# Patient Record
Sex: Male | Born: 1955 | Race: Black or African American | Hispanic: No | Marital: Married | Smoking: Never smoker
Health system: Southern US, Community
[De-identification: ages and names within clinical notes are randomized; demographics above are authoritative.]

## PROBLEM LIST (undated history)

## (undated) DIAGNOSIS — M1990 Unspecified osteoarthritis, unspecified site: Secondary | ICD-10-CM

## (undated) DIAGNOSIS — Z973 Presence of spectacles and contact lenses: Secondary | ICD-10-CM

## (undated) DIAGNOSIS — M4802 Spinal stenosis, cervical region: Secondary | ICD-10-CM

## (undated) DIAGNOSIS — C801 Malignant (primary) neoplasm, unspecified: Secondary | ICD-10-CM

## (undated) HISTORY — PX: BACK SURGERY: SHX140

## (undated) HISTORY — PX: HERNIA REPAIR: SHX51

## (undated) HISTORY — PX: ROTATOR CUFF REPAIR: SHX139

## (undated) HISTORY — PX: EYE SURGERY: SHX253

## (undated) HISTORY — PX: APPENDECTOMY: SHX54

---

## 2006-02-10 DIAGNOSIS — N529 Male erectile dysfunction, unspecified: Secondary | ICD-10-CM | POA: Insufficient documentation

## 2008-06-10 DIAGNOSIS — Z136 Encounter for screening for cardiovascular disorders: Secondary | ICD-10-CM | POA: Insufficient documentation

## 2009-03-28 DIAGNOSIS — N138 Other obstructive and reflux uropathy: Secondary | ICD-10-CM | POA: Insufficient documentation

## 2012-02-16 DIAGNOSIS — Z8042 Family history of malignant neoplasm of prostate: Secondary | ICD-10-CM | POA: Insufficient documentation

## 2012-02-16 DIAGNOSIS — E7800 Pure hypercholesterolemia, unspecified: Secondary | ICD-10-CM | POA: Insufficient documentation

## 2015-05-14 DIAGNOSIS — H40039 Anatomical narrow angle, unspecified eye: Secondary | ICD-10-CM | POA: Insufficient documentation

## 2016-08-17 DIAGNOSIS — C9000 Multiple myeloma not having achieved remission: Secondary | ICD-10-CM | POA: Insufficient documentation

## 2016-08-17 DIAGNOSIS — C9001 Multiple myeloma in remission: Secondary | ICD-10-CM | POA: Insufficient documentation

## 2016-08-24 DIAGNOSIS — G9520 Unspecified cord compression: Secondary | ICD-10-CM | POA: Insufficient documentation

## 2016-08-26 DIAGNOSIS — Z981 Arthrodesis status: Secondary | ICD-10-CM | POA: Insufficient documentation

## 2016-09-01 DIAGNOSIS — M4714 Other spondylosis with myelopathy, thoracic region: Secondary | ICD-10-CM | POA: Insufficient documentation

## 2017-02-05 DIAGNOSIS — Z9484 Stem cells transplant status: Secondary | ICD-10-CM | POA: Insufficient documentation

## 2017-05-08 ENCOUNTER — Other Ambulatory Visit: Payer: Self-pay

## 2017-05-08 ENCOUNTER — Encounter (HOSPITAL_COMMUNITY): Payer: Self-pay

## 2017-05-08 ENCOUNTER — Emergency Department (HOSPITAL_COMMUNITY)
Admission: EM | Admit: 2017-05-08 | Discharge: 2017-05-08 | Disposition: A | Discharge status: Home / Self Care | Payer: 59 | Attending: Emergency Medicine | Admitting: Emergency Medicine

## 2017-05-08 DIAGNOSIS — Z8579 Personal history of other malignant neoplasms of lymphoid, hematopoietic and related tissues: Secondary | ICD-10-CM | POA: Diagnosis not present

## 2017-05-08 DIAGNOSIS — R1084 Generalized abdominal pain: Secondary | ICD-10-CM

## 2017-05-08 DIAGNOSIS — R112 Nausea with vomiting, unspecified: Secondary | ICD-10-CM | POA: Insufficient documentation

## 2017-05-08 DIAGNOSIS — Z9484 Stem cells transplant status: Secondary | ICD-10-CM | POA: Insufficient documentation

## 2017-05-08 HISTORY — DX: Malignant (primary) neoplasm, unspecified: C80.1

## 2017-05-08 LAB — COMPREHENSIVE METABOLIC PANEL
ALBUMIN: 4 g/dL (ref 3.5–5.0)
ALK PHOS: 46 U/L (ref 38–126)
ALT: 13 U/L — AB (ref 17–63)
AST: 18 U/L (ref 15–41)
Anion gap: 6 (ref 5–15)
BILIRUBIN TOTAL: 0.6 mg/dL (ref 0.3–1.2)
BUN: 8 mg/dL (ref 6–20)
CALCIUM: 9.3 mg/dL (ref 8.9–10.3)
CO2: 27 mmol/L (ref 22–32)
CREATININE: 0.57 mg/dL — AB (ref 0.61–1.24)
Chloride: 105 mmol/L (ref 101–111)
GFR calc non Af Amer: >60 mL/min (ref >60)
GLUCOSE: 111 mg/dL — AB (ref 65–99)
Potassium: 4.4 mmol/L (ref 3.5–5.1)
SODIUM: 138 mmol/L (ref 135–145)
TOTAL PROTEIN: 6.4 g/dL — AB (ref 6.5–8.1)

## 2017-05-08 LAB — CBC
HCT: 34.5 % — ABNORMAL LOW (ref 39.0–52.0)
Hemoglobin: 11.3 g/dL — ABNORMAL LOW (ref 13.0–17.0)
MCH: 27.7 pg (ref 26.0–34.0)
MCHC: 32.8 g/dL (ref 30.0–36.0)
MCV: 84.6 fL (ref 78.0–100.0)
PLATELETS: 204 10*3/uL (ref 150–400)
RBC: 4.08 MIL/uL — ABNORMAL LOW (ref 4.22–5.81)
RDW: 16.2 % — ABNORMAL HIGH (ref 11.5–15.5)
WBC: 4.2 10*3/uL (ref 4.0–10.5)

## 2017-05-08 LAB — URINALYSIS, ROUTINE W REFLEX MICROSCOPIC
Bacteria, UA: NONE SEEN
Bilirubin Urine: NEGATIVE
GLUCOSE, UA: NEGATIVE mg/dL
Hgb urine dipstick: NEGATIVE
Ketones, ur: NEGATIVE mg/dL
Leukocytes, UA: NEGATIVE
Nitrite: NEGATIVE
PH: 7 (ref 5.0–8.0)
PROTEIN: NEGATIVE mg/dL
SPECIFIC GRAVITY, URINE: 1.01 (ref 1.005–1.030)
Squamous Epithelial / LPF: NONE SEEN

## 2017-05-08 LAB — LIPASE, BLOOD: Lipase: 23 U/L (ref 11–51)

## 2017-05-08 MED ORDER — ONDANSETRON HCL 4 MG/2ML IJ SOLN
4.0000 mg | Freq: Once | INTRAMUSCULAR | Status: AC
  Administered 2017-05-08: 4 mg via INTRAVENOUS
  Filled 2017-05-08: qty 2

## 2017-05-08 MED ORDER — ONDANSETRON HCL 4 MG PO TABS: 4.0000 mg | ORAL_TABLET | Freq: Three times a day (TID) | ORAL | 0 refills | Status: DC | PRN

## 2017-05-08 MED ORDER — SODIUM CHLORIDE 0.9 % IV SOLN
INTRAVENOUS | Status: DC
  Administered 2017-05-08: 21:00:00 via INTRAVENOUS

## 2017-05-08 NOTE — ED Notes (Addendum)
Pt was given ice water for fluid challenge. Tolerated well. Denies N/V. Provider notified.

## 2017-05-08 NOTE — ED Triage Notes (Signed)
Patient c/o mid abdominal pain and vomiting since 1300 today. Patient has a history of multiple myeloma. Patient denies any diarrhea.

## 2017-05-08 NOTE — ED Provider Notes (Signed)
[facility] DEPT Provider Note   CSN: [id number] Arrival date & time: 05/08/17  6803     History   Chief Complaint Chief Complaint  Patient presents with  . Abdominal Pain  . Emesis    HPI   Blood pressure 126/81, pulse 65, temperature 98.6 F (37 C), temperature source Oral, resp. rate 18, height 5' 7.5" (1.715 m), weight 53.5 kg (118 lb), SpO2 100 %.  Jesus [name] is a 62 y.o. 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  No pain medications taken prior to arrival.  Past Medical History:  Diagnosis Date  . Cancer ([place])     There are no active problems to display for this patient.   Past Surgical History:  Procedure Laterality Date  . BACK SURGERY          Home Medications    Prior to Admission medications   Medication Sig Start Date End Date Taking? Authorizing Provider  ondansetron (ZOFRAN) 4 MG tablet Take 1 tablet (4 mg total) by mouth every 8 (eight) hours as needed for nausea or vomiting. 05/08/17   Jazon Jipson, Charna Elizabeth    Family History No family history on file.  Social History Social History   Tobacco Use  . Smoking status: Never Smoker  . Smokeless tobacco: Never Used  Substance Use Topics  . Alcohol use: Never    Frequency: Never  . Drug use: Never     Allergies   Penicillins   Review of Systems Review of Systems  A complete review of systems was obtained and all systems are negative except as noted in the HPI and PMH.   Physical Exam Updated Vital Signs BP 136/84 (BP Location: Left Arm)   Pulse 82   Temp 97.9 F (36.6 C) (Oral)   Resp 18   Ht 5' 7.5" (1.715 m)   Wt 53.5 kg (118 lb)   SpO2  100%   BMI 18.21 kg/m   Physical Exam  Constitutional: He is oriented to person, place, and time. He appears well-developed and well-nourished. No distress.  HENT:  Head: Normocephalic and atraumatic.  Mouth/Throat: Oropharynx is clear and moist.  Eyes: Pupils are equal, round, and reactive to light. Conjunctivae and EOM are normal.  Neck: Normal range of motion.  Cardiovascular: Normal rate, regular rhythm and intact distal pulses.  Pulmonary/Chest: Effort normal and breath sounds normal.  Abdominal: Soft. He exhibits no distension and no mass. There is no tenderness. There is no rebound and no guarding. No hernia.  No tenderness to deep palpation of any quadrant, no guarding or rebound.  Musculoskeletal: Normal range of motion.  Neurological: He is alert and oriented to person, place, and time.  Skin: He is not diaphoretic.  Psychiatric: He has a normal mood and affect.  Nursing note and vitals reviewed.    ED Treatments / Results  Labs (all labs ordered are listed, but only abnormal results are displayed) Labs Reviewed  COMPREHENSIVE METABOLIC PANEL - Abnormal; Notable for the following components:      Result Value   Glucose, Bld 111 (*)    Creatinine, Ser 0.57 (*)    Total Protein 6.4 (*)    ALT 13 (*)    All other components within normal limits  CBC - Abnormal; Notable for the following components:   RBC 4.08 (*)    Hemoglobin 11.3 (*)    HCT 34.5 (*)    RDW 16.2 (*)    All other components within normal limits  LIPASE, BLOOD  URINALYSIS, ROUTINE W REFLEX MICROSCOPIC    EKG None  Radiology No results found.  Procedures Procedures (including critical care time)  Medications Ordered in ED Medications  0.9 %  sodium chloride infusion ( Intravenous New Bag/Given 05/08/17 2051)  ondansetron (ZOFRAN) injection 4 mg (4 mg Intravenous Given 05/08/17 2051)     Initial Impression / Assessment and Plan / ED Course  I have reviewed the triage vital signs and the  nursing notes.  Pertinent labs & imaging results that were available during my care of the patient were reviewed by me and considered in my medical decision making (see chart for details).     Vitals:   05/08/17 1857 05/08/17 1901 05/08/17 2005 05/08/17 2128  BP: 126/81  133/82 136/84  Pulse: 65  83 82  Resp: _0 Temp: 98.6 F (37 C)   97.9 F (36.6 C)  TempSrc: Oral   Oral  SpO2: 100%  100% 100%  Weight:  53.5 kg (118 lb)    Height:  5' 7.5" (1.715 m)      Medications  0.9 %  sodium chloride infusion ( Intravenous New Bag/Given 05/08/17 2051)  ondansetron (ZOFRAN) injection 4 mg (4 mg Intravenous Given 05/08/17 2051)    Jesus [name] is 62 y.o. male presenting with acute onset of diffuse abdominal pain with single episode of emesis.  Patient is very worried because he underwent bone marrow transplant last year.  Abdominal exam is benign, patient afebrile, nontoxic-appearing, blood work reassuring, he is tolerating p.o.'s.  Repeat abdominal exam remains benign.  Evaluation does not show pathology that would require ongoing emergent intervention or inpatient treatment. Pt is hemodynamically stable and mentating appropriately. Discussed findings and plan with patient/guardian, who agrees with care plan. All questions answered. Return precautions discussed and outpatient follow up given.    Final Clinical Impressions(s) / ED Diagnoses   Final diagnoses:  Non-intractable vomiting with nausea, unspecified vomiting type  Generalized abdominal pain    ED Discharge Orders        Ordered    ondansetron (ZOFRAN) 4 MG tablet  Every 8 hours PRN     05/08/17 2129       Mackinsey Pelland, Charna Elizabeth 05/08/17 2132    Lajean Saver, MD 05/08/17 2356

## 2017-05-08 NOTE — Discharge Instructions (Addendum)

## 2018-04-19 ENCOUNTER — Telehealth: Payer: Self-pay | Admitting: Hematology

## 2018-04-19 NOTE — Progress Notes (Signed)
HEMATOLOGY/ONCOLOGY CONSULTATION NOTE  Date of Service: 04/20/2018  Patient Care Team: Patient, No Pcp Per as PCP - General (General Practice)  CHIEF COMPLAINTS/PURPOSE OF CONSULTATION:  Multiple Myeloma  Oncologic History:   Jesus [name] was diagnosed with multiple myeloma in July 2018 in [place]. He initially developed mid back pain in May 2018 and was seen to have a T10 vertebral body mass with additional lytic lesion at T9. His initial M spike in July 2018 was 1.5g with IgG Lambda specificity and IgG elevated at '2220mg'$ . His July 2018 BM Bx revealed 20% monoclonal plasma cells and FISH revealed a 17p deletion. He received 4 fractions of palliative RT, however his back pain worsened and developed bilateral lower extremity numbness and tingling, and was then seen to have a T10 pathologic fracture with cord compression s/p T10 corpectomy. The pt began VRd on 09/24/16, completed 7 cycles, then began autologous transplant at the [facility] with Day 0 on 02/23/17, post transplant complicated by neutropenic colitis and deconditioning. Repeat BM Bx on 04/21/17 revealed residual plasma cell with less than 5% lambda light chain restricted plasma cells, and a post-transplant M spike of 0.4g. The pt then began maintenance '56mg'$ /m2 Carfilzomib every 2 weeks on 06/16/17.  HISTORY OF PRESENTING ILLNESS:   Jesus [name] is a wonderful 63 y.o. male who has been referred to [place] by Dr. Phil Dopp for evaluation and management of Multiple Myeloma. The pt reports that he is doing well overall.  The pt has been receiving all of his care and treatment thus far in [place]. He works for [employer] and has been based out of [place], however due to the novel coronavirus, the pt has moved back to his home which is here locally, and is transferring his care here now as well. The pt notes that he is anticipating C11D1 maintenance Carfilzomib today. The pt notes that his most recent M spike was 0.2g. His  last BM biopsy was March 2019, as noted above. He denies any infection issues in the last year. The pt notes that he has received all of is post-transplant vaccinations. He last saw his transplant center, the [facility], in December 2019.  The pt reports that he continues to have back pain. He has been receiving Zometa every 4 weeks, but was recently transitioned to every 3 months, he denies dental concerns at this time. He is taking Calcium and Vitamin D replacement.   The pt has been taking Marinol for appetite stimulation.   The pt notes that he has an enlarged prostate, which was seen on imaging. He notes that he was expecting to receive a PSA test soon.  The pt notes that prior to his Multiple Myeloma diagnosis in July 2018, he had a "clean bill of health." He denies DM, HTN, lung problems, heart problems, thyroid problems or other medical concerns.  Most recent lab results (03/23/18) of CBC is as follows: all values are WNL except for RBC at 4.03, HGB at 11.5, HCT at 35.6%, MPV at 8.9, Glucose at 135, Total Protein at 6.2.  On review of systems, pt reports chronic back pain, stable energy levels, and denies concerns for infections, new pain along the spine, abdominal pains, leg swelling, and any other symptoms.   On PMHx the pt reports Multiple Myeloma, denies HTN, HLD, lung problems, strokes, seizures, heart problems and thyroid problems. On Social Hx the pt reports working for the [employer].  MEDICAL HISTORY:  Past Medical History:  Diagnosis Date  .  Cancer [facility])     SURGICAL HISTORY: Past Surgical History:  Procedure Laterality Date  . BACK SURGERY      SOCIAL HISTORY: Social History   Socioeconomic History  . Marital status: Single    Spouse name: Not on file  . Number of children: Not on file  . Years of education: Not on file  . Highest education level: Not on file  Occupational History  . Not on file  Social Needs  . Financial resource strain: Not on file   . Food insecurity:    Worry: Not on file    Inability: Not on file  . Transportation needs:    Medical: Not on file    Non-medical: Not on file  Tobacco Use  . Smoking status: Never Smoker  . Smokeless tobacco: Never Used  Substance and Sexual Activity  . Alcohol use: Never    Frequency: Never  . Drug use: Never  . Sexual activity: Not on file  Lifestyle  . Physical activity:    Days per week: Not on file    Minutes per session: Not on file  . Stress: Not on file  Relationships  . Social connections:    Talks on phone: Not on file    Gets together: Not on file    Attends religious service: Not on file    Active member of club or organization: Not on file    Attends meetings of clubs or organizations: Not on file    Relationship status: Not on file  . Intimate partner violence:    Fear of current or ex partner: Not on file    Emotionally abused: Not on file    Physically abused: Not on file    Forced sexual activity: Not on file  Other Topics Concern  . Not on file  Social History Narrative  . Not on file    FAMILY HISTORY: No family history on file.  ALLERGIES:  is allergic to penicillins.  MEDICATIONS:  Current Outpatient Medications  Medication Sig Dispense Refill  . ondansetron (ZOFRAN) 4 MG tablet Take 1 tablet (4 mg total) by mouth every 8 (eight) hours as needed for nausea or vomiting. 10 tablet 0   No current facility-administered medications for this visit.     REVIEW OF SYSTEMS:    10 Point review of Systems was done is negative except as noted above.  PHYSICAL EXAMINATION: ECOG PERFORMANCE STATUS: 1 - Symptomatic but completely ambulatory  . Vitals:   04/20/18 1018  BP: 138/85  Pulse: 75  Resp: 17  Temp: 98.6 F (37 C)  SpO2: 100%   Filed Weights   04/20/18 1018  Weight: 127 lb 11.2 oz (57.9 kg)   .Body mass index is 19.71 kg/m.  GENERAL:alert, in no acute distress and comfortable SKIN: no acute rashes, no significant lesions  EYES: conjunctiva are pink and non-injected, sclera anicteric OROPHARYNX: MMM, no exudates, no oropharyngeal erythema or ulceration NECK: supple, no JVD LYMPH:  no palpable lymphadenopathy in the cervical, axillary or inguinal regions LUNGS: clear to auscultation b/l with normal respiratory effort HEART: regular rate & rhythm ABDOMEN:  normoactive bowel sounds , non tender, not distended. Extremity: no pedal edema PSYCH: alert & oriented x 3 with fluent speech NEURO: no focal motor/sensory deficits  LABORATORY DATA:  I have reviewed the data as listed  . CBC Latest Ref Rng & Units 04/25/2018 04/20/2018 05/08/2017  WBC 4.0 - 10.5 K/uL 4.1 4.6 4.2  Hemoglobin 13.0 - 17.0 g/dL 11.6(L) 12.4(L)  11.3(L)  Hematocrit 39.0 - 52.0 % 36.9(L) 38.1(L) 34.5(L)  Platelets 150 - 400 K/uL 193 216 204    . CMP Latest Ref Rng & Units 04/25/2018 04/20/2018 05/08/2017  Glucose 70 - 99 mg/dL 105(H) 95 111(H)  BUN 8 - 23 mg/dL '14 12 8  '$ Creatinine 0.61 - 1.24 mg/dL 0.70 0.72 0.57(L)  Sodium 135 - 145 mmol/L 143 141 138  Potassium 3.5 - 5.1 mmol/L 3.9 4.1 4.4  Chloride 98 - 111 mmol/L 107 105 105  CO2 22 - 32 mmol/L '26 26 27  '$ Calcium 8.9 - 10.3 mg/dL 9.4 9.4 9.3  Total Protein 6.5 - 8.1 g/dL 6.7 7.3 6.4(L)  Total Bilirubin 0.3 - 1.2 mg/dL 0.5 0.9 0.6  Alkaline Phos 38 - 126 U/L 62 78 46  AST 15 - 41 U/L 14(L) 15 18  ALT 0 - 44 U/L 19 22 13(L)     RADIOGRAPHIC STUDIES: I have personally reviewed the radiological images as listed and agreed with the findings in the report. No results found.  ASSESSMENT & PLAN:  63 y.o. male with  1. 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 IgG at 2220. 08/11/16 PET/CT revealed Hypermetabolic large soft tissue mass in the lower thoracic paraspinal region associated with lytic  destruction of T10 vertebral body, concerning for multiple myeloma. Additional smaller lytic lesions involving the skeleton. S/p RT x 4 fractions, discontinued due to T10 pathologic fracture with severe cord compression S/p 08/26/16 T10 corpectomy and T8-L1 posterior spinal fusion   Began 7 cycles of VRd on 09/24/16 S/p autologous stem cell transplant, Day 0 on 02/23/17 04/21/17 BM Bx with residual less than 5% lambda light chain restricted plasma cells in the bone marrow. M Spike at 0.4g Began maintenance '56mg'$ /m2 Carfilzomib every 2 weeks on 515/19  PLAN: -Discussed patient's most recent labs from 03/23/18, some mild anemia with HGB at 11.5 -Will plan to begin C11 maintenance Carfilzomib treatment as soon as this is approved, tentatively within one week -Discussed that I recommend the patient establishing care with a transplant center locally, in case he does not return to [place] soon, and would recommend Fed[place] continue Zometa every 3 months, beginning in late April, when the pt's next dose is due -Advised that pt establish care with a PCP locally -Will order labs today -Will see the pt back in 4-5 weeks   Labs today Carfilzomib q2weeks to start ASAP - plz schedule 4 doses Zometa q14month to start in 4 weeks RTC with Dr KIrene Limbowith 2nd dose of Carfilzomib Labs with each dose of Carfilzomib   All of the patients questions were answered with apparent satisfaction. The patient knows to call the clinic with any problems, questions or concerns.  The total time spent in the appt was 60 minutes and more than 50% was on counseling and direct patient cares.    GSullivan LoneMD MS AAHIVMS SEndoscopy Center Of Washington Dc LPCTri City Surgery Center LLCHematology/Oncology Physician CEncompass Health Rehabilitation [facility] Chattano[facility] (Office):       [phone](Work cell):  [phone](Fax):           [phone] 04/20/2018 11:34 AM  I, SBaldwin Jamaica am acting as a scribe for Dr. GSullivan Lone   .I have reviewed the above  documentation for accuracy and completeness, and I agree with the above. .[name]  Brunetta Genera MD

## 2018-04-19 NOTE — Telephone Encounter (Signed)
A new patient appt has been schedulef for the pt to see Dr. Irene Limbo tomorrow, 3/18 at 1020am. Jesus [name] agreed to the appt date and time.

## 2018-04-20 ENCOUNTER — Other Ambulatory Visit: Payer: Self-pay | Admitting: *Deleted

## 2018-04-20 ENCOUNTER — Inpatient Hospital Stay: Payer: 59 | Attending: Hematology | Admitting: Hematology

## 2018-04-20 ENCOUNTER — Telehealth: Payer: Self-pay | Admitting: Hematology

## 2018-04-20 ENCOUNTER — Inpatient Hospital Stay: Payer: 59

## 2018-04-20 ENCOUNTER — Other Ambulatory Visit: Payer: Self-pay

## 2018-04-20 VITALS — BP 138/85 | HR 75 | Temp 98.6°F | Resp 17 | Ht 67.5 in | Wt 127.7 lb

## 2018-04-20 DIAGNOSIS — Z5112 Encounter for antineoplastic immunotherapy: Secondary | ICD-10-CM | POA: Diagnosis present

## 2018-04-20 DIAGNOSIS — D649 Anemia, unspecified: Secondary | ICD-10-CM | POA: Diagnosis not present

## 2018-04-20 DIAGNOSIS — C9000 Multiple myeloma not having achieved remission: Secondary | ICD-10-CM | POA: Insufficient documentation

## 2018-04-20 DIAGNOSIS — G8929 Other chronic pain: Secondary | ICD-10-CM | POA: Diagnosis not present

## 2018-04-20 DIAGNOSIS — Z79899 Other long term (current) drug therapy: Secondary | ICD-10-CM | POA: Insufficient documentation

## 2018-04-20 DIAGNOSIS — N400 Enlarged prostate without lower urinary tract symptoms: Secondary | ICD-10-CM

## 2018-04-20 DIAGNOSIS — Z7189 Other specified counseling: Secondary | ICD-10-CM | POA: Insufficient documentation

## 2018-04-20 LAB — CBC WITH DIFFERENTIAL/PLATELET
ABS IMMATURE GRANULOCYTES: 0.02 10*3/uL (ref 0.00–0.07)
Basophils Absolute: 0 10*3/uL (ref 0.0–0.1)
Basophils Relative: 0 %
EOS PCT: 1 %
Eosinophils Absolute: 0.1 10*3/uL (ref 0.0–0.5)
HCT: 38.1 % — ABNORMAL LOW (ref 39.0–52.0)
HEMOGLOBIN: 12.4 g/dL — AB (ref 13.0–17.0)
Immature Granulocytes: 0 %
Lymphocytes Relative: 34 %
Lymphs Abs: 1.6 10*3/uL (ref 0.7–4.0)
MCH: 29.5 pg (ref 26.0–34.0)
MCHC: 32.5 g/dL (ref 30.0–36.0)
MCV: 90.7 fL (ref 80.0–100.0)
MONO ABS: 0.5 10*3/uL (ref 0.1–1.0)
MONOS PCT: 11 %
NEUTROS ABS: 2.5 10*3/uL (ref 1.7–7.7)
Neutrophils Relative %: 54 %
Platelets: 216 10*3/uL (ref 150–400)
RBC: 4.2 MIL/uL — ABNORMAL LOW (ref 4.22–5.81)
RDW: 15.7 % — ABNORMAL HIGH (ref 11.5–15.5)
WBC: 4.6 10*3/uL (ref 4.0–10.5)
nRBC: 0 % (ref 0.0–0.2)

## 2018-04-20 LAB — CMP (CANCER CENTER ONLY)
ALBUMIN: 4.5 g/dL (ref 3.5–5.0)
ALK PHOS: 78 U/L (ref 38–126)
ALT: 22 U/L (ref 0–44)
AST: 15 U/L (ref 15–41)
Anion gap: 10 (ref 5–15)
BILIRUBIN TOTAL: 0.9 mg/dL (ref 0.3–1.2)
BUN: 12 mg/dL (ref 8–23)
CALCIUM: 9.4 mg/dL (ref 8.9–10.3)
CO2: 26 mmol/L (ref 22–32)
CREATININE: 0.72 mg/dL (ref 0.61–1.24)
Chloride: 105 mmol/L (ref 98–111)
GFR, Est AFR Am: >60 mL/min (ref >60)
GFR, Estimated: >60 mL/min (ref >60)
GLUCOSE: 95 mg/dL (ref 70–99)
Potassium: 4.1 mmol/L (ref 3.5–5.1)
Sodium: 141 mmol/L (ref 135–145)
TOTAL PROTEIN: 7.3 g/dL (ref 6.5–8.1)

## 2018-04-20 LAB — SAMPLE TO BLOOD BANK

## 2018-04-20 LAB — SEDIMENTATION RATE: Sed Rate: 8 mm/hr (ref 0–16)

## 2018-04-20 MED ORDER — ONDANSETRON HCL 4 MG PO TABS: 4.0000 mg | ORAL_TABLET | Freq: Three times a day (TID) | ORAL | 0 refills | Status: DC | PRN

## 2018-04-20 NOTE — Progress Notes (Signed)
START OFF PATHWAY REGIMEN - Multiple Myeloma and Other Plasma Cell Dyscrasias   [id number]:Carfilzomib 20/56 mg/m2 Monotherapy q28 Days:   A cycle is every 28 days:     Carfilzomib      Carfilzomib      Carfilzomib      Carfilzomib   **Always confirm dose/schedule in your pharmacy ordering system**  Patient Characteristics: Maintenance Therapy R-ISS Staging: III Disease Classification: Maintenance Therapy Intent of Therapy: Non-Curative / Palliative Intent, Discussed with Patient

## 2018-04-20 NOTE — Telephone Encounter (Signed)
Scheduled appt per 3/18 los. ° °Printed calendar and avs. °

## 2018-04-21 LAB — MULTIPLE MYELOMA PANEL, SERUM
ALBUMIN/GLOB SERPL: 1.7 (ref 0.7–1.7)
Albumin SerPl Elph-Mcnc: 4.2 g/dL (ref 2.9–4.4)
Alpha 1: 0.3 g/dL (ref 0.0–0.4)
Alpha2 Glob SerPl Elph-Mcnc: 0.6 g/dL (ref 0.4–1.0)
B-Globulin SerPl Elph-Mcnc: 1.1 g/dL (ref 0.7–1.3)
GAMMA GLOB SERPL ELPH-MCNC: 0.7 g/dL (ref 0.4–1.8)
GLOBULIN, TOTAL: 2.6 g/dL (ref 2.2–3.9)
IGA: 60 mg/dL — AB (ref 61–437)
IgG (Immunoglobin G), Serum: 712 mg/dL (ref 700–1600)
IgM (Immunoglobulin M), Srm: 83 mg/dL (ref 20–172)
M Protein SerPl Elph-Mcnc: 0.2 g/dL — ABNORMAL HIGH
Total Protein ELP: 6.8 g/dL (ref 6.0–8.5)

## 2018-04-21 LAB — KAPPA/LAMBDA LIGHT CHAINS
KAPPA FREE LGHT CHN: 10.2 mg/L (ref 3.3–19.4)
KAPPA, LAMDA LIGHT CHAIN RATIO: 1.1 (ref 0.26–1.65)
LAMDA FREE LIGHT CHAINS: 9.3 mg/L (ref 5.7–26.3)

## 2018-04-21 LAB — VITAMIN D 25 HYDROXY (VIT D DEFICIENCY, FRACTURES): VIT D 25 HYDROXY: 22.1 ng/mL — AB (ref 30.0–100.0)

## 2018-04-22 ENCOUNTER — Other Ambulatory Visit: Payer: Self-pay

## 2018-04-22 DIAGNOSIS — C9000 Multiple myeloma not having achieved remission: Secondary | ICD-10-CM

## 2018-04-25 ENCOUNTER — Inpatient Hospital Stay: Payer: 59

## 2018-04-25 ENCOUNTER — Other Ambulatory Visit: Payer: Self-pay

## 2018-04-25 VITALS — BP 136/76 | HR 92 | Temp 98.7°F | Resp 17

## 2018-04-25 DIAGNOSIS — Z7189 Other specified counseling: Secondary | ICD-10-CM

## 2018-04-25 DIAGNOSIS — C9000 Multiple myeloma not having achieved remission: Secondary | ICD-10-CM

## 2018-04-25 DIAGNOSIS — Z5112 Encounter for antineoplastic immunotherapy: Secondary | ICD-10-CM | POA: Diagnosis not present

## 2018-04-25 LAB — CMP (CANCER CENTER ONLY)
ALK PHOS: 62 U/L (ref 38–126)
ALT: 19 U/L (ref 0–44)
AST: 14 U/L — ABNORMAL LOW (ref 15–41)
Albumin: 4.1 g/dL (ref 3.5–5.0)
Anion gap: 10 (ref 5–15)
BUN: 14 mg/dL (ref 8–23)
CO2: 26 mmol/L (ref 22–32)
Calcium: 9.4 mg/dL (ref 8.9–10.3)
Chloride: 107 mmol/L (ref 98–111)
Creatinine: 0.7 mg/dL (ref 0.61–1.24)
GFR, Est AFR Am: >60 mL/min (ref >60)
GFR, Estimated: >60 mL/min (ref >60)
Glucose, Bld: 105 mg/dL — ABNORMAL HIGH (ref 70–99)
Potassium: 3.9 mmol/L (ref 3.5–5.1)
Sodium: 143 mmol/L (ref 135–145)
Total Bilirubin: 0.5 mg/dL (ref 0.3–1.2)
Total Protein: 6.7 g/dL (ref 6.5–8.1)

## 2018-04-25 LAB — CBC WITH DIFFERENTIAL (CANCER CENTER ONLY)
Abs Immature Granulocytes: 0.01 10*3/uL (ref 0.00–0.07)
Basophils Absolute: 0 10*3/uL (ref 0.0–0.1)
Basophils Relative: 1 %
Eosinophils Absolute: 0.1 10*3/uL (ref 0.0–0.5)
Eosinophils Relative: 1 %
HCT: 36.9 % — ABNORMAL LOW (ref 39.0–52.0)
Hemoglobin: 11.6 g/dL — ABNORMAL LOW (ref 13.0–17.0)
Immature Granulocytes: 0 %
LYMPHS PCT: 36 %
Lymphs Abs: 1.5 10*3/uL (ref 0.7–4.0)
MCH: 29.2 pg (ref 26.0–34.0)
MCHC: 31.4 g/dL (ref 30.0–36.0)
MCV: 92.9 fL (ref 80.0–100.0)
Monocytes Absolute: 0.5 10*3/uL (ref 0.1–1.0)
Monocytes Relative: 13 %
Neutro Abs: 2 10*3/uL (ref 1.7–7.7)
Neutrophils Relative %: 49 %
Platelet Count: 193 10*3/uL (ref 150–400)
RBC: 3.97 MIL/uL — ABNORMAL LOW (ref 4.22–5.81)
RDW: 15.6 % — ABNORMAL HIGH (ref 11.5–15.5)
WBC Count: 4.1 10*3/uL (ref 4.0–10.5)
nRBC: 0 % (ref 0.0–0.2)

## 2018-04-25 MED ORDER — SODIUM CHLORIDE 0.9% FLUSH
10.0000 mL | INTRAVENOUS | Status: DC | PRN
  Filled 2018-04-25: qty 10

## 2018-04-25 MED ORDER — DEXTROSE 5 % IV SOLN
90.0000 mg | Freq: Once | INTRAVENOUS | Status: AC
  Administered 2018-04-25: 90 mg via INTRAVENOUS
  Filled 2018-04-25: qty 15

## 2018-04-25 MED ORDER — ZOLEDRONIC ACID 4 MG/100ML IV SOLN: 4.0000 mg | Freq: Once | INTRAVENOUS | Status: DC

## 2018-04-25 MED ORDER — HEPARIN SOD (PORK) LOCK FLUSH 100 UNIT/ML IV SOLN
500.0000 [IU] | Freq: Once | INTRAVENOUS | Status: DC | PRN
  Filled 2018-04-25: qty 5

## 2018-04-25 MED ORDER — PROCHLORPERAZINE MALEATE 10 MG PO TABS
10.0000 mg | ORAL_TABLET | Freq: Once | ORAL | Status: AC
  Administered 2018-04-25: 10 mg via ORAL

## 2018-04-25 MED ORDER — SODIUM CHLORIDE 0.9 % IV SOLN
Freq: Once | INTRAVENOUS | Status: AC
  Administered 2018-04-25: 09:00:00 via INTRAVENOUS
  Filled 2018-04-25: qty 250

## 2018-04-25 MED ORDER — SODIUM CHLORIDE 0.9 % IV SOLN
Freq: Once | INTRAVENOUS | Status: DC
  Filled 2018-04-25: qty 250

## 2018-04-25 MED ORDER — PROCHLORPERAZINE MALEATE 10 MG PO TABS
ORAL_TABLET | ORAL | Status: AC
  Filled 2018-04-25: qty 1

## 2018-04-25 NOTE — Patient Instructions (Addendum)
Coronavirus (COVID-19) Are you at risk?  Are you at risk for the Coronavirus (COVID-19)?  To be considered HIGH RISK for Coronavirus (COVID-19), you have to meet the following criteria:  . Traveled to [place], [place], [place], [place] or [place]; or in the [place] to [place], [place], [place], or [place]; and have fever, cough, and shortness of breath within the last 2 weeks of travel OR . Been in close contact with a person diagnosed with COVID-19 within the last 2 weeks and have fever, cough, and shortness of breath . IF YOU DO NOT MEET THESE CRITERIA, YOU ARE CONSIDERED LOW RISK FOR COVID-19.  What to do if you are HIGH RISK for COVID-19?  . If you are having a medical emergency, call 911. . Seek medical care right away. Before you go to a doctor's office, urgent care or emergency department, call ahead and tell them about your recent travel, contact with someone diagnosed with COVID-19, and your symptoms. You should receive instructions from your physician's office regarding next steps of care.  . When you arrive at healthcare provider, tell the healthcare staff immediately you have returned from visiting [place], [place], [place], [place] or [place]; or traveled in the [place] to [place], [place], [place], or [place]; in the last two weeks or you have been in close contact with a person diagnosed with COVID-19 in the last 2 weeks.   . Tell the health care staff about your symptoms: fever, cough and shortness of breath. . After you have been seen by a medical provider, you will be either: o Tested for (COVID-19) and discharged home on quarantine except to seek medical care if symptoms worsen, and asked to  - Stay home and avoid contact with others until you get your results (4-5 days)  - Avoid travel on public transportation if possible (such as bus, train, or airplane) or o Sent to the Emergency Department by EMS for evaluation, COVID-19 testing, and possible  admission depending on your condition and test results.  What to do if you are LOW RISK for COVID-19?  Reduce your risk of any infection by using the same precautions used for avoiding the common cold or flu:  . Wash your hands often with soap and warm water for at least 20 seconds.  If soap and water are not readily available, use an alcohol-based hand sanitizer with at least 60% alcohol.  . If coughing or sneezing, cover your mouth and nose by coughing or sneezing into the elbow areas of your shirt or coat, into a tissue or into your sleeve (not your hands). . Avoid shaking hands with others and consider head nods or verbal greetings only. . Avoid touching your eyes, nose, or mouth with unwashed hands.  . Avoid close contact with people who are sick. . Avoid places or events with large numbers of people in one location, like concerts or sporting events. . Carefully consider travel plans you have or are making. . If you are planning any travel outside or inside the [place], visit the CDC's Travelers' Health webpage for the latest health notices. . If you have some symptoms but not all symptoms, continue to monitor at home and seek medical attention if your symptoms worsen. . If you are having a medical emergency, call 911.   ADDITIONAL HEALTHCARE OPTIONS FOR PATIENTS  [facility] / e-Visit: [email or web address]         [facility]: [phone]  [place]   Urgent Care: [facility]: [place] Discharge Instructions for Patients Receiving Chemotherapy  Today you received the following chemotherapy agents:  carfilzomib (Kyprolis).  To help prevent nausea and vomiting after your treatment, we encourage you to take your nausea medication as prescribed by your physician.    If you develop nausea and vomiting that is not controlled by your nausea  medication, call the clinic.   BELOW ARE SYMPTOMS THAT SHOULD BE REPORTED IMMEDIATELY:  *FEVER GREATER THAN 100.5 F  *CHILLS WITH OR WITHOUT FEVER  NAUSEA AND VOMITING THAT IS NOT CONTROLLED WITH YOUR NAUSEA MEDICATION  *UNUSUAL SHORTNESS OF BREATH  *UNUSUAL BRUISING OR BLEEDING  TENDERNESS IN MOUTH AND THROAT WITH OR WITHOUT PRESENCE OF ULCERS  *URINARY PROBLEMS  *BOWEL PROBLEMS  UNUSUAL RASH Items with * indicate a potential emergency and should be followed up as soon as possible.  Feel free to call the clinic should you have any questions or concerns. The clinic phone number is [phone].  Please show the [place] at check-in to the Emergency Department and triage nurse.  Carfilzomib injection What is this medicine? CARFILZOMIB (kar FILZ oh mib) targets a specific protein within cancer cells and stops the cancer cells from growing. It is used to treat multiple myeloma. This medicine may be used for other purposes; ask your health care provider or pharmacist if you have questions. COMMON BRAND NAME(S): KYPROLIS What should I tell my health care provider before I take this medicine? They need to know if you have any of these conditions: -heart disease -history of blood clots -irregular heartbeat -kidney disease -liver disease -lung or breathing disease -an unusual or allergic reaction to carfilzomib, or other medicines, foods, dyes, or preservatives -pregnant or trying to get pregnant -breast-feeding How should I use this medicine? This medicine is for injection or infusion into a vein. It is given by a health care professional in a hospital or clinic setting. Talk to your pediatrician regarding the use of this medicine in children. Special care may be needed. Overdosage: If you think you have taken too much of this medicine contact a poison control center or emergency room at once. NOTE: This medicine is only for you. Do not share this medicine with  others. What if I miss a dose? It is important not to miss your dose. Call your doctor or health care professional if you are unable to keep an appointment. What may interact with this medicine? Interactions are not expected. Give your health care provider a list of all the medicines, herbs, non-prescription drugs, or dietary supplements you use. Also tell them if you smoke, drink alcohol, or use illegal drugs. Some items may interact with your medicine. This list may not describe all possible interactions. Give your health care provider a list of all the medicines, herbs, non-prescription drugs, or dietary supplements you use. Also tell them if you smoke, drink alcohol, or use illegal drugs. Some items may interact with your medicine. What should I watch for while using this medicine? Your condition will be monitored carefully while you are receiving this medicine. Report any side effects. Continue your course of treatment even though you feel ill unless your doctor tells you to stop. You may need blood work done while you are taking this medicine. Do not become pregnant while taking this medicine or for at  least 6 months after stopping it. Women should inform their doctor if they wish to become pregnant or think they might be pregnant. There is a potential for serious side effects to an unborn child. Men should not father a child while taking this medicine and for at least 3 months after stopping it. Talk to your health care professional or pharmacist for more information. Do not breast-feed an infant while taking this medicine or for 2 weeks after the last dose. Check with your doctor or health care professional if you get an attack of severe diarrhea, nausea and vomiting, or if you sweat a lot. The loss of too much body fluid can make it dangerous for you to take this medicine. You may get dizzy. Do not drive, use machinery, or do anything that needs mental alertness until you know how this medicine  affects you. Do not stand or sit up quickly, especially if you are an older patient. This reduces the risk of dizzy or fainting spells. What side effects may I notice from receiving this medicine? Side effects that you should report to your doctor or health care professional as soon as possible: -allergic reactions like skin rash, itching or hives, swelling of the face, lips, or tongue -confusion -dizziness -feeling faint or lightheaded -fever or chills -palpitations -seizures -signs and symptoms of bleeding such as bloody or black, tarry stools; red or dark-brown urine; spitting up blood or brown material that looks like coffee grounds; red spots on the skin; unusual bruising or bleeding including from the eye, gums, or nose -signs and symptoms of a blood clot such as breathing problems; changes in vision; chest pain; severe, sudden headache; pain, swelling, warmth in the leg; trouble speaking; sudden numbness or weakness of the face, arm or leg -signs and symptoms of kidney injury like trouble passing urine or change in the amount of urine -signs and symptoms of liver injury like dark yellow or brown urine; general ill feeling or flu-like symptoms; light-colored stools; loss of appetite; nausea; right upper belly pain; unusually weak or tired; yellowing of the eyes or skin Side effects that usually do not require medical attention (report to your doctor or health care professional if they continue or are bothersome): -back pain -cough -diarrhea -headache -muscle cramps -vomiting This list may not describe all possible side effects. Call your doctor for medical advice about side effects. You may report side effects to FDA at 1-800-FDA-1088. Where should I keep my medicine? This drug is given in a hospital or clinic and will not be stored at home. NOTE: This sheet is a summary. It may not cover all possible information. If you have questions about this medicine, talk to your doctor,  pharmacist, or health care provider.  2019 Elsevier/Gold Standard (2016-11-04 14:07:13)

## 2018-04-28 ENCOUNTER — Ambulatory Visit: Payer: 59 | Admitting: Hematology

## 2018-05-06 NOTE — Progress Notes (Signed)
HEMATOLOGY/ONCOLOGY CONSULTATION NOTE  Date of Service: 05/09/2018  Patient Care Team: Patient, No Pcp Per as PCP - General (General Practice)  CHIEF COMPLAINTS/PURPOSE OF CONSULTATION:  Multiple Myeloma  Oncologic History:   Jesus [name] was diagnosed with multiple myeloma in July 2018 in [place]. He initially developed mid back pain in May 2018 and was seen to have a T10 vertebral body mass with additional lytic lesion at T9. His initial M spike in July 2018 was 1.5g with IgG Lambda specificity and IgG elevated at 221m. His July 2018 BM Bx revealed 20% monoclonal plasma cells and FISH revealed a 17p deletion. He received 4 fractions of palliative RT, however his back pain worsened and developed bilateral lower extremity numbness and tingling, and was then seen to have a T10 pathologic fracture with cord compression s/p T10 corpectomy. The pt began VRd on 09/24/16, completed 7 cycles, [facility], post transplant complicated by neutropenic colitis and deconditioning. Repeat BM Bx on 04/21/17 revealed residual plasma cell with less than 5% lambda light chain restricted plasma cells, and a post-transplant M spike of 0.4g. The pt then began maintenance 519mm2 Carfilzomib every 2 weeks on 06/16/17.  HISTORY OF PRESENTING ILLNESS:   Jesus [name] a wonderful [age].o. male who has been referred to usK[place]ay Dr. BePhil Doppor evaluation and management of Multiple Myeloma. The pt reports that he is doing well overall.  The [place], the pt has moved back to his home which is here locally, and is transferring his care here now as well. The pt notes that he is anticipating C11D1 maintenance Carfilzomib today. The pt notes that his most recent M spike was 0.2g. His  last BM biopsy was March 2019, as noted above. He denies any infection issues in the last year. The pt notes that he has received all of is post-transplant vaccinations. [name]  The pt reports that he continues to have back pain. He has been receiving Zometa every 4 weeks, but was recently transitioned to every 3 months, he denies dental concerns at this time. He is taking Calcium and Vitamin D replacement.   The pt has been taking Marinol for appetite stimulation.   The pt notes that he has an enlarged prostate, which was seen on imaging. He notes that he was expecting to receive a PSA test soon.  The pt notes that prior to his Multiple Myeloma diagnosis in July 2018, he had a "clean bill of health." He denies DM, HTN, lung problems, heart problems, thyroid problems or other medical concerns.  Most recent lab results (03/23/18) of CBC is as follows: all values are WNL except for RBC at 4.03, HGB at 11.5, HCT at 35.6%, MPV at 8.9, Glucose at 135, Total Protein at 6.2.  On review of systems, pt reports chronic back pain, stable energy levels, and denies concerns for infections, new pain along the spine, abdominal pains, leg swelling, and any other symptoms.   On PMHx the pt reports Multiple Myeloma, denies HTN, HLD, lung problems, strokes, seizures, heart problems and thyroid problems. On Social Hx the pt reports working for the [employer] Interval History:   Jesus Hearneeturns today for  management and evaluation of his multiple myeloma not in remission, and his maintenance Carfilzomib treatment. The patient's last visit with [place] was on 04/20/18. The pt reports that he is doing well overall.  The pt reports that he had his annual check up at [name] in December, at which time his left hip hurt and he "could barely pick his foot up." He notes that his left leg is painful to lift, with pain in the front part of his  hip. He denies having weakness as such. He notes that this has been explained to him as being due to arthritis. The pt denies any tingling or numbness in his hands or feet. The pt has been using Tramadol for his left hip pain. He notes that he has also had a small lump in his left groin which has been there for 10-15 years, denies any associated pain. He notes that this was described to him previously as being fatty tissue. He notes that this was evaluated with an MRI in the last 1-2 years.  The pt denies any problems tolerating Carfilzomib, however he notes that he had chills about 4 hours after his most recent infusion. He measured his temperature and notes that he did not have a fever. The pt denies night sweats. He denies any mouth sores. The pt notes that he is eating well for the most part.  Lab results today (05/09/18) of CBC w/diff is as follows: all values are WNL except for RBC at 3.87, HGB at 11.5, HCT at 35.6, RDW at 15.6. 05/09/18 CMP is stable  On review of systems, pt reports stable energy levels, left hip pain, eating well, one episode of chills, and denies night sweats, mouth sores, tingling or numbness in hands or feet, and any other symptoms.    MEDICAL HISTORY:  Past Medical History:  Diagnosis Date  . Cancer [name])     SURGICAL HISTORY: Past Surgical History:  Procedure Laterality Date  . BACK SURGERY      SOCIAL HISTORY: Social History   Socioeconomic History  . Marital status: Single    Spouse name: Not on file  . Number of children: Not on file  . Years of education: Not on file  . Highest education level: Not on file  Occupational History  . Not on file  Social Needs  . Financial resource strain: Not on file  . Food insecurity:    Worry: Not on file    Inability: Not on file  . Transportation needs:    Medical: Not on file    Non-medical: Not on file  Tobacco Use  . Smoking status: Never Smoker  . Smokeless tobacco: Never Used  Substance and Sexual  Activity  . Alcohol use: Never    Frequency: Never  . Drug use: Never  . Sexual activity: Not on file  Lifestyle  . Physical activity:    Days per week: Not on file    Minutes per session: Not on file  . Stress: Not on file  Relationships  . Social connections:    Talks on phone: Not on file    Gets together: Not on file    Attends religious service: Not on file    Active member of club or organization: Not on file    Attends meetings of clubs or organizations: Not on file    Relationship status: Not on file  . Intimate partner violence:    Fear of current or ex partner: Not on file  Emotionally abused: Not on file    Physically abused: Not on file    Forced sexual activity: Not on file  Other Topics Concern  . Not on file  Social History Narrative  . Not on file    FAMILY HISTORY: No family history on file.  ALLERGIES:  is allergic to penicillins.  MEDICATIONS:  Current Outpatient Medications  Medication Sig Dispense Refill  . acyclovir (ZOVIRAX) 400 MG tablet Take 400 mg by mouth 2 (two) times daily.    . Calcium Carb-Cholecalciferol (CALCIUM 1000 + D) 1000-800 MG-UNIT TABS Take 1,000 mg by mouth once.    . ondansetron (ZOFRAN) 4 MG tablet Take 1 tablet (4 mg total) by mouth every 8 (eight) hours as needed for nausea or vomiting. 30 tablet 0   No current facility-administered medications for this visit.     REVIEW OF SYSTEMS:    A 10+ POINT REVIEW OF SYSTEMS WAS OBTAINED including neurology, dermatology, psychiatry, cardiac, respiratory, lymph, extremities, GI, GU, Musculoskeletal, constitutional, breasts, reproductive, HEENT.  All pertinent positives are noted in the HPI.  All others are negative.   PHYSICAL EXAMINATION: ECOG PERFORMANCE STATUS: 1 - Symptomatic but completely ambulatory  Vitals:   05/09/18 1336  BP: 123/72  Pulse: 85  Resp: 19  Temp: 99.1 F (37.3 C)  SpO2: 100%   Filed Weights   05/09/18 1336  Weight: 128 lb 1.6 oz (58.1 kg)    .Body mass index is 19.77 kg/m.  GENERAL:alert, in no acute distress and comfortable SKIN: no acute rashes, no significant lesions EYES: conjunctiva are pink and non-injected, sclera anicteric OROPHARYNX: MMM, no exudates, no oropharyngeal erythema or ulceration NECK: supple, no JVD LYMPH:  no palpable lymphadenopathy in the cervical, axillary or inguinal regions LUNGS: clear to auscultation b/l with normal respiratory effort HEART: regular rate & rhythm ABDOMEN:  normoactive bowel sounds , non tender, not distended. No palpable hepatosplenomegaly.  Extremity: no pedal edema PSYCH: alert & oriented x 3 with fluent speech NEURO: no focal motor/sensory deficits   LABORATORY DATA:  I have reviewed the data as listed  . CBC Latest Ref Rng & Units 05/09/2018 04/25/2018 04/20/2018  WBC 4.0 - 10.5 K/uL 4.5 4.1 4.6  Hemoglobin 13.0 - 17.0 g/dL 11.5(L) 11.6(L) 12.4(L)  Hematocrit 39.0 - 52.0 % 35.6(L) 36.9(L) 38.1(L)  Platelets 150 - 400 K/uL 223 193 216    . CMP Latest Ref Rng & Units 04/25/2018 04/20/2018 05/08/2017  Glucose 70 - 99 mg/dL 105(H) 95 111(H)  BUN 8 - 23 mg/dL '14 12 8  ' Creatinine 0.61 - 1.24 mg/dL 0.70 0.72 0.57(L)  Sodium 135 - 145 mmol/L 143 141 138  Potassium 3.5 - 5.1 mmol/L 3.9 4.1 4.4  Chloride 98 - 111 mmol/L 107 105 105  CO2 22 - 32 mmol/L '26 26 27  ' Calcium 8.9 - 10.3 mg/dL 9.4 9.4 9.3  Total Protein 6.5 - 8.1 g/dL 6.7 7.3 6.4(L)  Total Bilirubin 0.3 - 1.2 mg/dL 0.5 0.9 0.6  Alkaline Phos 38 - 126 U/L 62 78 46  AST 15 - 41 U/L 14(L) 15 18  ALT 0 - 44 U/L 19 22 13(L)     RADIOGRAPHIC STUDIES: I have personally reviewed the radiological images as listed and agreed with the findings in the report. No results found.  ASSESSMENT & PLAN:  63 y.o. male with  1. Multiple Myeloma - high risk with 17p deletion July 2018 BM Bx revealed 20% monoclonal plasma cells July 2018 Cytogenetics revealed a 17p deletion July 2018  Initial M spike at 1.5g with IgG Lambda  specificity, K:L ratio of 0.27. IgG at 2220. 08/11/16 PET/CT revealed Hypermetabolic large soft tissue mass in the lower thoracic paraspinal region associated with lytic destruction of T10 vertebral body, concerning for multiple myeloma. Additional smaller lytic lesions involving the skeleton. S/p RT x 4 fractions, discontinued due to T10 pathologic fracture with severe cord compression S/p 08/26/16 T10 corpectomy and T8-L1 posterior spinal fusion   Began 7 cycles of VRd on 09/24/16 S/p autologous stem cell transplant, Day 0 on 02/23/17 04/21/17 BM Bx with residual less than 5% lambda light chain restricted plasma cells in the bone marrow. M Spike at 0.4g Began maintenance 87m/m2 Carfilzomib every 2 weeks on 515/19  PLAN: -Discussed pt labwork today, 05/09/18; blood counts are stable -Discussed the 04/20/18 MMP which revealed M Protein at 0.2g -The pt has no prohibitive toxicities from continuing maintenance Carfilzomib at this time. -Will add pre-treatment steroids and tylenol for better treatment tolerance and minimize drug related chills that patient reported. -Discussed that I recommend the patient establishing care with a transplant [name] locally, in [place] and would [place] every 3 months, beginning in late April, when the pt's next dose is due -Advised that pt establish care with a PCP locally -Will see the pt back in 2 weeks   -cancel appointments scheduled on 4/13 -RTC as per scheduled appointments on 05/23/2018   All of the patients questions were answered with apparent satisfaction. The patient knows to call the clinic with any problems, questions or concerns.  The total time spent in the appt was 25 minutes and more than 50% was on counseling and direct patient cares.    GSullivan LoneMD MS AAHIVMS SAlaska Va Healthcare SystemCAcadia MontanaHematology/Oncology Physician [facility]ay [facility] (Office):        [phone](Work cell):  [phone](Fax):           [phone] 05/09/2018 2:03 PM  I, SBaldwin [place] am acting as a scribe for Dr. GSullivan Lone   .I have reviewed the above documentation for accuracy and completeness, and I agree with the above. .Brunetta GeneraMD

## 2018-05-09 ENCOUNTER — Other Ambulatory Visit: Payer: Self-pay | Admitting: Hematology

## 2018-05-09 ENCOUNTER — Telehealth: Payer: Self-pay | Admitting: Hematology

## 2018-05-09 ENCOUNTER — Inpatient Hospital Stay: Payer: 59

## 2018-05-09 ENCOUNTER — Inpatient Hospital Stay (HOSPITAL_BASED_OUTPATIENT_CLINIC_OR_DEPARTMENT_OTHER): Payer: 59 | Admitting: Hematology

## 2018-05-09 ENCOUNTER — Inpatient Hospital Stay: Payer: 59 | Attending: Hematology

## 2018-05-09 ENCOUNTER — Other Ambulatory Visit: Payer: Self-pay

## 2018-05-09 VITALS — BP 123/72 | HR 85 | Temp 99.1°F | Resp 19 | Ht 67.5 in | Wt 128.1 lb

## 2018-05-09 DIAGNOSIS — C9000 Multiple myeloma not having achieved remission: Secondary | ICD-10-CM | POA: Diagnosis present

## 2018-05-09 DIAGNOSIS — Z79899 Other long term (current) drug therapy: Secondary | ICD-10-CM | POA: Insufficient documentation

## 2018-05-09 DIAGNOSIS — Z9484 Stem cells transplant status: Secondary | ICD-10-CM | POA: Diagnosis not present

## 2018-05-09 DIAGNOSIS — Z5111 Encounter for antineoplastic chemotherapy: Secondary | ICD-10-CM | POA: Insufficient documentation

## 2018-05-09 DIAGNOSIS — Z7189 Other specified counseling: Secondary | ICD-10-CM

## 2018-05-09 LAB — CMP (CANCER CENTER ONLY)
ALT: 16 U/L (ref 0–44)
AST: 12 U/L — ABNORMAL LOW (ref 15–41)
Albumin: 4 g/dL (ref 3.5–5.0)
Alkaline Phosphatase: 57 U/L (ref 38–126)
Anion gap: 8 (ref 5–15)
BUN: 12 mg/dL (ref 8–23)
CO2: 27 mmol/L (ref 22–32)
Calcium: 9.4 mg/dL (ref 8.9–10.3)
Chloride: 106 mmol/L (ref 98–111)
Creatinine: 0.71 mg/dL (ref 0.61–1.24)
GFR, Est AFR Am: >60 mL/min (ref >60)
GFR, Estimated: >60 mL/min (ref >60)
Glucose, Bld: 96 mg/dL (ref 70–99)
Potassium: 3.9 mmol/L (ref 3.5–5.1)
Sodium: 141 mmol/L (ref 135–145)
Total Bilirubin: 0.7 mg/dL (ref 0.3–1.2)
Total Protein: 6.6 g/dL (ref 6.5–8.1)

## 2018-05-09 LAB — CBC WITH DIFFERENTIAL/PLATELET
Abs Immature Granulocytes: 0.02 10*3/uL (ref 0.00–0.07)
Basophils Absolute: 0 10*3/uL (ref 0.0–0.1)
Basophils Relative: 0 %
Eosinophils Absolute: 0 10*3/uL (ref 0.0–0.5)
Eosinophils Relative: 1 %
HCT: 35.6 % — ABNORMAL LOW (ref 39.0–52.0)
Hemoglobin: 11.5 g/dL — ABNORMAL LOW (ref 13.0–17.0)
Immature Granulocytes: 0 %
Lymphocytes Relative: 36 %
Lymphs Abs: 1.6 10*3/uL (ref 0.7–4.0)
MCH: 29.7 pg (ref 26.0–34.0)
MCHC: 32.3 g/dL (ref 30.0–36.0)
MCV: 92 fL (ref 80.0–100.0)
Monocytes Absolute: 0.5 10*3/uL (ref 0.1–1.0)
Monocytes Relative: 12 %
Neutro Abs: 2.3 10*3/uL (ref 1.7–7.7)
Neutrophils Relative %: 51 %
Platelets: 223 10*3/uL (ref 150–400)
RBC: 3.87 MIL/uL — ABNORMAL LOW (ref 4.22–5.81)
RDW: 15.6 % — ABNORMAL HIGH (ref 11.5–15.5)
WBC: 4.5 10*3/uL (ref 4.0–10.5)
nRBC: 0 % (ref 0.0–0.2)

## 2018-05-09 MED ORDER — SODIUM CHLORIDE 0.9 % IV SOLN
Freq: Once | INTRAVENOUS | Status: AC
  Administered 2018-05-09: 14:00:00 via INTRAVENOUS
  Filled 2018-05-09: qty 250

## 2018-05-09 MED ORDER — PROCHLORPERAZINE MALEATE 10 MG PO TABS
10.0000 mg | ORAL_TABLET | Freq: Once | ORAL | Status: AC
  Administered 2018-05-09: 14:00:00 10 mg via ORAL

## 2018-05-09 MED ORDER — PROCHLORPERAZINE MALEATE 10 MG PO TABS
ORAL_TABLET | ORAL | Status: AC
  Filled 2018-05-09: qty 1

## 2018-05-09 MED ORDER — DEXTROSE 5 % IV SOLN
54.0000 mg/m2 | Freq: Once | INTRAVENOUS | Status: AC
  Administered 2018-05-09: 90 mg via INTRAVENOUS
  Filled 2018-05-09: qty 30

## 2018-05-09 NOTE — Patient Instructions (Signed)
[place] [facility] Discharge Instructions for Patients Receiving Chemotherapy  Today you received the following chemotherapy agents Carfilzomib (KYPROLIS).  To help prevent nausea and vomiting after your treatment, we encourage you to take your nausea medication as prescribed.  If you develop nausea and vomiting that is not controlled by your nausea medication, call the clinic.   BELOW ARE SYMPTOMS THAT SHOULD BE REPORTED IMMEDIATELY:  *FEVER GREATER THAN 100.5 F  *CHILLS WITH OR WITHOUT FEVER  NAUSEA AND VOMITING THAT IS NOT CONTROLLED WITH YOUR NAUSEA MEDICATION  *UNUSUAL SHORTNESS OF BREATH  *UNUSUAL BRUISING OR BLEEDING  TENDERNESS IN MOUTH AND THROAT WITH OR WITHOUT PRESENCE OF ULCERS  *URINARY PROBLEMS  *BOWEL PROBLEMS  UNUSUAL RASH Items with * indicate a potential emergency and should be followed up as soon as possible.  Feel free to call the clinic should you have any questions or concerns. The clinic phone number is [phone].  Please show the [place] at check-in to the Emergency Department and triage nurse.  Coronavirus (COVID-19) Are you at risk?  Are you at risk for the Coronavirus (COVID-19)?  To be considered HIGH RISK for Coronavirus (COVID-19), you have to meet the following criteria:  . Traveled to [place], [place], [place], [place] or [place]; or in the [place] to [place], [place], [place], or [place]; and have fever, cough, and shortness of breath within the last 2 weeks of travel OR . Been in close contact with a person diagnosed with COVID-19 within the last 2 weeks and have fever, cough, and shortness of breath . IF YOU DO NOT MEET THESE CRITERIA, YOU ARE CONSIDERED LOW RISK FOR COVID-19.  What to do if you are HIGH RISK for COVID-19?  [name] If you are having a medical emergency, call 911. . Seek medical care right away. Before you go to a doctor's office, urgent care or emergency department, call ahead and tell them  about your recent travel, contact with someone diagnosed with COVID-19, and your symptoms. You should receive instructions from your physician's office regarding next steps of care.  . When you arrive at healthcare provider, tell the healthcare staff immediately you have returned from visiting [place], [place], [place], [place] or [place]; or traveled in the [place] to [place], [place], [place], or [place]; in the last two weeks or you have been in close contact with a person diagnosed with COVID-19 in the last 2 weeks.   . Tell the health care staff about your symptoms: fever, cough and shortness of breath. . After you have been seen by a medical provider, you will be either: o Tested for (COVID-19) and discharged home on quarantine except to seek medical care if symptoms worsen, and asked to  - Stay home and avoid contact with others until you get your results (4-5 days)  - Avoid travel on public transportation if possible (such as bus, train, or airplane) or o Sent to the Emergency Department by EMS for evaluation, COVID-19 testing, and possible admission depending on your condition and test results.  What to do if you are LOW RISK for COVID-19?  Reduce your risk of any infection by using the same precautions used for avoiding the common cold or flu:  [name] Wash your hands often with soap and warm water for at least 20 seconds.  If soap and water are not readily available, use an alcohol-based hand sanitizer with at least 60% alcohol.  . If coughing or sneezing,  cover your mouth and nose by coughing or sneezing into the elbow areas of your shirt or coat, into a tissue or into your sleeve (not your hands). . Avoid shaking hands with others and consider head nods or verbal greetings only. . Avoid touching your eyes, nose, or mouth with unwashed hands.  . Avoid close contact with people who are sick. . Avoid places or events with large numbers of people in one location, like concerts or  sporting events. . Carefully consider travel plans you have or are making. . If you are planning any travel outside or inside the [place], visit the CDC's Travelers' Health webpage for the latest health notices. . If you have some symptoms but not all symptoms, continue to monitor at home and seek medical attention if your symptoms worsen. . If you are having a medical emergency, call 911.   [place] / e-Visit: [email or web address]         [facility]: [facility]: [phone]                   [facility]: [phone]

## 2018-05-09 NOTE — Telephone Encounter (Signed)
Scheduled appt per 4/6 los 

## 2018-05-10 MED ORDER — ACYCLOVIR 400 MG PO TABS: 400.0000 mg | ORAL_TABLET | Freq: Two times a day (BID) | ORAL | 2 refills | Status: DC

## 2018-05-16 ENCOUNTER — Other Ambulatory Visit: Payer: 59

## 2018-05-16 ENCOUNTER — Ambulatory Visit: Payer: 59

## 2018-05-20 NOTE — Progress Notes (Signed)
HEMATOLOGY/ONCOLOGY CLINIC NOTE  Date of Service: 05/23/2018  Patient Care Team: Patient, No Pcp Per as PCP - General (General Practice)  CHIEF COMPLAINTS/PURPOSE OF CONSULTATION:  Continued management of Multiple Myeloma  Oncologic History:   Jesus [name] was diagnosed with multiple myeloma in July 2018 in [place]. He initially developed mid back pain in May 2018 and was seen to have a T10 vertebral body mass with additional lytic lesion at T9. His initial M spike in July 2018 was 1.5g with IgG Lambda specificity and IgG elevated at 2227m. His July 2018 BM Bx revealed 20% monoclonal plasma cells and FISH revealed a 17p deletion. He received 4 fractions of palliative RT, however his back pain worsened and developed bilateral lower extremity numbness and tingling, and was then seen to have a T10 pathologic fracture with cord compression s/p T10 corpectomy. The pt began VRd on 09/24/16, completed 7 cycles, then [facility], post transplant complicated by neutropenic colitis and deconditioning. Repeat BM Bx on 04/21/17 revealed residual plasma cell with less than 5% lambda light chain restricted plasma cells, and a post-transplant M spike of 0.4g. The pt then began maintenance 569mm2 Carfilzomib every 2 weeks on 06/16/17.  HISTORY OF PRESENTING ILLNESS:   Jesus STOGDILLs a wonderful 63yo. male who has been referred to usK[place] Dr. BePhil Doppor evaluation and management of Multiple Myeloma. The pt reports that he is doing well overall.  The [place], the pt has moved back to his home which is here locally, and is transferring his care here now as well. The pt notes that he is anticipating C11D1 maintenance Carfilzomib today. The pt notes that his most recent M  spike was 0.2g. His last BM biopsy was March 2019, as noted above. He denies any infection issues in the last year. The pt notes that he has received all of is post-transplant vaccinations. [facility]  The pt reports that he continues to have back pain. He has been receiving Zometa every 4 weeks, but was recently transitioned to every 3 months, he denies dental concerns at this time. He is taking Calcium and Vitamin D replacement.   The pt has been taking Marinol for appetite stimulation.   The pt notes that he has an enlarged prostate, which was seen on imaging. He notes that he was expecting to receive a PSA test soon.  The pt notes that prior to his Multiple Myeloma diagnosis in July 2018, he had a "clean bill of health." He denies DM, HTN, lung problems, heart problems, thyroid problems or other medical concerns.  Most recent lab results (03/23/18) of CBC is as follows: all values are WNL except for RBC at 4.03, HGB at 11.5, HCT at 35.6%, MPV at 8.9, Glucose at 135, Total Protein at 6.2.  On review of systems, pt reports chronic back pain, stable energy levels, and denies concerns for infections, new pain along the spine, abdominal pains, leg swelling, and any other symptoms.   On PMHx the pt reports Multiple Myeloma, denies HTN, HLD, lung problems, strokes, seizures, heart problems and thyroid problems. On Social Hx the pt reports working for the [employer] Interval History:  Jesus [name] returns today for management and evaluation of his multiple myeloma not in remission, and his maintenance Carfilzomib treatment. The patient's last visit with [place] was on 05/09/18. The pt reports that he is doing well overall.   The pt reports that he has continued to have hip pain. He has been working with his insurance company to establish care with a PCP locally. Other than his hip pain, he denies developing any new concerns. He notes  that after the premedications were optimized he did not have repeated chills after his last Carfilzomib infusion.   The pt notes that his right sided hip pain is completely new as of a month ago. He has had left sided hip pain in the past which was worked up with orthopedics. He notes that his pain is not present when he pushes on the spot. He denies radiating pain.  Lab results today (05/23/18) of CBC w/diff and CMP is as follows: all values are WNL except for RBC at 4.12, HGB at 12.4, HCT at 37.4.  On review of systems, pt reports right hip pain, stable energy levels, and denies leg swelling, abdominal pains, new bone pains, chills, and any other symptoms.   MEDICAL HISTORY:  Past Medical History:  Diagnosis Date  . Cancer [facility])     SURGICAL HISTORY: Past Surgical History:  Procedure Laterality Date  . BACK SURGERY      SOCIAL HISTORY: Social History   Socioeconomic History  . Marital status: Single    Spouse name: Not on file  . Number of children: Not on file  . Years of education: Not on file  . Highest education level: Not on file  Occupational History  . Not on file  Social Needs  . Financial resource strain: Not on file  . Food insecurity:    Worry: Not on file    Inability: Not on file  . Transportation needs:    Medical: Not on file    Non-medical: Not on file  Tobacco Use  . Smoking status: Never Smoker  . Smokeless tobacco: Never Used  Substance and Sexual Activity  . Alcohol use: Never    Frequency: Never  . Drug use: Never  . Sexual activity: Not on file  Lifestyle  . Physical activity:    Days per week: Not on file    Minutes per session: Not on file  . Stress: Not on file  Relationships  . Social connections:    Talks on phone: Not on file    Gets together: Not on file    Attends religious service: Not on file    Active member of club or organization: Not on file    Attends meetings of clubs or organizations: Not on file    Relationship  status: Not on file  . Intimate partner violence:    Fear of current or ex partner: Not on file    Emotionally abused: Not on file    Physically abused: Not on file    Forced sexual activity: Not on file  Other Topics Concern  . Not on file  Social History Narrative  . Not on file    FAMILY HISTORY: No family history on file.  ALLERGIES:  is allergic to penicillins.  MEDICATIONS:  Current Outpatient Medications  Medication Sig Dispense Refill  . acyclovir (ZOVIRAX) 400 MG tablet Take 1 tablet (400 mg total) by mouth 2 (two) times daily. 60 tablet 2  . Calcium Carb-Cholecalciferol (CALCIUM 1000 + D) 1000-800 MG-UNIT  TABS Take 1,000 mg by mouth once.    . ondansetron (ZOFRAN) 4 MG tablet Take 1 tablet (4 mg total) by mouth every 8 (eight) hours as needed for nausea or vomiting. 30 tablet 0  . traMADol (ULTRAM) 50 MG tablet Take 1 tablet (50 mg total) by mouth every 6 (six) hours as needed for moderate pain or severe pain. 60 tablet 0   No current facility-administered medications for this visit.     REVIEW OF SYSTEMS:    A 10+ POINT REVIEW OF SYSTEMS WAS OBTAINED including neurology, dermatology, psychiatry, cardiac, respiratory, lymph, extremities, GI, GU, Musculoskeletal, constitutional, breasts, reproductive, HEENT.  All pertinent positives are noted in the HPI.  All others are negative.   PHYSICAL EXAMINATION: ECOG PERFORMANCE STATUS: 1 - Symptomatic but completely ambulatory  Vitals:   05/23/18 0957  BP: 125/85  Pulse: 100  Resp: 18  Temp: 98.2 F (36.8 C)  SpO2: 100%   Filed Weights   05/23/18 0957  Weight: 125 lb 11.2 oz (57 kg)   .Body mass index is 19.4 kg/m.  GENERAL:alert, in no acute distress and comfortable SKIN: no acute rashes, no significant lesions EYES: conjunctiva are pink and non-injected, sclera anicteric OROPHARYNX: MMM, no exudates, no oropharyngeal erythema or ulceration NECK: supple, no JVD LYMPH:  no palpable lymphadenopathy in the  cervical, axillary or inguinal regions LUNGS: clear to auscultation b/l with normal respiratory effort HEART: regular rate & rhythm ABDOMEN:  normoactive bowel sounds , non tender, not distended. No palpable hepatosplenomegaly.  Extremity: no pedal edema PSYCH: alert & oriented x 3 with fluent speech NEURO: no focal motor/sensory deficits   LABORATORY DATA:  I have reviewed the data as listed  . CBC Latest Ref Rng & Units 05/23/2018 05/09/2018 04/25/2018  WBC 4.0 - 10.5 K/uL 4.3 4.5 4.1  Hemoglobin 13.0 - 17.0 g/dL 12.4(L) 11.5(L) 11.6(L)  Hematocrit 39.0 - 52.0 % 37.4(L) 35.6(L) 36.9(L)  Platelets 150 - 400 K/uL 224 223 193    . CMP Latest Ref Rng & Units 05/23/2018 05/09/2018 04/25/2018  Glucose 70 - 99 mg/dL 84 96 105(H)  BUN 8 - 23 mg/dL '9 12 14  ' Creatinine 0.61 - 1.24 mg/dL 0.77 0.71 0.70  Sodium 135 - 145 mmol/L 138 141 143  Potassium 3.5 - 5.1 mmol/L 4.1 3.9 3.9  Chloride 98 - 111 mmol/L 102 106 107  CO2 22 - 32 mmol/L '26 27 26  ' Calcium 8.9 - 10.3 mg/dL 9.9 9.4 9.4  Total Protein 6.5 - 8.1 g/dL 7.2 6.6 6.7  Total Bilirubin 0.3 - 1.2 mg/dL 0.7 0.7 0.5  Alkaline Phos 38 - 126 U/L 70 57 62  AST 15 - 41 U/L 15 12(L) 14(L)  ALT 0 - 44 U/L '17 16 19     ' RADIOGRAPHIC STUDIES: I have personally reviewed the radiological images as listed and agreed with the findings in the report. No results found.  ASSESSMENT & PLAN:  63 y.o. male with  1. 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 IgG at 2220. 08/11/16 PET/CT revealed Hypermetabolic large soft tissue mass in the lower thoracic paraspinal region associated with lytic destruction of T10 vertebral body, concerning for multiple myeloma. Additional smaller lytic lesions involving the skeleton. S/p RT x 4 fractions, discontinued due to T10 pathologic  fracture with severe cord compression S/p  08/26/16 T10 corpectomy and T8-L1 posterior spinal fusion   Began 7 cycles of VRd on 09/24/16 S/p autologous stem cell transplant, Day 0 on 02/23/17 04/21/17 BM Bx with residual less than 5% lambda light chain restricted plasma cells in the bone marrow. M Spike at 0.4g Began maintenance 84m/m2 Carfilzomib every 2 weeks on 06/16/17  PLAN: -Discussed pt labwork today, 05/23/18; HGB improved to 12.4, blood chemistries normal -The pt has no prohibitive toxicities from continuing maintenance Carfilzomib at this time. -Will order PET/CT for interval re-evaluation and focal pain symptomatology, and considerations of local RT -Will refill Tramadol for right iliac crest pain -Did add pre-treatment steroids and tylenol for better treatment tolerance and minimize drug related chills that patient reported. Pt notes these adjustments were effective. -Discussed that I recommend the patient establishing care with a transplant center locally, in [place] and would recommend [place] -Advised that pt establish care with a PCP locally -Will see the pt back in 4 weeks   -Please schedule to start Zometa every 12 weeks (starting in 2 weeks) - plz schedule 4 doses -plz schedule next 4 doses of Carfilzomib maintenance q2weeks with labs -PET/CT in 2 weeks -RTC with Dr KIrene Limboin 4 weeks   All of the patients questions were answered with apparent satisfaction. The patient knows to call the clinic with any problems, questions or concerns.  The total time spent in the appt was 25 minutes and more than 50% was on counseling and direct patient cares.    GSullivan LoneMD MS AAHIVMS SSouthwest Eye Surgery CenterCRex Surgery Center Of Wake[facility] LLCHematology/Oncology Physician CLew[facility] (Office):       [phone](Work cell):  [phone](Fax):           [phone] 05/23/2018 10:51 AM  I, SBaldwin Jamaica am acting as a scribe  for Dr. GSullivan Lone   .I have reviewed the above documentation for accuracy and completeness, and I agree with the above. .Brunetta GeneraMD

## 2018-05-23 ENCOUNTER — Inpatient Hospital Stay (HOSPITAL_BASED_OUTPATIENT_CLINIC_OR_DEPARTMENT_OTHER): Payer: 59 | Admitting: Hematology

## 2018-05-23 ENCOUNTER — Inpatient Hospital Stay: Payer: 59

## 2018-05-23 ENCOUNTER — Other Ambulatory Visit: Payer: Self-pay

## 2018-05-23 VITALS — BP 125/85 | HR 100 | Temp 98.2°F | Resp 18 | Ht 67.5 in | Wt 125.7 lb

## 2018-05-23 DIAGNOSIS — C9000 Multiple myeloma not having achieved remission: Secondary | ICD-10-CM

## 2018-05-23 DIAGNOSIS — Z9484 Stem cells transplant status: Secondary | ICD-10-CM | POA: Diagnosis not present

## 2018-05-23 DIAGNOSIS — Z79899 Other long term (current) drug therapy: Secondary | ICD-10-CM | POA: Diagnosis not present

## 2018-05-23 DIAGNOSIS — Z7189 Other specified counseling: Secondary | ICD-10-CM

## 2018-05-23 LAB — CBC WITH DIFFERENTIAL/PLATELET
Abs Immature Granulocytes: 0.01 10*3/uL (ref 0.00–0.07)
Basophils Absolute: 0 10*3/uL (ref 0.0–0.1)
Basophils Relative: 1 %
Eosinophils Absolute: 0 10*3/uL (ref 0.0–0.5)
Eosinophils Relative: 1 %
HCT: 37.4 % — ABNORMAL LOW (ref 39.0–52.0)
Hemoglobin: 12.4 g/dL — ABNORMAL LOW (ref 13.0–17.0)
Immature Granulocytes: 0 %
Lymphocytes Relative: 29 %
Lymphs Abs: 1.2 10*3/uL (ref 0.7–4.0)
MCH: 30.1 pg (ref 26.0–34.0)
MCHC: 33.2 g/dL (ref 30.0–36.0)
MCV: 90.8 fL (ref 80.0–100.0)
Monocytes Absolute: 0.5 10*3/uL (ref 0.1–1.0)
Monocytes Relative: 11 %
Neutro Abs: 2.5 10*3/uL (ref 1.7–7.7)
Neutrophils Relative %: 58 %
Platelets: 224 10*3/uL (ref 150–400)
RBC: 4.12 MIL/uL — ABNORMAL LOW (ref 4.22–5.81)
RDW: 14.8 % (ref 11.5–15.5)
WBC: 4.3 10*3/uL (ref 4.0–10.5)
nRBC: 0 % (ref 0.0–0.2)

## 2018-05-23 LAB — CMP (CANCER CENTER ONLY)
ALT: 17 U/L (ref 0–44)
AST: 15 U/L (ref 15–41)
Albumin: 4.2 g/dL (ref 3.5–5.0)
Alkaline Phosphatase: 70 U/L (ref 38–126)
Anion gap: 10 (ref 5–15)
BUN: 9 mg/dL (ref 8–23)
CO2: 26 mmol/L (ref 22–32)
Calcium: 9.9 mg/dL (ref 8.9–10.3)
Chloride: 102 mmol/L (ref 98–111)
Creatinine: 0.77 mg/dL (ref 0.61–1.24)
GFR, Est AFR Am: >60 mL/min (ref >60)
GFR, Estimated: >60 mL/min (ref >60)
Glucose, Bld: 84 mg/dL (ref 70–99)
Potassium: 4.1 mmol/L (ref 3.5–5.1)
Sodium: 138 mmol/L (ref 135–145)
Total Bilirubin: 0.7 mg/dL (ref 0.3–1.2)
Total Protein: 7.2 g/dL (ref 6.5–8.1)

## 2018-05-23 MED ORDER — SODIUM CHLORIDE 0.9 % IV SOLN
Freq: Once | INTRAVENOUS | Status: AC
  Administered 2018-05-23: 11:00:00 via INTRAVENOUS
  Filled 2018-05-23: qty 250

## 2018-05-23 MED ORDER — ZOLEDRONIC ACID 4 MG/100ML IV SOLN
4.0000 mg | Freq: Once | INTRAVENOUS | Status: AC
  Administered 2018-05-23: 4 mg via INTRAVENOUS
  Filled 2018-05-23: qty 100

## 2018-05-23 MED ORDER — DEXTROSE 5 % IV SOLN
54.0000 mg/m2 | Freq: Once | INTRAVENOUS | Status: AC
  Administered 2018-05-23: 90 mg via INTRAVENOUS
  Filled 2018-05-23: qty 30

## 2018-05-23 MED ORDER — DEXAMETHASONE SODIUM PHOSPHATE 10 MG/ML IJ SOLN
INTRAMUSCULAR | Status: AC
  Filled 2018-05-23: qty 1

## 2018-05-23 MED ORDER — DEXAMETHASONE SODIUM PHOSPHATE 10 MG/ML IJ SOLN
10.0000 mg | Freq: Once | INTRAMUSCULAR | Status: AC
  Administered 2018-05-23: 10 mg via INTRAVENOUS

## 2018-05-23 MED ORDER — ACETAMINOPHEN 325 MG PO TABS
ORAL_TABLET | ORAL | Status: AC
  Filled 2018-05-23: qty 2

## 2018-05-23 MED ORDER — PROCHLORPERAZINE MALEATE 10 MG PO TABS
10.0000 mg | ORAL_TABLET | Freq: Once | ORAL | Status: AC
  Administered 2018-05-23: 10 mg via ORAL

## 2018-05-23 MED ORDER — ACETAMINOPHEN 325 MG PO TABS
650.0000 mg | ORAL_TABLET | Freq: Once | ORAL | Status: AC
  Administered 2018-05-23: 11:00:00 650 mg via ORAL

## 2018-05-23 MED ORDER — PROCHLORPERAZINE MALEATE 10 MG PO TABS
ORAL_TABLET | ORAL | Status: AC
  Filled 2018-05-23: qty 1

## 2018-05-23 MED ORDER — TRAMADOL HCL 50 MG PO TABS: 50.0000 mg | ORAL_TABLET | Freq: Four times a day (QID) | ORAL | 0 refills | Status: DC | PRN

## 2018-05-23 NOTE — Patient Instructions (Signed)
[facility] Discharge Instructions for Patients Receiving Chemotherapy  Today you received the following chemotherapy agents Carfilzomib (KYPROLIS).  To help prevent nausea and vomiting after your treatment, we encourage you to take your nausea medication as prescribed.   If you develop nausea and vomiting that is not controlled by your nausea medication, call the clinic.   BELOW ARE SYMPTOMS THAT SHOULD BE REPORTED IMMEDIATELY:  *FEVER GREATER THAN 100.5 F  *CHILLS WITH OR WITHOUT FEVER  NAUSEA AND VOMITING THAT IS NOT CONTROLLED WITH YOUR NAUSEA MEDICATION  *UNUSUAL SHORTNESS OF BREATH  *UNUSUAL BRUISING OR BLEEDING  TENDERNESS IN MOUTH AND THROAT WITH OR WITHOUT PRESENCE OF ULCERS  *URINARY PROBLEMS  *BOWEL PROBLEMS  UNUSUAL RASH Items with * indicate a potential emergency and should be followed up as soon as possible.  Feel free to call the clinic should you have any questions or concerns. The clinic phone number is [phone].  Please show the [facility] at check-in to the Emergency Department and triage nurse.  Zoledronic Acid injection (Hypercalcemia, Oncology) What is this medicine? ZOLEDRONIC ACID (ZOE le dron ik AS id) lowers the amount of calcium loss from bone. It is used to treat too much calcium in your blood from cancer. It is also used to prevent complications of cancer that has spread to the bone. This medicine may be used for other purposes; ask your health care provider or pharmacist if you have questions. COMMON BRAND NAME(S): Zometa What should I tell my health care provider before I take this medicine? They need to know if you have any of these conditions: -aspirin-sensitive asthma -cancer, especially if you are receiving medicines used to treat cancer -dental disease or wear dentures -infection -kidney disease -receiving corticosteroids like dexamethasone or prednisone -an unusual or allergic reaction to zoledronic acid,  other medicines, foods, dyes, or preservatives -pregnant or trying to get pregnant -breast-feeding How should I use this medicine? This medicine is for infusion into a vein. It is given by a health care professional in a hospital or clinic setting. Talk to your pediatrician regarding the use of this medicine in children. Special care may be needed. Overdosage: If you think you have taken too much of this medicine contact a poison control center or emergency room at once. NOTE: This medicine is only for you. Do not share this medicine with others. What if I miss a dose? It is important not to miss your dose. Call your doctor or health care professional if you are unable to keep an appointment. What may interact with this medicine? -certain antibiotics given by injection -NSAIDs, medicines for pain and inflammation, like ibuprofen or naproxen -some diuretics like bumetanide, furosemide -teriparatide -thalidomide This list may not describe all possible interactions. Give your health care provider a list of all the medicines, herbs, non-prescription drugs, or dietary supplements you use. Also tell them if you smoke, drink alcohol, or use illegal drugs. Some items may interact with your medicine. What should I watch for while using this medicine? Visit your doctor or health care professional for regular checkups. It may be some time before you see the benefit from this medicine. Do not stop taking your medicine unless your doctor tells you to. Your doctor may order blood tests or other tests to see how you are doing. Women should inform their doctor if they wish to become pregnant or think they might be pregnant. There is a potential for serious side effects to an unborn child. Talk  to your health care professional or pharmacist for more information. You should make sure that you get enough calcium and vitamin D while you are taking this medicine. Discuss the foods you eat and the vitamins you take  with your health care professional. Some people who take this medicine have severe bone, joint, and/or muscle pain. This medicine may also increase your risk for jaw problems or a broken thigh bone. Tell your doctor right away if you have severe pain in your jaw, bones, joints, or muscles. Tell your doctor if you have any pain that does not go away or that gets worse. Tell your dentist and dental surgeon that you are taking this medicine. You should not have major dental surgery while on this medicine. See your dentist to have a dental exam and fix any dental problems before starting this medicine. Take good care of your teeth while on this medicine. Make sure you see your dentist for regular follow-up appointments. What side effects may I notice from receiving this medicine? Side effects that you should report to your doctor or health care professional as soon as possible: -allergic reactions like skin rash, itching or hives, swelling of the face, lips, or tongue -anxiety, confusion, or depression -breathing problems -changes in vision -eye pain -feeling faint or lightheaded, falls -jaw pain, especially after dental work -mouth sores -muscle cramps, stiffness, or weakness -redness, blistering, peeling or loosening of the skin, including inside the mouth -trouble passing urine or change in the amount of urine Side effects that usually do not require medical attention (report to your doctor or health care professional if they continue or are bothersome): -bone, joint, or muscle pain -constipation -diarrhea -fever -hair loss -irritation at site where injected -loss of appetite -nausea, vomiting -stomach upset -trouble sleeping -trouble swallowing -weak or tired This list may not describe all possible side effects. Call your doctor for medical advice about side effects. You may report side effects to FDA at 1-800-FDA-1088. Where should I keep my medicine? This drug is given in a hospital  or clinic and will not be stored at home. NOTE: This sheet is a summary. It may not cover all possible information. If you have questions about this medicine, talk to your doctor, pharmacist, or health care provider.  2019 Elsevier/Gold Standard (2013-06-17 14:19:39)   Coronavirus (COVID-19) Are you at risk?  Are you at risk for the Coronavirus (COVID-19)?  To be considered HIGH RISK for Coronavirus (COVID-19), you have to meet the following criteria:  . Traveled to [place], [place], [place], [place] or [place]; or in the [place] to [place], [place], [place], or [place]; and have fever, cough, and shortness of breath within the last 2 weeks of travel OR . Been in close contact with a person diagnosed with COVID-19 within the last 2 weeks and have fever, cough, and shortness of breath . IF YOU DO NOT MEET THESE CRITERIA, YOU ARE CONSIDERED LOW RISK FOR COVID-19.  What to do if you are HIGH RISK for COVID-19?  [name] If you are having a medical emergency, call 911. . Seek medical care right away. Before you go to a doctor's office, urgent care or emergency department, call ahead and tell them about your recent travel, contact with someone diagnosed with COVID-19, and your symptoms. You should receive instructions from your physician's office regarding next steps of care.  . When you arrive at healthcare provider, tell the healthcare staff immediately you have returned from visiting [place], [place],  [place], [place] or [place]; or traveled in the [place] to [place], [place], [place], or [place]; in the last two weeks or you have been in close contact with a person diagnosed with COVID-19 in the last 2 weeks.   . Tell the health care staff about your symptoms: fever, cough and shortness of breath. . After you have been seen by a medical provider, you will be either: o Tested for (COVID-19) and discharged home on quarantine except to seek medical care if symptoms worsen, and  asked to  - Stay home and avoid contact with others until you get your results (4-5 days)  - Avoid travel on public transportation if possible (such as bus, train, or airplane) or o Sent to the Emergency Department by EMS for evaluation, COVID-19 testing, and possible admission depending on your condition and test results.  What to do if you are LOW RISK for COVID-19?  Reduce your risk of any infection by using the same precautions used for avoiding the common cold or flu:  [name] Wash your hands often with soap and warm water for at least 20 seconds.  If soap and water are not readily available, use an alcohol-based hand sanitizer with at least 60% alcohol.  . If coughing or sneezing, cover your mouth and nose by coughing or sneezing into the elbow areas of your shirt or coat, into a tissue or into your sleeve (not your hands). . Avoid shaking hands with others and consider head nods or verbal greetings only. . Avoid touching your eyes, nose, or mouth with unwashed hands.  . Avoid close contact with people who are sick. . Avoid places or events with large numbers of people in one location, like concerts or sporting events. . Carefully consider travel plans you have or are making. . If you are planning any travel outside or inside the [place], visit the CDC's Travelers' Health webpage for the latest health notices. . If you have some symptoms but not all symptoms, continue to monitor at home and seek medical attention if your symptoms worsen. . If you are having a medical emergency, call 911.   [place] / e-Visit: [email or web address]         [facility]: [facility]: [phone]                   [facility]: [phone]

## 2018-05-24 ENCOUNTER — Telehealth: Payer: Self-pay | Admitting: Hematology

## 2018-05-24 NOTE — Telephone Encounter (Signed)
Scheduled appt per 4/20 los. °

## 2018-06-03 ENCOUNTER — Telehealth: Payer: Self-pay | Admitting: *Deleted

## 2018-06-03 ENCOUNTER — Other Ambulatory Visit: Payer: Self-pay | Admitting: Hematology

## 2018-06-03 MED ORDER — OXYCODONE HCL 5 MG PO TABS: 5.0000 mg | ORAL_TABLET | ORAL | 0 refills | Status: DC | PRN

## 2018-06-03 NOTE — Telephone Encounter (Signed)
Contacted patient to advise that Dr. Irene Limbo sent prescription to his pharmacy for pain medication. Patient verbalized understanding.

## 2018-06-03 NOTE — Telephone Encounter (Signed)
Reports excruciating right hip pain 6/10. States Tramadol helps some, lasts 2 hours at most. Requesting stronger pain med at least until the PET on 5/7. Advised him that Dr. Irene Limbo will be informed of information and request.

## 2018-06-06 ENCOUNTER — Other Ambulatory Visit: Payer: Self-pay | Admitting: Medical

## 2018-06-06 ENCOUNTER — Inpatient Hospital Stay: Payer: 59 | Attending: Hematology

## 2018-06-06 ENCOUNTER — Other Ambulatory Visit: Payer: Self-pay

## 2018-06-06 ENCOUNTER — Inpatient Hospital Stay (HOSPITAL_BASED_OUTPATIENT_CLINIC_OR_DEPARTMENT_OTHER): Payer: 59 | Admitting: Medical

## 2018-06-06 ENCOUNTER — Other Ambulatory Visit: Payer: 59

## 2018-06-06 ENCOUNTER — Ambulatory Visit: Payer: 59 | Admitting: Hematology

## 2018-06-06 ENCOUNTER — Inpatient Hospital Stay: Payer: 59

## 2018-06-06 VITALS — BP 153/97 | HR 72 | Temp 97.9°F | Resp 17

## 2018-06-06 DIAGNOSIS — T80818A Extravasation of other vesicant agent, initial encounter: Secondary | ICD-10-CM

## 2018-06-06 DIAGNOSIS — C9000 Multiple myeloma not having achieved remission: Secondary | ICD-10-CM

## 2018-06-06 DIAGNOSIS — Z79899 Other long term (current) drug therapy: Secondary | ICD-10-CM | POA: Insufficient documentation

## 2018-06-06 DIAGNOSIS — M25551 Pain in right hip: Secondary | ICD-10-CM | POA: Diagnosis not present

## 2018-06-06 DIAGNOSIS — Z5111 Encounter for antineoplastic chemotherapy: Secondary | ICD-10-CM | POA: Diagnosis not present

## 2018-06-06 DIAGNOSIS — Z7189 Other specified counseling: Secondary | ICD-10-CM

## 2018-06-06 LAB — CBC WITH DIFFERENTIAL/PLATELET
Abs Immature Granulocytes: 0.01 10*3/uL (ref 0.00–0.07)
Basophils Absolute: 0 10*3/uL (ref 0.0–0.1)
Basophils Relative: 0 %
Eosinophils Absolute: 0 10*3/uL (ref 0.0–0.5)
Eosinophils Relative: 1 %
HCT: 36.9 % — ABNORMAL LOW (ref 39.0–52.0)
Hemoglobin: 12.3 g/dL — ABNORMAL LOW (ref 13.0–17.0)
Immature Granulocytes: 0 %
Lymphocytes Relative: 30 %
Lymphs Abs: 1.4 10*3/uL (ref 0.7–4.0)
MCH: 29.7 pg (ref 26.0–34.0)
MCHC: 33.3 g/dL (ref 30.0–36.0)
MCV: 89.1 fL (ref 80.0–100.0)
Monocytes Absolute: 0.5 10*3/uL (ref 0.1–1.0)
Monocytes Relative: 10 %
Neutro Abs: 2.7 10*3/uL (ref 1.7–7.7)
Neutrophils Relative %: 59 %
Platelets: 204 10*3/uL (ref 150–400)
RBC: 4.14 MIL/uL — ABNORMAL LOW (ref 4.22–5.81)
RDW: 14.3 % (ref 11.5–15.5)
WBC: 4.7 10*3/uL (ref 4.0–10.5)
nRBC: 0 % (ref 0.0–0.2)

## 2018-06-06 LAB — CMP (CANCER CENTER ONLY)
ALT: 19 U/L (ref 0–44)
AST: 15 U/L (ref 15–41)
Albumin: 4.2 g/dL (ref 3.5–5.0)
Alkaline Phosphatase: 68 U/L (ref 38–126)
Anion gap: 10 (ref 5–15)
BUN: 8 mg/dL (ref 8–23)
CO2: 22 mmol/L (ref 22–32)
Calcium: 9.3 mg/dL (ref 8.9–10.3)
Chloride: 106 mmol/L (ref 98–111)
Creatinine: 0.76 mg/dL (ref 0.61–1.24)
GFR, Est AFR Am: >60 mL/min (ref >60)
GFR, Estimated: >60 mL/min (ref >60)
Glucose, Bld: 112 mg/dL — ABNORMAL HIGH (ref 70–99)
Potassium: 3.5 mmol/L (ref 3.5–5.1)
Sodium: 138 mmol/L (ref 135–145)
Total Bilirubin: 0.5 mg/dL (ref 0.3–1.2)
Total Protein: 7.1 g/dL (ref 6.5–8.1)

## 2018-06-06 MED ORDER — PROCHLORPERAZINE EDISYLATE 10 MG/2ML IJ SOLN
INTRAMUSCULAR | Status: AC
  Filled 2018-06-06: qty 2

## 2018-06-06 MED ORDER — SODIUM CHLORIDE 0.9 % IV SOLN
Freq: Once | INTRAVENOUS | Status: AC
  Administered 2018-06-06: 10:00:00 via INTRAVENOUS
  Filled 2018-06-06: qty 250

## 2018-06-06 MED ORDER — HYDROMORPHONE HCL 1 MG/ML IJ SOLN
0.5000 mg | Freq: Once | INTRAMUSCULAR | Status: AC
  Administered 2018-06-06: 0.5 mg via INTRAVENOUS
  Filled 2018-06-06: qty 0.5

## 2018-06-06 MED ORDER — HYDROMORPHONE HCL 1 MG/ML IJ SOLN
INTRAMUSCULAR | Status: AC
  Filled 2018-06-06: qty 1

## 2018-06-06 MED ORDER — SODIUM CHLORIDE 0.9 % IV SOLN
Freq: Once | INTRAVENOUS | Status: AC
  Administered 2018-06-06: 11:00:00 via INTRAVENOUS
  Filled 2018-06-06: qty 250

## 2018-06-06 MED ORDER — DEXAMETHASONE SODIUM PHOSPHATE 10 MG/ML IJ SOLN
INTRAMUSCULAR | Status: AC
  Filled 2018-06-06: qty 1

## 2018-06-06 MED ORDER — GABAPENTIN 300 MG PO CAPS: 300.0000 mg | ORAL_CAPSULE | Freq: Three times a day (TID) | ORAL | 2 refills | Status: DC

## 2018-06-06 MED ORDER — ACETAMINOPHEN 325 MG PO TABS
650.0000 mg | ORAL_TABLET | Freq: Once | ORAL | Status: AC
  Administered 2018-06-06: 650 mg via ORAL

## 2018-06-06 MED ORDER — DEXTROSE 5 % IV SOLN
54.0000 mg/m2 | Freq: Once | INTRAVENOUS | Status: AC
  Administered 2018-06-06: 90 mg via INTRAVENOUS
  Filled 2018-06-06: qty 30

## 2018-06-06 MED ORDER — PROCHLORPERAZINE MALEATE 10 MG PO TABS
ORAL_TABLET | ORAL | Status: AC
  Filled 2018-06-06: qty 1

## 2018-06-06 MED ORDER — PROCHLORPERAZINE MALEATE 10 MG PO TABS
10.0000 mg | ORAL_TABLET | Freq: Once | ORAL | Status: AC
  Administered 2018-06-06: 10 mg via ORAL

## 2018-06-06 MED ORDER — DEXAMETHASONE SODIUM PHOSPHATE 10 MG/ML IJ SOLN
10.0000 mg | Freq: Once | INTRAMUSCULAR | Status: AC
  Administered 2018-06-06: 10 mg via INTRAVENOUS

## 2018-06-06 MED ORDER — ACETAMINOPHEN 325 MG PO TABS
ORAL_TABLET | ORAL | Status: AC
  Filled 2018-06-06: qty 2

## 2018-06-06 MED ORDER — PROCHLORPERAZINE EDISYLATE 10 MG/2ML IJ SOLN
10.0000 mg | Freq: Once | INTRAMUSCULAR | Status: AC
  Administered 2018-06-06: 10 mg via INTRAVENOUS
  Filled 2018-06-06: qty 2

## 2018-06-06 NOTE — Progress Notes (Signed)
Pt vomited PO Compazine dose.  RN requested IV.  Dose entered. Kennith Center, Pharm.D., CPP 06/06/2018@10 :06 AM

## 2018-06-06 NOTE — Progress Notes (Signed)
   DATE:  06/06/2018      IV EXTRAVASATION (neutral)  MD:  Dr. Sullivan Lone  AGENT RECEIVED AT TIME OF EXTRAVASATION:   Carfilzomib  IV SITE LOCATION: Right dorsal forearm  INTERVENTION:  1) IV stopped  2)  +/- 1 ml liquid/blood aspirated from IV site.  3) Patient Teaching Instruction Sheet reviewed with Jesus [name].  A) Apply cold to area for 15 to 20 minutes 4 times daily for the next 48 hours B) Jesus [name] was instructed to call [phone] or return if he has questions or notes acute changes to the area.  Review of Systems  Constitutional: Negative for chills, diaphoresis and fever.  HENT: Negative for trouble swallowing and voice change.   Respiratory: Negative for cough, chest tightness, shortness of breath and wheezing.   Cardiovascular: Negative for chest pain and palpitations.  Gastrointestinal: Negative for abdominal pain, constipation, diarrhea, nausea and vomiting.  Musculoskeletal: Positive for arthralgias. Negative for back pain and myalgias.  Skin:       Swelling of the right medial elbow proximal to an IV insertion site.  Neurological: Negative for dizziness, light-headedness and headaches.    Physical Exam Constitutional:      General: He is not in acute distress.    Appearance: Normal appearance. He is not ill-appearing.  Cardiovascular:     Rate and Rhythm: Normal rate and regular rhythm.  Pulmonary:     Effort: Pulmonary effort is normal.     Breath sounds: Normal breath sounds.  Skin:    General: Skin is warm and dry.     Comments: There is an area of induration over the right medial elbow proximal and medial to an IV insertion site  Neurological:     Mental Status: He is alert.      Additionally the patient had reported that he was having ongoing right hip pain which has continued despite transitioning from tramadol to oxycodone.  His oxycodone was prescribed as 1 tablet every 4 hours as needed for pain.  He has increase this to 2  tablets every 4 hours.  Additionally he is taking Tylenol.  He is scheduled for a PET/CT scan on 06/09/2018.  He describes his pain as burning.  He was given 2 mg of Dilaudid IV today and had a prescription for Neurontin 300 mg p.o. 3 times daily sent to his pharmacy.  He opted not to wait for the IV team to, and place another IV and did not receive the remainder of his infusion today.  Jesus [name], MHS, PA-C

## 2018-06-06 NOTE — Progress Notes (Signed)
[facility] RN paged for IV start. On way to cancer center received 2nd page that pt was refusing IV stick.

## 2018-06-06 NOTE — Patient Instructions (Signed)
[name] Discharge Instructions for Patients Receiving Chemotherapy  Today you received the following chemotherapy agents Kyprolis. ( Half of the Kyprolis was given)  IV Infiltration IV infiltration is when fluid from your IV leaks under your skin. This can happen if the IV needle comes out of the vein or if the IV pokes through the vein to the other side. This may cause: Pain at the IV site. Swollen, tight, or stiff skin at the IV site. White, red, or cool-feeling skin at the IV site. A damp or wet bandage (dressing), if you have one. An IV that works more slowly than normal. Burning or loss of skin at the IV site. This may happen in very bad cases. Follow these instructions at home: Take over-the-counter and prescription medicines only as told by your doctor. If directed, put ice on the swollen area: Put ice in a plastic bag. Place a towel between your skin and the bag. Leave the ice on for 20 minutes, 2-3 times a day. Raise (elevate) the swollen area above the level of your heart while you are sitting or lying down. Do not wear tight-fitting clothing, watches, or jewelry near the swollen area. Contact a doctor if: You have a fever. Your skin at the IV site becomes swollen, red, or painful. Your skin feels numb. Your skin feels tight or cool to the touch. You have blisters around the IV site. Your skin changes color or is bruised. You cannot feel your pulse where the IV was placed. Get help right away if: Your skin around the IV site turns dark and peels. You have red streaks on your skin coming from the IV site. You have pus coming from the IV site. This information is not intended to replace advice given to you by your health care provider. Make sure you discuss any questions you have with your health care provider. Document Released: 10/29/2007 Document Revised: 02/14/2015 Document Reviewed: 09/26/2014 Elsevier Interactive Patient Education  2019 [employer].    To help prevent nausea and vomiting after your treatment, we encourage you to take your nausea medication as directed.   If you develop nausea and vomiting that is not controlled by your nausea medication, call the clinic.   BELOW ARE SYMPTOMS THAT SHOULD BE REPORTED IMMEDIATELY:  *FEVER GREATER THAN 100.5 F  *CHILLS WITH OR WITHOUT FEVER  NAUSEA AND VOMITING THAT IS NOT CONTROLLED WITH YOUR NAUSEA MEDICATION  *UNUSUAL SHORTNESS OF BREATH  *UNUSUAL BRUISING OR BLEEDING  TENDERNESS IN MOUTH AND THROAT WITH OR WITHOUT PRESENCE OF ULCERS  *URINARY PROBLEMS  *BOWEL PROBLEMS  UNUSUAL RASH Items with * indicate a potential emergency and should be followed up as soon as possible.  Feel free to call the clinic you have any questions or concerns. The clinic phone number is [phone].  Please show the [facility] at check-in to the Emergency Department and triage nurse.

## 2018-06-06 NOTE — Progress Notes (Signed)
IV infiltrated half way through Kyrpolis infusion. Patient reported discomfort with swelling at site on R forearm and palpated in the R [facility] region as well. Lucianne Lei PA at bedside to assess. Cold pack applied to site per VAn PA.    Patient refused to wait for IV team after infiltration and being stuck by Kathrynn Humble RN twice. He stated he didn't want the rest of his treatment and was going home. MD Irene Limbo made aware of patient's wishes and patient was instructed on ice protocol and to call with any future concerns regarding this. He agreed.

## 2018-06-07 ENCOUNTER — Telehealth: Payer: Self-pay | Admitting: Medical

## 2018-06-07 LAB — MULTIPLE MYELOMA PANEL, SERUM
Albumin SerPl Elph-Mcnc: 4 g/dL (ref 2.9–4.4)
Albumin/Glob SerPl: 1.6 (ref 0.7–1.7)
Alpha 1: 0.2 g/dL (ref 0.0–0.4)
Alpha2 Glob SerPl Elph-Mcnc: 0.7 g/dL (ref 0.4–1.0)
B-Globulin SerPl Elph-Mcnc: 1 g/dL (ref 0.7–1.3)
Gamma Glob SerPl Elph-Mcnc: 0.7 g/dL (ref 0.4–1.8)
Globulin, Total: 2.6 g/dL (ref 2.2–3.9)
IgA: 45 mg/dL — ABNORMAL LOW (ref 61–437)
IgG (Immunoglobin G), Serum: 805 mg/dL (ref 603–1613)
IgM (Immunoglobulin M), Srm: 59 mg/dL (ref 20–172)
M Protein SerPl Elph-Mcnc: 0.4 g/dL — ABNORMAL HIGH
Total Protein ELP: 6.6 g/dL (ref 6.0–8.5)

## 2018-06-07 NOTE — Telephone Encounter (Signed)
No los per 5/4,

## 2018-06-09 ENCOUNTER — Other Ambulatory Visit: Payer: Self-pay

## 2018-06-09 ENCOUNTER — Ambulatory Visit (HOSPITAL_COMMUNITY)
Admission: RE | Admit: 2018-06-09 | Discharge: 2018-06-09 | Disposition: A | Discharge status: Home / Self Care | Payer: 59 | Source: Ambulatory Visit | Attending: Hematology | Admitting: Hematology

## 2018-06-09 DIAGNOSIS — C9000 Multiple myeloma not having achieved remission: Secondary | ICD-10-CM | POA: Insufficient documentation

## 2018-06-09 LAB — GLUCOSE, CAPILLARY: Glucose-Capillary: 99 mg/dL (ref 70–99)

## 2018-06-09 MED ORDER — FLUDEOXYGLUCOSE F - 18 (FDG) INJECTION
6.0700 | Freq: Once | INTRAVENOUS | Status: AC | PRN
  Administered 2018-06-09: 6.07 via INTRAVENOUS

## 2018-06-10 ENCOUNTER — Other Ambulatory Visit: Payer: Self-pay

## 2018-06-10 ENCOUNTER — Inpatient Hospital Stay (HOSPITAL_BASED_OUTPATIENT_CLINIC_OR_DEPARTMENT_OTHER): Payer: 59 | Admitting: Hematology

## 2018-06-10 ENCOUNTER — Telehealth: Payer: Self-pay | Admitting: *Deleted

## 2018-06-10 ENCOUNTER — Ambulatory Visit (HOSPITAL_COMMUNITY)
Admission: RE | Admit: 2018-06-10 | Discharge: 2018-06-10 | Disposition: A | Discharge status: Home / Self Care | Payer: 59 | Source: Ambulatory Visit | Attending: Hematology | Admitting: Hematology

## 2018-06-10 ENCOUNTER — Other Ambulatory Visit: Payer: Self-pay | Admitting: Hematology

## 2018-06-10 VITALS — BP 152/92 | HR 84 | Temp 98.5°F | Resp 18 | Ht 67.5 in | Wt 127.4 lb

## 2018-06-10 DIAGNOSIS — Z79899 Other long term (current) drug therapy: Secondary | ICD-10-CM | POA: Diagnosis not present

## 2018-06-10 DIAGNOSIS — C9000 Multiple myeloma not having achieved remission: Secondary | ICD-10-CM

## 2018-06-10 DIAGNOSIS — M79631 Pain in right forearm: Secondary | ICD-10-CM | POA: Insufficient documentation

## 2018-06-10 DIAGNOSIS — Z7189 Other specified counseling: Secondary | ICD-10-CM

## 2018-06-10 DIAGNOSIS — M7989 Other specified soft tissue disorders: Secondary | ICD-10-CM

## 2018-06-10 DIAGNOSIS — G893 Neoplasm related pain (acute) (chronic): Secondary | ICD-10-CM | POA: Diagnosis not present

## 2018-06-10 DIAGNOSIS — T80818S Extravasation of other vesicant agent, sequela: Secondary | ICD-10-CM

## 2018-06-10 MED ORDER — OXYCODONE HCL 5 MG PO TABS: 5.0000 mg | ORAL_TABLET | ORAL | 0 refills | Status: DC | PRN

## 2018-06-10 MED ORDER — OXYCODONE HCL ER 10 MG PO T12A: 10.0000 mg | EXTENDED_RELEASE_TABLET | Freq: Two times a day (BID) | ORAL | 0 refills | Status: DC

## 2018-06-10 MED ORDER — DOXYCYCLINE HYCLATE 100 MG PO TABS: 100.0000 mg | ORAL_TABLET | Freq: Two times a day (BID) | ORAL | 0 refills | Status: AC

## 2018-06-10 NOTE — Telephone Encounter (Signed)
Called - states he needs refill of pain medication before 1pm today when [place] he uses closes. Also states IV leaked through vein under skin during treatment this past Monday and that his right hand is still swollen. Has been using ice and elevation as directed on Monday, but still painful. Informed him Dr. Irene Limbo will be given information. Per Dr. Irene Limbo, schedule patient to be seen today. Contacted patient who is in agreement and can be here at 12n today. Schedule msg sent.

## 2018-06-10 NOTE — Progress Notes (Signed)
Upper extremity venous has been completed.   Preliminary results in CV Proc.   Abram Sander 06/10/2018 2:22 PM

## 2018-06-10 NOTE — Progress Notes (Signed)
HEMATOLOGY/ONCOLOGY CLINIC NOTE  Date of Service: 06/10/2018  Patient Care Team: Patient, No Pcp Per as PCP - General (General Practice)  CHIEF COMPLAINTS/PURPOSE OF CONSULTATION:  Continued management of Multiple Myeloma  Oncologic History:   Jesus [name] was diagnosed with multiple myeloma in July 2018 in [place]. He initially developed mid back pain in May 2018 and was seen to have a T10 vertebral body mass with additional lytic lesion at T9. His initial M spike in July 2018 was 1.5g with IgG Lambda specificity and IgG elevated at 2213m. His July 2018 BM Bx revealed 20% monoclonal plasma cells and FISH revealed a 17p deletion. He received 4 fractions of palliative RT, however his back pain worsened and developed bilateral lower extremity numbness and tingling, and was then seen to have a T10 pathologic fracture with cord compression s/p T10 corpectomy. The pt began VRd on 09/24/16, completed 7 cycles, then [facility], post transplant complicated by neutropenic colitis and deconditioning. Repeat BM Bx on 04/21/17 revealed residual plasma cell with less than 5% lambda light chain restricted plasma cells, and a post-transplant M spike of 0.4g. The pt then began maintenance 565mm2 Carfilzomib every 2 weeks on 06/16/17.  HISTORY OF PRESENTING ILLNESS:   Jesus ENDERSONs a wonderful 63.0. male who has been referred to [place] Dr. BePhil Doppor evaluation and management of Multiple Myeloma. The pt reports that he is doing well overall.  The [place], the pt has moved back to his home which is here locally, and is transferring his care here now as well. The pt notes that he is anticipating C11D1 maintenance Carfilzomib today. The pt notes that his most recent M  spike was 0.2g. His last BM biopsy was March 2019, as noted above. He denies any infection issues in the last year. The pt notes that he has received all of is post-transplant vaccinations. [facility]  The pt reports that he continues to have back pain. He has been receiving Zometa every 4 weeks, but was recently transitioned to every 3 months, he denies dental concerns at this time. He is taking Calcium and Vitamin D replacement.   The pt has been taking Marinol for appetite stimulation.   The pt notes that he has an enlarged prostate, which was seen on imaging. He notes that he was expecting to receive a PSA test soon.  The pt notes that prior to his Multiple Myeloma diagnosis in July 2018, he had a "clean bill of health." He denies DM, HTN, lung problems, heart problems, thyroid problems or other medical concerns.  Most recent lab results (03/23/18) of CBC is as follows: all values are WNL except for RBC at 4.03, HGB at 11.5, HCT at 35.6%, MPV at 8.9, Glucose at 135, Total Protein at 6.2.  On review of systems, pt reports chronic back pain, stable energy levels, and denies concerns for infections, new pain along the spine, abdominal pains, leg swelling, and any other symptoms.   On PMHx the pt reports Multiple Myeloma, denies HTN, HLD, lung problems, strokes, seizures, heart problems and thyroid problems. On Social Hx the pt reports working for the [employer] development [employer] Interval History:  Jesus [name] returns today for management and evaluation of his multiple myeloma not in remission, and his maintenance Carfilzomib treatment. The patient's last visit with [place] was on 05/23/18. The pt reports that he is doing well overall. The pt is accompanied today by his wife via [employer].  The pt reports that he has been taking two 50m Oxycodone every 4 hours with fair control. He endorses pain on his back right hip and in his central back which  was from his initial surgery. He also endorses left groin pain which began "a few months ago."  The pt notes that he has been keeping his right arm elevated and iced but endorses soreness and swelling in his forearm, which is also warm to the touch.  The pt notes that he has not had a PET/CT scan since his autologous transplant. He notes that his only PET/CT scan was upon initial diagnosis in July 2018. [place] [facility].  Of note since the patient's last visit, pt has had a PET/CT completed on 06/09/18 with results revealing "Multifocal hypermetabolic lytic lesions in the skeleton as noted above, compatible with active myeloma. In the spine, the most notable active lesion of concern is at the T12 level with there is a large right eccentric vertebral body lesion with demineralization of the cortex and likely some paravertebral extension of tumor. This could cause loosening of the right pedicle screw. Intraspinal extension of tumor is difficult to exclude. MRI might be considered although the posterolateral rod and pedicle screws may cause artifact. 2. There is likewise cortical breakthrough associated with the lytic lesion along the left upper acetabulum. 3. A lesion in the left intertrochanteric area of the femur is large enough to potentially cause biomechanical weakening which increases risk of fracture. 4. Other skeletal sites of active involvement are detailed above in the skeleton section. 5. Several lucent lesions are observed including the calvarium, T1 vertebral body, and left L4 pedicle which are not hypermetabolic and may represent effectively previously treated lesions. 6. 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 There is also a lipoma along the right brachialis muscle. 7. Nonspecific subcutaneous edema especially in the right forearm but also extending up into the right upper arm.  Cause is uncertain. There is some low-grade nonfocal metabolic activity along this area. Strictly speaking, cellulitis is not excluded, correlate with clinical history and visual inspection. 8. Other imaging findings of potential clinical significance: Aortic Atherosclerosis. Mild cardiomegaly. Cholelithiasis. Prostatomegaly."  Lab results (06/06/18) of CBC w/diff and CMP is as follows: all values are WNL except for RBC at 4.14, HGB at 12.3, HCT at 36.9, Glucose at 112. 06/06/18 MMP revealed all values WNL except for IgA at 45, M Protein at 0.4g  On review of systems, pt reports left groin pain, right posterior hip pain, right forearm soreness and swelling, and denies right shoulder pain, abdominal pains, leg swelling, and any other symptoms.  MEDICAL HISTORY:  Past Medical History:  Diagnosis Date   Cancer ([facility]     SURGICAL HISTORY: Past Surgical History:  Procedure Laterality Date   BACK SURGERY      SOCIAL HISTORY: Social History   Socioeconomic History   Marital status: Single    Spouse name: Not on file   Number of children: Not on file   Years of education: Not on file   Highest education level: Not on  file  Occupational History   Not on file  Social Needs   Financial resource strain: Not on file   Food insecurity:    Worry: Not on file    Inability: Not on file   Transportation needs:    Medical: Not on file    Non-medical: Not on file  Tobacco Use   Smoking status: Never Smoker   Smokeless tobacco: Never Used  Substance and Sexual Activity   Alcohol use: Never    Frequency: Never   Drug use: Never   Sexual activity: Not on file  Lifestyle   Physical activity:    Days per week: Not on file    Minutes per session: Not on file   Stress: Not on file  Relationships   Social connections:    Talks on phone: Not on file    Gets together: Not on file    Attends religious service: Not on file    Active member of club or organization: Not on file     Attends meetings of clubs or organizations: Not on file    Relationship status: Not on file   Intimate partner violence:    Fear of current or ex partner: Not on file    Emotionally abused: Not on file    Physically abused: Not on file    Forced sexual activity: Not on file  Other Topics Concern   Not on file  Social History Narrative   Not on file    FAMILY HISTORY: No family history on file.  ALLERGIES:  is allergic to penicillins.  MEDICATIONS:  Current Outpatient Medications  Medication Sig Dispense Refill   acyclovir (ZOVIRAX) 400 MG tablet Take 1 tablet (400 mg total) by mouth 2 (two) times daily. 60 tablet 2   Calcium Carb-Cholecalciferol (CALCIUM 1000 + D) 1000-800 MG-UNIT TABS Take 1,000 mg by mouth once.     gabapentin (NEURONTIN) 300 MG capsule Take 1 capsule (300 mg total) by mouth 3 (three) times daily. 90 capsule 2   ondansetron (ZOFRAN) 4 MG tablet Take 1 tablet (4 mg total) by mouth every 8 (eight) hours as needed for nausea or vomiting. 30 tablet 0   oxyCODONE (OXY IR/ROXICODONE) 5 MG immediate release tablet Take 1 tablet (5 mg total) by mouth every 4 (four) hours as needed for severe pain. 90 tablet 0   No current facility-administered medications for this visit.     REVIEW OF SYSTEMS:    A 10+ POINT REVIEW OF SYSTEMS WAS OBTAINED including neurology, dermatology, psychiatry, cardiac, respiratory, lymph, extremities, GI, GU, Musculoskeletal, constitutional, breasts, reproductive, HEENT.  All pertinent positives are noted in the HPI.  All others are negative.   PHYSICAL EXAMINATION: ECOG PERFORMANCE STATUS: 1 - Symptomatic but completely ambulatory  Vitals:   06/10/18 1241  BP: (!) 152/92  Pulse: 84  Resp: 18  Temp: 98.5 F (36.9 C)  SpO2: 100%   Filed Weights   06/10/18 1241  Weight: 127 lb 6.4 oz (57.8 kg)   .Body mass index is 19.66 kg/m.  GENERAL:alert, in no acute distress and comfortable SKIN: no acute rashes, no significant  lesions EYES: conjunctiva are pink and non-injected, sclera anicteric OROPHARYNX: MMM, no exudates, no oropharyngeal erythema or ulceration NECK: supple, no JVD LYMPH:  no palpable lymphadenopathy in the cervical, axillary or inguinal regions LUNGS: clear to auscultation b/l with normal respiratory effort HEART: regular rate & rhythm ABDOMEN:  normoactive bowel sounds , non tender, not distended. No palpable hepatosplenomegaly.  Extremity: no pedal  edema PSYCH: alert & oriented x 3 with fluent speech NEURO: no focal motor/sensory deficits   LABORATORY DATA:  I have reviewed the data as listed  . CBC Latest Ref Rng & Units 06/06/2018 05/23/2018 05/09/2018  WBC 4.0 - 10.5 K/uL 4.7 4.3 4.5  Hemoglobin 13.0 - 17.0 g/dL 12.3(L) 12.4(L) 11.5(L)  Hematocrit 39.0 - 52.0 % 36.9(L) 37.4(L) 35.6(L)  Platelets 150 - 400 K/uL 204 224 223    . CMP Latest Ref Rng & Units 06/06/2018 05/23/2018 05/09/2018  Glucose 70 - 99 mg/dL 112(H) 84 96  BUN 8 - 23 mg/dL '8 9 12  ' Creatinine 0.61 - 1.24 mg/dL 0.76 0.77 0.71  Sodium 135 - 145 mmol/L 138 138 141  Potassium 3.5 - 5.1 mmol/L 3.5 4.1 3.9  Chloride 98 - 111 mmol/L 106 102 106  CO2 22 - 32 mmol/L '22 26 27  ' Calcium 8.9 - 10.3 mg/dL 9.3 9.9 9.4  Total Protein 6.5 - 8.1 g/dL 7.1 7.2 6.6  Total Bilirubin 0.3 - 1.2 mg/dL 0.5 0.7 0.7  Alkaline Phos 38 - 126 U/L 68 70 57  AST 15 - 41 U/L 15 15 12(L)  ALT 0 - 44 U/L '19 17 16     ' RADIOGRAPHIC STUDIES: I have personally reviewed the radiological images as listed and agreed with the findings in the report. Nm Pet Image Restage (ps) Whole Body  Result Date: 06/09/2018 CLINICAL DATA:  Subsequent treatment strategy for multiple myeloma. Back pain, prior T10 corpectomy. Prior care in [place]. EXAM: NUCLEAR MEDICINE PET WHOLE BODY TECHNIQUE: 6.1 mCi F-18 FDG was injected intravenously. Full-ring PET imaging was performed from the skull base to thigh after the radiotracer. CT data was obtained and used for attenuation  correction and anatomic localization. Fasting blood glucose: 99 mg/dl COMPARISON:  None. FINDINGS: Mediastinal blood pool activity: SUV max 2.3 HEAD/NECK: No significant abnormal hypermetabolic activity in this region. Incidental CT findings: none CHEST: No significant abnormal hypermetabolic activity in this region. Incidental CT findings: Mild atherosclerotic calcification of the aortic arch. Mild cardiomegaly. Minimal biapical pleuroparenchymal scarring. Fatty lesion along the right posterior pleural margin, likely representing a small hernia of adipose tissue, less likely a pleural lipoma. ABDOMEN/PELVIS: No significant abnormal hypermetabolic activity in this region. Incidental CT findings: 4 mm gallstone subtle dependently in the gallbladder. Prostatomegaly. SKELETON: Primarily lytic hypermetabolic lesions are present scattered in the skeleton. Visualize lesions include the right C3 vertebral body; the right proximal humeral head and shaft; the right posterior glenoid; the left T2 posterior elements; the left medial clavicle; the right fifth rib laterally; the T12 vertebral body; the right iliac crest; the left upper acetabulum; the left femur intertrochanteric region; and the right femur intertrochanteric region. Index activity in the left upper acetabulum with maximum SUV 11.4 and mild lateral cortical breakthrough. Index activity in the T12 vertebral body with maximum SUV 9.9 and lucency surrounding the right pedicle screw, and right lateral cortical destruction of the vertebral body potentially with some paravertebral extension of tumor. Index activity in the left medial clavicle with maximum SUV 11.6. The lytic lesion in the left intertrochanteric portion of the femur has maximum SUV of 9.0 and is significantly larger than the contralateral hip lesion. Incidental CT findings: There are some small speckled hypodensities in the calvarium but without accentuated metabolic activity. Other lucency such a  lucency in the right side of the T1 vertebral body on image 69/4 likewise do not demonstrate hypermetabolic activity and may represent previously effectively treated lesions. The corpectomy site  is noted some lucency along the right side of the corpectomy site which is not hypermetabolic. A lucent lesion in the left L4 pedicle is likewise not hypermetabolic. EXTREMITIES: Situated between the iloipsoas and the rectus femoris muscle proximally along the anterior margin of the left hip, a 6.8 by 9.0 by 13.6 cm fatty mass is present without accentuated metabolic activity, compatible with lipoma. Likewise there is a lipoma along the margin of the right brachialis muscle just proximal to the elbow for example on image 128/4. There is diffuse subcutaneous edema in the right upper arm and right forearm, especially in the forearm, with low-grade associated nonfocal metabolic activity, significance uncertain. Incidental CT findings: none IMPRESSION: 1. Multifocal hypermetabolic lytic lesions in the skeleton as noted above, compatible with active myeloma. In the spine, the most notable active lesion of concern is at the T12 level with there is a large right eccentric vertebral body lesion with demineralization of the cortex and likely some paravertebral extension of tumor. This could cause loosening of the right pedicle screw. Intraspinal extension of tumor is difficult to exclude. MRI might be considered although the posterolateral rod and pedicle screws may cause artifact. 2. There is likewise cortical breakthrough associated with the lytic lesion along the left upper acetabulum. 3. A lesion in the left intertrochanteric area of the femur is large enough to potentially cause biomechanical weakening which increases risk of fracture. 4. Other skeletal sites of active involvement are detailed above in the skeleton section. 5. Several lucent lesions are observed including the calvarium, T1 vertebral body, and left L4 pedicle  which are not hypermetabolic and may represent effectively previously treated lesions. 6. 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 There is also a lipoma along the right brachialis muscle. 7. Nonspecific subcutaneous edema especially in the right forearm but also extending up into the right upper arm. Cause is uncertain. There is some low-grade nonfocal metabolic activity along this area. Strictly speaking, cellulitis is not excluded, correlate with clinical history and visual inspection. 8. Other imaging findings of potential clinical significance: Aortic Atherosclerosis (ICD10-I70.0). Mild cardiomegaly. Cholelithiasis. Prostatomegaly. Electronically Signed   By: Van Clines M.D.   On: 06/09/2018 15:14    ASSESSMENT & PLAN:  63 y.o. male with  1. 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 IgG at 2220. 08/11/16 PET/CT revealed Hypermetabolic large soft tissue mass in the lower thoracic paraspinal region associated with lytic destruction of T10 vertebral body, concerning for multiple myeloma. Additional smaller lytic lesions involving the skeleton. S/p RT x 4 fractions, discontinued due to T10 pathologic fracture with severe cord compression S/p 08/26/16 T10 corpectomy and T8-L1 posterior spinal fusion   Began 7 cycles of VRd on 09/24/16 S/p autologous stem cell transplant, Day 0 on 02/23/17 04/21/17 BM Bx with residual less than 5% lambda light chain restricted plasma cells in the bone marrow. M Spike at 0.4g Began maintenance 101m/m2 Carfilzomib every 2 weeks on 06/16/17  PLAN: -Discussed pt labwork from 06/06/18; blood counts and chemistries are stable. M Protein increased from 0.2g one month ago to 0.4g. -Discussed the 06/09/18 PET/CT which  revealed "Multifocal hypermetabolic lytic lesions in the skeleton as noted above, compatible with active myeloma. In the spine, the most notable active lesion  of concern is at the T12 level with there is a large right eccentric vertebral body lesion with demineralization of the cortex and likely some paravertebral extension of tumor. This could cause loosening of the right pedicle screw. Intraspinal extension of tumor is difficult to exclude. MRI might be considered although the posterolateral rod and pedicle screws may cause artifact. 2. There is likewise cortical breakthrough associated with the lytic lesion along the left upper acetabulum. 3. A lesion in the left intertrochanteric area of the femur is large enough to potentially cause biomechanical weakening which increases risk of fracture. 4. Other skeletal sites of active involvement are detailed above in the skeleton section. 5. Several lucent lesions are observed including the calvarium, T1 vertebral body, and left L4 pedicle which are not hypermetabolic and may represent effectively previously treated lesions. 6. 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 There is also a lipoma along the right brachialis muscle. 7. Nonspecific subcutaneous edema especially in the right forearm but also extending up into the right upper arm. Cause is uncertain. There is some low-grade nonfocal metabolic activity along this area. Strictly speaking, cellulitis is not excluded, correlate with clinical history and visual inspection. 8. Other imaging findings of potential clinical significance: Aortic Atherosclerosis. Mild cardiomegaly. Cholelithiasis. Prostatomegaly." -Discussed that the PET/CT reveals disease that is disproportionate to the patient's blood tests which have most recently revealed an M spike of 0.4g on 06/06/18, and he initially had an M spike of 1.5g on initial diagnosis in July 2018, with 20%  bone marrow involvement. -Discussed that in light of these findings, it is indicated that the pt has been progressing through maintenance Carfilzomib, and necessitates transitioning to new treatment which the pt agrees with. -Will order BM Bx to get new baseline and complete characterization -Will switch pt to Pomalidomide, Daratumumab and Dexamethasone -Will refer the pt to IR for port placement -Discussed that I am also recommending a referral for a second opinion at [place] to on-board transplant team in his care -Will refer the pt to [place] for RT to spine tumor and other involved sites -Will need oncologic orthopedic service given spine involvement and possible prophylactic hip pin, and will refer pt to [facility] Dr Janice Coffin. -Stressed that pt is to avoid bending, pushing, pulling, lifting, and climbing stairs as much as possible to avoid potential fractures  -Will begin Oxycontin for smoother pain control, and pt can continue 47m Oxycodone for break through pain -Will order [place] left upper extremity for suspected thrombophlebitis -Antibiotic course as well - doxycycline -Zometa every 12 weeks -Advised that pt establish care with a PCP locally -Will see the pt back in 2 weeks   [place] extremity today Referral to radiation oncology urgently for symptomatic lesions from myeloma [facility]- early next week by IR Port a cath placement by IR Chemo-counseling for Daratumumab/Pomalidomide Schedule to start Daratumumab/Pomalidomide in 7 days with labs RTC with Dr KIrene Limbo2nd dose of Daratumumab for toxicity check   All of the patients questions were answered with apparent satisfaction. The patient knows to call the clinic with any problems, questions or concerns.  The total time spent in the appt was 45 minutes and more than 50% was on counseling and direct patient cares.    GSullivan LoneMD MIlaAAHIVMS SNorth Spring [facility] CGypsy Lane Endoscopy Suites IncHematology/Oncology Physician CPalo Cedro (Office):  [phone] (Work cell):  [phone] (Fax):           [phone]  06/10/2018 1:38 PM  I, Baldwin Jamaica, am acting as a scribe for Dr. Sullivan Lone.   .I have reviewed the above documentation for accuracy and completeness, and I agree with the above. Brunetta Genera MD

## 2018-06-13 ENCOUNTER — Telehealth: Payer: Self-pay | Admitting: *Deleted

## 2018-06-13 ENCOUNTER — Telehealth: Payer: Self-pay | Admitting: Hematology

## 2018-06-13 NOTE — Telephone Encounter (Signed)
Scheduled appt per 5/8 los.  No referrals were entered in as of 5/11. No treatment plan is active as of 5/11  Spoke with patient and patient is aware of his new appt date and time.

## 2018-06-13 NOTE — Telephone Encounter (Signed)
Referral/MR faxed to [facility]; RI [phone]

## 2018-06-13 NOTE — Telephone Encounter (Signed)
Contacted office of Dr. Gara Kroner [facility] (Ortho) at [place] at Dr. Grier Mitts request to initiate Referral for patient. [phone]. Spoke with Leonides Sake. Was asked to fax the following to [phone]: Demographic information, recent PET/imaging results (and have patient bring disc to appt), recent lab/pathology results, mark it to attn "Referral".  Information faxed to [facility] at number above. Fax confirmation received.  Dr. Melvyn Neth office will contact patient to make appt.   Radiology at [facility] contacted - will make disc of recent PET for patient and push results through electronically so that they can be viewed by [facility]. Will contact patient to pick up disc from [facility] once notified that it's ready.  Contacted  Dr.Johnson's office [phone] at [facility] in [place], (patient's previous MD) to request copies of any imaging scans completed while in [place]. Spoke with June in Medical Records. They will send results from CT 06/18/17 and PET/CT 08/11/16 on disc to Dr. Grier Mitts office. Gave all office contact info, inc address/phone/fax.

## 2018-06-13 NOTE — Telephone Encounter (Signed)
Per [facility] unable to transfer PET 5/7 to CD due to equipment being down. PET images uploaded to system so that MD at [facility] can see the images.

## 2018-06-14 ENCOUNTER — Encounter (HOSPITAL_COMMUNITY): Payer: 59

## 2018-06-15 ENCOUNTER — Telehealth: Payer: Self-pay

## 2018-06-15 ENCOUNTER — Telehealth: Payer: Self-pay | Admitting: Pharmacist

## 2018-06-15 MED ORDER — ONDANSETRON HCL 8 MG PO TABS: 8.0000 mg | ORAL_TABLET | Freq: Two times a day (BID) | ORAL | 1 refills | Status: DC | PRN

## 2018-06-15 MED ORDER — POMALIDOMIDE 2 MG PO CAPS: 2.0000 mg | ORAL_CAPSULE | Freq: Every day | ORAL | 2 refills | Status: DC

## 2018-06-15 MED ORDER — PROCHLORPERAZINE MALEATE 10 MG PO TABS: 10.0000 mg | ORAL_TABLET | Freq: Four times a day (QID) | ORAL | 1 refills | Status: DC | PRN

## 2018-06-15 MED ORDER — LIDOCAINE-PRILOCAINE 2.5-2.5 % EX CREA: TOPICAL_CREAM | CUTANEOUS | 3 refills | Status: DC

## 2018-06-15 MED ORDER — ACYCLOVIR 400 MG PO TABS: 400.0000 mg | ORAL_TABLET | Freq: Two times a day (BID) | ORAL | 11 refills | Status: DC

## 2018-06-15 MED ORDER — DEXAMETHASONE 4 MG PO TABS: ORAL_TABLET | ORAL | 1 refills | Status: DC

## 2018-06-15 NOTE — Progress Notes (Signed)
DISCONTINUE OFF PATHWAY REGIMEN - Multiple Myeloma and Other Plasma Cell Dyscrasias   [id number]:Carfilzomib 20/56 mg/m2 Monotherapy q28 Days:   A cycle is every 28 days:     Carfilzomib      Carfilzomib      Carfilzomib      Carfilzomib   **Always confirm dose/schedule in your pharmacy ordering system**  REASON: Disease Progression PRIOR TREATMENT: Off Pathway: Carfilzomib 20/56 mg/m2 Monotherapy q28 Days TREATMENT RESPONSE: Stable Disease (SD)  START ON PATHWAY REGIMEN - Multiple Myeloma and Other Plasma Cell Dyscrasias     Cycles 1 and 2: A cycle is every 28 days:     Pomalidomide      Dexamethasone      Daratumumab    Cycles 3 through 6: A cycle is every 28 days:     Pomalidomide      Dexamethasone      Dexamethasone      Daratumumab    Cycles 7 and beyond: A cycle is every 28 days:     Pomalidomide      Dexamethasone      Dexamethasone      Daratumumab   **Always confirm dose/schedule in your pharmacy ordering system**  Patient Characteristics: Relapsed / Refractory, Second through Fourth Lines of Therapy R-ISS Staging: III Disease Classification: Relapsed Line of Therapy: Third Line Intent of Therapy: Non-Curative / Palliative Intent, Discussed with Patient 

## 2018-06-15 NOTE — Telephone Encounter (Signed)
Oral Oncology Patient Advocate Encounter  Received notification from [employer] that prior authorization for Pomalyst is required.  PA submitted on CoverMyMeds Key [id number] Status is pending  Oral Oncology Clinic will continue to follow.  [place] [facility] [place] Phone [phone] Fax [phone] 06/15/2018    10:11 AM

## 2018-06-15 NOTE — Telephone Encounter (Signed)
Oral Oncology Patient Advocate Encounter  Prior Authorization for Pomlayst has been approved.    PA# [id number] Effective dates: 06/15/18 through 06/15/19  Oral Oncology Clinic will continue to follow.   Pomlayst must be filled at [facility] per insurance requirement.  [facility] [name] Phone [phone] Fax [phone] 06/15/2018    10:53 AM

## 2018-06-15 NOTE — Progress Notes (Signed)
OFF PATHWAY REGIMEN - Multiple Myeloma and Other Plasma Cell Dyscrasias  No Change  Continue With Treatment as Ordered.   [id number]:Carfilzomib 20/56 mg/m2 Monotherapy q28 Days:   A cycle is every 28 days:     Carfilzomib      Carfilzomib      Carfilzomib      Carfilzomib   **Always confirm dose/schedule in your pharmacy ordering system**  Patient Characteristics: Maintenance Therapy R-ISS Staging: III Disease Classification: Maintenance Therapy Intent of Therapy: Non-Curative / Palliative Intent, Discussed with Patient

## 2018-06-15 NOTE — Telephone Encounter (Signed)
Oral Oncology Pharmacist Encounter  Received new prescription for Pomalyst (pomalidomide) for the treatment of multiple myeloma in relapse, with 17p deletion, in conjunction with Darzalex (daratumumab) and dexamethasone, planned duration until disease progression or unacceptable toxicity.  Original diagnosis in July 2018, patient has been under the care on an oncologist in [place], and is now transferring care locally. 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  Pomalyst is planned to be administered at 73m by mouth once daily for 21 days on, 7 days off, repeated every 28 days.  Labs from 06/06/2018 assessed, OK for treatment initiation.  Current medication list in Epic reviewed, no significant DDIs with Pomalyst identified.  Noted dexamethasone is planned to be administered IV with each dose of Darzalex as well as at 29mby mouth the day following Darzalex administration. [facility] Noted acyclovir 40037mID prescription sent to same pharmacy.  No agent for thromboprophylaxis noted on patient's medication list. I will discuss with patient addition of aspirin 34m14m mouth once daily during initial counseling session.  Prescription for Pomalyst will be e-scribed to appropriate specialty pharmacy for dispensing once insurance authorization is approved. Pomalyst [facility] not [facility] is a limited distribution medication.  No [employer] authorization number noted on referral prescription. I have sent a message to  collaborative practice RN to obtain this so that Pomalyst prescription can be processed at a dispensing pharmacy.  Oral Oncology Clinic will continue to follow for insurance authorization, copayment issues, initial counseling and start date.  JessJohny DrillingarmD, BCPS, BCOP  06/15/2018 8:13 AM Oral Oncology Clinic [phone]

## 2018-06-16 ENCOUNTER — Encounter: Payer: Self-pay | Admitting: Pharmacist

## 2018-06-16 ENCOUNTER — Other Ambulatory Visit: Payer: Self-pay | Admitting: Hematology

## 2018-06-16 ENCOUNTER — Inpatient Hospital Stay: Payer: 59

## 2018-06-16 ENCOUNTER — Telehealth: Payer: Self-pay | Admitting: Hematology

## 2018-06-16 DIAGNOSIS — Z7189 Other specified counseling: Secondary | ICD-10-CM

## 2018-06-16 DIAGNOSIS — C9000 Multiple myeloma not having achieved remission: Secondary | ICD-10-CM

## 2018-06-16 MED ORDER — POMALIDOMIDE 2 MG PO CAPS: 2.0000 mg | ORAL_CAPSULE | Freq: Every day | ORAL | 2 refills | Status: DC

## 2018-06-16 NOTE — Telephone Encounter (Signed)
Added lab for 5/15 prior to tx per 5/14 schedule message. Confirmed with patient.

## 2018-06-17 ENCOUNTER — Inpatient Hospital Stay: Payer: 59

## 2018-06-17 ENCOUNTER — Other Ambulatory Visit: Payer: Self-pay

## 2018-06-17 ENCOUNTER — Telehealth: Payer: Self-pay | Admitting: *Deleted

## 2018-06-17 ENCOUNTER — Other Ambulatory Visit: Payer: Self-pay | Admitting: Hematology

## 2018-06-17 VITALS — BP 137/84 | HR 111 | Temp 99.0°F | Resp 18

## 2018-06-17 DIAGNOSIS — C9000 Multiple myeloma not having achieved remission: Secondary | ICD-10-CM

## 2018-06-17 DIAGNOSIS — Z7189 Other specified counseling: Secondary | ICD-10-CM

## 2018-06-17 LAB — CBC WITH DIFFERENTIAL (CANCER CENTER ONLY)
Abs Immature Granulocytes: 0.03 10*3/uL (ref 0.00–0.07)
Basophils Absolute: 0 10*3/uL (ref 0.0–0.1)
Basophils Relative: 0 %
Eosinophils Absolute: 0.1 10*3/uL (ref 0.0–0.5)
Eosinophils Relative: 1 %
HCT: 39.4 % (ref 39.0–52.0)
Hemoglobin: 12.7 g/dL — ABNORMAL LOW (ref 13.0–17.0)
Immature Granulocytes: 1 %
Lymphocytes Relative: 27 %
Lymphs Abs: 1.4 10*3/uL (ref 0.7–4.0)
MCH: 29.2 pg (ref 26.0–34.0)
MCHC: 32.2 g/dL (ref 30.0–36.0)
MCV: 90.6 fL (ref 80.0–100.0)
Monocytes Absolute: 0.6 10*3/uL (ref 0.1–1.0)
Monocytes Relative: 12 %
Neutro Abs: 3 10*3/uL (ref 1.7–7.7)
Neutrophils Relative %: 59 %
Platelet Count: 243 10*3/uL (ref 150–400)
RBC: 4.35 MIL/uL (ref 4.22–5.81)
RDW: 14.1 % (ref 11.5–15.5)
WBC Count: 5.1 10*3/uL (ref 4.0–10.5)
nRBC: 0 % (ref 0.0–0.2)

## 2018-06-17 LAB — ABO/RH: ABO/RH(D): A POS

## 2018-06-17 LAB — CMP (CANCER CENTER ONLY)
ALT: 58 U/L — ABNORMAL HIGH (ref 0–44)
AST: 39 U/L (ref 15–41)
Albumin: 4.2 g/dL (ref 3.5–5.0)
Alkaline Phosphatase: 108 U/L (ref 38–126)
Anion gap: 10 (ref 5–15)
BUN: 9 mg/dL (ref 8–23)
CO2: 27 mmol/L (ref 22–32)
Calcium: 10.1 mg/dL (ref 8.9–10.3)
Chloride: 101 mmol/L (ref 98–111)
Creatinine: 0.8 mg/dL (ref 0.61–1.24)
GFR, Est AFR Am: >60 mL/min (ref >60)
GFR, Estimated: >60 mL/min (ref >60)
Glucose, Bld: 104 mg/dL — ABNORMAL HIGH (ref 70–99)
Potassium: 4.2 mmol/L (ref 3.5–5.1)
Sodium: 138 mmol/L (ref 135–145)
Total Bilirubin: 0.4 mg/dL (ref 0.3–1.2)
Total Protein: 7.6 g/dL (ref 6.5–8.1)

## 2018-06-17 LAB — TYPE AND SCREEN
ABO/RH(D): A POS
Antibody Screen: NEGATIVE

## 2018-06-17 LAB — PRETREATMENT RBC PHENOTYPE

## 2018-06-17 MED ORDER — SODIUM CHLORIDE 0.9 % IV SOLN
20.0000 mg | Freq: Once | INTRAVENOUS | Status: AC
  Administered 2018-06-17: 10:00:00 20 mg via INTRAVENOUS
  Filled 2018-06-17: qty 20

## 2018-06-17 MED ORDER — FAMOTIDINE IN NACL 20-0.9 MG/50ML-% IV SOLN
INTRAVENOUS | Status: AC
  Filled 2018-06-17: qty 50

## 2018-06-17 MED ORDER — MONTELUKAST SODIUM 10 MG PO TABS
ORAL_TABLET | ORAL | Status: AC
  Filled 2018-06-17: qty 1

## 2018-06-17 MED ORDER — MORPHINE SULFATE ER 15 MG PO TBCR: 15.0000 mg | EXTENDED_RELEASE_TABLET | Freq: Two times a day (BID) | ORAL | 0 refills | Status: DC

## 2018-06-17 MED ORDER — ACETAMINOPHEN 325 MG PO TABS
ORAL_TABLET | ORAL | Status: AC
  Filled 2018-06-17: qty 2

## 2018-06-17 MED ORDER — OXYCODONE HCL 5 MG PO TABS: 5.0000 mg | ORAL_TABLET | ORAL | 0 refills | Status: DC | PRN

## 2018-06-17 MED ORDER — MONTELUKAST SODIUM 10 MG PO TABS
10.0000 mg | ORAL_TABLET | Freq: Once | ORAL | Status: AC
  Administered 2018-06-17: 09:00:00 10 mg via ORAL

## 2018-06-17 MED ORDER — SODIUM CHLORIDE 0.9 % IV SOLN
15.4000 mg/kg | Freq: Once | INTRAVENOUS | Status: AC
  Administered 2018-06-17: 11:00:00 900 mg via INTRAVENOUS
  Filled 2018-06-17: qty 40

## 2018-06-17 MED ORDER — SODIUM CHLORIDE 0.9 % IV SOLN
Freq: Once | INTRAVENOUS | Status: AC
  Administered 2018-06-17: 09:00:00 via INTRAVENOUS
  Filled 2018-06-17: qty 250

## 2018-06-17 MED ORDER — FAMOTIDINE IN NACL 20-0.9 MG/50ML-% IV SOLN
20.0000 mg | Freq: Once | INTRAVENOUS | Status: AC
  Administered 2018-06-17: 09:00:00 20 mg via INTRAVENOUS

## 2018-06-17 MED ORDER — DIPHENHYDRAMINE HCL 25 MG PO CAPS
50.0000 mg | ORAL_CAPSULE | Freq: Once | ORAL | Status: AC
  Administered 2018-06-17: 09:00:00 50 mg via ORAL

## 2018-06-17 MED ORDER — ACETAMINOPHEN 325 MG PO TABS
650.0000 mg | ORAL_TABLET | Freq: Once | ORAL | Status: AC
  Administered 2018-06-17: 09:00:00 650 mg via ORAL

## 2018-06-17 MED ORDER — DIPHENHYDRAMINE HCL 25 MG PO CAPS
ORAL_CAPSULE | ORAL | Status: AC
  Filled 2018-06-17: qty 2

## 2018-06-17 NOTE — Progress Notes (Signed)
Stopped by infusion room today & gave pt ed packet & asked if he had any questions.  He reports that he does not.

## 2018-06-17 NOTE — Telephone Encounter (Signed)
Patient/MD completed/signed Patient/Physician Agreement Form for [employer]-Pomalyst REMs. Patient received copy of signed agreement. Form faxed to [employer] at [phone]. Fax confirmation received. [employer] auth# [id number] assigned after completion of survey

## 2018-06-17 NOTE — Patient Instructions (Signed)
[facility] Discharge Instructions for Patients Receiving Chemotherapy  Today you received the following chemotherapy agents Darzalex  To help prevent nausea and vomiting after your treatment, we encourage you to take your nausea medication as prescribed    If you develop nausea and vomiting that is not controlled by your nausea medication, call the clinic.   BELOW ARE SYMPTOMS THAT SHOULD BE REPORTED IMMEDIATELY:  *FEVER GREATER THAN 100.5 F  *CHILLS WITH OR WITHOUT FEVER  NAUSEA AND VOMITING THAT IS NOT CONTROLLED WITH YOUR NAUSEA MEDICATION  *UNUSUAL SHORTNESS OF BREATH  *UNUSUAL BRUISING OR BLEEDING  TENDERNESS IN MOUTH AND THROAT WITH OR WITHOUT PRESENCE OF ULCERS  *URINARY PROBLEMS  *BOWEL PROBLEMS  UNUSUAL RASH Items with * indicate a potential emergency and should be followed up as soon as possible.  Feel free to call the clinic should you have any questions or concerns. The clinic phone number is [phone].  Please show the CHEMO ALERT CARD at check-in to the Emergency Department and triage nurse.  Daratumumab injection What is this medicine? DARATUMUMAB (dar a toom ue mab) is a monoclonal antibody. It is used to treat multiple myeloma. This medicine may be used for other purposes; ask your health care provider or pharmacist if you have questions. COMMON BRAND NAME(S): DARZALEX What should I tell my health care provider before I take this medicine? They need to know if you have any of these conditions: -infection (especially a virus infection such as chickenpox, herpes, or hepatitis B virus) -lung or breathing disease -an unusual or allergic reaction to daratumumab, other medicines, foods, dyes, or preservatives -pregnant or trying to get pregnant -breast-feeding How should I use this medicine? This medicine is for infusion into a vein. It is given by a health care professional in a hospital or clinic setting. Talk to your pediatrician  regarding the use of this medicine in children. Special care may be needed. Overdosage: If you think you have taken too much of this medicine contact a poison control center or emergency room at once. NOTE: This medicine is only for you. Do not share this medicine with others. What if I miss a dose? Keep appointments for follow-up doses as directed. It is important not to miss your dose. Call your doctor or health care professional if you are unable to keep an appointment. What may interact with this medicine? Interactions have not been studied. Give your health care provider a list of all the medicines, herbs, non-prescription drugs, or dietary supplements you use. Also tell them if you smoke, drink alcohol, or use illegal drugs. Some items may interact with your medicine. This list may not describe all possible interactions. Give your health care provider a list of all the medicines, herbs, non-prescription drugs, or dietary supplements you use. Also tell them if you smoke, drink alcohol, or use illegal drugs. Some items may interact with your medicine. What should I watch for while using this medicine? This drug may make you feel generally unwell. Report any side effects. Continue your course of treatment even though you feel ill unless your doctor tells you to stop. This medicine can cause serious allergic reactions. To reduce your risk you may need to take medicine before treatment with this medicine. Take your medicine as directed. This medicine can affect the results of blood tests to match your blood type. These changes can last for up to 6 months after the final dose. Your healthcare provider will do blood tests to match your   blood type before you start treatment. Tell all of your healthcare providers that you are being treated with this medicine before receiving a blood transfusion. This medicine can affect the results of some tests used to determine treatment response; extra tests may be  needed to evaluate response. Do not become pregnant while taking this medicine or for 3 months after stopping it. Women should inform their doctor if they wish to become pregnant or think they might be pregnant. There is a potential for serious side effects to an unborn child. Talk to your health care professional or pharmacist for more information. What side effects may I notice from receiving this medicine? Side effects that you should report to your doctor or health care professional as soon as possible: -allergic reactions like skin rash, itching or hives, swelling of the face, lips, or tongue -breathing problems -chills -cough -dizziness -feeling faint or lightheaded -headache -low blood counts - this medicine may decrease the number of white blood cells, red blood cells and platelets. You may be at increased risk for infections and bleeding. -nausea, vomiting -shortness of breath -signs of decreased platelets or bleeding - bruising, pinpoint red spots on the skin, black, tarry stools, blood in the urine -signs of decreased red blood cells - unusually weak or tired, feeling faint or lightheaded, falls -signs of infection - fever or chills, cough, sore throat, pain or difficulty passing urine -signs and symptoms of liver injury like dark yellow or brown urine; general ill feeling or flu-like symptoms; light-colored stools; loss of appetite; right upper belly pain; unusually weak or tired; yellowing of the eyes or skin Side effects that usually do not require medical attention (report to your doctor or health care professional if they continue or are bothersome): -back pain -constipation -loss of appetite -diarrhea -joint pain -muscle cramps -pain, tingling, numbness in the hands or feet -swelling of the ankles, feet, hands -tiredness -trouble sleeping This list may not describe all possible side effects. Call your doctor for medical advice about side effects. You may report side  effects to FDA at 1-800-FDA-1088. Where should I keep my medicine? Keep out of the reach of children. This drug is given in a hospital or clinic and will not be stored at home. NOTE: This sheet is a summary. It may not cover all possible information. If you have questions about this medicine, talk to your doctor, pharmacist, or health care provider.  2019 Elsevier/Gold Standard (2017-08-20 15:52:44)

## 2018-06-20 ENCOUNTER — Other Ambulatory Visit: Payer: Self-pay | Admitting: Radiology

## 2018-06-20 ENCOUNTER — Ambulatory Visit: Payer: 59 | Admitting: Hematology

## 2018-06-20 ENCOUNTER — Other Ambulatory Visit: Payer: 59

## 2018-06-20 ENCOUNTER — Ambulatory Visit: Payer: 59

## 2018-06-20 ENCOUNTER — Telehealth: Payer: Self-pay | Admitting: *Deleted

## 2018-06-20 ENCOUNTER — Telehealth: Payer: Self-pay | Admitting: Pharmacist

## 2018-06-20 DIAGNOSIS — M899 Disorder of bone, unspecified: Secondary | ICD-10-CM | POA: Insufficient documentation

## 2018-06-20 DIAGNOSIS — Z86010 Personal history of colonic polyps: Secondary | ICD-10-CM | POA: Insufficient documentation

## 2018-06-20 DIAGNOSIS — IMO0002 Reserved for concepts with insufficient information to code with codable children: Secondary | ICD-10-CM | POA: Insufficient documentation

## 2018-06-20 DIAGNOSIS — C9000 Multiple myeloma not having achieved remission: Secondary | ICD-10-CM

## 2018-06-20 DIAGNOSIS — Z7189 Other specified counseling: Secondary | ICD-10-CM

## 2018-06-20 LAB — MULTIPLE MYELOMA PANEL, SERUM
Albumin SerPl Elph-Mcnc: 4.3 g/dL (ref 2.9–4.4)
Albumin/Glob SerPl: 1.5 (ref 0.7–1.7)
Alpha 1: 0.4 g/dL (ref 0.0–0.4)
Alpha2 Glob SerPl Elph-Mcnc: 0.8 g/dL (ref 0.4–1.0)
B-Globulin SerPl Elph-Mcnc: 1 g/dL (ref 0.7–1.3)
Gamma Glob SerPl Elph-Mcnc: 0.8 g/dL (ref 0.4–1.8)
Globulin, Total: 2.9 g/dL (ref 2.2–3.9)
IgA: 47 mg/dL — ABNORMAL LOW (ref 61–437)
IgG (Immunoglobin G), Serum: 942 mg/dL (ref 603–1613)
IgM (Immunoglobulin M), Srm: 53 mg/dL (ref 20–172)
M Protein SerPl Elph-Mcnc: 0.5 g/dL — ABNORMAL HIGH
Total Protein ELP: 7.2 g/dL (ref 6.0–8.5)

## 2018-06-20 MED ORDER — POMALIDOMIDE 2 MG PO CAPS: 2.0000 mg | ORAL_CAPSULE | Freq: Every day | ORAL | 0 refills | Status: DC

## 2018-06-20 NOTE — Telephone Encounter (Signed)
Oral Oncology Pharmacist Encounter  Received [employer] authorization number for Pomalyst prescription.  Pomalyst prescription to take 2mg  once daily for 21 days on, 7 days orr, quantity #21, refills=0, has been e-scribed to [facility] specialty pharmacy in [place], [place].  Supporting information including insurance information, demographic information, medication list, copayment grant information, and insurance authorization has been faxed to dispensing pharmacy.   I will reach out to the patient to discuss initial counseling and site of dispensing once I confirm pharmacy is able to process patient's claim.  Johny Drilling, PharmD, BCPS, BCOP  06/20/2018 12:05 PM Oral Oncology Clinic [phone]

## 2018-06-20 NOTE — Telephone Encounter (Signed)
Received call from Amy @ [facility] stating that pt's prescription for MS Contin 15 mg needs prior authorization before it can be filled. Please contact [phone] for PA.  Pt is requesting to be called when authorization is completed. Thank you.

## 2018-06-21 ENCOUNTER — Encounter (HOSPITAL_COMMUNITY): Payer: Self-pay | Admitting: Interventional Radiology

## 2018-06-21 ENCOUNTER — Ambulatory Visit (HOSPITAL_COMMUNITY)
Admission: RE | Admit: 2018-06-21 | Discharge: 2018-06-21 | Disposition: A | Discharge status: Home / Self Care | Payer: 59 | Source: Ambulatory Visit | Attending: Hematology | Admitting: Hematology

## 2018-06-21 ENCOUNTER — Other Ambulatory Visit: Payer: Self-pay

## 2018-06-21 DIAGNOSIS — Z79899 Other long term (current) drug therapy: Secondary | ICD-10-CM | POA: Diagnosis not present

## 2018-06-21 DIAGNOSIS — C9000 Multiple myeloma not having achieved remission: Secondary | ICD-10-CM

## 2018-06-21 DIAGNOSIS — C9002 Multiple myeloma in relapse: Secondary | ICD-10-CM | POA: Diagnosis present

## 2018-06-21 HISTORY — PX: IR IMAGING GUIDED PORT INSERTION: IMG5740

## 2018-06-21 LAB — PROTIME-INR
INR: 1 (ref 0.8–1.2)
Prothrombin Time: 12.7 seconds (ref 11.4–15.2)

## 2018-06-21 MED ORDER — MIDAZOLAM HCL 2 MG/2ML IJ SOLN
INTRAMUSCULAR | Status: AC | PRN
  Administered 2018-06-21 (×4): 1 mg via INTRAVENOUS

## 2018-06-21 MED ORDER — MIDAZOLAM HCL 2 MG/2ML IJ SOLN
INTRAMUSCULAR | Status: AC
  Filled 2018-06-21: qty 4

## 2018-06-21 MED ORDER — FENTANYL CITRATE (PF) 100 MCG/2ML IJ SOLN
INTRAMUSCULAR | Status: AC | PRN
  Administered 2018-06-21 (×2): 50 ug via INTRAVENOUS

## 2018-06-21 MED ORDER — FENTANYL CITRATE (PF) 100 MCG/2ML IJ SOLN
INTRAMUSCULAR | Status: AC
  Filled 2018-06-21: qty 2

## 2018-06-21 MED ORDER — VANCOMYCIN HCL IN DEXTROSE 1-5 GM/200ML-% IV SOLN
1000.0000 mg | INTRAVENOUS | Status: AC
  Administered 2018-06-21: 10:00:00 1000 mg via INTRAVENOUS

## 2018-06-21 MED ORDER — SODIUM CHLORIDE 0.9 % IV SOLN
INTRAVENOUS | Status: DC
  Administered 2018-06-21: 09:00:00 via INTRAVENOUS

## 2018-06-21 MED ORDER — VANCOMYCIN HCL IN DEXTROSE 1-5 GM/200ML-% IV SOLN
INTRAVENOUS | Status: AC
  Administered 2018-06-21: 10:00:00 1000 mg via INTRAVENOUS
  Filled 2018-06-21: qty 200

## 2018-06-21 MED ORDER — LIDOCAINE-EPINEPHRINE 1 %-1:100000 IJ SOLN
INTRAMUSCULAR | Status: AC
  Filled 2018-06-21: qty 1

## 2018-06-21 MED ORDER — HEPARIN SOD (PORK) LOCK FLUSH 100 UNIT/ML IV SOLN
INTRAVENOUS | Status: AC
  Filled 2018-06-21: qty 5

## 2018-06-21 NOTE — Discharge Instructions (Signed)
Moderate Conscious Sedation, Adult, Care After  These instructions provide you with information about caring for yourself after your procedure. Your health care provider may also give you more specific instructions. Your treatment has been planned according to current medical practices, but problems sometimes occur. Call your health care provider if you have any problems or questions after your procedure.  What can I expect after the procedure?  After your procedure, it is common:  · To feel sleepy for several hours.  · To feel clumsy and have poor balance for several hours.  · To have poor judgment for several hours.  · To vomit if you eat too soon.  Follow these instructions at home:  For at least 24 hours after the procedure:    · Do not:  ? Participate in activities where you could fall or become injured.  ? Drive.  ? Use heavy machinery.  ? Drink alcohol.  ? Take sleeping pills or medicines that cause drowsiness.  ? Make important decisions or sign legal documents.  ? Take care of children on your own.  · Rest.  Eating and drinking  · Follow the diet recommended by your health care provider.  · If you vomit:  ? Drink water, juice, or soup when you can drink without vomiting.  ? Make sure you have little or no nausea before eating solid foods.  General instructions  · Have a responsible adult stay with you until you are awake and alert.  · Take over-the-counter and prescription medicines only as told by your health care provider.  · If you smoke, do not smoke without supervision.  · Keep all follow-up visits as told by your health care provider. This is important.  Contact a health care provider if:  · You keep feeling nauseous or you keep vomiting.  · You feel light-headed.  · You develop a rash.  · You have a fever.  Get help right away if:  · You have trouble breathing.  This information is not intended to replace advice given to you by your health care provider. Make sure you discuss any questions you have  with your health care provider.  Document Released: 11/09/2012 Document Revised: 06/24/2015 Document Reviewed: 05/11/2015  Elsevier Interactive Patient Education © 2019 Elsevier Inc.  Implanted Port Insertion, Care After  This sheet gives you information about how to care for yourself after your procedure. Your health care provider may also give you more specific instructions. If you have problems or questions, contact your health care provider.  What can I expect after the procedure?  After the procedure, it is common to have:  · Discomfort at the port insertion site.  · Bruising on the skin over the port. This should improve over 3-4 days.  Follow these instructions at home:  Port care  · After your port is placed, you will get a manufacturer's information card. The card has information about your port. Keep this card with you at all times.  · Take care of the port as told by your health care provider. Ask your health care provider if you or a family member can get training for taking care of the port at home. A home health care nurse may also take care of the port.  · Make sure to remember what type of port you have.  Incision care         · Follow instructions from your health care provider about how to take care of your port insertion   site. Make sure you:  ? Wash your hands with soap and water before and after you change your bandage (dressing). If soap and water are not available, use hand sanitizer.  ? Change your dressing as told by your health care provider.  ? Leave stitches (sutures), skin glue, or adhesive strips in place. These skin closures may need to stay in place for 2 weeks or longer. If adhesive strip edges start to loosen and curl up, you may trim the loose edges. Do not remove adhesive strips completely unless your health care provider tells you to do that.  · Check your port insertion site every day for signs of infection. Check for:  ? Redness, swelling, or pain.  ? Fluid or  blood.  ? Warmth.  ? Pus or a bad smell.  Activity  · Return to your normal activities as told by your health care provider. Ask your health care provider what activities are safe for you.  · Do not lift anything that is heavier than 10 lb (4.5 kg), or the limit that you are told, until your health care provider says that it is safe.  General instructions  · Take over-the-counter and prescription medicines only as told by your health care provider.  · Do not take baths, swim, or use a hot tub until your health care provider approves. Ask your health care provider if you may take showers. You may only be allowed to take sponge baths.  · Do not drive for 24 hours if you were given a sedative during your procedure.  · Wear a medical alert bracelet in case of an emergency. This will tell any health care providers that you have a port.  · Keep all follow-up visits as told by your health care provider. This is important.  Contact a health care provider if:  · You cannot flush your port with saline as directed, or you cannot draw blood from the port.  · You have a fever or chills.  · You have redness, swelling, or pain around your port insertion site.  · You have fluid or blood coming from your port insertion site.  · Your port insertion site feels warm to the touch.  · You have pus or a bad smell coming from the port insertion site.  Get help right away if:  · You have chest pain or shortness of breath.  · You have bleeding from your port that you cannot control.  Summary  · Take care of the port as told by your health care provider. Keep the manufacturer's information card with you at all times.  · Change your dressing as told by your health care provider.  · Contact a health care provider if you have a fever or chills or if you have redness, swelling, or pain around your port insertion site.  · Keep all follow-up visits as told by your health care provider.  This information is not intended to replace advice given to  you by your health care provider. Make sure you discuss any questions you have with your health care provider.  Document Released: 11/09/2012 Document Revised: 08/17/2017 Document Reviewed: 08/17/2017  Elsevier Interactive Patient Education © 2019 Elsevier Inc.

## 2018-06-21 NOTE — Procedures (Signed)
  Procedure: R IJ Port catheter placed   EBL:   minimal Complications:  none immediate  See full dictation in [facility] PACS.  D. Cadel Stairs MD Main # [phone] Pager  [phone]    

## 2018-06-21 NOTE — Consult Note (Signed)
Chief Complaint: Patient was seen in consultation today for port a cath placement  Referring Physician(s): Brunetta Genera  Supervising Physician: Arne Cleveland  Patient Status: [facility] - Out-pt  History of Present Illness: Jesus [name] is a 63 y.o. male with history of relapsing multiple myeloma and poor venous access who presents today for Port-A-Cath placement for additional chemotherapy.  Past Medical History:  Diagnosis Date   Cancer [facility])     Past Surgical History:  Procedure Laterality Date   BACK SURGERY      Allergies: Penicillins  Medications: Prior to Admission medications   Medication Sig Start Date End Date Taking? Authorizing Provider  acyclovir (ZOVIRAX) 400 MG tablet Take 1 tablet (400 mg total) by mouth 2 (two) times daily. 05/10/18   Brunetta Genera, MD  acyclovir (ZOVIRAX) 400 MG tablet Take 1 tablet (400 mg total) by mouth 2 (two) times daily. 06/15/18   Brunetta Genera, MD  Calcium Carb-Cholecalciferol (CALCIUM 1000 + D) 1000-800 MG-UNIT TABS Take 1,000 mg by mouth once.    [provider]  dexamethasone (DECADRON) 4 MG tablet Take 5 tablets (61m) with breakfast the day after each dose of Daratumumab 06/15/18   KBrunetta Genera MD  gabapentin (NEURONTIN) 300 MG capsule Take 1 capsule (300 mg total) by mouth 3 (three) times daily. 06/06/18   Tanner, VLyndon Code, PA-C  lidocaine-prilocaine (EMLA) cream Apply to affected area once 06/15/18   KBrunetta Genera MD  morphine (MS CONTIN) 15 MG 12 hr tablet Take 1 tablet (15 mg total) by mouth every 12 (twelve) hours. 06/17/18   KBrunetta Genera MD  ondansetron (ZOFRAN) 4 MG tablet Take 1 tablet (4 mg total) by mouth every 8 (eight) hours as needed for nausea or vomiting. 04/20/18   KBrunetta Genera MD  ondansetron (ZOFRAN) 8 MG tablet Take 1 tablet (8 mg total) by mouth 2 (two) times daily as needed (Nausea or vomiting). 06/15/18   KBrunetta Genera MD  oxyCODONE (OXY  IR/ROXICODONE) 5 MG immediate release tablet Take 1 tablet (5 mg total) by mouth every 4 (four) hours as needed for severe pain. 06/17/18   KBrunetta Genera MD  pomalidomide (POMALYST) 2 MG capsule Take 1 capsule (2 mg total) by mouth daily. Take with water on days 1-21. Repeat every 28 days. [employer] auth# [id number]   KBrunetta Genera MD  prochlorperazine (COMPAZINE) 10 MG tablet Take 1 tablet (10 mg total) by mouth every 6 (six) hours as needed (Nausea or vomiting). 06/15/18   KBrunetta Genera MD     No family history on file.  Social History   Socioeconomic History   Marital status: Married    Spouse name: Not on file   Number of children: Not on file   Years of education: Not on file   Highest education level: Not on file  Occupational History   Not on file  Social Needs   Financial resource strain: Not on file   Food insecurity:    Worry: Not on file    Inability: Not on file   Transportation needs:    Medical: Not on file    Non-medical: Not on file  Tobacco Use   Smoking status: Never Smoker   Smokeless tobacco: Never Used  Substance and Sexual Activity   Alcohol use: Never    Frequency: Never   Drug use: Never   Sexual activity: Not on file  Lifestyle   Physical activity:    Days per  week: Not on file    Minutes per session: Not on file   Stress: Not on file  Relationships   Social connections:    Talks on phone: Not on file    Gets together: Not on file    Attends religious service: Not on file    Active member of club or organization: Not on file    Attends meetings of clubs or organizations: Not on file    Relationship status: Not on file  Other Topics Concern   Not on file  Social History Narrative   Not on file      Review of Systems currently denies fever, headache, chest pain, dyspnea, cough, abdominal pain, nausea, vomiting or bleeding.  He does have chronic back pain.  Vital Signs: BP 137/85    Pulse 97     Temp 98.7 F (37.1 C) (Oral)    Resp 18    SpO2 100%   Physical Exam awake, alert.  Chest clear to auscultation bilaterally.  Heart with regular rate and rhythm.  Abdomen soft, positive bowel sounds, nontender.  No lower extremity edema.  Imaging: Nm Pet Image Restage (ps) Whole Body  Result Date: 06/09/2018 CLINICAL DATA:  Subsequent treatment strategy for multiple myeloma. Back pain, prior T10 corpectomy. Prior care in [place]. EXAM: NUCLEAR MEDICINE PET WHOLE BODY TECHNIQUE: 6.1 mCi F-18 FDG was injected intravenously. Full-ring PET imaging was performed from the skull base to thigh after the radiotracer. CT data was obtained and used for attenuation correction and anatomic localization. Fasting blood glucose: 99 mg/dl COMPARISON:  None. FINDINGS: Mediastinal blood pool activity: SUV max 2.3 HEAD/NECK: No significant abnormal hypermetabolic activity in this region. Incidental CT findings: none CHEST: No significant abnormal hypermetabolic activity in this region. Incidental CT findings: Mild atherosclerotic calcification of the aortic arch. Mild cardiomegaly. Minimal biapical pleuroparenchymal scarring. Fatty lesion along the right posterior pleural margin, likely representing a small hernia of adipose tissue, less likely a pleural lipoma. ABDOMEN/PELVIS: No significant abnormal hypermetabolic activity in this region. Incidental CT findings: 4 mm gallstone subtle dependently in the gallbladder. Prostatomegaly. SKELETON: Primarily lytic hypermetabolic lesions are present scattered in the skeleton. Visualize lesions include the right C3 vertebral body; the right proximal humeral head and shaft; the right posterior glenoid; the left T2 posterior elements; the left medial clavicle; the right fifth rib laterally; the T12 vertebral body; the right iliac crest; the left upper acetabulum; the left femur intertrochanteric region; and the right femur intertrochanteric region. Index activity in the left upper  acetabulum with maximum SUV 11.4 and mild lateral cortical breakthrough. Index activity in the T12 vertebral body with maximum SUV 9.9 and lucency surrounding the right pedicle screw, and right lateral cortical destruction of the vertebral body potentially with some paravertebral extension of tumor. Index activity in the left medial clavicle with maximum SUV 11.6. The lytic lesion in the left intertrochanteric portion of the femur has maximum SUV of 9.0 and is significantly larger than the contralateral hip lesion. Incidental CT findings: There are some small speckled hypodensities in the calvarium but without accentuated metabolic activity. Other lucency such a lucency in the right side of the T1 vertebral body on image 69/4 likewise do not demonstrate hypermetabolic activity and may represent previously effectively treated lesions. The corpectomy site is noted some lucency along the right side of the corpectomy site which is not hypermetabolic. A lucent lesion in the left L4 pedicle is likewise not hypermetabolic. EXTREMITIES: Situated between the iloipsoas and the rectus femoris muscle  proximally along the anterior margin of the left hip, a 6.8 by 9.0 by 13.6 cm fatty mass is present without accentuated metabolic activity, compatible with lipoma. Likewise there is a lipoma along the margin of the right brachialis muscle just proximal to the elbow for example on image 128/4. There is diffuse subcutaneous edema in the right upper arm and right forearm, especially in the forearm, with low-grade associated nonfocal metabolic activity, significance uncertain. Incidental CT findings: none IMPRESSION: 1. Multifocal hypermetabolic lytic lesions in the skeleton as noted above, compatible with active myeloma. In the spine, the most notable active lesion of concern is at the T12 level with there is a large right eccentric vertebral body lesion with demineralization of the cortex and likely some paravertebral extension of  tumor. This could cause loosening of the right pedicle screw. Intraspinal extension of tumor is difficult to exclude. MRI might be considered although the posterolateral rod and pedicle screws may cause artifact. 2. There is likewise cortical breakthrough associated with the lytic lesion along the left upper acetabulum. 3. A lesion in the left intertrochanteric area of the femur is large enough to potentially cause biomechanical weakening which increases risk of fracture. 4. Other skeletal sites of active involvement are detailed above in the skeleton section. 5. Several lucent lesions are observed including the calvarium, T1 vertebral body, and left L4 pedicle which are not hypermetabolic and may represent effectively previously treated lesions. 6. 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 There is also a lipoma along the right brachialis muscle. 7. Nonspecific subcutaneous edema especially in the right forearm but also extending up into the right upper arm. Cause is uncertain. There is some low-grade nonfocal metabolic activity along this area. Strictly speaking, cellulitis is not excluded, correlate with clinical history and visual inspection. 8. Other imaging findings of potential clinical significance: Aortic Atherosclerosis (ICD10-I70.0). Mild cardiomegaly. Cholelithiasis. Prostatomegaly. Electronically Signed   By: Van Clines M.D.   On: 06/09/2018 15:14   Vas [place] Upper Extremity Venous Duplex  Result Date: 06/12/2018 UPPER VENOUS STUDY  Indications: Swelling, and Pain Performing Technologist: Abram Sander RVS  Examination Guidelines: A complete evaluation includes B-mode imaging, spectral Doppler, color Doppler, and power Doppler as needed of all accessible portions of each vessel. Bilateral testing is considered an integral part of a complete examination. Limited examinations for reoccurring indications may be performed as  noted.  Right Findings: +----------+------------+---------+-----------+----------+-------+  RIGHT      Compressible Phasicity Spontaneous Properties Summary  +----------+------------+---------+-----------+----------+-------+  IJV            Full        Yes        Yes                         +----------+------------+---------+-----------+----------+-------+  Subclavian     Full        Yes        Yes                         +----------+------------+---------+-----------+----------+-------+  Axillary       Full        Yes        Yes                         +----------+------------+---------+-----------+----------+-------+  Brachial  Full        Yes        Yes                         +----------+------------+---------+-----------+----------+-------+  Radial         Full                                               +----------+------------+---------+-----------+----------+-------+  Ulnar          Full                                               +----------+------------+---------+-----------+----------+-------+  Cephalic       Full                                               +----------+------------+---------+-----------+----------+-------+  Basilic        Full                                               +----------+------------+---------+-----------+----------+-------+  Left Findings: +----+------------+---------+-----------+----------+-------+  LEFT Compressible Phasicity Spontaneous Properties Summary  +----+------------+---------+-----------+----------+-------+  IJV                  Yes        Yes                         +----+------------+---------+-----------+----------+-------+  Summary:  Right: No evidence of deep vein thrombosis in the upper extremity. No evidence of superficial vein thrombosis in the upper extremity.  Left: No evidence of thrombosis in the subclavian.  *See table(s) above for measurements and observations.  Diagnosing physician: Harold Barban MD Electronically signed by Harold Barban  MD on 06/12/2018 at 2:19:58 PM.    Final     Labs:  CBC: Recent Labs    05/09/18 1324 05/23/18 0943 06/06/18 0836 06/17/18 0742  WBC 4.5 4.3 4.7 5.1  HGB 11.5* 12.4* 12.3* 12.7*  HCT 35.6* 37.4* 36.9* 39.4  PLT 223 224 204 243    COAGS: No results for input(s): INR, APTT in the last 8760 hours.  BMP: Recent Labs    05/09/18 1324 05/23/18 0943 06/06/18 0836 06/17/18 0742  NA 141 138 138 138  K 3.9 4.1 3.5 4.2  CL 106 102 106 101  CO2 _0 GLUCOSE 96 84 112* 104*  BUN _1 CALCIUM 9.4 9.9 9.3 10.1  CREATININE 0.71 0.77 0.76 0.80  GFRNONAA >60 >60 >60 >60  GFRAA >60 >60 >60 >60    LIVER FUNCTION TESTS: Recent Labs    05/09/18 1324 05/23/18 0943 06/06/18 0836 06/17/18 0742  BILITOT 0.7 0.7 0.5 0.4  AST 12* 15 15 39  ALT _2 58*  ALKPHOS 57 70 68 108  PROT 6.6 7.2 7.1 7.6  ALBUMIN 4.0 4.2 4.2 4.2    TUMOR MARKERS: No results for input(s): AFPTM,  CEA, CA199, CHROMGRNA in the last 8760 hours.  Assessment and Plan: 63 y.o. male with history of relapsing multiple myeloma and poor venous access who presents today for Port-A-Cath placement for additional chemotherapy.Risks and benefits of image guided port-a-catheter placement was discussed with the patient including, but not limited to bleeding, infection, pneumothorax, or fibrin sheath development and need for additional procedures.  All of the patient's questions were answered, patient is agreeable to proceed. Consent signed and in chart.   LABS PEND   Thank you for this interesting consult.  I greatly enjoyed meeting ZHAMIR PIRRO and look forward to participating in their care.  A copy of this report was sent to the requesting provider on this date.  Electronically Signed: D. Rowe Robert, PA-C 06/21/2018, 9:02 AM   I spent a total of  25 minutes   in face to face in clinical consultation, greater than 50% of which was counseling/coordinating care for Port-A-Cath placement

## 2018-06-22 NOTE — Telephone Encounter (Signed)
Oral Chemotherapy Pharmacist Encounter   I spoke with patient for overview of: Pomalyst (pomalidomide) for the treatment of multiple myeloma in relapse, with 17p deletion, in conjunction with Darzalex (daratumumab) and dexamethasone, planned duration until disease progression or unacceptable toxicity.   Counseled patient on administration, dosing, side effects, monitoring, drug-food interactions, safe handling, storage, and disposal.  Patient will take Pomalyst 67m capsules, 1 capsule by mouth once daily, without regard to food, with a full glass of water.  Pomalyst will be given 21 days on, 7 days off, repeat every 28 days.  Patient will take dexamethasone 424mtablets, 5 tablets (2061mby mouth once weekly with breakfast on the day after daratumumab infusion.  Daratumumab will be infused at 16 mg/kg once weekly x 8 doses, then once every 2 weeks x 8 doses, then once every 4 weeks until discontinuation.  Daratumumab start date: 06/17/18 Pomalyst start date: 06/23/18  Adverse effects of Pomalyst include but are not limited to: nausea, constipation, diarrhea, abdominal pain, rash, fatigue, drug fever, peripheral edema, and decreased blood counts.    Reviewed with patient importance of keeping a medication schedule and plan for any missed doses.  Medication reconciliation performed and medication/allergy list updated.  Patient has stopped acyclovir. He states he was directed by Dr. KalIrene Limbo do so. I instructed patient to resume acyclovir for VZV prophylaxis and will follow-up with MD.  I will return call to patient if he should NOT be on acyclovir.  Patient instructed to resume aspirin 39m2mce daily for thromboprophylaxis. He will pick this up today or tomorrow. He will wait until he can start on aspirin before starting on his Pomalyst.  Importance of acyclovir and aspirin reviewed with patient.  Insurance authorization for [employer] been obtained.  [facility] as it is a limited distribution medication and per his insurance requirement. Copayment for 1st fill is $0. Patient received his 1st fill of Pomalyst this morning.  Patient requests for a bed for daratumumab infusion scheduled for 06/24/18. I have forwarded this request to collaborative practice RN. We discussed dosing schedule for daratumumab and possible length of therapy for his current medication regimen.  All questions answered.  Mr. DunbRaseced understanding and appreciation.   Patient knows to call the office with questions or concerns.  JessJohny DrillingarmD, BCPS, BCOP  06/22/2018   12:41 PM Oral Oncology Clinic [phone]

## 2018-06-22 NOTE — Telephone Encounter (Signed)
Oral Oncology Patient Advocate Encounter  I confirmed with [facility] that the Pomlayst was delivered to the patient on 5/20 with a $0 copay.  Since the address in Epic is for [place], I called the patient to be sure he received it and he did receive it today. [facility] could not provide the address they sent it to and he has a [place] address on file so I wanted to be sure he did get it at the [name] address.   I informed him that the Pharmacist, Denyse Amass would be contacting him to go over the medicine with him before he starts taking it.  The patient verbalized understanding and great appreciation.  [name] Phone [phone] Fax [phone] 06/22/2018   10:14 AM

## 2018-06-23 ENCOUNTER — Other Ambulatory Visit (HOSPITAL_COMMUNITY): Payer: Self-pay | Admitting: Hematology

## 2018-06-23 ENCOUNTER — Inpatient Hospital Stay
Admission: RE | Admit: 2018-06-23 | Discharge: 2018-06-23 | Disposition: A | Discharge status: Home / Self Care | Payer: Self-pay | Source: Ambulatory Visit | Attending: Hematology | Admitting: Hematology

## 2018-06-23 DIAGNOSIS — C801 Malignant (primary) neoplasm, unspecified: Secondary | ICD-10-CM

## 2018-06-23 DIAGNOSIS — R2242 Localized swelling, mass and lump, left lower limb: Secondary | ICD-10-CM | POA: Insufficient documentation

## 2018-06-23 NOTE — Progress Notes (Signed)
HEMATOLOGY/ONCOLOGY CLINIC NOTE  Date of Service: 06/24/2018  Patient Care Team: Patient, No Pcp Per as PCP - General (General Practice)  CHIEF COMPLAINTS/PURPOSE OF CONSULTATION:  Continued management of Multiple Myeloma  Oncologic History:   Jesus [name] was diagnosed with multiple myeloma in July 2018 in [place]. He initially developed mid back pain in May 2018 and was seen to have a T10 vertebral body mass with additional lytic lesion at T9. His initial M spike in July 2018 was 1.5g with IgG Lambda specificity and IgG elevated at 2248m. His July 2018 BM Bx revealed 20% monoclonal plasma cells and FISH revealed a 17p deletion. He received 4 fractions of palliative RT, however his back pain worsened and developed bilateral lower extremity numbness and tingling, and was then seen to have a T10 pathologic fracture with cord compression s/p T10 corpectomy. The pt began VRd on 09/24/16, completed 7 cycles, [facility], post transplant complicated by neutropenic colitis and deconditioning. Repeat BM Bx on 04/21/17 revealed residual plasma cell with less than 5% lambda light chain restricted plasma cells, and a post-transplant M spike of 0.4g. The pt then began maintenance 530mm2 Carfilzomib every 2 weeks on 06/16/17.  HISTORY OF PRESENTING ILLNESS:   Jesus MARKUSONs a wonderful [age].o. male who has been referred to [place] Dr. BePhil Doppor evaluation and management of Multiple Myeloma. The pt reports that he is doing well overall.  The [place], the pt has moved back to his home which is here locally, and is transferring his care here now as well. The pt notes that he is anticipating C11D1 maintenance Carfilzomib today. The pt notes that his most recent M  spike was 0.2g. His last BM biopsy was March 2019, as noted above. He denies any infection issues in the last year. The pt notes that he has received all of is post-transplant vaccinations. [facility]  The pt reports that he continues to have back pain. He has been receiving Zometa every 4 weeks, but was recently transitioned to every 3 months, he denies dental concerns at this time. He is taking Calcium and Vitamin D replacement.   The pt has been taking Marinol for appetite stimulation.   The pt notes that he has an enlarged prostate, which was seen on imaging. He notes that he was expecting to receive a PSA test soon.  The pt notes that prior to his Multiple Myeloma diagnosis in July 2018, he had a "clean bill of health." He denies DM, HTN, lung problems, heart problems, thyroid problems or other medical concerns.  Most recent lab results (03/23/18) of CBC is as follows: all values are WNL except for RBC at 4.03, HGB at 11.5, HCT at 35.6%, MPV at 8.9, Glucose at 135, Total Protein at 6.2.  On review of systems, pt reports chronic back pain, stable energy levels, and denies concerns for infections, new pain along the spine, abdominal pains, leg swelling, and any other symptoms.   On PMHx the pt reports Multiple Myeloma, denies HTN, HLD, lung problems, strokes, seizures, heart problems and thyroid problems. On Social Hx the pt reports working for the [employer] Interval History:  Jesus [name] returns today for management and evaluation of his multiple myeloma not in remission, and his maintenance Carfilzomib treatment. The patient's last visit with [place] was on 06/10/18. The pt reports that he is doing well overall.  The pt reports that he has not been able to pick up his morphine and is taking one 88m Percocet every 4-6 hours, noting that this is not controlling his pain. He notes that his insurance did not approve Oxycontin nor  MS contin.   The pt was able [place] to have prophylactic fixations to prevent fracture at this time, and has been recommended to pursue RT. The pt notes that Dr. EMylo Redalso recommended seeing a neurosurgeon for his spine tumors.  The pt notes that he was able to tolerate C1D1 Daratumumab and Dexamethasone last week and denies any concerns receiving this. He also began 278mPomalyst yesterday and denies problems taking this as well.  Lab results today (06/24/18) of CBC w/diff and CMP is as follows: all values are WNL except for RBC at 3.98, HGB at 11.7, HCT at 35.8, Glucose at 108.  On review of systems, pt reports back pain, stable energy levels, eating well, hip pain, and denies abdominal pain, leg swelling, concerns for infections, and any other symptoms.    MEDICAL HISTORY:  Past Medical History:  Diagnosis [facility]   [facility]    SURGICAL HISTORY: Past Surgical History:  Procedure Laterality Date   BACK SURGERY     IR IMAGING GUIDED PORT INSERTION  06/21/2018    SOCIAL HISTORY: Social History   Socioeconomic History   Marital status: Married    Spouse name: Not on file   Number of children: Not on file   Years of education: Not on file   Highest education level: Not on file  Occupational History   Not on file  Social Needs   Financial resource strain: Not on file   Food insecurity:    Worry: Not on file    Inability: Not on file   Transportation needs:    Medical: Not on file    Non-medical: Not on file  Tobacco Use   Smoking status: Never Smoker   Smokeless tobacco: Never Used  Substance and Sexual Activity   Alcohol use: Never    Frequency: Never   Drug use: Never   Sexual activity: Not on file  Lifestyle   Physical activity:    Days per week: Not on file    Minutes per session: Not on file   Stress: Not on file  Relationships   Social connections:    Talks on phone: Not on  file    Gets together: Not on file    Attends religious service: Not on file    Active member of club or organization: Not on file    Attends meetings of clubs or organizations: Not on file    Relationship status: Not on file   Intimate partner violence:    Fear of current or ex partner: Not on file    Emotionally abused: Not on file    Physically abused: Not on file    Forced sexual activity: Not on file  Other Topics Concern   Not on file  Social History Narrative   Not on file    FAMILY HISTORY: No family history on file.  ALLERGIES:  is allergic to penicillins.  MEDICATIONS:  Current Outpatient Medications  Medication Sig Dispense  Refill   acyclovir (ZOVIRAX) 400 MG tablet Take 1 tablet (400 mg total) by mouth 2 (two) times daily. 60 tablet 2   acyclovir (ZOVIRAX) 400 MG tablet Take 1 tablet (400 mg total) by mouth 2 (two) times daily. 60 tablet 11   Calcium Carb-Cholecalciferol (CALCIUM 1000 + D) 1000-800 MG-UNIT TABS Take 1,000 mg by mouth once.     dexamethasone (DECADRON) 4 MG tablet Take 5 tablets (3m) with breakfast the day after each dose of Daratumumab 120 tablet 1   gabapentin (NEURONTIN) 300 MG capsule Take 1 capsule (300 mg total) by mouth 3 (three) times daily. 90 capsule 2   lidocaine-prilocaine (EMLA) cream Apply to affected area once 30 g 3   morphine (MS CONTIN) 15 MG 12 hr tablet Take 1 tablet (15 mg total) by mouth every 12 (twelve) hours. 60 tablet 0   ondansetron (ZOFRAN) 4 MG tablet Take 1 tablet (4 mg total) by mouth every 8 (eight) hours as needed for nausea or vomiting. 30 tablet 0   ondansetron (ZOFRAN) 8 MG tablet Take 1 tablet (8 mg total) by mouth 2 (two) times daily as needed (Nausea or vomiting). 30 tablet 1   oxyCODONE (OXY IR/ROXICODONE) 5 MG immediate release tablet Take 1 tablet (5 mg total) by mouth every 4 (four) hours as needed for severe pain. 120 tablet 0   pomalidomide (POMALYST) 2 MG capsule Take 1 capsule (2 mg total) by  mouth daily. Take with water on days 1-21. Repeat every 28 days. [employer] auth# [id number] capsule 0   prochlorperazine (COMPAZINE) 10 MG tablet Take 1 tablet (10 mg total) by mouth every 6 (six) hours as needed (Nausea or vomiting). 30 tablet 1   No current facility-administered medications for this visit.     REVIEW OF SYSTEMS:    A 10+ POINT REVIEW OF SYSTEMS WAS OBTAINED including neurology, dermatology, psychiatry, cardiac, respiratory, lymph, extremities, GI, GU, Musculoskeletal, constitutional, breasts, reproductive, HEENT.  All pertinent positives are noted in the HPI.  All others are negative.   PHYSICAL EXAMINATION: ECOG PERFORMANCE STATUS: 1 - Symptomatic but completely ambulatory  Vitals:   06/24/18 0909  BP: 118/75  Pulse: 92  Resp: 18  Temp: 98.3 F (36.8 C)  SpO2: 100%   Filed Weights   06/24/18 0909  Weight: 123 lb (55.8 kg)   .Body mass index is 18.98 kg/m.  GENERAL:alert, in no acute distress and comfortable SKIN: no acute rashes, no significant lesions EYES: conjunctiva are pink and non-injected, sclera anicteric OROPHARYNX: MMM, no exudates, no oropharyngeal erythema or ulceration NECK: supple, no JVD LYMPH:  no palpable lymphadenopathy in the cervical, axillary or inguinal regions LUNGS: clear to auscultation b/l with normal respiratory effort HEART: regular rate & rhythm ABDOMEN:  normoactive bowel sounds , non tender, not distended. No palpable hepatosplenomegaly.  Extremity: no pedal edema PSYCH: alert & oriented x 3 with fluent speech NEURO: no focal motor/sensory deficits   LABORATORY DATA:  I have reviewed the data as listed  . CBC Latest Ref Rng & Units 06/24/2018 06/17/2018 06/06/2018  WBC 4.0 - 10.5 K/uL 5.2 5.1 4.7  Hemoglobin 13.0 - 17.0 g/dL 11.7(L) 12.7(L) 12.3(L)  Hematocrit 39.0 - 52.0 % 35.8(L) 39.4 36.9(L)  Platelets 150 - 400 K/uL 223 243 204    . CMP Latest Ref Rng & Units 06/24/2018 06/17/2018 06/06/2018  Glucose 70 - 99 mg/dL  108(H) 104(H) 112(H)  BUN 8 - 23 mg/dL '12 9 8  ' Creatinine 0.61 - 1.24 mg/dL 0.73 0.80  0.76  Sodium 135 - 145 mmol/L 137 138 138  Potassium 3.5 - 5.1 mmol/L 4.5 4.2 3.5  Chloride 98 - 111 mmol/L 102 101 106  CO2 22 - 32 mmol/L '27 27 22  ' Calcium 8.9 - 10.3 mg/dL 9.8 10.1 9.3  Total Protein 6.5 - 8.1 g/dL 7.0 7.6 7.1  Total Bilirubin 0.3 - 1.2 mg/dL 0.5 0.4 0.5  Alkaline Phos 38 - 126 U/L 83 108 68  AST 15 - 41 U/L 15 39 15  ALT 0 - 44 U/L 27 58(H) 19     RADIOGRAPHIC STUDIES: I have personally reviewed the radiological images as listed and agreed with the findings in the report. Nm Pet Image Restage (ps) Whole Body  Result Date: 06/09/2018 CLINICAL DATA:  Subsequent treatment strategy for multiple myeloma. Back pain, prior T10 corpectomy. Prior care in [place]. EXAM: NUCLEAR MEDICINE PET WHOLE BODY TECHNIQUE: 6.1 mCi F-18 FDG was injected intravenously. Full-ring PET imaging was performed from the skull base to thigh after the radiotracer. CT data was obtained and used for attenuation correction and anatomic localization. Fasting blood glucose: 99 mg/dl COMPARISON:  None. FINDINGS: Mediastinal blood pool activity: SUV max 2.3 HEAD/NECK: No significant abnormal hypermetabolic activity in this region. Incidental CT findings: none CHEST: No significant abnormal hypermetabolic activity in this region. Incidental CT findings: Mild atherosclerotic calcification of the aortic arch. Mild cardiomegaly. Minimal biapical pleuroparenchymal scarring. Fatty lesion along the right posterior pleural margin, likely representing a small hernia of adipose tissue, less likely a pleural lipoma. ABDOMEN/PELVIS: No significant abnormal hypermetabolic activity in this region. Incidental CT findings: 4 mm gallstone subtle dependently in the gallbladder. Prostatomegaly. SKELETON: Primarily lytic hypermetabolic lesions are present scattered in the skeleton. Visualize lesions include the right C3 vertebral body; the right  proximal humeral head and shaft; the right posterior glenoid; the left T2 posterior elements; the left medial clavicle; the right fifth rib laterally; the T12 vertebral body; the right iliac crest; the left upper acetabulum; the left femur intertrochanteric region; and the right femur intertrochanteric region. Index activity in the left upper acetabulum with maximum SUV 11.4 and mild lateral cortical breakthrough. Index activity in the T12 vertebral body with maximum SUV 9.9 and lucency surrounding the right pedicle screw, and right lateral cortical destruction of the vertebral body potentially with some paravertebral extension of tumor. Index activity in the left medial clavicle with maximum SUV 11.6. The lytic lesion in the left intertrochanteric portion of the femur has maximum SUV of 9.0 and is significantly larger than the contralateral hip lesion. Incidental CT findings: There are some small speckled hypodensities in the calvarium but without accentuated metabolic activity. Other lucency such a lucency in the right side of the T1 vertebral body on image 69/4 likewise do not demonstrate hypermetabolic activity and may represent previously effectively treated lesions. The corpectomy site is noted some lucency along the right side of the corpectomy site which is not hypermetabolic. A lucent lesion in the left L4 pedicle is likewise not hypermetabolic. EXTREMITIES: Situated between the iloipsoas and the rectus femoris muscle proximally along the anterior margin of the left hip, a 6.8 by 9.0 by 13.6 cm fatty mass is present without accentuated metabolic activity, compatible with lipoma. Likewise there is a lipoma along the margin of the right brachialis muscle just proximal to the elbow for example on image 128/4. There is diffuse subcutaneous edema in the right upper arm and right forearm, especially in the forearm, with low-grade associated nonfocal metabolic activity, significance uncertain. Incidental  CT  findings: none IMPRESSION: 1. Multifocal hypermetabolic lytic lesions in the skeleton as noted above, compatible with active myeloma. In the spine, the most notable active lesion of concern is at the T12 level with there is a large right eccentric vertebral body lesion with demineralization of the cortex and likely some paravertebral extension of tumor. This could cause loosening of the right pedicle screw. Intraspinal extension of tumor is difficult to exclude. MRI might be considered although the posterolateral rod and pedicle screws may cause artifact. 2. There is likewise cortical breakthrough associated with the lytic lesion along the left upper acetabulum. 3. A lesion in the left intertrochanteric area of the femur is large enough to potentially cause biomechanical weakening which increases risk of fracture. 4. Other skeletal sites of active involvement are detailed above in the skeleton section. 5. Several lucent lesions are observed including the calvarium, T1 vertebral body, and left L4 pedicle which are not hypermetabolic and may represent effectively previously treated lesions. 6. 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 There is also a lipoma along the right brachialis muscle. 7. Nonspecific subcutaneous edema especially in the right forearm but also extending up into the right upper arm. Cause is uncertain. There is some low-grade nonfocal metabolic activity along this area. Strictly speaking, cellulitis is not excluded, correlate with clinical history and visual inspection. 8. Other imaging findings of potential clinical significance: Aortic Atherosclerosis (ICD10-I70.0). Mild cardiomegaly. Cholelithiasis. Prostatomegaly. Electronically Signed   By: Van Clines M.D.   On: 06/09/2018 15:14   Ir Imaging Guided Port Insertion  Result Date: 06/21/2018 CLINICAL DATA:  Port a cath placement for chemotherapy for relapsed myeloma  EXAM: TUNNELED PORT CATHETER PLACEMENT WITH ULTRASOUND AND FLUOROSCOPIC GUIDANCE FLUOROSCOPY TIME:  0.2 minutes; 18  uGym2 DAP ANESTHESIA/SEDATION: Intravenous Fentanyl 130mg and Versed 428mwere administered as conscious sedation during continuous monitoring of the patient's level of consciousness and physiological / cardiorespiratory status by the radiology RN, with a total moderate sedation time of 30 minutes. TECHNIQUE: The procedure, risks, benefits, and alternatives were explained to the patient. Questions regarding the procedure were encouraged and answered. The patient understands and consents to the procedure. As antibiotic prophylaxis, vancomycin 1 g was ordered pre-procedure and administered intravenously within one hour of incision. Patency of the right IJ vein was confirmed with ultrasound with image documentation. An appropriate skin site was determined. Skin site was marked. Region was prepped using maximum barrier technique including cap and mask, sterile gown, sterile gloves, large sterile sheet, and Chlorhexidine as cutaneous antisepsis. The region was infiltrated locally with 1% lidocaine. Under real-time ultrasound guidance, the right IJ vein was accessed with a 21 gauge micropuncture needle; the needle tip within the vein was confirmed with ultrasound image documentation. Needle was exchanged over a 018 guidewire for transitional dilator which allowed passage of the [facility] Center[facility] into the IVC. Over this, the transitional dilator was exchanged for a 5 Fr[place] catheter. A small incision was made on the right anterior chest wall and a subcutaneous pocket fashioned. The power-injectable port was positioned and its catheter tunneled to the right IJ dermatotomy site. The MPA catheter was exchanged over an Amplatz wire for a peel-away sheath, through which the port catheter, which had been trimmed to the appropriate length, was advanced and positioned under fluoroscopy with its tip at the cavoatrial  junction. Spot chest radiograph confirms good catheter position and no pneumothorax. The pocket was closed  with deep interrupted and subcuticular continuous 3-0 Monocryl sutures. The port was flushed per protocol. The incisions were covered with Dermabond then covered with a sterile dressing. COMPLICATIONS: COMPLICATIONS None immediate IMPRESSION: Technically successful right IJ power-injectable port catheter placement. Ready for routine use. Electronically Signed   By: Lucrezia Europe M.D.   On: 06/21/2018 13:17   Vas [place] Upper Extremity Venous Duplex  Result Date: 06/12/2018 UPPER VENOUS STUDY  Indications: Swelling, and Pain Performing Technologist: Abram Sander RVS  Examination Guidelines: A complete evaluation includes B-mode imaging, spectral Doppler, color Doppler, and power Doppler as needed of all accessible portions of each vessel. Bilateral testing is considered an integral part of a complete examination. Limited examinations for reoccurring indications may be performed as noted.  Right Findings: +----------+------------+---------+-----------+----------+-------+  RIGHT      Compressible Phasicity Spontaneous Properties Summary  +----------+------------+---------+-----------+----------+-------+  IJV            Full        Yes        Yes                         +----------+------------+---------+-----------+----------+-------+  Subclavian     Full        Yes        Yes                         +----------+------------+---------+-----------+----------+-------+  Axillary       Full        Yes        Yes                         +----------+------------+---------+-----------+----------+-------+  Brachial       Full        Yes        Yes                         +----------+------------+---------+-----------+----------+-------+  Radial         Full                                               +----------+------------+---------+-----------+----------+-------+  Ulnar          Full                                                +----------+------------+---------+-----------+----------+-------+  Cephalic       Full                                               +----------+------------+---------+-----------+----------+-------+  Basilic        Full                                               +----------+------------+---------+-----------+----------+-------+  Left Findings: +----+------------+---------+-----------+----------+-------+  LEFT Compressible Phasicity Spontaneous Properties Summary  +----+------------+---------+-----------+----------+-------+  IJV  Yes        Yes                         +----+------------+---------+-----------+----------+-------+  Summary:  Right: No evidence of deep vein thrombosis in the upper extremity. No evidence of superficial vein thrombosis in the upper extremity.  Left: No evidence of thrombosis in the subclavian.  *See table(s) above for measurements and observations.  Diagnosing physician: Harold Barban MD Electronically signed by Harold Barban MD on 06/12/2018 at 2:19:58 PM.    Final     ASSESSMENT & PLAN:  63 y.o. male with  1. 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 IgG at 2220. 08/11/16 PET/CT revealed Hypermetabolic large soft tissue mass in the lower thoracic paraspinal region associated with lytic destruction of T10 vertebral body, concerning for multiple myeloma. Additional smaller lytic lesions involving the skeleton. S/p RT x 4 fractions, discontinued due to T10 pathologic fracture with severe cord compression S/p 08/26/16 T10 corpectomy and T8-L1 posterior spinal fusion   Began 7 cycles of VRd on 09/24/16 S/p autologous stem cell transplant, Day 0 on 02/23/17 04/21/17 BM Bx with residual less than 5% lambda light chain restricted plasma cells in the bone marrow. M Spike  at 0.4g Began maintenance 83m/m2 Carfilzomib every 2 weeks on 06/16/17  06/09/18 PET/CT revealed "Multifocal hypermetabolic lytic lesions in the skeleton as noted above, compatible with active myeloma. In the spine, the most notable active lesion of concern is at the T12 level with there is a large right eccentric vertebral body lesion with demineralization of the cortex and likely some paravertebral extension of tumor. This could cause loosening of the right pedicle screw. Intraspinal extension of tumor is difficult to exclude. MRI might be considered although the posterolateral rod and pedicle screws may cause artifact. 2. There is likewise cortical breakthrough associated with the lytic lesion along the left upper acetabulum. 3. A lesion in the left intertrochanteric area of the femur is large enough to potentially cause biomechanical weakening which increases risk of fracture. 4. Other skeletal sites of active involvement are detailed above in the skeleton section. 5. Several lucent lesions are observed including the calvarium, T1 vertebral body, and left L4 pedicle which are not hypermetabolic and may represent effectively previously treated lesions. 6. 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 There is also a lipoma along the right brachialis muscle. 7. Nonspecific subcutaneous edema especially in the right forearm but also extending up into the right upper arm. Cause is uncertain. There is some low-grade nonfocal metabolic activity along this area. Strictly speaking, cellulitis is not excluded, correlate with clinical history and visual inspection. 8. Other imaging findings of potential clinical significance: Aortic Atherosclerosis. Mild cardiomegaly. Cholelithiasis. Prostatomegaly."  PLAN: -Discussed pt labwork today, 06/24/18; blood counts and chemistries are stable -Last available MMP from 06/17/18 revealed M Protein at 0.5g, just  before beginning C1 Daratumumab -The pt has no prohibitive toxicities from continuing C1D8 Daratumumab and Dexamethasone at this time. -The pt has no prohibitive toxicities from continuing C1D2 of 294mPomalyst at this time, three weeks on and one week off. -Increase to 1-2 tablets of 42m76mxycodone  - MS Contin in setting of cancer related pain - [employer]  requesting preauth -- in process -Will again order BM Bx for new baseline and complete characterization -Will refer the pt to Rad Onc for RT to spine tumor, hip, and other involved sites -Will refer the pt to neurosurgery for evaluation of his spinal myeloma lesion to determine appropriate course for spine stabilization -Previously discussed that the PET/CT reveals disease that is disproportionate to the patient's blood tests which have most recently revealed an M spike of 0.4g on 06/06/18, and he initially had an M spike of 1.5g on initial diagnosis in July 2018, with 20% bone marrow involvement. -Discussed that I am also recommending a referral for a second opinion at [place] to on-board transplant team in his care -Stressed that pt is to avoid bending, pushing, pulling, lifting, and climbing stairs as much as possible to avoid potential fractures  -Antibiotic course as well - doxycycline -Zometa every 12 weeks -Advised that pt establish care with a PCP locally -Will check labs weekly -Will see the pt back in 3 weeks   -Radiation oncology urgent appointment for myeloma - palliative radiation -Spine surgery urgent referral for myeloma recurrence in spine with hardware in situ (?possible loosening). -CT bone marrow aspiration and biopsy -Plz schedule C1 and C2 of Daratumumab -RTC with Dr Irene Limbo in 3 weeks -weekly labs   All of the patients questions were answered with apparent satisfaction. The patient knows to call the clinic with any problems, questions or concerns.  The total time spent in the appt was 30 minutes and more than 50%  was on counseling and direct patient cares.    Jesus Lone MD MS AAHIVMS [facility] Hematology/Oncology Physician [facility]  (Office):       [phone] (Work cell):  [phone] (Fax):           [phone]  06/24/2018 10:15 AM  I, Baldwin Jamaica, am acting as a scribe for Dr. Sullivan [name].   .I have reviewed the above documentation for accuracy and completeness, and I agree with the above. Brunetta Genera MD

## 2018-06-24 ENCOUNTER — Inpatient Hospital Stay (HOSPITAL_BASED_OUTPATIENT_CLINIC_OR_DEPARTMENT_OTHER): Payer: 59 | Admitting: Hematology

## 2018-06-24 ENCOUNTER — Inpatient Hospital Stay: Payer: 59

## 2018-06-24 ENCOUNTER — Other Ambulatory Visit: Payer: Self-pay

## 2018-06-24 VITALS — BP 122/77 | HR 90 | Temp 97.8°F | Resp 16

## 2018-06-24 VITALS — BP 118/75 | HR 92 | Temp 98.3°F | Resp 18 | Ht 67.5 in | Wt 123.0 lb

## 2018-06-24 DIAGNOSIS — C9000 Multiple myeloma not having achieved remission: Secondary | ICD-10-CM

## 2018-06-24 DIAGNOSIS — Z7189 Other specified counseling: Secondary | ICD-10-CM

## 2018-06-24 DIAGNOSIS — Z79899 Other long term (current) drug therapy: Secondary | ICD-10-CM | POA: Diagnosis not present

## 2018-06-24 LAB — CMP (CANCER CENTER ONLY)
ALT: 27 U/L (ref 0–44)
AST: 15 U/L (ref 15–41)
Albumin: 3.9 g/dL (ref 3.5–5.0)
Alkaline Phosphatase: 83 U/L (ref 38–126)
Anion gap: 8 (ref 5–15)
BUN: 12 mg/dL (ref 8–23)
CO2: 27 mmol/L (ref 22–32)
Calcium: 9.8 mg/dL (ref 8.9–10.3)
Chloride: 102 mmol/L (ref 98–111)
Creatinine: 0.73 mg/dL (ref 0.61–1.24)
GFR, Est AFR Am: >60 mL/min (ref >60)
GFR, Estimated: >60 mL/min (ref >60)
Glucose, Bld: 108 mg/dL — ABNORMAL HIGH (ref 70–99)
Potassium: 4.5 mmol/L (ref 3.5–5.1)
Sodium: 137 mmol/L (ref 135–145)
Total Bilirubin: 0.5 mg/dL (ref 0.3–1.2)
Total Protein: 7 g/dL (ref 6.5–8.1)

## 2018-06-24 LAB — CBC WITH DIFFERENTIAL (CANCER CENTER ONLY)
Abs Immature Granulocytes: 0.01 10*3/uL (ref 0.00–0.07)
Basophils Absolute: 0 10*3/uL (ref 0.0–0.1)
Basophils Relative: 0 %
Eosinophils Absolute: 0.1 10*3/uL (ref 0.0–0.5)
Eosinophils Relative: 3 %
HCT: 35.8 % — ABNORMAL LOW (ref 39.0–52.0)
Hemoglobin: 11.7 g/dL — ABNORMAL LOW (ref 13.0–17.0)
Immature Granulocytes: 0 %
Lymphocytes Relative: 14 %
Lymphs Abs: 0.7 10*3/uL (ref 0.7–4.0)
MCH: 29.4 pg (ref 26.0–34.0)
MCHC: 32.7 g/dL (ref 30.0–36.0)
MCV: 89.9 fL (ref 80.0–100.0)
Monocytes Absolute: 0.5 10*3/uL (ref 0.1–1.0)
Monocytes Relative: 10 %
Neutro Abs: 3.8 10*3/uL (ref 1.7–7.7)
Neutrophils Relative %: 73 %
Platelet Count: 223 10*3/uL (ref 150–400)
RBC: 3.98 MIL/uL — ABNORMAL LOW (ref 4.22–5.81)
RDW: 14.6 % (ref 11.5–15.5)
WBC Count: 5.2 10*3/uL (ref 4.0–10.5)
nRBC: 0 % (ref 0.0–0.2)

## 2018-06-24 MED ORDER — ACETAMINOPHEN 325 MG PO TABS
650.0000 mg | ORAL_TABLET | Freq: Once | ORAL | Status: AC
  Administered 2018-06-24: 11:00:00 650 mg via ORAL

## 2018-06-24 MED ORDER — DEXAMETHASONE SODIUM PHOSPHATE 10 MG/ML IJ SOLN
INTRAMUSCULAR | Status: AC
  Filled 2018-06-24: qty 1

## 2018-06-24 MED ORDER — DIPHENHYDRAMINE HCL 25 MG PO CAPS
50.0000 mg | ORAL_CAPSULE | Freq: Once | ORAL | Status: AC
  Administered 2018-06-24: 11:00:00 50 mg via ORAL

## 2018-06-24 MED ORDER — FAMOTIDINE IN NACL 20-0.9 MG/50ML-% IV SOLN
20.0000 mg | Freq: Once | INTRAVENOUS | Status: AC
  Administered 2018-06-24: 11:00:00 20 mg via INTRAVENOUS

## 2018-06-24 MED ORDER — SODIUM CHLORIDE 0.9% FLUSH
10.0000 mL | INTRAVENOUS | Status: DC | PRN
  Administered 2018-06-24: 17:00:00 10 mL
  Filled 2018-06-24: qty 10

## 2018-06-24 MED ORDER — HEPARIN SOD (PORK) LOCK FLUSH 100 UNIT/ML IV SOLN
500.0000 [IU] | Freq: Once | INTRAVENOUS | Status: AC | PRN
  Administered 2018-06-24: 17:00:00 500 [IU]
  Filled 2018-06-24: qty 5

## 2018-06-24 MED ORDER — SODIUM CHLORIDE 0.9 % IV SOLN
20.0000 mg | Freq: Once | INTRAVENOUS | Status: AC
  Administered 2018-06-24: 11:00:00 20 mg via INTRAVENOUS
  Filled 2018-06-24: qty 20

## 2018-06-24 MED ORDER — SODIUM CHLORIDE 0.9 % IV SOLN
Freq: Once | INTRAVENOUS | Status: AC
  Administered 2018-06-24: 11:00:00 via INTRAVENOUS
  Filled 2018-06-24: qty 250

## 2018-06-24 MED ORDER — SODIUM CHLORIDE 0.9 % IV SOLN
15.4000 mg/kg | Freq: Once | INTRAVENOUS | Status: AC
  Administered 2018-06-24: 12:00:00 900 mg via INTRAVENOUS
  Filled 2018-06-24: qty 40

## 2018-06-24 MED ORDER — SODIUM CHLORIDE 0.9% FLUSH
10.0000 mL | Freq: Once | INTRAVENOUS | Status: AC | PRN
  Administered 2018-06-24: 10 mL
  Filled 2018-06-24: qty 10

## 2018-06-24 MED ORDER — FAMOTIDINE IN NACL 20-0.9 MG/50ML-% IV SOLN
INTRAVENOUS | Status: AC
  Filled 2018-06-24: qty 50

## 2018-06-24 MED ORDER — DIPHENHYDRAMINE HCL 25 MG PO CAPS
ORAL_CAPSULE | ORAL | Status: AC
  Filled 2018-06-24: qty 2

## 2018-06-24 MED ORDER — ACETAMINOPHEN 325 MG PO TABS
ORAL_TABLET | ORAL | Status: AC
  Filled 2018-06-24: qty 2

## 2018-06-24 NOTE — Patient Instructions (Signed)
[facility] Discharge Instructions for Patients Receiving Chemotherapy  Today you received the following chemotherapy agents Darzalex  To help prevent nausea and vomiting after your treatment, we encourage you to take your nausea medication as prescribed    If you develop nausea and vomiting that is not controlled by your nausea medication, call the clinic.   BELOW ARE SYMPTOMS THAT SHOULD BE REPORTED IMMEDIATELY:  *FEVER GREATER THAN 100.5 F  *CHILLS WITH OR WITHOUT FEVER  NAUSEA AND VOMITING THAT IS NOT CONTROLLED WITH YOUR NAUSEA MEDICATION  *UNUSUAL SHORTNESS OF BREATH  *UNUSUAL BRUISING OR BLEEDING  TENDERNESS IN MOUTH AND THROAT WITH OR WITHOUT PRESENCE OF ULCERS  *URINARY PROBLEMS  *BOWEL PROBLEMS  UNUSUAL RASH Items with * indicate a potential emergency and should be followed up as soon as possible.  Feel free to call the clinic should you have any questions or concerns. The clinic phone number is [phone].  Please show the CHEMO ALERT CARD at check-in to the Emergency Department and triage nurse.  Daratumumab injection What is this medicine? DARATUMUMAB (dar a toom ue mab) is a monoclonal antibody. It is used to treat multiple myeloma. This medicine may be used for other purposes; ask your health care provider or pharmacist if you have questions. COMMON BRAND NAME(S): DARZALEX What should I tell my health care provider before I take this medicine? They need to know if you have any of these conditions: -infection (especially a virus infection such as chickenpox, herpes, or hepatitis B virus) -lung or breathing disease -an unusual or allergic reaction to daratumumab, other medicines, foods, dyes, or preservatives -pregnant or trying to get pregnant -breast-feeding How should I use this medicine? This medicine is for infusion into a vein. It is given by a health care professional in a hospital or clinic setting. Talk to your pediatrician  regarding the use of this medicine in children. Special care may be needed. Overdosage: If you think you have taken too much of this medicine contact a poison control center or emergency room at once. NOTE: This medicine is only for you. Do not share this medicine with others. What if I miss a dose? Keep appointments for follow-up doses as directed. It is important not to miss your dose. Call your doctor or health care professional if you are unable to keep an appointment. What may interact with this medicine? Interactions have not been studied. Give your health care provider a list of all the medicines, herbs, non-prescription drugs, or dietary supplements you use. Also tell them if you smoke, drink alcohol, or use illegal drugs. Some items may interact with your medicine. This list may not describe all possible interactions. Give your health care provider a list of all the medicines, herbs, non-prescription drugs, or dietary supplements you use. Also tell them if you smoke, drink alcohol, or use illegal drugs. Some items may interact with your medicine. What should I watch for while using this medicine? This drug may make you feel generally unwell. Report any side effects. Continue your course of treatment even though you feel ill unless your doctor tells you to stop. This medicine can cause serious allergic reactions. To reduce your risk you may need to take medicine before treatment with this medicine. Take your medicine as directed. This medicine can affect the results of blood tests to match your blood type. These changes can last for up to 6 months after the final dose. Your healthcare provider will do blood tests to match your   blood type before you start treatment. Tell all of your healthcare providers that you are being treated with this medicine before receiving a blood transfusion. This medicine can affect the results of some tests used to determine treatment response; extra tests may be  needed to evaluate response. Do not become pregnant while taking this medicine or for 3 months after stopping it. Women should inform their doctor if they wish to become pregnant or think they might be pregnant. There is a potential for serious side effects to an unborn child. Talk to your health care professional or pharmacist for more information. What side effects may I notice from receiving this medicine? Side effects that you should report to your doctor or health care professional as soon as possible: -allergic reactions like skin rash, itching or hives, swelling of the face, lips, or tongue -breathing problems -chills -cough -dizziness -feeling faint or lightheaded -headache -low blood counts - this medicine may decrease the number of white blood cells, red blood cells and platelets. You may be at increased risk for infections and bleeding. -nausea, vomiting -shortness of breath -signs of decreased platelets or bleeding - bruising, pinpoint red spots on the skin, black, tarry stools, blood in the urine -signs of decreased red blood cells - unusually weak or tired, feeling faint or lightheaded, falls -signs of infection - fever or chills, cough, sore throat, pain or difficulty passing urine -signs and symptoms of liver injury like dark yellow or brown urine; general ill feeling or flu-like symptoms; light-colored stools; loss of appetite; right upper belly pain; unusually weak or tired; yellowing of the eyes or skin Side effects that usually do not require medical attention (report to your doctor or health care professional if they continue or are bothersome): -back pain -constipation -loss of appetite -diarrhea -joint pain -muscle cramps -pain, tingling, numbness in the hands or feet -swelling of the ankles, feet, hands -tiredness -trouble sleeping This list may not describe all possible side effects. Call your doctor for medical advice about side effects. You may report side  effects to FDA at 1-800-FDA-1088. Where should I keep my medicine? Keep out of the reach of children. This drug is given in a hospital or clinic and will not be stored at home. NOTE: This sheet is a summary. It may not cover all possible information. If you have questions about this medicine, talk to your doctor, pharmacist, or health care provider.  2019 Elsevier/Gold Standard (2017-08-20 15:52:44)

## 2018-06-28 ENCOUNTER — Telehealth: Payer: Self-pay | Admitting: *Deleted

## 2018-06-28 ENCOUNTER — Telehealth: Payer: Self-pay | Admitting: Hematology

## 2018-06-28 NOTE — Telephone Encounter (Signed)
Scheduled appt per 5/22 los.  Added treatment to the book for approval (5/29).  Will contact patient once appt has been added.

## 2018-06-28 NOTE — Telephone Encounter (Signed)
[facility] request for MS Contin 15 mg received - contacted pharmacy. Pharmacy states patient picked up on 5/24.

## 2018-06-29 ENCOUNTER — Other Ambulatory Visit: Payer: Self-pay | Admitting: Radiology

## 2018-06-29 ENCOUNTER — Other Ambulatory Visit: Payer: Self-pay

## 2018-06-29 ENCOUNTER — Ambulatory Visit
Admission: RE | Admit: 2018-06-29 | Discharge: 2018-06-29 | Disposition: A | Discharge status: Home / Self Care | Payer: 59 | Source: Ambulatory Visit | Attending: Radiation Oncology | Admitting: Radiation Oncology

## 2018-06-29 ENCOUNTER — Encounter: Payer: Self-pay | Admitting: Radiation Oncology

## 2018-06-29 VITALS — Ht 67.5 in | Wt 123.0 lb

## 2018-06-29 DIAGNOSIS — C9000 Multiple myeloma not having achieved remission: Secondary | ICD-10-CM

## 2018-06-29 NOTE — Progress Notes (Signed)
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  Bone Marrow Biopsy 06/30/2018  PET 06/09/2018: Primarily lytic hypermetabolic lesions are present scattered in the skeleton.  Visualize lesions include the right C3 vertebral body, the right proximal humeral head and shaft, right posterior glenoid, left T2 posterior elements, left medial clavicle, right fifth rib laterally, the T12 vertebral body, right iliac crest, left upper acetabulum, left femur intertrochanteric region and the right femur intertrochanteric region.   Past/Anticipated chemotherapy by medical oncology, if any:  Dr. Irene Limbo 06/24/2018 -Increase 33m Oxycodone to 1-2 tablets. -Will again order BM bx for new baseline and complete characterization. -Will refer the pt to Rad Onc for RT to spine tumor, hip, and other involved sites. -Will refer pt to neurosurgery for evaluation of his spinal myeloma lesion to determine appropriate course for spine stablization. -I am also recommending a referral [place] in his care.   Pain on a scale of 0-10 is: Bilateral hips, right shoulder   If Spine Met(s), symptoms, if any, include:  Bowel/Bladder retention or incontinence (please describe): No  Numbness or weakness in extremities (please describe): No  Current Decadron regimen, if applicable: 20 mg day after each dose of Daratumumab.  Ambulatory status? Walker? Wheelchair?: Ambulatory  SAFETY ISSUES:  Prior radiation? 4 fractions palliative RT to back  Pacemaker/ICD? No  Possible current pregnancy? N/A  Is the patient on methotrexate? No  Current Complaints / other details:   -[place] He is not recommended to have prophylactic  fixations to prevent fracture, has been recommended to pursue RT.  He was also recommended to see neurosurgery for his spine tumors.  -May 2018- mid back pain, T10 vertebral body mass with additional lytic lesion at T9.  Patient began maintenance 585mm2 Carfilzomib every 2 weeks on 06/16/2017.  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  July 2018 BM Bx- 20% monoclonal plasma cells and FISH revealed a 17p deletion.

## 2018-06-29 NOTE — Progress Notes (Signed)
Radiation Oncology         [phone] ________________________________  Initial Outpatient Consultation - Conducted via telephone due to current COVID-19 concerns for limiting patient exposure  I spoke with the patient to conduct this consult visit via telephone to spare the patient unnecessary potential exposure in the healthcare setting during the current COVID-19 pandemic. The patient was notified in advance and was offered a [place] meeting to allow for face to face communication but unfortunately reported that they did not have the appropriate resources/technology to support such a visit and instead preferred to proceed with a telephone consult.  ________________________________  Name: Jesus [name]        MRN: [id number]  Date of Service: 06/29/2018 DOB: 22-Nov-1955  MC:[id number], No Pcp Per  Brunetta Genera, MD     REFERRING PHYSICIAN: Brunetta Genera, MD   DIAGNOSIS: The encounter diagnosis was Multiple myeloma not having achieved remission ([place]).   HISTORY OF PRESENT ILLNESS: Jesus [name] is a 63 y.o. male seen at the request of Dr. Irene Limbo for a history of multiple myeloma that was diagnosed in the summer of 2018 after complaining of pain in the back which identified a lesion at T10 and T9. He received 4 fractions of palliative radiotherapy but this was discontinued due to pathologic fracture and cord compression. He underwent a corpectomy of T10 and T8-L1 fusion. He completed systemic therapy and was elligible for autologous stem cell transplant. He tolerated this process and was started on maintenance Carfilzomib. In the last few months he developed progressive bilateral hip pain, left greater than right and right shoulder pain. He underwent PET imaging on 06/09/2018 revealing multifocal hypermetabolic changes. In the spine T12 had changes concerning for paravertebral body extension and intraspinal extension was difficult to exclude. He also had lytic changes of the left  upper acetabulum and intertrochanteric femure. Other sites included his right hip, right shoulder, and calvarium. It appears that L4 and T1 had changes consistent with previous treatment. Given the patient's pain, he's been given narcotic pain medication and has been referred for consideration of palliative radiotherapy.       PREVIOUS RADIATION THERAPY: Yes   08/19/16-08/24/16:  Thoracic spine levels T9-T11 received 12 Gy. He was planning to receive 30 Gy in 10 fractions but this was inturuppted by emergent surgery due to cord compression of T10. He did not complete his course. He was treated in [place], [place] with Dr. Lysle Morales   PAST MEDICAL HISTORY:  Past Medical History:  Diagnosis Date   Cancer [facility])        PAST SURGICAL HISTORY: Past Surgical History:  Procedure Laterality Date   BACK SURGERY     IR IMAGING GUIDED PORT INSERTION  06/21/2018     FAMILY HISTORY:  Family History  Problem Relation Age of Onset   Breast cancer Sister      SOCIAL HISTORY:  reports that he has never smoked. He has never used smokeless tobacco. He reports that he does not drink alcohol or use drugs. The patient is married. He's originally from [place]. He has worked for [employer] and has been based in [place], [place] but comes home to [place] regularly. He has been furloughed due to the coronavirus and plans to remain here in [place] for the foreseeable future.   ALLERGIES: Penicillins   MEDICATIONS:  Current Outpatient Medications  Medication Sig Dispense Refill   acetaminophen (TYLENOL) 325 MG tablet Take by mouth.     acyclovir (ZOVIRAX) 400 MG  tablet Take 1 tablet (400 mg total) by mouth 2 (two) times daily. 60 tablet 2   acyclovir (ZOVIRAX) 400 MG tablet Take 1 tablet (400 mg total) by mouth 2 (two) times daily. 60 tablet 11   aspirin 81 MG EC tablet Take by mouth.     atovaquone-proguanil (MALARONE) 250-100 MG TABS tablet TAKE 1 TABLET BY MOUTH ONCE DAILY FOR  25 DAYS BEGIN 1 2 DAYS BEFORE AND CONTINUE UNTIL 1 WEEK AFTER EXPOSURE FOR PREVENTION OF MALARIA     buPROPion (WELLBUTRIN SR) 150 MG 12 hr tablet 1 pill a day for 3 days.  Then BID     Calcium Carb-Cholecalciferol (CALCIUM 1000 + D) 1000-800 MG-UNIT TABS Take 1,000 mg by mouth once.     Cholecalciferol (VITAMIN D3) 25 MCG (1000 UT) CAPS Take by mouth.     ciprofloxacin (CIPRO) 500 MG tablet TAKE 1 TABLET BY MOUTH TWICE DAILY FOR 5 DAYS MAY TAKE FOR 3 5 DAYS FOR TRAVELERS DIARRHEA MAY DISCONTINUE WHEN SYMPTOMS RESOLVED     dexamethasone (DECADRON) 4 MG tablet Take 5 tablets (63m) with breakfast the day after each dose of Daratumumab 120 tablet 1   doxycycline (VIBRA-TABS) 100 MG tablet Take by mouth.     dronabinol (MARINOL) 10 MG capsule Take by mouth.     gabapentin (NEURONTIN) 300 MG capsule Take 1 capsule (300 mg total) by mouth 3 (three) times daily. 90 capsule 2   lidocaine-prilocaine (EMLA) cream Apply to affected area once 30 g 3   LORazepam (ATIVAN) 0.5 MG tablet Take by mouth.     morphine (MS CONTIN) 15 MG 12 hr tablet Take 1 tablet (15 mg total) by mouth every 12 (twelve) hours. 60 tablet 0   Multiple Vitamins-Minerals (ONE DAILY CALCIUM/IRON) TABS Take by mouth.     ondansetron (ZOFRAN) 4 MG tablet Take 1 tablet (4 mg total) by mouth every 8 (eight) hours as needed for nausea or vomiting. 30 tablet 0   ondansetron (ZOFRAN) 8 MG tablet Take 1 tablet (8 mg total) by mouth 2 (two) times daily as needed (Nausea or vomiting). 30 tablet 1   ondansetron (ZOFRAN-ODT) 4 MG disintegrating tablet Place under the tongue.     oxyCODONE (OXY IR/ROXICODONE) 5 MG immediate release tablet Take 1 tablet (5 mg total) by mouth every 4 (four) hours as needed for severe pain. 120 tablet 0   pomalidomide (POMALYST) 2 MG capsule Take 1 capsule (2 mg total) by mouth daily. Take with water on days 1-21. Repeat every 28 days. [employer] auth# [id number] capsule 0   prochlorperazine (COMPAZINE) 10  MG tablet Take 1 tablet (10 mg total) by mouth every 6 (six) hours as needed (Nausea or vomiting). 30 tablet 1   senna-docusate (SENOKOT-S) 8.6-50 MG tablet Take by mouth.     traMADol (ULTRAM) 50 MG tablet TAKE 1 TABLET BY MOUTH EVERY 6 HOURS AS NEEDED FOR MODERATE PAIN OR SEVERE PAIN     No current facility-administered medications for this encounter.      REVIEW OF SYSTEMS: On review of systems, the patient reports that he is having trouble with pain in the hips particularly in the left. He reports he's had some mild discomfort in his low back as well, and denies any weakness or sensory changes of his torso or lower extremities. He does describe pain that limits his ability to walk as well.  He denies any bowel or bladder disturbances, and denies abdominal pain, nausea or vomiting. He denies any additional musculoskeletal  or joint aches or pains. A complete review of systems is obtained and is otherwise negative.     PHYSICAL EXAM:  Unable to assess due to type of encounter   ECOG = 2  0 - Asymptomatic (Fully active, able to carry on all predisease activities without restriction)  1 - Symptomatic but completely ambulatory (Restricted in physically strenuous activity but ambulatory and able to carry out work of a light or sedentary nature. For example, light housework, office work)  2 - Symptomatic, <50% in bed during the day (Ambulatory and capable of all self care but unable to carry out any work activities. Up and about more than 50% of waking hours)  3 - Symptomatic, >50% in bed, but not bedbound (Capable of only limited self-care, confined to bed or chair 50% or more of waking hours)  4 - Bedbound (Completely disabled. Cannot carry on any self-care. Totally confined to bed or chair)  5 - Death   Jesus [name] MM, Creech RH, Tormey DC, et al. [phone]). "Toxicity and response criteria of the [facility]". [place] Oncol. 5 (6): 649-55    LABORATORY DATA:  Lab  Results  Component Value Date   WBC 5.2 06/24/2018   HGB 11.7 (L) 06/24/2018   HCT 35.8 (L) 06/24/2018   MCV 89.9 06/24/2018   PLT 223 06/24/2018   Lab Results  Component Value Date   NA 137 06/24/2018   K 4.5 06/24/2018   CL 102 06/24/2018   CO2 27 06/24/2018   Lab Results  Component Value Date   ALT 27 06/24/2018   AST 15 06/24/2018   ALKPHOS 83 06/24/2018   BILITOT 0.5 06/24/2018      RADIOGRAPHY: Nm Pet Image Restage (ps) Whole Body  Result Date: 06/09/2018 CLINICAL DATA:  Subsequent treatment strategy for multiple myeloma. Back pain, prior T10 corpectomy. Prior care in [place]. EXAM: NUCLEAR MEDICINE PET WHOLE BODY TECHNIQUE: 6.1 mCi F-18 FDG was injected intravenously. Full-ring PET imaging was performed from the skull base to thigh after the radiotracer. CT data was obtained and used for attenuation correction and anatomic localization. Fasting blood glucose: 99 mg/dl COMPARISON:  None. FINDINGS: Mediastinal blood pool activity: SUV max 2.3 HEAD/NECK: No significant abnormal hypermetabolic activity in this region. Incidental CT findings: none CHEST: No significant abnormal hypermetabolic activity in this region. Incidental CT findings: Mild atherosclerotic calcification of the aortic arch. Mild cardiomegaly. Minimal biapical pleuroparenchymal scarring. Fatty lesion along the right posterior pleural margin, likely representing a small hernia of adipose tissue, less likely a pleural lipoma. ABDOMEN/PELVIS: No significant abnormal hypermetabolic activity in this region. Incidental CT findings: 4 mm gallstone subtle dependently in the gallbladder. Prostatomegaly. SKELETON: Primarily lytic hypermetabolic lesions are present scattered in the skeleton. Visualize lesions include the right C3 vertebral body; the right proximal humeral head and shaft; the right posterior glenoid; the left T2 posterior elements; the left medial clavicle; the right fifth rib laterally; the T12 vertebral body;  the right iliac crest; the left upper acetabulum; the left femur intertrochanteric region; and the right femur intertrochanteric region. Index activity in the left upper acetabulum with maximum SUV 11.4 and mild lateral cortical breakthrough. Index activity in the T12 vertebral body with maximum SUV 9.9 and lucency surrounding the right pedicle screw, and right lateral cortical destruction of the vertebral body potentially with some paravertebral extension of tumor. Index activity in the left medial clavicle with maximum SUV 11.6. The lytic lesion in the left intertrochanteric portion of the femur has  maximum SUV of 9.0 and is significantly larger than the contralateral hip lesion. Incidental CT findings: There are some small speckled hypodensities in the calvarium but without accentuated metabolic activity. Other lucency such a lucency in the right side of the T1 vertebral body on image 69/4 likewise do not demonstrate hypermetabolic activity and may represent previously effectively treated lesions. The corpectomy site is noted some lucency along the right side of the corpectomy site which is not hypermetabolic. A lucent lesion in the left L4 pedicle is likewise not hypermetabolic. EXTREMITIES: Situated between the iloipsoas and the rectus femoris muscle proximally along the anterior margin of the left hip, a 6.8 by 9.0 by 13.6 cm fatty mass is present without accentuated metabolic activity, compatible with lipoma. Likewise there is a lipoma along the margin of the right brachialis muscle just proximal to the elbow for example on image 128/4. There is diffuse subcutaneous edema in the right upper arm and right forearm, especially in the forearm, with low-grade associated nonfocal metabolic activity, significance uncertain. Incidental CT findings: none IMPRESSION: 1. Multifocal hypermetabolic lytic lesions in the skeleton as noted above, compatible with active myeloma. In the spine, the most notable active lesion of  concern is at the T12 level with there is a large right eccentric vertebral body lesion with demineralization of the cortex and likely some paravertebral extension of tumor. This could cause loosening of the right pedicle screw. Intraspinal extension of tumor is difficult to exclude. MRI might be considered although the posterolateral rod and pedicle screws may cause artifact. 2. There is likewise cortical breakthrough associated with the lytic lesion along the left upper acetabulum. 3. A lesion in the left intertrochanteric area of the femur is large enough to potentially cause biomechanical weakening which increases risk of fracture. 4. Other skeletal sites of active involvement are detailed above in the skeleton section. 5. Several lucent lesions are observed including the calvarium, T1 vertebral body, and left L4 pedicle which are not hypermetabolic and may represent effectively previously treated lesions. 6. 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 There is also a lipoma along the right brachialis muscle. 7. Nonspecific subcutaneous edema especially in the right forearm but also extending up into the right upper arm. Cause is uncertain. There is some low-grade nonfocal metabolic activity along this area. Strictly speaking, cellulitis is not excluded, correlate with clinical history and visual inspection. 8. Other imaging findings of potential clinical significance: Aortic Atherosclerosis (ICD10-I70.0). Mild cardiomegaly. Cholelithiasis. Prostatomegaly. Electronically Signed   By: Van Clines M.D.   On: 06/09/2018 15:14   Ir Imaging Guided Port Insertion  Result Date: 06/21/2018 CLINICAL DATA:  Port a cath placement for chemotherapy for relapsed myeloma EXAM: TUNNELED PORT CATHETER PLACEMENT WITH ULTRASOUND AND FLUOROSCOPIC GUIDANCE FLUOROSCOPY TIME:  0.2 minutes; 18  uGym2 DAP ANESTHESIA/SEDATION: Intravenous Fentanyl 168mg and  Versed 467mwere administered as conscious sedation during continuous monitoring of the patient's level of consciousness and physiological / cardiorespiratory status by the radiology RN, with a total moderate sedation time of 30 minutes. TECHNIQUE: The procedure, risks, benefits, and alternatives were explained to the patient. Questions regarding the procedure were encouraged and answered. The patient understands and consents to the procedure. As antibiotic prophylaxis, vancomycin 1 g was ordered pre-procedure and administered intravenously within one hour of incision. Patency of the right IJ vein was confirmed with ultrasound with image documentation. An appropriate skin site was determined. Skin site was marked.  Region was prepped using maximum barrier technique including cap and mask, sterile gown, sterile gloves, large sterile sheet, and Chlorhexidine as cutaneous antisepsis. The region was infiltrated locally with 1% lidocaine. Under real-time ultrasound guidance, the right IJ vein was accessed with a 21 gauge micropuncture needle; the needle tip within the vein was confirmed with ultrasound image documentation. Needle was exchanged over a 018 guidewire for transitional dilator which allowed passage of the [facility] wire into the IVC. Over this, the transitional dilator was exchanged for a 5 [place] MPA catheter. A small incision was made on the right anterior chest wall and a subcutaneous pocket fashioned. The power-injectable port was positioned and its catheter tunneled to the right IJ dermatotomy site. The MPA catheter was exchanged over an Amplatz wire for a peel-away sheath, through which the port catheter, which had been trimmed to the appropriate length, was advanced and positioned under fluoroscopy with its tip at the cavoatrial junction. Spot chest radiograph confirms good catheter position and no pneumothorax. The pocket was closed with deep interrupted and subcuticular continuous 3-0 Monocryl sutures.  The port was flushed per protocol. The incisions were covered with Dermabond then covered with a sterile dressing. COMPLICATIONS: COMPLICATIONS None immediate IMPRESSION: Technically successful right IJ power-injectable port catheter placement. Ready for routine use. Electronically Signed   By: Lucrezia Europe M.D.   On: 06/21/2018 13:17   Vas [place] Upper Extremity Venous Duplex  Result Date: 06/12/2018 UPPER VENOUS STUDY  Indications: Swelling, and Pain Performing Technologist: Abram Sander RVS  Examination Guidelines: A complete evaluation includes B-mode imaging, spectral Doppler, color Doppler, and power Doppler as needed of all accessible portions of each vessel. Bilateral testing is considered an integral part of a complete examination. Limited examinations for reoccurring indications may be performed as noted.  Right Findings: +----------+------------+---------+-----------+----------+-------+  RIGHT      Compressible Phasicity Spontaneous Properties Summary  +----------+------------+---------+-----------+----------+-------+  IJV            Full        Yes        Yes                         +----------+------------+---------+-----------+----------+-------+  Subclavian     Full        Yes        Yes                         +----------+------------+---------+-----------+----------+-------+  Axillary       Full        Yes        Yes                         +----------+------------+---------+-----------+----------+-------+  Brachial       Full        Yes        Yes                         +----------+------------+---------+-----------+----------+-------+  Radial         Full                                               +----------+------------+---------+-----------+----------+-------+  Ulnar          Full                                               +----------+------------+---------+-----------+----------+-------+  Cephalic       Full                                                +----------+------------+---------+-----------+----------+-------+  Basilic        Full                                               +----------+------------+---------+-----------+----------+-------+  Left Findings: +----+------------+---------+-----------+----------+-------+  LEFT Compressible Phasicity Spontaneous Properties Summary  +----+------------+---------+-----------+----------+-------+  IJV                  Yes        Yes                         +----+------------+---------+-----------+----------+-------+  Summary:  Right: No evidence of deep vein thrombosis in the upper extremity. No evidence of superficial vein thrombosis in the upper extremity.  Left: No evidence of thrombosis in the subclavian.  *See table(s) above for measurements and observations.  Diagnosing physician: Harold Barban MD Electronically signed by Harold Barban MD on 06/12/2018 at 2:19:58 PM.    Final        IMPRESSION/PLAN: 1. 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 Dr. Lisbeth Renshaw discusses the pathology findings and reviews the nature of multiple myeloma and scenarios for which radiotherapy may help painful bony disease. The patient is symptomatic in bilateral hips, his right shoulder, and mid low back. We discussed the risks, benefits, short, and long term effects of radiotherapy, and the patient is interested in proceeding. Dr. Lisbeth Renshaw discusses the delivery and logistics of radiotherapy and anticipates a course of 2 weeks of radiotherapy to the right shoulder, left hip, possibly the right hip which will be further imaged at the time of simulation, and T12. We have received his dosimetry records as well from his previous radiation plan. He will be contacted to coordinate simulation. Verbal consent was given and he will sign formal consent at the time of simulation. He is also being considered for second opinion at [facility] transplant service. We will follow this expectantly. The patient is  clinically neurologically intact as well but will let us know if he starts having any neurologic symptoms.   Given current concerns for patient exposure during the COVID-19 pandemic, this encounter was conducted via telephone.  The patient has given verbal consent for this type of encounter. The time spent during this encounter was 30 minutes and 50% of that time was spent in the coordination of his care. The attendants for this meeting included Dr. Lisbeth Renshaw,  Shona Simpson, [facility], and Norman Clay  During the encounter, Dr. Lisbeth Renshaw and Shona Simpson [facility] were located at [facility] Radiation Oncology Department.  Jesus [name]  was located at home.   The above documentation reflects my direct findings during this shared patient visit. Please see the separate note by Dr. Lisbeth Renshaw on this date for the remainder of the patient's plan of care.    Carola Rhine, PAC

## 2018-06-30 ENCOUNTER — Other Ambulatory Visit: Payer: Self-pay

## 2018-06-30 ENCOUNTER — Encounter (HOSPITAL_COMMUNITY): Payer: Self-pay

## 2018-06-30 ENCOUNTER — Ambulatory Visit (HOSPITAL_COMMUNITY)
Admission: RE | Admit: 2018-06-30 | Discharge: 2018-06-30 | Disposition: A | Discharge status: Home / Self Care | Payer: 59 | Source: Ambulatory Visit | Attending: Hematology | Admitting: Hematology

## 2018-06-30 ENCOUNTER — Other Ambulatory Visit: Payer: Self-pay | Admitting: *Deleted

## 2018-06-30 ENCOUNTER — Telehealth: Payer: Self-pay | Admitting: *Deleted

## 2018-06-30 DIAGNOSIS — C9000 Multiple myeloma not having achieved remission: Secondary | ICD-10-CM | POA: Diagnosis present

## 2018-06-30 DIAGNOSIS — M549 Dorsalgia, unspecified: Secondary | ICD-10-CM | POA: Diagnosis not present

## 2018-06-30 DIAGNOSIS — Z79899 Other long term (current) drug therapy: Secondary | ICD-10-CM | POA: Diagnosis not present

## 2018-06-30 DIAGNOSIS — D649 Anemia, unspecified: Secondary | ICD-10-CM | POA: Insufficient documentation

## 2018-06-30 DIAGNOSIS — G8929 Other chronic pain: Secondary | ICD-10-CM | POA: Insufficient documentation

## 2018-06-30 DIAGNOSIS — Z7982 Long term (current) use of aspirin: Secondary | ICD-10-CM | POA: Diagnosis not present

## 2018-06-30 DIAGNOSIS — D72819 Decreased white blood cell count, unspecified: Secondary | ICD-10-CM | POA: Diagnosis not present

## 2018-06-30 LAB — CBC WITH DIFFERENTIAL/PLATELET
Abs Immature Granulocytes: 0.02 10*3/uL (ref 0.00–0.07)
Basophils Absolute: 0 10*3/uL (ref 0.0–0.1)
Basophils Relative: 0 %
Eosinophils Absolute: 0.1 10*3/uL (ref 0.0–0.5)
Eosinophils Relative: 3 %
HCT: 35.6 % — ABNORMAL LOW (ref 39.0–52.0)
Hemoglobin: 11.7 g/dL — ABNORMAL LOW (ref 13.0–17.0)
Immature Granulocytes: 1 %
Lymphocytes Relative: 14 %
Lymphs Abs: 0.5 10*3/uL — ABNORMAL LOW (ref 0.7–4.0)
MCH: 30 pg (ref 26.0–34.0)
MCHC: 32.9 g/dL (ref 30.0–36.0)
MCV: 91.3 fL (ref 80.0–100.0)
Monocytes Absolute: 0.3 10*3/uL (ref 0.1–1.0)
Monocytes Relative: 8 %
Neutro Abs: 2.7 10*3/uL (ref 1.7–7.7)
Neutrophils Relative %: 74 %
Platelets: 192 10*3/uL (ref 150–400)
RBC: 3.9 MIL/uL — ABNORMAL LOW (ref 4.22–5.81)
RDW: 14.6 % (ref 11.5–15.5)
WBC: 3.7 10*3/uL — ABNORMAL LOW (ref 4.0–10.5)
nRBC: 0 % (ref 0.0–0.2)

## 2018-06-30 MED ORDER — HEPARIN SOD (PORK) LOCK FLUSH 100 UNIT/ML IV SOLN
250.0000 [IU] | INTRAVENOUS | Status: AC | PRN
  Administered 2018-06-30: 11:00:00 250 [IU]
  Filled 2018-06-30: qty 5

## 2018-06-30 MED ORDER — SODIUM CHLORIDE 0.9 % IV SOLN
INTRAVENOUS | Status: DC
  Administered 2018-06-30: 08:00:00 via INTRAVENOUS

## 2018-06-30 MED ORDER — FENTANYL CITRATE (PF) 100 MCG/2ML IJ SOLN
INTRAMUSCULAR | Status: AC | PRN
  Administered 2018-06-30 (×2): 50 ug via INTRAVENOUS

## 2018-06-30 MED ORDER — MIDAZOLAM HCL 2 MG/2ML IJ SOLN
INTRAMUSCULAR | Status: AC
  Filled 2018-06-30: qty 4

## 2018-06-30 MED ORDER — MIDAZOLAM HCL 2 MG/2ML IJ SOLN
INTRAMUSCULAR | Status: AC | PRN
  Administered 2018-06-30 (×2): 1 mg via INTRAVENOUS

## 2018-06-30 MED ORDER — LIDOCAINE-EPINEPHRINE (PF) 1 %-1:200000 IJ SOLN
INTRAMUSCULAR | Status: AC | PRN
  Administered 2018-06-30: 10 mL

## 2018-06-30 MED ORDER — FENTANYL CITRATE (PF) 100 MCG/2ML IJ SOLN
INTRAMUSCULAR | Status: AC
  Filled 2018-06-30: qty 2

## 2018-06-30 NOTE — H&P (Signed)
Referring Physician(s): Brunetta Genera  Supervising Physician: Sandi Mariscal  Patient Status:  [facility]  Chief Complaint: "I'm having a bone marrow biopsy"   Subjective: Patient familiar to our service from prior Port-A-Cath placement on 06/21/2018.  He has a history of relapsing multiple myeloma and presents again today for CT-guided bone marrow biopsy for further evaluation/prior to new treatment protocol.  He currently denies fever, headache, chest pain, dyspnea, cough, abdominal pain, nausea, vomiting or bleeding.  He does have chronic back pain.  Past Medical History:  Diagnosis Date  . Cancer [facility])    Past Surgical History:  Procedure Laterality Date  . BACK SURGERY    . IR IMAGING GUIDED PORT INSERTION  06/21/2018      Allergies: Penicillins  Medications: Prior to Admission medications   Medication Sig Start Date End Date Taking? Authorizing Provider  acetaminophen (TYLENOL) 325 MG tablet Take by mouth. 09/07/16  Yes [provider]  acyclovir (ZOVIRAX) 400 MG tablet Take 1 tablet (400 mg total) by mouth 2 (two) times daily. 06/15/18  Yes Brunetta Genera, MD  aspirin 81 MG EC tablet Take by mouth.   Yes [provider]  buPROPion (WELLBUTRIN SR) 150 MG 12 hr tablet 1 pill a day for 3 days.  Then BID 01/11/18  Yes [provider]  Calcium Carb-Cholecalciferol (CALCIUM 1000 + D) 1000-800 MG-UNIT TABS Take 1,000 mg by mouth once.   Yes [provider]  Cholecalciferol (VITAMIN D3) 25 MCG (1000 UT) CAPS Take by mouth. 10/08/17  Yes [provider]  dronabinol (MARINOL) 10 MG capsule Take by mouth. 04/15/18  Yes [provider]  gabapentin (NEURONTIN) 300 MG capsule Take 1 capsule (300 mg total) by mouth 3 (three) times daily. 06/06/18  Yes Harle Stanford., PA-C  lidocaine-prilocaine (EMLA) cream Apply to affected area once 06/15/18  Yes Brunetta Genera, MD  morphine (MS CONTIN) 15 MG 12 hr tablet Take 1 tablet (15  mg total) by mouth every 12 (twelve) hours. 06/17/18  Yes Brunetta Genera, MD  Multiple Vitamins-Minerals (ONE DAILY CALCIUM/IRON) TABS Take by mouth.   Yes [provider]  ondansetron (ZOFRAN) 4 MG tablet Take 1 tablet (4 mg total) by mouth every 8 (eight) hours as needed for nausea or vomiting. 04/20/18  Yes Brunetta Genera, MD  ondansetron (ZOFRAN) 8 MG tablet Take 1 tablet (8 mg total) by mouth 2 (two) times daily as needed (Nausea or vomiting). 06/15/18  Yes Brunetta Genera, MD  ondansetron (ZOFRAN-ODT) 4 MG disintegrating tablet Place under the tongue. 08/16/17  Yes [provider]  oxyCODONE (OXY IR/ROXICODONE) 5 MG immediate release tablet Take 1 tablet (5 mg total) by mouth every 4 (four) hours as needed for severe pain. 06/17/18  Yes Brunetta Genera, MD  prochlorperazine (COMPAZINE) 10 MG tablet Take 1 tablet (10 mg total) by mouth every 6 (six) hours as needed (Nausea or vomiting). 06/15/18  Yes Brunetta Genera, MD  senna-docusate (SENOKOT-S) 8.6-50 MG tablet Take by mouth. 08/31/16  Yes [provider]  acyclovir (ZOVIRAX) 400 MG tablet Take 1 tablet (400 mg total) by mouth 2 (two) times daily. 05/10/18   Brunetta Genera, MD  atovaquone-proguanil (MALARONE) 250-100 MG TABS tablet TAKE 1 TABLET BY MOUTH ONCE DAILY FOR 25 DAYS BEGIN 1 2 DAYS BEFORE AND CONTINUE UNTIL 1 WEEK AFTER EXPOSURE FOR PREVENTION OF MALARIA 03/24/18   [provider]  ciprofloxacin (CIPRO) 500 MG tablet TAKE 1 TABLET BY MOUTH  TWICE DAILY FOR 5 DAYS MAY TAKE FOR 3 5 DAYS FOR TRAVELERS DIARRHEA MAY DISCONTINUE WHEN SYMPTOMS RESOLVED 03/24/18   [provider]  dexamethasone (DECADRON) 4 MG tablet Take 5 tablets (33m) with breakfast the day after each dose of Daratumumab 06/15/18   KBrunetta Genera MD  doxycycline (VIBRA-TABS) 100 MG tablet Take by mouth. 06/10/18   [provider]  LORazepam (ATIVAN) 0.5 MG tablet Take by mouth. 02/22/17    [provider]  pomalidomide (POMALYST) 2 MG capsule Take 1 capsule (2 mg total) by mouth daily. Take with water on days 1-21. Repeat every 28 days. [employer] auth# [id number]   KBrunetta Genera MD  traMADol (ULTRAM) 50 MG tablet TAKE 1 TABLET BY MOUTH EVERY 6 HOURS AS NEEDED FOR MODERATE PAIN OR SEVERE PAIN 06/10/18   [provider]     Vital Signs: BP (!) 143/86 (BP Location: Right Arm)   Pulse 81   Temp 98.4 F (36.9 C) (Oral)   Resp 18   SpO2 96%   Physical Exam awake, alert.  Chest clear to auscultation bilaterally.  Clean, intact right chest wall Port-A-Cath.  Heart with normal rate, some occasional ectopy noted.  Abdomen soft, positive bowel sounds, nontender.  No lower extremity edema.  Imaging: No results found.  Labs:  CBC: Recent Labs    06/06/18 0836 06/17/18 0742 06/24/18 0855 06/30/18 0756  WBC 4.7 5.1 5.2 3.7*  HGB 12.3* 12.7* 11.7* 11.7*  HCT 36.9* 39.4 35.8* 35.6*  PLT 204 243 223 192    COAGS: Recent Labs    06/21/18 0846  INR 1.0    BMP: Recent Labs    05/23/18 0943 06/06/18 0836 06/17/18 0742 06/24/18 0855  NA 138 138 138 137  K 4.1 3.5 4.2 4.5  CL 102 106 101 102  CO2 '26 22 27 27  ' GLUCOSE 84 112* 104* 108*  BUN '9 8 9 12  ' CALCIUM 9.9 9.3 10.1 9.8  CREATININE 0.77 0.76 0.80 0.73  GFRNONAA >60 >60 >60 >60  GFRAA >60 >60 >60 >60    LIVER FUNCTION TESTS: Recent Labs    05/23/18 0943 06/06/18 0836 06/17/18 0742 06/24/18 0855  BILITOT 0.7 0.5 0.4 0.5  AST 15 15 39 15  ALT 17 19 58* 27  ALKPHOS 70 68 108 83  PROT 7.2 7.1 7.6 7.0  ALBUMIN 4.2 4.2 4.2 3.9    Assessment and Plan: Patient with history of relapsing multiple myeloma; presents today for CT-guided bone marrow biopsy for further evaluation/prior to new treatment.Risks and benefits of procedure was discussed with the patient  including, but not limited to bleeding, infection, damage to adjacent structures or low yield requiring additional tests.   All of the questions were answered and there is agreement to proceed.  Consent signed and in chart.     Electronically Signed: D. KRowe Robert PA-C 06/30/2018, 8:21 AM   I spent a total of 20 minutes at the the patient's bedside AND on the patient's hospital floor or unit, greater than 50% of which was counseling/coordinating care for CT-guided bone marrow biopsy

## 2018-06-30 NOTE — Procedures (Signed)
Pre-procedure Diagnosis: Multiple myeloma Post-procedure Diagnosis: Same  Technically successful CT guided bone marrow aspiration and biopsy of left iliac crest.   Complications: None Immediate  EBL: None  Signed: Sandi Mariscal Pager: [phone] 06/30/2018, 9:36 AM

## 2018-06-30 NOTE — Discharge Instructions (Signed)
Moderate Conscious Sedation, Adult, Care After These instructions provide you with information about caring for yourself after your procedure. Your health care provider may also give you more specific instructions. Your treatment has been planned according to current medical practices, but problems sometimes occur. Call your health care provider if you have any problems or questions after your procedure. What can I expect after the procedure? After your procedure, it is common:  To feel sleepy for several hours.  To feel clumsy and have poor balance for several hours.  To have poor judgment for several hours.  To vomit if you eat too soon. Follow these instructions at home: For at least 24 hours after the procedure:   Do not: ? Participate in activities where you could fall or become injured. ? Drive. ? Use heavy machinery. ? Drink alcohol. ? Take sleeping pills or medicines that cause drowsiness. ? Make important decisions or sign legal documents. ? Take care of children on your own.  Rest. Eating and drinking  Follow the diet recommended by your health care provider.  If you vomit: ? Drink water, juice, or soup when you can drink without vomiting. ? Make sure you have little or no nausea before eating solid foods. General instructions  Have a responsible adult stay with you until you are awake and alert.  Take over-the-counter and prescription medicines only as told by your health care provider.  If you smoke, do not smoke without supervision.  Keep all follow-up visits as told by your health care provider. This is important. Contact a health care provider if:  You keep feeling nauseous or you keep vomiting.  You feel light-headed.  You develop a rash.  You have a fever. Get help right away if:  You have trouble breathing. This information is not intended to replace advice given to you by your health care provider. Make sure you discuss any questions you have  with your health care provider. Document Released: 11/09/2012 Document Revised: 06/24/2015 Document Reviewed: 05/11/2015 Elsevier Interactive Patient Education  2019 [place].   Bone Marrow Aspiration and Bone Marrow Biopsy, Adult, Care After This sheet gives you information about how to care for yourself after your procedure. Your health care provider may also give you more specific instructions. If you have problems or questions, contact your health care provider. What can I expect after the procedure? After the procedure, it is common to have:  Mild pain and tenderness.  Swelling.  Bruising. Follow these instructions at home: Puncture site care      Follow instructions from your health care provider about how to take care of the puncture site. Make sure you: ? Wash your hands with soap and water before you change your bandage (dressing). If soap and water are not available, use hand sanitizer. ? Change your dressing as told by your health care provider.  You may remove your dressing tomorrow.  Check your puncture siteevery day for signs of infection. Check for: ? More redness, swelling, or pain. ? More fluid or blood. ? Warmth. ? Pus or a bad smell. General instructions  Take over-the-counter and prescription medicines only as told by your health care provider.  Do not take baths, swim, or use a hot tub until your health care provider approves. Ask if you can take a shower or have a sponge bath.  You may shower tomorrow.  Return to your normal activities as told by your health care provider. Ask your health care provider what activities are  safe for you.  Do not drive for 24 hours if you were given a medicine to help you relax (sedative) during your procedure.  Keep all follow-up visits as told by your health care provider. This is important. Contact a health care provider if:  Your pain is not controlled with medicine. Get help right away if:  You have a  fever.  You have more redness, swelling, or pain around the puncture site.  You have more fluid or blood coming from the puncture site.  Your puncture site feels warm to the touch.  You have pus or a bad smell coming from the puncture site. These symptoms may represent a serious problem that is an emergency. Do not wait to see if the symptoms will go away. Get medical help right away. Call your local emergency services (911 in the [place] Do not drive yourself to the hospital. Summary  After the procedure, it is common to have mild pain, tenderness, swelling, and bruising.  Follow instructions from your health care provider about how to take care of the puncture site.  Get help right away if you have any symptoms of infection or if you have more blood or fluid coming from the puncture site. This information is not intended to replace advice given to you by your health care provider. Make sure you discuss any questions you have with your health care provider. Document Released: 08/08/2004 Document Revised: 05/04/2017 Document Reviewed: 07/03/2015 Elsevier Interactive Patient Education  2019 [employer].

## 2018-06-30 NOTE — Telephone Encounter (Signed)
Lab appt set up for am

## 2018-07-01 ENCOUNTER — Other Ambulatory Visit: Payer: Self-pay

## 2018-07-01 ENCOUNTER — Encounter: Payer: Self-pay | Admitting: Hematology

## 2018-07-01 ENCOUNTER — Inpatient Hospital Stay: Payer: 59

## 2018-07-01 VITALS — BP 128/71 | HR 84 | Temp 98.2°F | Resp 18

## 2018-07-01 DIAGNOSIS — C9000 Multiple myeloma not having achieved remission: Secondary | ICD-10-CM | POA: Diagnosis not present

## 2018-07-01 DIAGNOSIS — Z7189 Other specified counseling: Secondary | ICD-10-CM

## 2018-07-01 LAB — CBC WITH DIFFERENTIAL/PLATELET
Abs Immature Granulocytes: 0.02 10*3/uL (ref 0.00–0.07)
Basophils Absolute: 0 10*3/uL (ref 0.0–0.1)
Basophils Relative: 0 %
Eosinophils Absolute: 0.1 10*3/uL (ref 0.0–0.5)
Eosinophils Relative: 3 %
HCT: 35.6 % — ABNORMAL LOW (ref 39.0–52.0)
Hemoglobin: 11.8 g/dL — ABNORMAL LOW (ref 13.0–17.0)
Immature Granulocytes: 1 %
Lymphocytes Relative: 12 %
Lymphs Abs: 0.5 10*3/uL — ABNORMAL LOW (ref 0.7–4.0)
MCH: 30 pg (ref 26.0–34.0)
MCHC: 33.1 g/dL (ref 30.0–36.0)
MCV: 90.6 fL (ref 80.0–100.0)
Monocytes Absolute: 0.4 10*3/uL (ref 0.1–1.0)
Monocytes Relative: 9 %
Neutro Abs: 3.1 10*3/uL (ref 1.7–7.7)
Neutrophils Relative %: 75 %
Platelets: 205 10*3/uL (ref 150–400)
RBC: 3.93 MIL/uL — ABNORMAL LOW (ref 4.22–5.81)
RDW: 14.2 % (ref 11.5–15.5)
WBC: 4.1 10*3/uL (ref 4.0–10.5)
nRBC: 0 % (ref 0.0–0.2)

## 2018-07-01 LAB — CMP (CANCER CENTER ONLY)
ALT: 16 U/L (ref 0–44)
AST: 12 U/L — ABNORMAL LOW (ref 15–41)
Albumin: 3.8 g/dL (ref 3.5–5.0)
Alkaline Phosphatase: 80 U/L (ref 38–126)
Anion gap: 9 (ref 5–15)
BUN: 12 mg/dL (ref 8–23)
CO2: 25 mmol/L (ref 22–32)
Calcium: 9.9 mg/dL (ref 8.9–10.3)
Chloride: 103 mmol/L (ref 98–111)
Creatinine: 0.78 mg/dL (ref 0.61–1.24)
GFR, Est AFR Am: >60 mL/min
GFR, Estimated: >60 mL/min
Glucose, Bld: 98 mg/dL (ref 70–99)
Potassium: 4.3 mmol/L (ref 3.5–5.1)
Sodium: 137 mmol/L (ref 135–145)
Total Bilirubin: 0.6 mg/dL (ref 0.3–1.2)
Total Protein: 6.9 g/dL (ref 6.5–8.1)

## 2018-07-01 MED ORDER — SODIUM CHLORIDE 0.9% FLUSH
10.0000 mL | Freq: Once | INTRAVENOUS | Status: AC | PRN
  Administered 2018-07-01: 10:00:00 10 mL
  Filled 2018-07-01: qty 10

## 2018-07-01 MED ORDER — SODIUM CHLORIDE 0.9 % IV SOLN
Freq: Once | INTRAVENOUS | Status: AC
  Administered 2018-07-01: 10:00:00 via INTRAVENOUS
  Filled 2018-07-01: qty 250

## 2018-07-01 MED ORDER — HEPARIN SOD (PORK) LOCK FLUSH 100 UNIT/ML IV SOLN
500.0000 [IU] | Freq: Once | INTRAVENOUS | Status: AC | PRN
  Administered 2018-07-01: 14:00:00 500 [IU]
  Filled 2018-07-01: qty 5

## 2018-07-01 MED ORDER — FAMOTIDINE IN NACL 20-0.9 MG/50ML-% IV SOLN
20.0000 mg | Freq: Once | INTRAVENOUS | Status: AC
  Administered 2018-07-01: 10:00:00 20 mg via INTRAVENOUS

## 2018-07-01 MED ORDER — ACETAMINOPHEN 325 MG PO TABS
650.0000 mg | ORAL_TABLET | Freq: Once | ORAL | Status: AC
  Administered 2018-07-01: 10:00:00 650 mg via ORAL

## 2018-07-01 MED ORDER — DIPHENHYDRAMINE HCL 25 MG PO CAPS
50.0000 mg | ORAL_CAPSULE | Freq: Once | ORAL | Status: AC
  Administered 2018-07-01: 50 mg via ORAL

## 2018-07-01 MED ORDER — SODIUM CHLORIDE 0.9 % IV SOLN: 15.4000 mg/kg | Freq: Once | INTRAVENOUS | Status: DC

## 2018-07-01 MED ORDER — ACETAMINOPHEN 325 MG PO TABS
ORAL_TABLET | ORAL | Status: AC
  Filled 2018-07-01: qty 2

## 2018-07-01 MED ORDER — FAMOTIDINE IN NACL 20-0.9 MG/50ML-% IV SOLN
INTRAVENOUS | Status: AC
  Filled 2018-07-01: qty 50

## 2018-07-01 MED ORDER — SODIUM CHLORIDE 0.9 % IV SOLN
15.7000 mg/kg | Freq: Once | INTRAVENOUS | Status: AC
  Administered 2018-07-01: 12:00:00 900 mg via INTRAVENOUS
  Filled 2018-07-01: qty 40

## 2018-07-01 MED ORDER — DIPHENHYDRAMINE HCL 25 MG PO CAPS
ORAL_CAPSULE | ORAL | Status: AC
  Filled 2018-07-01: qty 2

## 2018-07-01 MED ORDER — SODIUM CHLORIDE 0.9% FLUSH
10.0000 mL | INTRAVENOUS | Status: DC | PRN
  Administered 2018-07-01: 14:00:00 10 mL
  Filled 2018-07-01: qty 10

## 2018-07-01 MED ORDER — SODIUM CHLORIDE 0.9 % IV SOLN
20.0000 mg | Freq: Once | INTRAVENOUS | Status: AC
  Administered 2018-07-01: 11:00:00 20 mg via INTRAVENOUS
  Filled 2018-07-01: qty 20

## 2018-07-01 NOTE — Progress Notes (Signed)
..  Accelerated Daratumumab Infusion Pharmacist Review   Jesus [name] is a 63 y.o. male being treated with daratumumab for multiple myeloma . This patient may be considered for the accelerated infusion protocol.    '[x]'$  Patient has received at least 2 prior doses of daratumumab at standard infusion rates.  Infusion Date 1 06/17/2018  Infusion Date 2  06/24/2018   '[x]'$  No documented infusion related reactions with prior infusions.   '[x]'$  Physician approval of accelerated 90 minute infusion daratumumab.   '[x]'$  Orders updated in current Treatment Plan to reflect accelerated 90 minute infusion protocol with further doses.   Philomena Course 07/01/18 10:39 AM   Resource: Angela Burke., et al. Debby Freiberg daratumumab infusion is safe in multiple myeloma. Leukemia. 2018;32:2495-2518. doi: 96.2229/[id number]

## 2018-07-01 NOTE — Progress Notes (Signed)
Met with patient to introduce myself as Arboriculturist and to offer available resources.  Discussed one-time $1 Engineer, drilling to assist with personal expenses while going through treatment. Advised what is needed to apply and if needed to apply for copay assistance for treatment. He verbalized understanding. He will bring requested information on 6/4 and other paperwork he had a question about.  He has my card for any additional financial questions or concerns.

## 2018-07-01 NOTE — Patient Instructions (Signed)
[facility] Discharge Instructions for Patients Receiving Chemotherapy  Today you received the following chemotherapy agents Darzalex  To help prevent nausea and vomiting after your treatment, we encourage you to take your nausea medication as prescribed    If you develop nausea and vomiting that is not controlled by your nausea medication, call the clinic.   BELOW ARE SYMPTOMS THAT SHOULD BE REPORTED IMMEDIATELY:  *FEVER GREATER THAN 100.5 F  *CHILLS WITH OR WITHOUT FEVER  NAUSEA AND VOMITING THAT IS NOT CONTROLLED WITH YOUR NAUSEA MEDICATION  *UNUSUAL SHORTNESS OF BREATH  *UNUSUAL BRUISING OR BLEEDING  TENDERNESS IN MOUTH AND THROAT WITH OR WITHOUT PRESENCE OF ULCERS  *URINARY PROBLEMS  *BOWEL PROBLEMS  UNUSUAL RASH Items with * indicate a potential emergency and should be followed up as soon as possible.  Feel free to call the clinic should you have any questions or concerns. The clinic phone number is [phone].  Please show the [place] at check-in to the Emergency Department and triage nurse.  Daratumumab injection What is this medicine? DARATUMUMAB (dar a toom ue mab) is a monoclonal antibody. It is used to treat multiple myeloma. This medicine may be used for other purposes; ask your health care provider or pharmacist if you have questions. COMMON BRAND NAME(S): DARZALEX What should I tell my health care provider before I take this medicine? They need to know if you have any of these conditions: -infection (especially a virus infection such as chickenpox, herpes, or hepatitis B virus) -lung or breathing disease -an unusual or allergic reaction to daratumumab, other medicines, foods, dyes, or preservatives -pregnant or trying to get pregnant -breast-feeding How should I use this medicine? This medicine is for infusion into a vein. It is given by a health care professional in a hospital or clinic setting. Talk to your pediatrician  regarding the use of this medicine in children. Special care may be needed. Overdosage: If you think you have taken too much of this medicine contact a poison control center or emergency room at once. NOTE: This medicine is only for you. Do not share this medicine with others. What if I miss a dose? Keep appointments for follow-up doses as directed. It is important not to miss your dose. Call your doctor or health care professional if you are unable to keep an appointment. What may interact with this medicine? Interactions have not been studied. Give your health care provider a list of all the medicines, herbs, non-prescription drugs, or dietary supplements you use. Also tell them if you smoke, drink alcohol, or use illegal drugs. Some items may interact with your medicine. This list may not describe all possible interactions. Give your health care provider a list of all the medicines, herbs, non-prescription drugs, or dietary supplements you use. Also tell them if you smoke, drink alcohol, or use illegal drugs. Some items may interact with your medicine. What should I watch for while using this medicine? This drug may make you feel generally unwell. Report any side effects. Continue your course of treatment even though you feel ill unless your doctor tells you to stop. This medicine can cause serious allergic reactions. To reduce your risk you may need to take medicine before treatment with this medicine. Take your medicine as directed. This medicine can affect the results of blood tests to match your blood type. These changes can last for up to 6 months after the final dose. Your healthcare provider will do blood tests to match your  blood type before you start treatment. Tell all of your healthcare providers that you are being treated with this medicine before receiving a blood transfusion. This medicine can affect the results of some tests used to determine treatment response; extra tests may be  needed to evaluate response. Do not become pregnant while taking this medicine or for 3 months after stopping it. Women should inform their doctor if they wish to become pregnant or think they might be pregnant. There is a potential for serious side effects to an unborn child. Talk to your health care professional or pharmacist for more information. What side effects may I notice from receiving this medicine? Side effects that you should report to your doctor or health care professional as soon as possible: -allergic reactions like skin rash, itching or hives, swelling of the face, lips, or tongue -breathing problems -chills -cough -dizziness -feeling faint or lightheaded -headache -low blood counts - this medicine may decrease the number of white blood cells, red blood cells and platelets. You may be at increased risk for infections and bleeding. -nausea, vomiting -shortness of breath -signs of decreased platelets or bleeding - bruising, pinpoint red spots on the skin, black, tarry stools, blood in the urine -signs of decreased red blood cells - unusually weak or tired, feeling faint or lightheaded, falls -signs of infection - fever or chills, cough, sore throat, pain or difficulty passing urine -signs and symptoms of liver injury like dark yellow or brown urine; general ill feeling or flu-like symptoms; light-colored stools; loss of appetite; right upper belly pain; unusually weak or tired; yellowing of the eyes or skin Side effects that usually do not require medical attention (report to your doctor or health care professional if they continue or are bothersome): -back pain -constipation -loss of appetite -diarrhea -joint pain -muscle cramps -pain, tingling, numbness in the hands or feet -swelling of the ankles, feet, hands -tiredness -trouble sleeping This list may not describe all possible side effects. Call your doctor for medical advice about side effects. You may report side  effects to FDA at 1-800-FDA-1088. Where should I keep my medicine? Keep out of the reach of children. This drug is given in a hospital or clinic and will not be stored at home. NOTE: This sheet is a summary. It may not cover all possible information. If you have questions about this medicine, talk to your doctor, pharmacist, or health care provider.  2019 Elsevier/Gold Standard (2017-08-20 15:52:44)

## 2018-07-04 ENCOUNTER — Ambulatory Visit: Payer: 59

## 2018-07-04 ENCOUNTER — Other Ambulatory Visit: Payer: 59

## 2018-07-04 ENCOUNTER — Ambulatory Visit: Payer: 59 | Admitting: Hematology

## 2018-07-04 ENCOUNTER — Encounter (HOSPITAL_COMMUNITY): Payer: Self-pay | Admitting: Hematology

## 2018-07-07 ENCOUNTER — Ambulatory Visit
Admission: RE | Admit: 2018-07-07 | Discharge: 2018-07-07 | Disposition: A | Discharge status: Home / Self Care | Payer: 59 | Source: Ambulatory Visit | Attending: Radiation Oncology | Admitting: Radiation Oncology

## 2018-07-07 ENCOUNTER — Other Ambulatory Visit: Payer: Self-pay

## 2018-07-07 DIAGNOSIS — Z510 Encounter for antineoplastic radiation therapy: Secondary | ICD-10-CM | POA: Diagnosis not present

## 2018-07-07 DIAGNOSIS — C9000 Multiple myeloma not having achieved remission: Secondary | ICD-10-CM | POA: Insufficient documentation

## 2018-07-08 ENCOUNTER — Inpatient Hospital Stay: Payer: 59 | Attending: Hematology

## 2018-07-08 ENCOUNTER — Other Ambulatory Visit: Payer: Self-pay

## 2018-07-08 ENCOUNTER — Inpatient Hospital Stay: Payer: 59

## 2018-07-08 VITALS — BP 111/73 | HR 75 | Temp 98.1°F | Resp 16

## 2018-07-08 DIAGNOSIS — Z7189 Other specified counseling: Secondary | ICD-10-CM

## 2018-07-08 DIAGNOSIS — Z7982 Long term (current) use of aspirin: Secondary | ICD-10-CM | POA: Insufficient documentation

## 2018-07-08 DIAGNOSIS — Z5112 Encounter for antineoplastic immunotherapy: Secondary | ICD-10-CM | POA: Diagnosis present

## 2018-07-08 DIAGNOSIS — C9000 Multiple myeloma not having achieved remission: Secondary | ICD-10-CM

## 2018-07-08 DIAGNOSIS — G893 Neoplasm related pain (acute) (chronic): Secondary | ICD-10-CM | POA: Diagnosis not present

## 2018-07-08 DIAGNOSIS — Z79899 Other long term (current) drug therapy: Secondary | ICD-10-CM | POA: Insufficient documentation

## 2018-07-08 DIAGNOSIS — N400 Enlarged prostate without lower urinary tract symptoms: Secondary | ICD-10-CM | POA: Insufficient documentation

## 2018-07-08 LAB — CBC WITH DIFFERENTIAL/PLATELET
Abs Immature Granulocytes: 0.01 10*3/uL (ref 0.00–0.07)
Basophils Absolute: 0 10*3/uL (ref 0.0–0.1)
Basophils Relative: 0 %
Eosinophils Absolute: 0.1 10*3/uL (ref 0.0–0.5)
Eosinophils Relative: 4 %
HCT: 33.3 % — ABNORMAL LOW (ref 39.0–52.0)
Hemoglobin: 10.7 g/dL — ABNORMAL LOW (ref 13.0–17.0)
Immature Granulocytes: 0 %
Lymphocytes Relative: 22 %
Lymphs Abs: 0.6 10*3/uL — ABNORMAL LOW (ref 0.7–4.0)
MCH: 29.5 pg (ref 26.0–34.0)
MCHC: 32.1 g/dL (ref 30.0–36.0)
MCV: 91.7 fL (ref 80.0–100.0)
Monocytes Absolute: 0.6 10*3/uL (ref 0.1–1.0)
Monocytes Relative: 20 %
Neutro Abs: 1.5 10*3/uL — ABNORMAL LOW (ref 1.7–7.7)
Neutrophils Relative %: 54 %
Platelets: 158 10*3/uL (ref 150–400)
RBC: 3.63 MIL/uL — ABNORMAL LOW (ref 4.22–5.81)
RDW: 14.5 % (ref 11.5–15.5)
WBC: 2.9 10*3/uL — ABNORMAL LOW (ref 4.0–10.5)
nRBC: 0 % (ref 0.0–0.2)

## 2018-07-08 LAB — CMP (CANCER CENTER ONLY)
ALT: 30 U/L (ref 0–44)
AST: 15 U/L (ref 15–41)
Albumin: 3.5 g/dL (ref 3.5–5.0)
Alkaline Phosphatase: 82 U/L (ref 38–126)
Anion gap: 9 (ref 5–15)
BUN: 14 mg/dL (ref 8–23)
CO2: 22 mmol/L (ref 22–32)
Calcium: 8.7 mg/dL — ABNORMAL LOW (ref 8.9–10.3)
Chloride: 106 mmol/L (ref 98–111)
Creatinine: 0.67 mg/dL (ref 0.61–1.24)
GFR, Est AFR Am: >60 mL/min (ref >60)
GFR, Estimated: >60 mL/min (ref >60)
Glucose, Bld: 91 mg/dL (ref 70–99)
Potassium: 4 mmol/L (ref 3.5–5.1)
Sodium: 137 mmol/L (ref 135–145)
Total Bilirubin: 0.7 mg/dL (ref 0.3–1.2)
Total Protein: 5.8 g/dL — ABNORMAL LOW (ref 6.5–8.1)

## 2018-07-08 MED ORDER — SODIUM CHLORIDE 0.9 % IV SOLN
Freq: Once | INTRAVENOUS | Status: AC
  Administered 2018-07-08: 12:00:00 via INTRAVENOUS
  Filled 2018-07-08: qty 250

## 2018-07-08 MED ORDER — SODIUM CHLORIDE 0.9 % IV SOLN
15.7000 mg/kg | Freq: Once | INTRAVENOUS | Status: AC
  Administered 2018-07-08: 900 mg via INTRAVENOUS
  Filled 2018-07-08: qty 40

## 2018-07-08 MED ORDER — ACETAMINOPHEN 325 MG PO TABS
650.0000 mg | ORAL_TABLET | Freq: Once | ORAL | Status: AC
  Administered 2018-07-08: 12:00:00 650 mg via ORAL

## 2018-07-08 MED ORDER — SODIUM CHLORIDE 0.9% FLUSH
10.0000 mL | Freq: Once | INTRAVENOUS | Status: AC | PRN
  Administered 2018-07-08: 10 mL
  Filled 2018-07-08: qty 10

## 2018-07-08 MED ORDER — HEPARIN SOD (PORK) LOCK FLUSH 100 UNIT/ML IV SOLN
500.0000 [IU] | Freq: Once | INTRAVENOUS | Status: AC | PRN
  Administered 2018-07-08: 500 [IU]
  Filled 2018-07-08: qty 5

## 2018-07-08 MED ORDER — FAMOTIDINE IN NACL 20-0.9 MG/50ML-% IV SOLN
INTRAVENOUS | Status: AC
  Filled 2018-07-08: qty 50

## 2018-07-08 MED ORDER — FAMOTIDINE IN NACL 20-0.9 MG/50ML-% IV SOLN
20.0000 mg | Freq: Once | INTRAVENOUS | Status: AC
  Administered 2018-07-08: 20 mg via INTRAVENOUS

## 2018-07-08 MED ORDER — DIPHENHYDRAMINE HCL 25 MG PO CAPS
50.0000 mg | ORAL_CAPSULE | Freq: Once | ORAL | Status: AC
  Administered 2018-07-08: 50 mg via ORAL

## 2018-07-08 MED ORDER — SODIUM CHLORIDE 0.9% FLUSH
10.0000 mL | INTRAVENOUS | Status: DC | PRN
  Filled 2018-07-08: qty 10

## 2018-07-08 MED ORDER — DIPHENHYDRAMINE HCL 25 MG PO CAPS
ORAL_CAPSULE | ORAL | Status: AC
  Filled 2018-07-08: qty 2

## 2018-07-08 MED ORDER — ACETAMINOPHEN 325 MG PO TABS
ORAL_TABLET | ORAL | Status: AC
  Filled 2018-07-08: qty 2

## 2018-07-08 MED ORDER — SODIUM CHLORIDE 0.9 % IV SOLN
20.0000 mg | Freq: Once | INTRAVENOUS | Status: AC
  Administered 2018-07-08: 20 mg via INTRAVENOUS
  Filled 2018-07-08: qty 20

## 2018-07-08 NOTE — Patient Instructions (Signed)
[facility] Discharge Instructions for Patients Receiving Chemotherapy  Today you received the following chemotherapy agents: Darzalex  To help prevent nausea and vomiting after your treatment, we encourage you to take your nausea medication as directed.    If you develop nausea and vomiting that is not controlled by your nausea medication, call the clinic.   BELOW ARE SYMPTOMS THAT SHOULD BE REPORTED IMMEDIATELY:  *FEVER GREATER THAN 100.5 F  *CHILLS WITH OR WITHOUT FEVER  NAUSEA AND VOMITING THAT IS NOT CONTROLLED WITH YOUR NAUSEA MEDICATION  *UNUSUAL SHORTNESS OF BREATH  *UNUSUAL BRUISING OR BLEEDING  TENDERNESS IN MOUTH AND THROAT WITH OR WITHOUT PRESENCE OF ULCERS  *URINARY PROBLEMS  *BOWEL PROBLEMS  UNUSUAL RASH Items with * indicate a potential emergency and should be followed up as soon as possible.  Feel free to call the clinic should you have any questions or concerns. The clinic phone number is [phone].  Please show the CHEMO ALERT CARD at check-in to the Emergency Department and triage nurse.   

## 2018-07-11 ENCOUNTER — Other Ambulatory Visit: Payer: Self-pay

## 2018-07-11 ENCOUNTER — Ambulatory Visit
Admission: RE | Admit: 2018-07-11 | Discharge: 2018-07-11 | Disposition: A | Discharge status: Home / Self Care | Payer: 59 | Source: Ambulatory Visit | Attending: Radiation Oncology | Admitting: Radiation Oncology

## 2018-07-11 DIAGNOSIS — C9000 Multiple myeloma not having achieved remission: Secondary | ICD-10-CM | POA: Diagnosis not present

## 2018-07-11 LAB — KAPPA/LAMBDA LIGHT CHAINS
Kappa free light chain: 7.3 mg/L (ref 3.3–19.4)
Kappa, lambda light chain ratio: 0.53 (ref 0.26–1.65)
Lambda free light chains: 13.8 mg/L (ref 5.7–26.3)

## 2018-07-12 ENCOUNTER — Other Ambulatory Visit: Payer: Self-pay

## 2018-07-12 ENCOUNTER — Encounter (HOSPITAL_COMMUNITY): Payer: Self-pay | Admitting: Hematology

## 2018-07-12 ENCOUNTER — Ambulatory Visit
Admission: RE | Admit: 2018-07-12 | Discharge: 2018-07-12 | Disposition: A | Discharge status: Home / Self Care | Payer: 59 | Source: Ambulatory Visit | Attending: Radiation Oncology | Admitting: Radiation Oncology

## 2018-07-12 DIAGNOSIS — C9000 Multiple myeloma not having achieved remission: Secondary | ICD-10-CM | POA: Diagnosis not present

## 2018-07-12 LAB — MULTIPLE MYELOMA PANEL, SERUM
Albumin SerPl Elph-Mcnc: 3.1 g/dL (ref 2.9–4.4)
Albumin/Glob SerPl: 1.3 (ref 0.7–1.7)
Alpha 1: 0.3 g/dL (ref 0.0–0.4)
Alpha2 Glob SerPl Elph-Mcnc: 0.8 g/dL (ref 0.4–1.0)
B-Globulin SerPl Elph-Mcnc: 0.8 g/dL (ref 0.7–1.3)
Gamma Glob SerPl Elph-Mcnc: 0.6 g/dL (ref 0.4–1.8)
Globulin, Total: 2.5 g/dL (ref 2.2–3.9)
IgA: 25 mg/dL — ABNORMAL LOW (ref 61–437)
IgG (Immunoglobin G), Serum: 673 mg/dL (ref 603–1613)
IgM (Immunoglobulin M), Srm: 36 mg/dL (ref 20–172)
M Protein SerPl Elph-Mcnc: 0.4 g/dL — ABNORMAL HIGH
Total Protein ELP: 5.6 g/dL — ABNORMAL LOW (ref 6.0–8.5)

## 2018-07-13 ENCOUNTER — Ambulatory Visit
Admission: RE | Admit: 2018-07-13 | Discharge: 2018-07-13 | Disposition: A | Discharge status: Home / Self Care | Payer: 59 | Source: Ambulatory Visit | Attending: Radiation Oncology | Admitting: Radiation Oncology

## 2018-07-13 ENCOUNTER — Other Ambulatory Visit: Payer: Self-pay | Admitting: Hematology

## 2018-07-13 ENCOUNTER — Other Ambulatory Visit: Payer: Self-pay

## 2018-07-13 DIAGNOSIS — C9000 Multiple myeloma not having achieved remission: Secondary | ICD-10-CM

## 2018-07-13 DIAGNOSIS — Z7189 Other specified counseling: Secondary | ICD-10-CM

## 2018-07-14 ENCOUNTER — Other Ambulatory Visit: Payer: Self-pay

## 2018-07-14 ENCOUNTER — Encounter: Payer: Self-pay | Admitting: Hematology

## 2018-07-14 ENCOUNTER — Ambulatory Visit
Admission: RE | Admit: 2018-07-14 | Discharge: 2018-07-14 | Disposition: A | Discharge status: Home / Self Care | Payer: 59 | Source: Ambulatory Visit | Attending: Radiation Oncology | Admitting: Radiation Oncology

## 2018-07-14 DIAGNOSIS — C9000 Multiple myeloma not having achieved remission: Secondary | ICD-10-CM | POA: Diagnosis not present

## 2018-07-14 NOTE — Progress Notes (Signed)
HEMATOLOGY/ONCOLOGY CLINIC NOTE  Date of Service: 07/15/2018  Patient Care Team: Patient, No Pcp Per as PCP - General (General Practice)  CHIEF COMPLAINTS/PURPOSE OF CONSULTATION:   Continued management of Multiple Myeloma  Oncologic History:   Jesus [name] was diagnosed with multiple myeloma in July 2018 in [place]. He initially developed mid back pain in May 2018 and was seen to have a T10 vertebral body mass with additional lytic lesion at T9. His initial M spike in July 2018 was 1.5g with IgG Lambda specificity and IgG elevated at 2219m. His July 2018 BM Bx revealed 20% monoclonal plasma cells and FISH revealed a 17p deletion. He received 4 fractions of palliative RT, however his back pain worsened and developed bilateral lower extremity numbness and tingling, and was then seen to have a T10 pathologic fracture with cord compression s/p T10 corpectomy. The pt began VRd on 09/24/16, completed 7 cycles, then [facility], post transplant complicated by neutropenic colitis and deconditioning. Repeat BM Bx on 04/21/17 revealed residual plasma cell with less than 5% lambda light chain restricted plasma cells, and a post-transplant M spike of 0.4g. The pt then began maintenance 555mm2 Carfilzomib every 2 weeks on 06/16/17.  HISTORY OF PRESENTING ILLNESS:   Jesus MCFADDENs a wonderful 63.o. male who has been referred to usKoreay Dr. BePhil Doppor evaluation and management of Multiple Myeloma. The pt reports that he is doing well overall.  The [place], the pt has moved back to his home which is here locally, and is transferring his care here now as well. The pt notes that he is anticipating C11D1 maintenance Carfilzomib today. The pt notes that his most recent M  spike was 0.2g. His last BM biopsy was March 2019, as noted above. He denies any infection issues in the last year. The pt notes that he has received all of is post-transplant vaccinations. [facility]  The pt reports that he continues to have back pain. He has been receiving Zometa every 4 weeks, but was recently transitioned to every 3 months, he denies dental concerns at this time. He is taking Calcium and Vitamin D replacement.   The pt has been taking Marinol for appetite stimulation.   The pt notes that he has an enlarged prostate, which was seen on imaging. He notes that he was expecting to receive a PSA test soon.  The pt notes that prior to his Multiple Myeloma diagnosis in July 2018, he had a "clean bill of health." He denies DM, HTN, lung problems, heart problems, thyroid problems or other medical concerns.  Most recent lab results (03/23/18) of CBC is as follows: all values are WNL except for RBC at 4.03, HGB at 11.5, HCT at 35.6%, MPV at 8.9, Glucose at 135, Total Protein at 6.2.  On review of systems, pt reports chronic back pain, stable energy levels, and denies concerns for infections, new pain along the spine, abdominal pains, leg swelling, and any other symptoms.   On PMHx the pt reports Multiple Myeloma, denies HTN, HLD, lung problems, strokes, seizures, heart problems and thyroid problems. On Social Hx the pt reports working for the [employer] development [employer] Interval History:  Jesus [name] returns today for management and evaluation of his multiple myeloma not in remission. The patient's last visit with [place] was on 06/24/18. The pt reports that he is doing well overall.  The pt was able to begin RT to right shoulder, left hip, right hip, and T12 in the interim. He also saw [facility] orthopedics and was not deemed candidate for surgery. The pt reports that he is continuing to have pain, though it is "a little better." he notes  that his right hip pain has improved markedly, and he is now able to move his left leg better. He notes that he can now open a car door with his right arm.   The pt denies any problems tolerating the 52m Pomaylst well, and is currently in his off week of his first cycle. He notes that he is tolerating Daratumumab and Dexamethasone as well.   Lab results today (07/15/18) of CBC w/diff is as follows: all values are WNL except for WBC at 1.9k, RBC at 3.88, HGB at 11.6, HCT at 35.1, ANC at 900, Lymphs abs at 400.  On review of systems, pt reports good appetite, improved pain control, stable energy levels, and denies new bone pains, concerns for infections, and any other symptoms.   MEDICAL HISTORY:  Past Medical History:  Diagnosis Date   Cancer ([facility]     SURGICAL HISTORY: Past Surgical History:  Procedure Laterality Date   BACK SURGERY     IR IMAGING GUIDED PORT INSERTION  06/21/2018    SOCIAL HISTORY: Social History   Socioeconomic History   Marital status: Married    Spouse name: Not on file   Number of children: Not on file   Years of education: Not on file   Highest education level: Not on file  Occupational History   Not on file  Social Needs   Financial resource strain: Not on file   Food insecurity    Worry: Not on file    Inability: Not on file   Transportation needs    Medical: No    Non-medical: No  Tobacco Use   Smoking status: Never Smoker   Smokeless tobacco: Never Used  Substance and Sexual Activity   Alcohol use: Never    Frequency: Never   Drug use: Never   Sexual activity: Not on file  Lifestyle   Physical activity    Days per week: Not on file    Minutes per session: Not on file   Stress: Not on file  Relationships   Social connections    Talks on phone: Not on file    Gets together: Not on file    Attends religious service: Not on file    Active member of club or organization: Not on file    Attends meetings of clubs or  organizations: Not on file    Relationship status: Not on file   Intimate partner violence    Fear of current or ex partner: Not on file    Emotionally abused: Not on file    Physically abused: Not on file    Forced sexual activity: Not on file  Other Topics Concern   Not on file  Social History Narrative   Not on file    FAMILY HISTORY: Family History  Problem Relation Age of Onset   Breast cancer Sister     ALLERGIES:  is allergic to penicillins.  MEDICATIONS:  Current Outpatient Medications  Medication Sig Dispense Refill   acetaminophen (TYLENOL) 325 MG  tablet Take by mouth.     acyclovir (ZOVIRAX) 400 MG tablet Take 1 tablet (400 mg total) by mouth 2 (two) times daily. 60 tablet 2   acyclovir (ZOVIRAX) 400 MG tablet Take 1 tablet (400 mg total) by mouth 2 (two) times daily. 60 tablet 11   aspirin 81 MG EC tablet Take by mouth.     atovaquone-proguanil (MALARONE) 250-100 MG TABS tablet TAKE 1 TABLET BY MOUTH ONCE DAILY FOR 25 DAYS BEGIN 1 2 DAYS BEFORE AND CONTINUE UNTIL 1 WEEK AFTER EXPOSURE FOR PREVENTION OF MALARIA     buPROPion (WELLBUTRIN SR) 150 MG 12 hr tablet 1 pill a day for 3 days.  Then BID     Calcium Carb-Cholecalciferol (CALCIUM 1000 + D) 1000-800 MG-UNIT TABS Take 1,000 mg by mouth once.     Cholecalciferol (VITAMIN D3) 25 MCG (1000 UT) CAPS Take by mouth.     ciprofloxacin (CIPRO) 500 MG tablet TAKE 1 TABLET BY MOUTH TWICE DAILY FOR 5 DAYS MAY TAKE FOR 3 5 DAYS FOR TRAVELERS DIARRHEA MAY DISCONTINUE WHEN SYMPTOMS RESOLVED     dexamethasone (DECADRON) 4 MG tablet Take 5 tablets (3m) with breakfast the day after each dose of Daratumumab 120 tablet 1   doxycycline (VIBRA-TABS) 100 MG tablet Take by mouth.     dronabinol (MARINOL) 10 MG capsule Take 1 capsule (10 mg total) by mouth 2 (two) times daily before a meal. 60 capsule 0   gabapentin (NEURONTIN) 300 MG capsule Take 1 capsule (300 mg total) by mouth 3 (three) times daily. 90 capsule 2    lidocaine-prilocaine (EMLA) cream Apply to affected area once 30 g 3   LORazepam (ATIVAN) 0.5 MG tablet Take by mouth.     morphine (MS CONTIN) 15 MG 12 hr tablet Take 1 tablet (15 mg total) by mouth every 12 (twelve) hours. 60 tablet 0   Multiple Vitamins-Minerals (ONE DAILY CALCIUM/IRON) TABS Take by mouth.     ondansetron (ZOFRAN) 4 MG tablet Take 1 tablet (4 mg total) by mouth every 8 (eight) hours as needed for nausea or vomiting. 30 tablet 0   ondansetron (ZOFRAN) 8 MG tablet Take 1 tablet (8 mg total) by mouth 2 (two) times daily as needed (Nausea or vomiting). 30 tablet 1   ondansetron (ZOFRAN-ODT) 4 MG disintegrating tablet Place under the tongue.     oxyCODONE (OXY IR/ROXICODONE) 5 MG immediate release tablet Take 1 tablet (5 mg total) by mouth every 4 (four) hours as needed for severe pain. 120 tablet 0   POMALYST 2 MG capsule TAKE 1 CAPSULE BY MOUTH  DAILY FOR 21 DAYS, THEN 7  DAYS OFF - TAKE WITH WATER 21 capsule 0   prochlorperazine (COMPAZINE) 10 MG tablet Take 1 tablet (10 mg total) by mouth every 6 (six) hours as needed (Nausea or vomiting). 30 tablet 1   senna-docusate (SENOKOT-S) 8.6-50 MG tablet Take by mouth.     traMADol (ULTRAM) 50 MG tablet TAKE 1 TABLET BY MOUTH EVERY 6 HOURS AS NEEDED FOR MODERATE PAIN OR SEVERE PAIN     No current facility-administered medications for this visit.     REVIEW OF SYSTEMS:    A 10+ POINT REVIEW OF SYSTEMS WAS OBTAINED including neurology, dermatology, psychiatry, cardiac, respiratory, lymph, extremities, GI, GU, Musculoskeletal, constitutional, breasts, reproductive, HEENT.  All pertinent positives are noted in the HPI.  All others are negative.   PHYSICAL EXAMINATION: ECOG PERFORMANCE STATUS: 1 - Symptomatic but completely ambulatory  Vitals:   07/15/18 0948  BP: 129/79  Pulse: 90  Resp: 18  Temp: 98.2 F (36.8 C)  SpO2: 100%   Filed Weights   07/15/18 0948  Weight: 119 lb 9.6 oz (54.3 kg)   .Body mass index is  18.46 kg/m.  GENERAL:alert, in no acute distress and comfortable SKIN: no acute rashes, no significant lesions EYES: conjunctiva are pink and non-injected, sclera anicteric OROPHARYNX: MMM, no exudates, no oropharyngeal erythema or ulceration NECK: supple, no JVD LYMPH:  no palpable lymphadenopathy in the cervical, axillary or inguinal regions LUNGS: clear to auscultation b/l with normal respiratory effort HEART: regular rate & rhythm ABDOMEN:  normoactive bowel sounds , non tender, not distended. No palpable hepatosplenomegaly.  Extremity: no pedal edema PSYCH: alert & oriented x 3 with fluent speech NEURO: no focal motor/sensory deficits   LABORATORY DATA:  I have reviewed the data as listed  . CBC Latest Ref Rng & Units 07/15/2018 07/08/2018 07/01/2018  WBC 4.0 - 10.5 K/uL 1.9(L) 2.9(L) 4.1  Hemoglobin 13.0 - 17.0 g/dL 11.6(L) 10.7(L) 11.8(L)  Hematocrit 39.0 - 52.0 % 35.1(L) 33.3(L) 35.6(L)  Platelets 150 - 400 K/uL 191 158 205    . CMP Latest Ref Rng & Units 07/15/2018 07/08/2018 07/01/2018  Glucose 70 - 99 mg/dL 127(H) 91 98  BUN 8 - 23 mg/dL '9 14 12  ' Creatinine 0.61 - 1.24 mg/dL 0.67 0.67 0.78  Sodium 135 - 145 mmol/L 136 137 137  Potassium 3.5 - 5.1 mmol/L 4.1 4.0 4.3  Chloride 98 - 111 mmol/L 105 106 103  CO2 22 - 32 mmol/L 21(L) 22 25  Calcium 8.9 - 10.3 mg/dL 9.2 8.7(L) 9.9  Total Protein 6.5 - 8.1 g/dL 6.7 5.8(L) 6.9  Total Bilirubin 0.3 - 1.2 mg/dL 0.7 0.7 0.6  Alkaline Phos 38 - 126 U/L 112 82 80  AST 15 - 41 U/L 14(L) 15 12(L)  ALT 0 - 44 U/L 36 30 16    06/30/18 BM Biopsy:    RADIOGRAPHIC STUDIES: I have personally reviewed the radiological images as listed and agreed with the findings in the report. Ct Biopsy  Result Date: 06/30/2018 INDICATION: History of multiple myeloma. Please perform CT-guided bone marrow biopsy for tissue diagnostic purposes. EXAM: CT-GUIDED BONE MARROW BIOPSY AND ASPIRATION MEDICATIONS: None ANESTHESIA/SEDATION: Fentanyl 100 mcg IV;  Versed 2 mg IV Sedation Time: 10 Minutes; The patient was continuously monitored during the procedure by the interventional radiology nurse under my direct supervision. COMPLICATIONS: None immediate. PROCEDURE: Informed consent was obtained from the patient following an explanation of the procedure, risks, benefits and alternatives. The patient understands, agrees and consents for the procedure. All questions were addressed. A time out was performed prior to the initiation of the procedure. The patient was positioned prone and non-contrast localization CT was performed of the pelvis to demonstrate the iliac marrow spaces. The operative site was prepped and draped in the usual sterile fashion. Under sterile conditions and local anesthesia, a 22 gauge spinal needle was utilized for procedural planning. Next, an 11 gauge coaxial bone biopsy needle was advanced into the left iliac marrow space. Needle position was confirmed with CT imaging. Initially, bone marrow aspiration was performed. Next, a bone marrow biopsy was obtained with the 11 gauge outer bone marrow device. Samples were prepared with the cytotechnologist and deemed adequate. The needle was removed intact. Hemostasis was obtained with compression and a dressing was placed. The patient tolerated the procedure well without immediate post procedural complication. IMPRESSION: Successful CT guided left iliac bone marrow aspiration and  core biopsy. Electronically Signed   By: Sandi Mariscal M.D.   On: 06/30/2018 10:14   Ct Bone Marrow Biopsy & Aspiration  Result Date: 06/30/2018 INDICATION: History of multiple myeloma. Please perform CT-guided bone marrow biopsy for tissue diagnostic purposes. EXAM: CT-GUIDED BONE MARROW BIOPSY AND ASPIRATION MEDICATIONS: None ANESTHESIA/SEDATION: Fentanyl 100 mcg IV; Versed 2 mg IV Sedation Time: 10 Minutes; The patient was continuously monitored during the procedure by the interventional radiology nurse under my direct  supervision. COMPLICATIONS: None immediate. PROCEDURE: Informed consent was obtained from the patient following an explanation of the procedure, risks, benefits and alternatives. The patient understands, agrees and consents for the procedure. All questions were addressed. A time out was performed prior to the initiation of the procedure. The patient was positioned prone and non-contrast localization CT was performed of the pelvis to demonstrate the iliac marrow spaces. The operative site was prepped and draped in the usual sterile fashion. Under sterile conditions and local anesthesia, a 22 gauge spinal needle was utilized for procedural planning. Next, an 11 gauge coaxial bone biopsy needle was advanced into the left iliac marrow space. Needle position was confirmed with CT imaging. Initially, bone marrow aspiration was performed. Next, a bone marrow biopsy was obtained with the 11 gauge outer bone marrow device. Samples were prepared with the cytotechnologist and deemed adequate. The needle was removed intact. Hemostasis was obtained with compression and a dressing was placed. The patient tolerated the procedure well without immediate post procedural complication. IMPRESSION: Successful CT guided left iliac bone marrow aspiration and core biopsy. Electronically Signed   By: Sandi Mariscal M.D.   On: 06/30/2018 10:14   Ir Imaging Guided Port Insertion  Result Date: 06/21/2018 CLINICAL DATA:  Port a cath placement for chemotherapy for relapsed myeloma EXAM: TUNNELED PORT CATHETER PLACEMENT WITH ULTRASOUND AND FLUOROSCOPIC GUIDANCE FLUOROSCOPY TIME:  0.2 minutes; 18  uGym2 DAP ANESTHESIA/SEDATION: Intravenous Fentanyl 158mg and Versed 440mwere administered as conscious sedation during continuous monitoring of the patient's level of consciousness and physiological / cardiorespiratory status by the radiology RN, with a total moderate sedation time of 30 minutes. TECHNIQUE: The procedure, risks, benefits, and  alternatives were explained to the patient. Questions regarding the procedure were encouraged and answered. The patient understands and consents to the procedure. As antibiotic prophylaxis, vancomycin 1 g was ordered pre-procedure and administered intravenously within one hour of incision. Patency of the right IJ vein was confirmed with ultrasound with image documentation. An appropriate skin site was determined. Skin site was marked. Region was prepped using maximum barrier technique including cap and mask, sterile gown, sterile gloves, large sterile sheet, and Chlorhexidine as cutaneous antisepsis. The region was infiltrated locally with 1% lidocaine. Under real-time ultrasound guidance, the right IJ vein was accessed with a 21 gauge micropuncture needle; the needle tip within the vein was confirmed with ultrasound image documentation. Needle was exchanged over a 018 guidewire for transitional dilator which allowed passage of the [facility] Center[facility] into the IVC. Over this, the transitional dilator was exchanged for a 5 Fr[place]PA catheter. A small incision was made on the right anterior chest wall and a subcutaneous pocket fashioned. The power-injectable port was positioned and its catheter tunneled to the right IJ dermatotomy site. The MPA catheter was exchanged over an Amplatz wire for a peel-away sheath, through which the port catheter, which had been trimmed to the appropriate length, was advanced and positioned under fluoroscopy with its tip at the cavoatrial junction. Spot chest radiograph confirms good catheter  position and no pneumothorax. The pocket was closed with deep interrupted and subcuticular continuous 3-0 Monocryl sutures. The port was flushed per protocol. The incisions were covered with Dermabond then covered with a sterile dressing. COMPLICATIONS: COMPLICATIONS None immediate IMPRESSION: Technically successful right IJ power-injectable port catheter placement. Ready for routine use. Electronically  Signed   By: Lucrezia Europe M.D.   On: 06/21/2018 13:17    ASSESSMENT & PLAN:  63 y.o. male with  1. 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 IgG at 2220. 08/11/16 PET/CT revealed Hypermetabolic large soft tissue mass in the lower thoracic paraspinal region associated with lytic destruction of T10 vertebral body, concerning for multiple myeloma. Additional smaller lytic lesions involving the skeleton. S/p RT x 4 fractions, discontinued due to T10 pathologic fracture with severe cord compression S/p 08/26/16 T10 corpectomy and T8-L1 posterior spinal fusion   Began 7 cycles of VRd on 09/24/16 S/p autologous stem cell transplant, Day 0 on 02/23/17 04/21/17 BM Bx with residual less than 5% lambda light chain restricted plasma cells in the bone marrow. M Spike at 0.4g Began maintenance 66m/m2 Carfilzomib every 2 weeks on 06/16/17  06/09/18 PET/CT revealed "Multifocal hypermetabolic lytic lesions in the skeleton as noted above, compatible with active myeloma. In the spine, the most notable active lesion of concern is at the T12 level with there is a large right eccentric vertebral body lesion with demineralization of the cortex and likely some paravertebral extension of tumor. This could cause loosening of the right pedicle screw. Intraspinal extension of tumor is difficult to exclude. MRI might be considered although the posterolateral rod and pedicle screws may cause artifact. 2. There is likewise cortical breakthrough associated with the lytic lesion along the left upper acetabulum. 3. A lesion in the left intertrochanteric area of the femur is large enough to potentially cause biomechanical weakening which increases risk of fracture. 4. Other skeletal sites of active involvement are detailed above in the skeleton section. 5.  Several lucent lesions are observed including the calvarium, T1 vertebral body, and left L4 pedicle which are not hypermetabolic and may represent effectively previously treated lesions. 6. 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 There is also a lipoma along the right brachialis muscle. 7. Nonspecific subcutaneous edema especially in the right forearm but also extending up into the right upper arm. Cause is uncertain. There is some low-grade nonfocal metabolic activity along this area. Strictly speaking, cellulitis is not excluded, correlate with clinical history and visual inspection. 8. Other imaging findings of potential clinical significance: Aortic Atherosclerosis. Mild cardiomegaly. Cholelithiasis. Prostatomegaly."  06/17/18 MMP revealed M Protein at 0.5g, just before beginning C1 Daratumumab  PLAN: -Discussed pt labwork today, 07/15/18; WBC at 1.9k with [facility]at 900. HGB improved to 11.6. -Last available MMP from 07/08/18 revealed M Protein at 0.4g -Discussed the 06/30/18 BM Biopsy which revealed normocellular marrow with minimal involvement by plasma cell neoplasm (5% plasma cells), normal cytogenetics. -Discussed the 06/30/18 Molecular Cytogenetics which did not have enough material for testing. 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 -The pt has no prohibitive toxicities from continuing C2D21 Daratumumab, 236mPomalyst,  and Dexamethasone at this time.  -Pomalyst for 3 weeks and one week off. Pt is currently in his off week and expect cytopenias to continue improving through his off week. -Given high risk myeloma with 17p mutation, would like [facility] to weigh in on option and role for a second BM transplant -Will refill Marinol today -Will now move to monthly Zometa given active disease -Continue 1-2 tablets  of 53m Oxycodone  -MS Contin in setting of cancer related pain - incurance requesting preauth -- in process -Continue RT with Rad Onc for spine tumor, hip, and shoulder -Did refer the pt to neurosurgery for evaluation of his spinal myeloma lesion to determine appropriate course for spine stabilization -Previously discussed that the PET/CT reveals disease that is disproportionate to the patient's blood tests which have most recently revealed an M spike of 0.4g on 06/06/18, and he initially had an M spike of 1.5g on initial diagnosis in July 2018, with 20% bone marrow involvement. -Stressed that pt is to avoid bending, pushing, pulling, lifting, and climbing stairs as much as possible to avoid potential fractures  -Advised that pt establish care with a PCP locally -Will check labs weekly -Will see the pt back in 2 weeks   -Please schedule remaining C2 and C3 of Daratumumab Labs with each treatment -Change Zometa to q4weeks (instead of current q12weeks) -RTC with Dr KIrene Limboin 2 weeks   All of the patients questions were answered with apparent satisfaction. The patient knows to call the clinic with any problems, questions or concerns.  The total time spent in the appt was 25 minutes and more than 50% was on counseling and direct patient cares.    GSullivan LoneMD MS AAHIVMS SCameron Memorial Community Hospital IncCGrundy County Memorial HospitalHematology/Oncology Physician [facility] (Office):       [phone](Work cell):  [phone](Fax):           [phone] 07/15/2018 10:37 AM  I, SBaldwin Jamaica am acting as a scribe for Dr. GSullivan Lone   .I have reviewed the above documentation for accuracy and completeness, and I agree with the above. .Brunetta GeneraMD

## 2018-07-14 NOTE — Progress Notes (Signed)
Met with patient whom brought income for one-time $700 [place].  Patient approved for the one-time $700 grant to assist with personal expenses while going through treatment. Gave him a copy of the approval letter as well as the expense sheet along with the Outpatient pharmacy information. He received a gas card today from his grant.  Applied for the one-time $250 stipend for Covid19 relief through [facility]. Patient approved and will receive the stipend in the mail via [place] card. Advised patient and he was very Patent attorney. Called [employer](Tina) to confirm mailing address in [place] to send card to.  Patient provided [employer] First Step card for assistance. Will share with Lenise to determine if can be used for copay assistance for treatment drug here.  He has my card for any additional financial questions or concerns.

## 2018-07-15 ENCOUNTER — Inpatient Hospital Stay (HOSPITAL_BASED_OUTPATIENT_CLINIC_OR_DEPARTMENT_OTHER): Payer: 59 | Admitting: Hematology

## 2018-07-15 ENCOUNTER — Inpatient Hospital Stay: Payer: 59

## 2018-07-15 ENCOUNTER — Ambulatory Visit
Admission: RE | Admit: 2018-07-15 | Discharge: 2018-07-15 | Disposition: A | Discharge status: Home / Self Care | Payer: 59 | Source: Ambulatory Visit | Attending: Radiation Oncology | Admitting: Radiation Oncology

## 2018-07-15 ENCOUNTER — Other Ambulatory Visit: Payer: Self-pay

## 2018-07-15 VITALS — BP 137/67 | HR 96 | Temp 98.7°F | Resp 16

## 2018-07-15 VITALS — BP 129/79 | HR 90 | Temp 98.2°F | Resp 18 | Ht 67.5 in | Wt 119.6 lb

## 2018-07-15 DIAGNOSIS — Z7189 Other specified counseling: Secondary | ICD-10-CM

## 2018-07-15 DIAGNOSIS — R630 Anorexia: Secondary | ICD-10-CM

## 2018-07-15 DIAGNOSIS — C9000 Multiple myeloma not having achieved remission: Secondary | ICD-10-CM

## 2018-07-15 DIAGNOSIS — G893 Neoplasm related pain (acute) (chronic): Secondary | ICD-10-CM | POA: Diagnosis not present

## 2018-07-15 DIAGNOSIS — Z7982 Long term (current) use of aspirin: Secondary | ICD-10-CM | POA: Diagnosis not present

## 2018-07-15 DIAGNOSIS — Z5112 Encounter for antineoplastic immunotherapy: Secondary | ICD-10-CM | POA: Diagnosis not present

## 2018-07-15 DIAGNOSIS — N400 Enlarged prostate without lower urinary tract symptoms: Secondary | ICD-10-CM | POA: Diagnosis not present

## 2018-07-15 DIAGNOSIS — Z79899 Other long term (current) drug therapy: Secondary | ICD-10-CM

## 2018-07-15 LAB — CBC WITH DIFFERENTIAL (CANCER CENTER ONLY)
Abs Immature Granulocytes: 0.01 10*3/uL (ref 0.00–0.07)
Basophils Absolute: 0 10*3/uL (ref 0.0–0.1)
Basophils Relative: 1 %
Eosinophils Absolute: 0.1 10*3/uL (ref 0.0–0.5)
Eosinophils Relative: 3 %
HCT: 35.1 % — ABNORMAL LOW (ref 39.0–52.0)
Hemoglobin: 11.6 g/dL — ABNORMAL LOW (ref 13.0–17.0)
Immature Granulocytes: 1 %
Lymphocytes Relative: 22 %
Lymphs Abs: 0.4 10*3/uL — ABNORMAL LOW (ref 0.7–4.0)
MCH: 29.9 pg (ref 26.0–34.0)
MCHC: 33 g/dL (ref 30.0–36.0)
MCV: 90.5 fL (ref 80.0–100.0)
Monocytes Absolute: 0.5 10*3/uL (ref 0.1–1.0)
Monocytes Relative: 24 %
Neutro Abs: 0.9 10*3/uL — ABNORMAL LOW (ref 1.7–7.7)
Neutrophils Relative %: 49 %
Platelet Count: 191 10*3/uL (ref 150–400)
RBC: 3.88 MIL/uL — ABNORMAL LOW (ref 4.22–5.81)
RDW: 15 % (ref 11.5–15.5)
WBC Count: 1.9 10*3/uL — ABNORMAL LOW (ref 4.0–10.5)
nRBC: 0 % (ref 0.0–0.2)

## 2018-07-15 LAB — CMP (CANCER CENTER ONLY)
ALT: 36 U/L (ref 0–44)
AST: 14 U/L — ABNORMAL LOW (ref 15–41)
Albumin: 3.4 g/dL — ABNORMAL LOW (ref 3.5–5.0)
Alkaline Phosphatase: 112 U/L (ref 38–126)
Anion gap: 10 (ref 5–15)
BUN: 9 mg/dL (ref 8–23)
CO2: 21 mmol/L — ABNORMAL LOW (ref 22–32)
Calcium: 9.2 mg/dL (ref 8.9–10.3)
Chloride: 105 mmol/L (ref 98–111)
Creatinine: 0.67 mg/dL (ref 0.61–1.24)
GFR, Est AFR Am: >60 mL/min (ref >60)
GFR, Estimated: >60 mL/min (ref >60)
Glucose, Bld: 127 mg/dL — ABNORMAL HIGH (ref 70–99)
Potassium: 4.1 mmol/L (ref 3.5–5.1)
Sodium: 136 mmol/L (ref 135–145)
Total Bilirubin: 0.7 mg/dL (ref 0.3–1.2)
Total Protein: 6.7 g/dL (ref 6.5–8.1)

## 2018-07-15 MED ORDER — SODIUM CHLORIDE 0.9 % IV SOLN
15.7000 mg/kg | Freq: Once | INTRAVENOUS | Status: AC
  Administered 2018-07-15: 900 mg via INTRAVENOUS
  Filled 2018-07-15: qty 40

## 2018-07-15 MED ORDER — SODIUM CHLORIDE 0.9% FLUSH
10.0000 mL | INTRAVENOUS | Status: DC | PRN
  Administered 2018-07-15: 10 mL
  Filled 2018-07-15: qty 10

## 2018-07-15 MED ORDER — FAMOTIDINE IN NACL 20-0.9 MG/50ML-% IV SOLN
INTRAVENOUS | Status: AC
  Filled 2018-07-15: qty 50

## 2018-07-15 MED ORDER — ZOLEDRONIC ACID 4 MG/100ML IV SOLN
4.0000 mg | Freq: Once | INTRAVENOUS | Status: AC
  Administered 2018-07-15: 13:00:00 4 mg via INTRAVENOUS
  Filled 2018-07-15: qty 100

## 2018-07-15 MED ORDER — SODIUM CHLORIDE 0.9 % IV SOLN
20.0000 mg | Freq: Once | INTRAVENOUS | Status: AC
  Administered 2018-07-15: 20 mg via INTRAVENOUS
  Filled 2018-07-15: qty 20

## 2018-07-15 MED ORDER — SODIUM CHLORIDE 0.9 % IV SOLN
Freq: Once | INTRAVENOUS | Status: AC
  Administered 2018-07-15: 12:00:00 via INTRAVENOUS
  Filled 2018-07-15: qty 250

## 2018-07-15 MED ORDER — HEPARIN SOD (PORK) LOCK FLUSH 100 UNIT/ML IV SOLN
500.0000 [IU] | Freq: Once | INTRAVENOUS | Status: AC | PRN
  Administered 2018-07-15: 500 [IU]
  Filled 2018-07-15: qty 5

## 2018-07-15 MED ORDER — ACETAMINOPHEN 325 MG PO TABS
650.0000 mg | ORAL_TABLET | Freq: Once | ORAL | Status: AC
  Administered 2018-07-15: 650 mg via ORAL

## 2018-07-15 MED ORDER — ACETAMINOPHEN 325 MG PO TABS
ORAL_TABLET | ORAL | Status: AC
  Filled 2018-07-15: qty 2

## 2018-07-15 MED ORDER — FAMOTIDINE IN NACL 20-0.9 MG/50ML-% IV SOLN
20.0000 mg | Freq: Once | INTRAVENOUS | Status: AC
  Administered 2018-07-15: 20 mg via INTRAVENOUS

## 2018-07-15 MED ORDER — SODIUM CHLORIDE 0.9% FLUSH
10.0000 mL | Freq: Once | INTRAVENOUS | Status: AC | PRN
  Administered 2018-07-15: 10 mL
  Filled 2018-07-15: qty 10

## 2018-07-15 MED ORDER — DIPHENHYDRAMINE HCL 25 MG PO CAPS
50.0000 mg | ORAL_CAPSULE | Freq: Once | ORAL | Status: AC
  Administered 2018-07-15: 12:00:00 50 mg via ORAL

## 2018-07-15 MED ORDER — DRONABINOL 10 MG PO CAPS: 10.0000 mg | ORAL_CAPSULE | Freq: Two times a day (BID) | ORAL | 0 refills | Status: DC

## 2018-07-15 MED ORDER — DIPHENHYDRAMINE HCL 25 MG PO CAPS
ORAL_CAPSULE | ORAL | Status: AC
  Filled 2018-07-15: qty 2

## 2018-07-15 NOTE — Progress Notes (Signed)
Per Dr. Irene Limbo- OK to treat today with ANC 0.9

## 2018-07-15 NOTE — Patient Instructions (Signed)
[facility] Discharge Instructions for Patients Receiving Chemotherapy  Today you received the following chemotherapy agents: Darzalex  To help prevent nausea and vomiting after your treatment, we encourage you to take your nausea medication as directed   If you develop nausea and vomiting that is not controlled by your nausea medication, call the clinic.   BELOW ARE SYMPTOMS THAT SHOULD BE REPORTED IMMEDIATELY:  *FEVER GREATER THAN 100.5 F  *CHILLS WITH OR WITHOUT FEVER  NAUSEA AND VOMITING THAT IS NOT CONTROLLED WITH YOUR NAUSEA MEDICATION  *UNUSUAL SHORTNESS OF BREATH  *UNUSUAL BRUISING OR BLEEDING  TENDERNESS IN MOUTH AND THROAT WITH OR WITHOUT PRESENCE OF ULCERS  *URINARY PROBLEMS  *BOWEL PROBLEMS  UNUSUAL RASH Items with * indicate a potential emergency and should be followed up as soon as possible.  Feel free to call the clinic should you have any questions or concerns. The clinic phone number is [phone].  Please show the Champ at check-in to the Emergency Department and triage nurse.  Zoledronic Acid injection (Hypercalcemia, Oncology) What is this medicine? ZOLEDRONIC ACID (ZOE le dron ik AS id) lowers the amount of calcium loss from bone. It is used to treat too much calcium in your blood from cancer. It is also used to prevent complications of cancer that has spread to the bone. This medicine may be used for other purposes; ask your health care provider or pharmacist if you have questions. COMMON BRAND NAME(S): Zometa What should I tell my health care provider before I take this medicine? They need to know if you have any of these conditions: -aspirin-sensitive asthma -cancer, especially if you are receiving medicines used to treat cancer -dental disease or wear dentures -infection -kidney disease -receiving corticosteroids like dexamethasone or prednisone -an unusual or allergic reaction to zoledronic acid, other medicines,  foods, dyes, or preservatives -pregnant or trying to get pregnant -breast-feeding How should I use this medicine? This medicine is for infusion into a vein. It is given by a health care professional in a hospital or clinic setting. Talk to your pediatrician regarding the use of this medicine in children. Special care may be needed. Overdosage: If you think you have taken too much of this medicine contact a poison control center or emergency room at once. NOTE: This medicine is only for you. Do not share this medicine with others. What if I miss a dose? It is important not to miss your dose. Call your doctor or health care professional if you are unable to keep an appointment. What may interact with this medicine? -certain antibiotics given by injection -NSAIDs, medicines for pain and inflammation, like ibuprofen or naproxen -some diuretics like bumetanide, furosemide -teriparatide -thalidomide This list may not describe all possible interactions. Give your health care provider a list of all the medicines, herbs, non-prescription drugs, or dietary supplements you use. Also tell them if you smoke, drink alcohol, or use illegal drugs. Some items may interact with your medicine. What should I watch for while using this medicine? Visit your doctor or health care professional for regular checkups. It may be some time before you see the benefit from this medicine. Do not stop taking your medicine unless your doctor tells you to. Your doctor may order blood tests or other tests to see how you are doing. Women should inform their doctor if they wish to become pregnant or think they might be pregnant. There is a potential for serious side effects to an unborn child. Talk to  your health care professional or pharmacist for more information. You should make sure that you get enough calcium and vitamin D while you are taking this medicine. Discuss the foods you eat and the vitamins you take with your health  care professional. Some people who take this medicine have severe bone, joint, and/or muscle pain. This medicine may also increase your risk for jaw problems or a broken thigh bone. Tell your doctor right away if you have severe pain in your jaw, bones, joints, or muscles. Tell your doctor if you have any pain that does not go away or that gets worse. Tell your dentist and dental surgeon that you are taking this medicine. You should not have major dental surgery while on this medicine. See your dentist to have a dental exam and fix any dental problems before starting this medicine. Take good care of your teeth while on this medicine. Make sure you see your dentist for regular follow-up appointments. What side effects may I notice from receiving this medicine? Side effects that you should report to your doctor or health care professional as soon as possible: -allergic reactions like skin rash, itching or hives, swelling of the face, lips, or tongue -anxiety, confusion, or depression -breathing problems -changes in vision -eye pain -feeling faint or lightheaded, falls -jaw pain, especially after dental work -mouth sores -muscle cramps, stiffness, or weakness -redness, blistering, peeling or loosening of the skin, including inside the mouth -trouble passing urine or change in the amount of urine Side effects that usually do not require medical attention (report to your doctor or health care professional if they continue or are bothersome): -bone, joint, or muscle pain -constipation -diarrhea -fever -hair loss -irritation at site where injected -loss of appetite -nausea, vomiting -stomach upset -trouble sleeping -trouble swallowing -weak or tired This list may not describe all possible side effects. Call your doctor for medical advice about side effects. You may report side effects to FDA at 1-800-FDA-1088. Where should I keep my medicine? This drug is given in a hospital or clinic and  will not be stored at home. NOTE: This sheet is a summary. It may not cover all possible information. If you have questions about this medicine, talk to your doctor, pharmacist, or health care provider.  2019 [employer]vier/Gold Standard (2013-06-17 14:19:39)

## 2018-07-18 ENCOUNTER — Ambulatory Visit: Payer: 59 | Admitting: Hematology

## 2018-07-18 ENCOUNTER — Ambulatory Visit: Payer: 59

## 2018-07-18 ENCOUNTER — Other Ambulatory Visit: Payer: 59

## 2018-07-18 ENCOUNTER — Other Ambulatory Visit: Payer: Self-pay | Admitting: *Deleted

## 2018-07-18 ENCOUNTER — Other Ambulatory Visit: Payer: Self-pay

## 2018-07-18 ENCOUNTER — Telehealth: Payer: Self-pay | Admitting: Hematology

## 2018-07-18 ENCOUNTER — Telehealth: Payer: Self-pay | Admitting: *Deleted

## 2018-07-18 ENCOUNTER — Ambulatory Visit
Admission: RE | Admit: 2018-07-18 | Discharge: 2018-07-18 | Disposition: A | Discharge status: Home / Self Care | Payer: 59 | Source: Ambulatory Visit | Attending: Radiation Oncology | Admitting: Radiation Oncology

## 2018-07-18 DIAGNOSIS — Z7189 Other specified counseling: Secondary | ICD-10-CM

## 2018-07-18 DIAGNOSIS — C9000 Multiple myeloma not having achieved remission: Secondary | ICD-10-CM | POA: Diagnosis not present

## 2018-07-18 MED ORDER — POMALIDOMIDE 2 MG PO CAPS: ORAL_CAPSULE | ORAL | 0 refills | Status: DC

## 2018-07-18 NOTE — Telephone Encounter (Signed)
Contacted [facility], Hematology and Oncology to initiate referral requested to Dr. Norma Fredrickson by Dr.Kale for Myeloma with [employer]mutation. Spoke with Kennyth Lose, gave demographics and reason for referral. They will contact patient. Will fax last 2 OV notes and recent labs and scan reports.

## 2018-07-18 NOTE — Telephone Encounter (Signed)
Pomalyst refill per OV note 07/15/2018. Refill sent to Pharmacy: [facility]) [place], [place] #[id number], 07/18/2018

## 2018-07-18 NOTE — Telephone Encounter (Signed)
Scheduled appt per 6/12 los. °

## 2018-07-19 ENCOUNTER — Other Ambulatory Visit: Payer: Self-pay

## 2018-07-19 ENCOUNTER — Ambulatory Visit
Admission: RE | Admit: 2018-07-19 | Discharge: 2018-07-19 | Disposition: A | Discharge status: Home / Self Care | Payer: 59 | Source: Ambulatory Visit | Attending: Radiation Oncology | Admitting: Radiation Oncology

## 2018-07-19 DIAGNOSIS — C9000 Multiple myeloma not having achieved remission: Secondary | ICD-10-CM | POA: Diagnosis not present

## 2018-07-19 NOTE — Telephone Encounter (Signed)
Faxed all requested information to [facility] Heme/Onc office fax [phone]. (office# [phone])

## 2018-07-19 NOTE — Telephone Encounter (Signed)
Fax confirmation received. 

## 2018-07-20 ENCOUNTER — Telehealth: Payer: Self-pay | Admitting: *Deleted

## 2018-07-20 ENCOUNTER — Other Ambulatory Visit: Payer: Self-pay

## 2018-07-20 ENCOUNTER — Ambulatory Visit: Payer: 59

## 2018-07-20 ENCOUNTER — Ambulatory Visit
Admission: RE | Admit: 2018-07-20 | Discharge: 2018-07-20 | Disposition: A | Discharge status: Home / Self Care | Payer: 59 | Source: Ambulatory Visit | Attending: Radiation Oncology | Admitting: Radiation Oncology

## 2018-07-20 ENCOUNTER — Other Ambulatory Visit: Payer: 59

## 2018-07-20 DIAGNOSIS — C9000 Multiple myeloma not having achieved remission: Secondary | ICD-10-CM | POA: Diagnosis not present

## 2018-07-20 NOTE — Telephone Encounter (Signed)
Opened in error

## 2018-07-21 ENCOUNTER — Other Ambulatory Visit: Payer: Self-pay

## 2018-07-21 ENCOUNTER — Telehealth: Payer: Self-pay | Admitting: *Deleted

## 2018-07-21 ENCOUNTER — Ambulatory Visit
Admission: RE | Admit: 2018-07-21 | Discharge: 2018-07-21 | Disposition: A | Discharge status: Home / Self Care | Payer: 59 | Source: Ambulatory Visit | Attending: Radiation Oncology | Admitting: Radiation Oncology

## 2018-07-21 DIAGNOSIS — C9000 Multiple myeloma not having achieved remission: Secondary | ICD-10-CM | POA: Diagnosis not present

## 2018-07-21 NOTE — Telephone Encounter (Signed)
Contacted by Oliver Hum with [facility] [phone] to confirm patient's current treatment schedule. He had given her dates - she shared them and asked to have them confirmed. She asked if patient might receive more chemotherapy after that and was informed that it was a decision made by Dr.Kale.

## 2018-07-21 NOTE — Telephone Encounter (Signed)
Records faxed to [facility] Oncology - Release: [id number]

## 2018-07-22 ENCOUNTER — Ambulatory Visit
Admission: RE | Admit: 2018-07-22 | Discharge: 2018-07-22 | Disposition: A | Discharge status: Home / Self Care | Payer: 59 | Source: Ambulatory Visit | Attending: Radiation Oncology | Admitting: Radiation Oncology

## 2018-07-22 ENCOUNTER — Inpatient Hospital Stay: Payer: 59

## 2018-07-22 ENCOUNTER — Other Ambulatory Visit: Payer: Self-pay

## 2018-07-22 ENCOUNTER — Other Ambulatory Visit: Payer: Self-pay | Admitting: Radiation Oncology

## 2018-07-22 ENCOUNTER — Encounter: Payer: Self-pay | Admitting: Radiation Oncology

## 2018-07-22 VITALS — BP 129/75 | HR 82 | Temp 98.6°F | Resp 16

## 2018-07-22 DIAGNOSIS — Z7189 Other specified counseling: Secondary | ICD-10-CM

## 2018-07-22 DIAGNOSIS — Z95828 Presence of other vascular implants and grafts: Secondary | ICD-10-CM

## 2018-07-22 DIAGNOSIS — C9000 Multiple myeloma not having achieved remission: Secondary | ICD-10-CM

## 2018-07-22 DIAGNOSIS — Z5112 Encounter for antineoplastic immunotherapy: Secondary | ICD-10-CM | POA: Diagnosis not present

## 2018-07-22 LAB — CBC WITH DIFFERENTIAL (CANCER CENTER ONLY)
Abs Immature Granulocytes: 0.03 10*3/uL (ref 0.00–0.07)
Basophils Absolute: 0 10*3/uL (ref 0.0–0.1)
Basophils Relative: 1 %
Eosinophils Absolute: 0.1 10*3/uL (ref 0.0–0.5)
Eosinophils Relative: 3 %
HCT: 32.6 % — ABNORMAL LOW (ref 39.0–52.0)
Hemoglobin: 10.6 g/dL — ABNORMAL LOW (ref 13.0–17.0)
Immature Granulocytes: 1 %
Lymphocytes Relative: 19 %
Lymphs Abs: 0.4 10*3/uL — ABNORMAL LOW (ref 0.7–4.0)
MCH: 30.1 pg (ref 26.0–34.0)
MCHC: 32.5 g/dL (ref 30.0–36.0)
MCV: 92.6 fL (ref 80.0–100.0)
Monocytes Absolute: 0.6 10*3/uL (ref 0.1–1.0)
Monocytes Relative: 27 %
Neutro Abs: 1.1 10*3/uL — ABNORMAL LOW (ref 1.7–7.7)
Neutrophils Relative %: 49 %
Platelet Count: 174 10*3/uL (ref 150–400)
RBC: 3.52 MIL/uL — ABNORMAL LOW (ref 4.22–5.81)
RDW: 15.9 % — ABNORMAL HIGH (ref 11.5–15.5)
WBC Count: 2.2 10*3/uL — ABNORMAL LOW (ref 4.0–10.5)
nRBC: 0 % (ref 0.0–0.2)

## 2018-07-22 LAB — CMP (CANCER CENTER ONLY)
ALT: 18 U/L (ref 0–44)
AST: 12 U/L — ABNORMAL LOW (ref 15–41)
Albumin: 3.4 g/dL — ABNORMAL LOW (ref 3.5–5.0)
Alkaline Phosphatase: 87 U/L (ref 38–126)
Anion gap: 6 (ref 5–15)
BUN: 10 mg/dL (ref 8–23)
CO2: 25 mmol/L (ref 22–32)
Calcium: 9.1 mg/dL (ref 8.9–10.3)
Chloride: 106 mmol/L (ref 98–111)
Creatinine: 0.65 mg/dL (ref 0.61–1.24)
GFR, Est AFR Am: >60 mL/min (ref >60)
GFR, Estimated: >60 mL/min (ref >60)
Glucose, Bld: 91 mg/dL (ref 70–99)
Potassium: 4.3 mmol/L (ref 3.5–5.1)
Sodium: 137 mmol/L (ref 135–145)
Total Bilirubin: 0.5 mg/dL (ref 0.3–1.2)
Total Protein: 6.2 g/dL — ABNORMAL LOW (ref 6.5–8.1)

## 2018-07-22 MED ORDER — DIPHENHYDRAMINE HCL 25 MG PO CAPS
ORAL_CAPSULE | ORAL | Status: AC
  Filled 2018-07-22: qty 2

## 2018-07-22 MED ORDER — SODIUM CHLORIDE 0.9% FLUSH
10.0000 mL | INTRAVENOUS | Status: DC | PRN
  Administered 2018-07-22: 10 mL via INTRAVENOUS
  Filled 2018-07-22: qty 10

## 2018-07-22 MED ORDER — SODIUM CHLORIDE 0.9 % IV SOLN
15.7000 mg/kg | Freq: Once | INTRAVENOUS | Status: AC
  Administered 2018-07-22: 13:00:00 900 mg via INTRAVENOUS
  Filled 2018-07-22: qty 40

## 2018-07-22 MED ORDER — HEPARIN SOD (PORK) LOCK FLUSH 100 UNIT/ML IV SOLN
500.0000 [IU] | Freq: Once | INTRAVENOUS | Status: AC | PRN
  Administered 2018-07-22: 500 [IU]
  Filled 2018-07-22: qty 5

## 2018-07-22 MED ORDER — SODIUM CHLORIDE 0.9 % IV SOLN
Freq: Once | INTRAVENOUS | Status: AC
  Administered 2018-07-22: 11:00:00 via INTRAVENOUS
  Filled 2018-07-22: qty 250

## 2018-07-22 MED ORDER — ACETAMINOPHEN 325 MG PO TABS
650.0000 mg | ORAL_TABLET | Freq: Once | ORAL | Status: AC
  Administered 2018-07-22: 11:00:00 650 mg via ORAL

## 2018-07-22 MED ORDER — ACETAMINOPHEN 325 MG PO TABS
ORAL_TABLET | ORAL | Status: AC
  Filled 2018-07-22: qty 2

## 2018-07-22 MED ORDER — SODIUM CHLORIDE 0.9% FLUSH
10.0000 mL | Freq: Once | INTRAVENOUS | Status: DC
  Filled 2018-07-22: qty 10

## 2018-07-22 MED ORDER — SUCRALFATE 1 G PO TABS: 1.0000 g | ORAL_TABLET | Freq: Four times a day (QID) | ORAL | 0 refills | Status: DC

## 2018-07-22 MED ORDER — FAMOTIDINE IN NACL 20-0.9 MG/50ML-% IV SOLN
20.0000 mg | Freq: Once | INTRAVENOUS | Status: AC
  Administered 2018-07-22: 20 mg via INTRAVENOUS

## 2018-07-22 MED ORDER — FAMOTIDINE IN NACL 20-0.9 MG/50ML-% IV SOLN
INTRAVENOUS | Status: AC
  Filled 2018-07-22: qty 50

## 2018-07-22 MED ORDER — SODIUM CHLORIDE 0.9 % IV SOLN
20.0000 mg | Freq: Once | INTRAVENOUS | Status: AC
  Administered 2018-07-22: 11:00:00 20 mg via INTRAVENOUS
  Filled 2018-07-22: qty 20

## 2018-07-22 MED ORDER — DIPHENHYDRAMINE HCL 25 MG PO CAPS
50.0000 mg | ORAL_CAPSULE | Freq: Once | ORAL | Status: AC
  Administered 2018-07-22: 50 mg via ORAL

## 2018-07-22 MED ORDER — SODIUM CHLORIDE 0.9% FLUSH
10.0000 mL | INTRAVENOUS | Status: DC | PRN
  Administered 2018-07-22: 10 mL
  Filled 2018-07-22: qty 10

## 2018-07-22 NOTE — Patient Instructions (Signed)
[facility] Discharge Instructions for Patients Receiving Chemotherapy  Today you received the following chemotherapy agents: Darzalex  To help prevent nausea and vomiting after your treatment, we encourage you to take your nausea medication as directed.    If you develop nausea and vomiting that is not controlled by your nausea medication, call the clinic.   BELOW ARE SYMPTOMS THAT SHOULD BE REPORTED IMMEDIATELY:  *FEVER GREATER THAN 100.5 F  *CHILLS WITH OR WITHOUT FEVER  NAUSEA AND VOMITING THAT IS NOT CONTROLLED WITH YOUR NAUSEA MEDICATION  *UNUSUAL SHORTNESS OF BREATH  *UNUSUAL BRUISING OR BLEEDING  TENDERNESS IN MOUTH AND THROAT WITH OR WITHOUT PRESENCE OF ULCERS  *URINARY PROBLEMS  *BOWEL PROBLEMS  UNUSUAL RASH Items with * indicate a potential emergency and should be followed up as soon as possible.  Feel free to call the clinic should you have any questions or concerns. The clinic phone number is [phone].  Please show the CHEMO ALERT CARD at check-in to the Emergency Department and triage nurse.   

## 2018-07-22 NOTE — Progress Notes (Signed)
Per Dr. Irene Limbo it is ok to treat with Darzalex today with ANC 1.1

## 2018-07-28 NOTE — Progress Notes (Signed)
° ° °HEMATOLOGY/ONCOLOGY CLINIC NOTE ° °Date of Service: 07/29/2018 ° °Patient Care Team: °Patient, No Pcp Per as PCP - General (General Practice) ° °CHIEF COMPLAINTS/PURPOSE OF CONSULTATION:  ° °Continued management of Multiple Myeloma ° °Oncologic History:  ° °Jesus [name] was diagnosed with multiple myeloma in July 2018 in [place]. He initially developed mid back pain in May 2018 and was seen to have a T10 vertebral body mass with additional lytic lesion at T9. His initial M spike in July 2018 was 1.5g with IgG Lambda specificity and IgG elevated at 2220mg. His July 2018 BM Bx revealed 20% monoclonal plasma cells and FISH revealed a 17p deletion. He received 4 fractions of palliative RT, however his back pain worsened and developed bilateral lower extremity numbness and tingling, and was then seen to have a T10 pathologic fracture with cord compression s/p T10 corpectomy. The pt began VRd on 09/24/16, completed 7 cycles, then began autologous transplant at the [facility] with Day 0 on 02/23/17, post transplant complicated by neutropenic colitis and deconditioning. Repeat BM Bx on 04/21/17 revealed residual plasma cell with less than 5% lambda light chain restricted plasma cells, and a post-transplant M spike of 0.4g. The pt then began maintenance 56mg/m2 Carfilzomib every 2 weeks on 06/16/17. ° °HISTORY OF PRESENTING ILLNESS:  ° °Jesus [name] is a wonderful 62 y.o. male who has been referred to us by Dr. Ben Zhang for evaluation and management of Multiple Myeloma. The pt reports that he is doing well overall. ° °The pt has been receiving all of his care and treatment thus far in [place]. 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 The pt notes that he is anticipating C11D1 maintenance Carfilzomib today. The pt notes that his most recent M  spike was 0.2g. His last BM biopsy was March 2019, as noted above. He denies any infection issues in the last year. The pt notes that he has received all of is post-transplant vaccinations. He last saw his transplant center, the [facility], in December 2019. ° °The pt reports that he continues to have back pain. He has been receiving Zometa every 4 weeks, but was recently transitioned to every 3 months, he denies dental concerns at this time. He is taking Calcium and Vitamin D replacement.  ° °The pt has been taking Marinol for appetite stimulation.  ° °The pt notes that he has an enlarged prostate, which was seen on imaging. He notes that he was expecting to receive a PSA test soon. ° °The pt notes that prior to his Multiple Myeloma diagnosis in July 2018, he had a "clean bill of health." He denies DM, HTN, lung problems, heart problems, thyroid problems or other medical concerns. ° °Most recent lab results (03/23/18) of CBC is as follows: all values are WNL except for RBC at 4.03, HGB at 11.5, HCT at 35.6%, MPV at 8.9, Glucose at 135, Total Protein at 6.2. ° °On review of systems, pt reports chronic back pain, stable energy levels, and denies concerns for infections, new pain along the spine, abdominal pains, leg swelling, and any other symptoms.  ° °On PMHx the pt reports Multiple Myeloma, denies HTN, HLD, lung problems, strokes, seizures, heart problems and thyroid problems. 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 ° °Interval History:  ° °  Jesus [name] returns today for management and evaluation of his multiple myeloma not in remission. The patient's last visit with [place] was on 07/15/18. The pt reports that he is doing well overall.  The pt reports that his shoulder pain and right hip pains are both improved overall. His left groin continues to be painful while walking, now okay with just sitting. He has completed RT one week ago on 07/22/18. He notes that he is not needing to take his  short acting pain medications, and is instead using Tylenol.   Thus far, the pt denies any problems tolerating Daratumumab and Pomalyst. He denies skin rashes or diarrhea. He notes that he continues eating well and notes that his energy levels are doing okay. At this point he notes that he is not having much back pain.  The pt will see Dr. Blinda Leatherwood at [facility] on 08/22/18 for consideration of a second transplant.  Lab results today (07/29/18) of CBC w/diff and CMP is as follows: all values are WNL except for WBC at 2.9k, RBC at 3.57, HGB at 10.6, HCT at 32.3, RDW at 15.8, PLT at 145k, Lymphs abs at 500, Potassium at 3.4, Glucose at 127, Calcium at 8.8, Total Protein at 6.3, Albumin at 3.4, AST at 13.  On review of systems, pt reports improved should and right hip pain, okay energy levels, eating well, and denies skin rashes, diarrhea, fatigue, abdominal pains, leg swelling, back pain, and any other symptoms.    MEDICAL HISTORY:  Past Medical History:  Diagnosis Date   Cancer [facility])     SURGICAL HISTORY: Past Surgical History:  Procedure Laterality Date   BACK SURGERY     IR IMAGING GUIDED PORT INSERTION  06/21/2018    SOCIAL HISTORY: Social History   Socioeconomic History   Marital status: Married    Spouse name: Not on file   Number of children: Not on file   Years of education: Not on file   Highest education level: Not on file  Occupational History   Not on file  Social Needs   Financial resource strain: Not on file   Food insecurity    Worry: Not on file    Inability: Not on file   Transportation needs    Medical: No    Non-medical: No  Tobacco Use   Smoking status: Never Smoker   Smokeless tobacco: Never Used  Substance and Sexual Activity   Alcohol use: Never    Frequency: Never   Drug use: Never   Sexual activity: Not on file  Lifestyle   Physical activity    Days per week: Not on file    Minutes per session: Not on file   Stress: Not on  file  Relationships   Social connections    Talks on phone: Not on file    Gets together: Not on file    Attends religious service: Not on file    Active member of club or organization: Not on file    Attends meetings of clubs or organizations: Not on file    Relationship status: Not on file   Intimate partner violence    Fear of current or ex partner: Not on file    Emotionally abused: Not on file    Physically abused: Not on file    Forced sexual activity: Not on file  Other Topics Concern   Not on file  Social History Narrative   Not on file    FAMILY HISTORY: Family History  Problem Relation Age of Onset   Breast cancer Sister     ALLERGIES:  is allergic to penicillins.  MEDICATIONS:  Current Outpatient Medications  Medication Sig Dispense Refill   acetaminophen (TYLENOL) 325 MG tablet Take by mouth.     acyclovir (ZOVIRAX) 400 MG tablet Take 1 tablet (400 mg total) by mouth 2 (two) times daily. 60 tablet 2   acyclovir (ZOVIRAX) 400 MG tablet Take 1 tablet (400 mg total) by mouth 2 (two) times daily. 60 tablet 11   aspirin 81 MG EC tablet Take by mouth.     atovaquone-proguanil (MALARONE) 250-100 MG TABS tablet TAKE 1 TABLET BY MOUTH ONCE DAILY FOR 25 DAYS BEGIN 1 2 DAYS BEFORE AND CONTINUE UNTIL 1 WEEK AFTER EXPOSURE FOR PREVENTION OF MALARIA     buPROPion (WELLBUTRIN SR) 150 MG 12 hr tablet 1 pill a day for 3 days.  Then BID     Calcium Carb-Cholecalciferol (CALCIUM 1000 + D) 1000-800 MG-UNIT TABS Take 1,000 mg by mouth once.     Cholecalciferol (VITAMIN D3) 25 MCG (1000 UT) CAPS Take by mouth.     ciprofloxacin (CIPRO) 500 MG tablet TAKE 1 TABLET BY MOUTH TWICE DAILY FOR 5 DAYS MAY TAKE FOR 3 5 DAYS FOR TRAVELERS DIARRHEA MAY DISCONTINUE WHEN SYMPTOMS RESOLVED     dexamethasone (DECADRON) 4 MG tablet Take 5 tablets (52m) with breakfast the day after each dose of Daratumumab 120 tablet 1   doxycycline (VIBRA-TABS) 100 MG tablet Take by mouth.      dronabinol (MARINOL) 10 MG capsule Take 1 capsule (10 mg total) by mouth 2 (two) times daily before a meal. 60 capsule 0   gabapentin (NEURONTIN) 300 MG capsule Take 1 capsule (300 mg total) by mouth 3 (three) times daily. 90 capsule 2   lidocaine-prilocaine (EMLA) cream Apply to affected area once 30 g 3   LORazepam (ATIVAN) 0.5 MG tablet Take by mouth.     morphine (MS CONTIN) 15 MG 12 hr tablet Take 1 tablet (15 mg total) by mouth every 12 (twelve) hours. 60 tablet 0   Multiple Vitamins-Minerals (ONE DAILY CALCIUM/IRON) TABS Take by mouth.     ondansetron (ZOFRAN) 4 MG tablet Take 1 tablet (4 mg total) by mouth every 8 (eight) hours as needed for nausea or vomiting. 30 tablet 0   ondansetron (ZOFRAN) 8 MG tablet Take 1 tablet (8 mg total) by mouth 2 (two) times daily as needed (Nausea or vomiting). 30 tablet 1   ondansetron (ZOFRAN-ODT) 4 MG disintegrating tablet Place under the tongue.     oxyCODONE (OXY IR/ROXICODONE) 5 MG immediate release tablet Take 1 tablet (5 mg total) by mouth every 4 (four) hours as needed for severe pain. 120 tablet 0   pomalidomide (POMALYST) 2 MG capsule TAKE 1 CAPSULE BY MOUTH  DAILY FOR 21 DAYS, THEN 7  DAYS OFF - TAKE WITH WATER 21 capsule 0   prochlorperazine (COMPAZINE) 10 MG tablet Take 1 tablet (10 mg total) by mouth every 6 (six) hours as needed (Nausea or vomiting). 30 tablet 1   senna-docusate (SENOKOT-S) 8.6-50 MG tablet Take by mouth.     sucralfate (CARAFATE) 1 g tablet Take 1 tablet (1 g total) by mouth 4 (four) times daily. 120 tablet 0   traMADol (ULTRAM) 50 MG tablet TAKE 1 TABLET BY MOUTH EVERY 6 HOURS AS NEEDED FOR MODERATE PAIN OR SEVERE PAIN     No current facility-administered medications for this visit.     REVIEW OF SYSTEMS:  A 10+ POINT REVIEW OF SYSTEMS WAS OBTAINED including neurology, dermatology, psychiatry, cardiac, respiratory, lymph, extremities, GI, GU, Musculoskeletal, constitutional, breasts, reproductive, HEENT.   All pertinent positives are noted in the HPI.  All others are negative.  ° °PHYSICAL EXAMINATION: °ECOG PERFORMANCE STATUS: 1 - Symptomatic but completely ambulatory ° °Vitals:  ° 07/29/18 0939  °BP: 130/68  °Pulse: 92  °Resp: 18  °Temp: 98.9 °F (37.2 °C)  °SpO2: 100%  ° °Filed Weights  ° 07/29/18 0939  °Weight: 120 lb 1.6 oz (54.5 kg)  ° °.Body mass index is 18.53 kg/m². ° °GENERAL:alert, in no acute distress and comfortable °SKIN: no acute rashes, no significant lesions °EYES: conjunctiva are pink and non-injected, sclera anicteric °OROPHARYNX: MMM, no exudates, no oropharyngeal erythema or ulceration °NECK: supple, no JVD °LYMPH:  no palpable lymphadenopathy in the cervical, axillary or inguinal regions °LUNGS: clear to auscultation b/l with normal respiratory effort °HEART: regular rate & rhythm °ABDOMEN:  normoactive bowel sounds , non tender, not distended. No palpable hepatosplenomegaly.  °Extremity: no pedal edema °PSYCH: alert & oriented x 3 with fluent speech °NEURO: no focal motor/sensory deficits  ° °LABORATORY DATA:  °I have reviewed the data as listed ° °. °CBC Latest Ref Rng & Units 07/29/2018 07/22/2018 07/15/2018  °WBC 4.0 - 10.5 K/uL 2.9(L) 2.2(L) 1.9(L)  °Hemoglobin 13.0 - 17.0 g/dL 10.6(L) 10.6(L) 11.6(L)  °Hematocrit 39.0 - 52.0 % 32.3(L) 32.6(L) 35.1(L)  °Platelets 150 - 400 K/uL 145(L) 174 191  ° ° °. °CMP Latest Ref Rng & Units 07/29/2018 07/22/2018 07/15/2018  °Glucose 70 - 99 mg/dL 127(H) 91 127(H)  °BUN 8 - 23 mg/dL 12 10 9  °Creatinine 0.61 - 1.24 mg/dL 0.70 0.65 0.67  °Sodium 135 - 145 mmol/L 139 137 136  °Potassium 3.5 - 5.1 mmol/L 3.4(L) 4.3 4.1  °Chloride 98 - 111 mmol/L 107 106 105  °CO2 22 - 32 mmol/L 23 25 21(L)  °Calcium 8.9 - 10.3 mg/dL 8.8(L) 9.1 9.2  °Total Protein 6.5 - 8.1 g/dL 6.3(L) 6.2(L) 6.7  °Total Bilirubin 0.3 - 1.2 mg/dL 0.5 0.5 0.7  °Alkaline Phos 38 - 126 U/L 87 87 112  °AST 15 - 41 U/L 13(L) 12(L) 14(L)  °ALT 0 - 44 U/L 23 18 36  ° ° °06/30/18 BM  Biopsy: ° ° ° °RADIOGRAPHIC STUDIES: °I have personally reviewed the radiological images as listed and agreed with the findings in the report. °Ct Biopsy ° °Result Date: 06/30/2018 °INDICATION: History of multiple myeloma. Please perform CT-guided bone marrow biopsy for tissue diagnostic purposes. EXAM: CT-GUIDED BONE MARROW BIOPSY AND ASPIRATION MEDICATIONS: None ANESTHESIA/SEDATION: Fentanyl 100 mcg IV; Versed 2 mg IV Sedation Time: 10 Minutes; The patient was continuously monitored during the procedure by the interventional radiology nurse under my direct supervision. COMPLICATIONS: None immediate. PROCEDURE: Informed consent was obtained from the patient following an explanation of the procedure, risks, benefits and alternatives. The patient understands, agrees and consents for the procedure. All questions were addressed. A time out was performed prior to the initiation of the procedure. The patient was positioned prone and non-contrast localization CT was performed of the pelvis to demonstrate the iliac marrow spaces. The operative site was prepped and draped in the usual sterile fashion. Under sterile conditions and local anesthesia, a 22 gauge spinal needle was utilized for procedural planning. Next, an 11 gauge coaxial bone biopsy needle was advanced into the left iliac marrow space. Needle position was confirmed with CT imaging. Initially, bone marrow aspiration was performed. Next, a bone   marrow biopsy was obtained with the 11 gauge outer bone marrow device. Samples were prepared with the cytotechnologist and deemed adequate. The needle was removed intact. Hemostasis was obtained with compression and a dressing was placed. The patient tolerated the procedure well without immediate post procedural complication. IMPRESSION: Successful CT guided left iliac bone marrow aspiration and core biopsy. Electronically Signed   By: John  Watts M.D.   On: 06/30/2018 10:14  ° °Ct Bone Marrow Biopsy & Aspiration ° °Result  Date: 06/30/2018 °INDICATION: History of multiple myeloma. Please perform CT-guided bone marrow biopsy for tissue diagnostic purposes. EXAM: CT-GUIDED BONE MARROW BIOPSY AND ASPIRATION MEDICATIONS: None ANESTHESIA/SEDATION: Fentanyl 100 mcg IV; Versed 2 mg IV Sedation Time: 10 Minutes; The patient was continuously monitored during the procedure by the interventional radiology nurse under my direct supervision. COMPLICATIONS: None immediate. PROCEDURE: Informed consent was obtained from the patient following an explanation of the procedure, risks, benefits and alternatives. The patient understands, agrees and consents for the procedure. All questions were addressed. A time out was performed prior to the initiation of the procedure. The patient was positioned prone and non-contrast localization CT was performed of the pelvis to demonstrate the iliac marrow spaces. The operative site was prepped and draped in the usual sterile fashion. Under sterile conditions and local anesthesia, a 22 gauge spinal needle was utilized for procedural planning. Next, an 11 gauge coaxial bone biopsy needle was advanced into the left iliac marrow space. Needle position was confirmed with CT imaging. Initially, bone marrow aspiration was performed. Next, a bone marrow biopsy was obtained with the 11 gauge outer bone marrow device. Samples were prepared with the cytotechnologist and deemed adequate. The needle was removed intact. Hemostasis was obtained with compression and a dressing was placed. The patient tolerated the procedure well without immediate post procedural complication. IMPRESSION: Successful CT guided left iliac bone marrow aspiration and core biopsy. Electronically Signed   By: John  Watts M.D.   On: 06/30/2018 10:14  ° ° °ASSESSMENT & PLAN:  °62 y.o. male with ° °1. 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 IgG at 2220. °08/11/16 PET/CT revealed Hypermetabolic large soft tissue mass in the lower thoracic paraspinal region associated with lytic destruction of T10 vertebral body, concerning for multiple myeloma. Additional smaller lytic lesions involving the skeleton. °S/p RT x 4 fractions, discontinued due to T10 pathologic fracture with severe cord compression °S/p 08/26/16 T10 corpectomy and T8-L1 posterior spinal fusion  ° °Began 7 cycles of VRd on 09/24/16 °S/p autologous stem cell transplant, Day 0 on 02/23/17 °04/21/17 BM Bx with residual less than 5% lambda light chain restricted plasma cells in the bone marrow. M Spike at 0.4g °Began maintenance 56mg/m2 Carfilzomib every 2 weeks on 06/16/17 ° °06/09/18 PET/CT revealed "Multifocal hypermetabolic lytic lesions in the skeleton as noted above, compatible with active myeloma. In the spine, the most notable active lesion of concern is at the T12 level with there is a large right eccentric vertebral body lesion with demineralization of the cortex and likely some paravertebral extension of tumor. This could cause loosening of the right pedicle screw. Intraspinal extension of tumor is difficult to exclude. MRI might be considered although the posterolateral rod and pedicle screws may cause artifact. 2. There is likewise cortical breakthrough associated with the lytic lesion along the   left upper acetabulum. 3. A lesion in the left intertrochanteric area of the femur is large enough to potentially cause biomechanical weakening which increases risk of fracture. 4. Other skeletal sites of active involvement are detailed above in the skeleton section. 5. Several lucent lesions are observed including the calvarium, T1 vertebral body, and left L4 pedicle which are not hypermetabolic and may represent effectively previously treated lesions. 6. The patient has a large lipoma anterior to the left hip in between the  iloipsoas in the rectus femoris muscles. There is also a lipoma along the right brachialis muscle. 7. Nonspecific subcutaneous edema especially in the right forearm but also extending up into the right upper arm. Cause is uncertain. There is some low-grade nonfocal metabolic activity along this area. Strictly speaking, cellulitis is not excluded, correlate with clinical history and visual inspection. 8. Other imaging findings of potential clinical significance: Aortic Atherosclerosis. Mild cardiomegaly. Cholelithiasis. Prostatomegaly."  06/17/18 MMP revealed M Protein at 0.5g, just before beginning C1 Daratumumab  06/30/18 BM Biopsy revealed normocellular marrow with minimal involvement by plasma cell neoplasm (5% plasma cells), normal cytogenetics. 06/30/18 Molecular Cytogenetics did not have enough material for testing.  PLAN: -Discussed pt labwork today, 07/29/18; ANC normalized to 2.0k. HGB holding at 10.6. -Last available MMP from 07/08/18 revealed M Protein at 0.4g -The pt has no prohibitive toxicities from continuing C2D15 Daratumumab, 64m Pomalyst, and Dexamethasone at this time. -Pomalyst for 3 weeks on, and one week off. -Establish care with Dr. CMaud Deedon 08/22/18 for consideration of a second transplant. Per the pt, he did not have a PET/CT at D100 of his first transplant, and notes that he had an MRI instead. -Pain medication need has decreased with RT. Left inguinal pain improved but continues, will monitor this. 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 -Given high risk myeloma with 17p mutation, [facility] -Continue monthly Zometa given active disease -Continue 1-2 tablets of 565mOxycodone  -MS Contin 1573mo q12h -Continue RT with Rad Onc for spine tumor, hip, and shoulder -Stressed that pt is  to avoid bending, pushing, pulling, lifting, and climbing stairs as much as possible to avoid potential fractures  -Advised that pt establish care with a PCP locally -Will check labs weekly -Will see the pt back in 4 weeks   Please schedule remaining C2, C3 and C4 of treatment Labs with each treatment MD visit in 4 weeks   All of the patients questions were answered with apparent satisfaction. The patient knows to call the clinic with any problems, questions or concerns.  The total time spent in the appt was 25 minutes and more than 50% was on counseling and direct patient cares.    GauSullivan Lone MS AAHIVMS SCHDesert Ridge Out[facility] Surgery CenterHSlidell -[facility] Specialty Hosptialmatology/Oncology Physician ConAudubon County Memorial HospitalOffice):       [phone] cell):  [phone]):           [phone]/26/2020 10:36 AM  I, SchBaldwin Jamaicam acting as a scribe for Dr. GauSullivan Lone .I have reviewed the above documentation for accuracy and completeness, and I agree with the above. .GaBrunetta Genera

## 2018-07-29 ENCOUNTER — Other Ambulatory Visit: Payer: Self-pay

## 2018-07-29 ENCOUNTER — Telehealth: Payer: Self-pay | Admitting: Hematology

## 2018-07-29 ENCOUNTER — Other Ambulatory Visit: Payer: 59

## 2018-07-29 ENCOUNTER — Inpatient Hospital Stay: Payer: 59

## 2018-07-29 ENCOUNTER — Inpatient Hospital Stay (HOSPITAL_BASED_OUTPATIENT_CLINIC_OR_DEPARTMENT_OTHER): Payer: 59 | Admitting: Hematology

## 2018-07-29 VITALS — BP 115/73 | HR 83 | Temp 98.2°F | Resp 18

## 2018-07-29 VITALS — BP 130/68 | HR 92 | Temp 98.9°F | Resp 18 | Ht 67.5 in | Wt 120.1 lb

## 2018-07-29 DIAGNOSIS — C9000 Multiple myeloma not having achieved remission: Secondary | ICD-10-CM | POA: Diagnosis not present

## 2018-07-29 DIAGNOSIS — Z7982 Long term (current) use of aspirin: Secondary | ICD-10-CM

## 2018-07-29 DIAGNOSIS — Z95828 Presence of other vascular implants and grafts: Secondary | ICD-10-CM

## 2018-07-29 DIAGNOSIS — Z5112 Encounter for antineoplastic immunotherapy: Secondary | ICD-10-CM | POA: Diagnosis not present

## 2018-07-29 DIAGNOSIS — G893 Neoplasm related pain (acute) (chronic): Secondary | ICD-10-CM

## 2018-07-29 DIAGNOSIS — Z7189 Other specified counseling: Secondary | ICD-10-CM

## 2018-07-29 DIAGNOSIS — N400 Enlarged prostate without lower urinary tract symptoms: Secondary | ICD-10-CM | POA: Diagnosis not present

## 2018-07-29 DIAGNOSIS — Z79899 Other long term (current) drug therapy: Secondary | ICD-10-CM

## 2018-07-29 LAB — CBC WITH DIFFERENTIAL (CANCER CENTER ONLY)
Abs Immature Granulocytes: 0 10*3/uL (ref 0.00–0.07)
Basophils Absolute: 0 10*3/uL (ref 0.0–0.1)
Basophils Relative: 0 %
Eosinophils Absolute: 0 10*3/uL (ref 0.0–0.5)
Eosinophils Relative: 1 %
HCT: 32.3 % — ABNORMAL LOW (ref 39.0–52.0)
Hemoglobin: 10.6 g/dL — ABNORMAL LOW (ref 13.0–17.0)
Lymphocytes Relative: 17 %
Lymphs Abs: 0.5 10*3/uL — ABNORMAL LOW (ref 0.7–4.0)
MCH: 29.7 pg (ref 26.0–34.0)
MCHC: 32.8 g/dL (ref 30.0–36.0)
MCV: 90.5 fL (ref 80.0–100.0)
Monocytes Absolute: 0.4 10*3/uL (ref 0.1–1.0)
Monocytes Relative: 13 %
Neutro Abs: 2 10*3/uL (ref 1.7–17.7)
Neutrophils Relative %: 69 %
Platelet Count: 145 10*3/uL — ABNORMAL LOW (ref 150–400)
RBC: 3.57 MIL/uL — ABNORMAL LOW (ref 4.22–5.81)
RDW: 15.8 % — ABNORMAL HIGH (ref 11.5–15.5)
WBC Count: 2.9 10*3/uL — ABNORMAL LOW (ref 4.0–10.5)
nRBC: 0 % (ref 0.0–0.2)

## 2018-07-29 LAB — CMP (CANCER CENTER ONLY)
ALT: 23 U/L (ref 0–44)
AST: 13 U/L — ABNORMAL LOW (ref 15–41)
Albumin: 3.4 g/dL — ABNORMAL LOW (ref 3.5–5.0)
Alkaline Phosphatase: 87 U/L (ref 38–126)
Anion gap: 9 (ref 5–15)
BUN: 12 mg/dL (ref 8–23)
CO2: 23 mmol/L (ref 22–32)
Calcium: 8.8 mg/dL — ABNORMAL LOW (ref 8.9–10.3)
Chloride: 107 mmol/L (ref 98–111)
Creatinine: 0.7 mg/dL (ref 0.61–1.24)
GFR, Est AFR Am: >60 mL/min (ref >60)
GFR, Estimated: >60 mL/min (ref >60)
Glucose, Bld: 127 mg/dL — ABNORMAL HIGH (ref 70–99)
Potassium: 3.4 mmol/L — ABNORMAL LOW (ref 3.5–5.1)
Sodium: 139 mmol/L (ref 135–145)
Total Bilirubin: 0.5 mg/dL (ref 0.3–1.2)
Total Protein: 6.3 g/dL — ABNORMAL LOW (ref 6.5–8.1)

## 2018-07-29 MED ORDER — FAMOTIDINE IN NACL 20-0.9 MG/50ML-% IV SOLN
20.0000 mg | Freq: Once | INTRAVENOUS | Status: AC
  Administered 2018-07-29: 20 mg via INTRAVENOUS

## 2018-07-29 MED ORDER — SODIUM CHLORIDE 0.9% FLUSH
10.0000 mL | INTRAVENOUS | Status: DC | PRN
  Administered 2018-07-29: 10 mL
  Filled 2018-07-29: qty 10

## 2018-07-29 MED ORDER — DIPHENHYDRAMINE HCL 25 MG PO CAPS
50.0000 mg | ORAL_CAPSULE | Freq: Once | ORAL | Status: AC
  Administered 2018-07-29: 50 mg via ORAL

## 2018-07-29 MED ORDER — SODIUM CHLORIDE 0.9 % IV SOLN
15.7000 mg/kg | Freq: Once | INTRAVENOUS | Status: AC
  Administered 2018-07-29: 900 mg via INTRAVENOUS
  Filled 2018-07-29: qty 40

## 2018-07-29 MED ORDER — SODIUM CHLORIDE 0.9 % IV SOLN
Freq: Once | INTRAVENOUS | Status: AC
  Administered 2018-07-29: 11:00:00 via INTRAVENOUS
  Filled 2018-07-29: qty 250

## 2018-07-29 MED ORDER — DIPHENHYDRAMINE HCL 25 MG PO CAPS
ORAL_CAPSULE | ORAL | Status: AC
  Filled 2018-07-29: qty 2

## 2018-07-29 MED ORDER — HEPARIN SOD (PORK) LOCK FLUSH 100 UNIT/ML IV SOLN
500.0000 [IU] | Freq: Once | INTRAVENOUS | Status: AC | PRN
  Administered 2018-07-29: 500 [IU]
  Filled 2018-07-29: qty 5

## 2018-07-29 MED ORDER — ACETAMINOPHEN 325 MG PO TABS
ORAL_TABLET | ORAL | Status: AC
  Filled 2018-07-29: qty 2

## 2018-07-29 MED ORDER — FAMOTIDINE IN NACL 20-0.9 MG/50ML-% IV SOLN
INTRAVENOUS | Status: AC
  Filled 2018-07-29: qty 50

## 2018-07-29 MED ORDER — SODIUM CHLORIDE 0.9 % IV SOLN
20.0000 mg | Freq: Once | INTRAVENOUS | Status: AC
  Administered 2018-07-29: 20 mg via INTRAVENOUS
  Filled 2018-07-29: qty 20

## 2018-07-29 MED ORDER — SODIUM CHLORIDE 0.9% FLUSH
10.0000 mL | Freq: Once | INTRAVENOUS | Status: AC
  Administered 2018-07-29: 10 mL
  Filled 2018-07-29: qty 10

## 2018-07-29 MED ORDER — ACETAMINOPHEN 325 MG PO TABS
650.0000 mg | ORAL_TABLET | Freq: Once | ORAL | Status: AC
  Administered 2018-07-29: 650 mg via ORAL

## 2018-07-29 NOTE — Patient Instructions (Signed)
[facility] Discharge Instructions for Patients Receiving Chemotherapy  Today you received the following chemotherapy agents: Darzalex  To help prevent nausea and vomiting after your treatment, we encourage you to take your nausea medication as directed.    If you develop nausea and vomiting that is not controlled by your nausea medication, call the clinic.   BELOW ARE SYMPTOMS THAT SHOULD BE REPORTED IMMEDIATELY:  *FEVER GREATER THAN 100.5 F  *CHILLS WITH OR WITHOUT FEVER  NAUSEA AND VOMITING THAT IS NOT CONTROLLED WITH YOUR NAUSEA MEDICATION  *UNUSUAL SHORTNESS OF BREATH  *UNUSUAL BRUISING OR BLEEDING  TENDERNESS IN MOUTH AND THROAT WITH OR WITHOUT PRESENCE OF ULCERS  *URINARY PROBLEMS  *BOWEL PROBLEMS  UNUSUAL RASH Items with * indicate a potential emergency and should be followed up as soon as possible.  Feel free to call the clinic should you have any questions or concerns. The clinic phone number is [phone].  Please show the CHEMO ALERT CARD at check-in to the Emergency Department and triage nurse.   

## 2018-07-29 NOTE — Telephone Encounter (Signed)
Scheduled appt per 6/26 los. °

## 2018-08-04 ENCOUNTER — Other Ambulatory Visit: Payer: Self-pay

## 2018-08-04 ENCOUNTER — Inpatient Hospital Stay: Payer: 59 | Attending: Hematology

## 2018-08-04 ENCOUNTER — Inpatient Hospital Stay: Payer: 59

## 2018-08-04 VITALS — BP 113/67 | HR 76 | Temp 98.4°F | Resp 20

## 2018-08-04 DIAGNOSIS — C9000 Multiple myeloma not having achieved remission: Secondary | ICD-10-CM

## 2018-08-04 DIAGNOSIS — G893 Neoplasm related pain (acute) (chronic): Secondary | ICD-10-CM | POA: Insufficient documentation

## 2018-08-04 DIAGNOSIS — Z7982 Long term (current) use of aspirin: Secondary | ICD-10-CM | POA: Diagnosis not present

## 2018-08-04 DIAGNOSIS — Z5112 Encounter for antineoplastic immunotherapy: Secondary | ICD-10-CM | POA: Diagnosis present

## 2018-08-04 DIAGNOSIS — Z7189 Other specified counseling: Secondary | ICD-10-CM

## 2018-08-04 DIAGNOSIS — N400 Enlarged prostate without lower urinary tract symptoms: Secondary | ICD-10-CM | POA: Insufficient documentation

## 2018-08-04 DIAGNOSIS — Z95828 Presence of other vascular implants and grafts: Secondary | ICD-10-CM

## 2018-08-04 DIAGNOSIS — Z79899 Other long term (current) drug therapy: Secondary | ICD-10-CM | POA: Diagnosis not present

## 2018-08-04 LAB — CBC WITH DIFFERENTIAL/PLATELET
Abs Immature Granulocytes: 0.01 10*3/uL (ref 0.00–0.07)
Basophils Absolute: 0 10*3/uL (ref 0.0–0.1)
Basophils Relative: 0 %
Eosinophils Absolute: 0.1 10*3/uL (ref 0.0–0.5)
Eosinophils Relative: 5 %
HCT: 31.1 % — ABNORMAL LOW (ref 39.0–52.0)
Hemoglobin: 10.2 g/dL — ABNORMAL LOW (ref 13.0–17.0)
Immature Granulocytes: 0 %
Lymphocytes Relative: 42 %
Lymphs Abs: 1.1 10*3/uL (ref 0.7–4.0)
MCH: 30.4 pg (ref 26.0–34.0)
MCHC: 32.8 g/dL (ref 30.0–36.0)
MCV: 92.6 fL (ref 80.0–100.0)
Monocytes Absolute: 0.4 10*3/uL (ref 0.1–1.0)
Monocytes Relative: 16 %
Neutro Abs: 1 10*3/uL — ABNORMAL LOW (ref 1.7–7.7)
Neutrophils Relative %: 37 %
Platelets: 123 10*3/uL — ABNORMAL LOW (ref 150–400)
RBC: 3.36 MIL/uL — ABNORMAL LOW (ref 4.22–5.81)
RDW: 16.4 % — ABNORMAL HIGH (ref 11.5–15.5)
WBC: 2.7 10*3/uL — ABNORMAL LOW (ref 4.0–10.5)
nRBC: 0 % (ref 0.0–0.2)

## 2018-08-04 LAB — CMP (CANCER CENTER ONLY)
ALT: 25 U/L (ref 0–44)
AST: 16 U/L (ref 15–41)
Albumin: 3.4 g/dL — ABNORMAL LOW (ref 3.5–5.0)
Alkaline Phosphatase: 74 U/L (ref 38–126)
Anion gap: 8 (ref 5–15)
BUN: 8 mg/dL (ref 8–23)
CO2: 24 mmol/L (ref 22–32)
Calcium: 9 mg/dL (ref 8.9–10.3)
Chloride: 106 mmol/L (ref 98–111)
Creatinine: 0.74 mg/dL (ref 0.61–1.24)
GFR, Est AFR Am: >60 mL/min
GFR, Estimated: >60 mL/min
Glucose, Bld: 102 mg/dL — ABNORMAL HIGH (ref 70–99)
Potassium: 4.1 mmol/L (ref 3.5–5.1)
Sodium: 138 mmol/L (ref 135–145)
Total Bilirubin: 0.5 mg/dL (ref 0.3–1.2)
Total Protein: 6.1 g/dL — ABNORMAL LOW (ref 6.5–8.1)

## 2018-08-04 MED ORDER — ACETAMINOPHEN 325 MG PO TABS
ORAL_TABLET | ORAL | Status: AC
  Filled 2018-08-04: qty 2

## 2018-08-04 MED ORDER — SODIUM CHLORIDE 0.9% FLUSH
10.0000 mL | INTRAVENOUS | Status: DC | PRN
  Administered 2018-08-04: 10 mL
  Filled 2018-08-04: qty 10

## 2018-08-04 MED ORDER — SODIUM CHLORIDE 0.9% FLUSH
10.0000 mL | Freq: Once | INTRAVENOUS | Status: AC
  Administered 2018-08-04: 11:00:00 10 mL
  Filled 2018-08-04: qty 10

## 2018-08-04 MED ORDER — SODIUM CHLORIDE 0.9 % IV SOLN
15.5000 mg/kg | Freq: Once | INTRAVENOUS | Status: AC
  Administered 2018-08-04: 900 mg via INTRAVENOUS
  Filled 2018-08-04: qty 40

## 2018-08-04 MED ORDER — FAMOTIDINE IN NACL 20-0.9 MG/50ML-% IV SOLN
20.0000 mg | Freq: Once | INTRAVENOUS | Status: AC
  Administered 2018-08-04: 12:00:00 20 mg via INTRAVENOUS

## 2018-08-04 MED ORDER — SODIUM CHLORIDE 0.9 % IV SOLN
20.0000 mg | Freq: Once | INTRAVENOUS | Status: AC
  Administered 2018-08-04: 12:00:00 20 mg via INTRAVENOUS
  Filled 2018-08-04: qty 20

## 2018-08-04 MED ORDER — DIPHENHYDRAMINE HCL 25 MG PO CAPS
50.0000 mg | ORAL_CAPSULE | Freq: Once | ORAL | Status: AC
  Administered 2018-08-04: 12:00:00 50 mg via ORAL

## 2018-08-04 MED ORDER — FAMOTIDINE IN NACL 20-0.9 MG/50ML-% IV SOLN
INTRAVENOUS | Status: AC
  Filled 2018-08-04: qty 50

## 2018-08-04 MED ORDER — DIPHENHYDRAMINE HCL 25 MG PO CAPS
ORAL_CAPSULE | ORAL | Status: AC
  Filled 2018-08-04: qty 2

## 2018-08-04 MED ORDER — HEPARIN SOD (PORK) LOCK FLUSH 100 UNIT/ML IV SOLN
500.0000 [IU] | Freq: Once | INTRAVENOUS | Status: AC | PRN
  Administered 2018-08-04: 15:00:00 500 [IU]
  Filled 2018-08-04: qty 5

## 2018-08-04 MED ORDER — ACETAMINOPHEN 325 MG PO TABS
650.0000 mg | ORAL_TABLET | Freq: Once | ORAL | Status: AC
  Administered 2018-08-04: 12:00:00 650 mg via ORAL

## 2018-08-04 MED ORDER — SODIUM CHLORIDE 0.9 % IV SOLN
Freq: Once | INTRAVENOUS | Status: AC
  Administered 2018-08-04: 12:00:00 via INTRAVENOUS
  Filled 2018-08-04: qty 250

## 2018-08-04 NOTE — Progress Notes (Signed)
Ok to treat with CBC today per MD Kale °

## 2018-08-04 NOTE — Patient Instructions (Signed)
Coronavirus (COVID-19) Are you at risk?  Are you at risk for the Coronavirus (COVID-19)?  To be considered HIGH RISK for Coronavirus (COVID-19), you have to meet the following criteria:  . Traveled to [place], [place], [place], [place] or [place]; or in the [place] to [place], [place], [place], or [place]; and have fever, cough, and shortness of breath within the last 2 weeks of travel OR . Been in close contact with a person diagnosed with COVID-19 within the last 2 weeks and have fever, cough, and shortness of breath . IF YOU DO NOT MEET THESE CRITERIA, YOU ARE CONSIDERED LOW RISK FOR COVID-19.  What to do if you are HIGH RISK for COVID-19?  . If you are having a medical emergency, call 911. . Seek medical care right away. Before you go to a doctor's office, urgent care or emergency department, call ahead and tell them about your recent travel, contact with someone diagnosed with COVID-19, and your symptoms. You should receive instructions from your physician's office regarding next steps of care.  . When you arrive at healthcare provider, tell the healthcare staff immediately you have returned from visiting [place], [place], [place], [place] or [place]; or traveled in the [place] to [place], [place], [place], or [place]; in the last two weeks or you have been in close contact with a person diagnosed with COVID-19 in the last 2 weeks.   . Tell the health care staff about your symptoms: fever, cough and shortness of breath. . After you have been seen by a medical provider, you will be either: o Tested for (COVID-19) and discharged home on quarantine except to seek medical care if symptoms worsen, and asked to  - Stay home and avoid contact with others until you get your results (4-5 days)  - Avoid travel on public transportation if possible (such as bus, train, or airplane) or o Sent to the Emergency Department by EMS for evaluation, COVID-19 testing, and possible  admission depending on your condition and test results.  What to do if you are LOW RISK for COVID-19?  Reduce your risk of any infection by using the same precautions used for avoiding the common cold or flu:  . Wash your hands often with soap and warm water for at least 20 seconds.  If soap and water are not readily available, use an alcohol-based hand sanitizer with at least 60% alcohol.  . If coughing or sneezing, cover your mouth and nose by coughing or sneezing into the elbow areas of your shirt or coat, into a tissue or into your sleeve (not your hands). . Avoid shaking hands with others and consider head nods or verbal greetings only. . Avoid touching your eyes, nose, or mouth with unwashed hands.  . Avoid close contact with people who are sick. . Avoid places or events with large numbers of people in one location, like concerts or sporting events. . Carefully consider travel plans you have or are making. . If you are planning any travel outside or inside the [place], visit the CDC's Travelers' Health webpage for the latest health notices. . If you have some symptoms but not all symptoms, continue to monitor at home and seek medical attention if your symptoms worsen. . If you are having a medical emergency, call 911.   ADDITIONAL HEALTHCARE OPTIONS FOR PATIENTS  [facility] / e-Visit: [email or web address]         [facility]: [phone]  [facility]: [phone]                   [facility]: [phone]   [facility] Discharge Instructions for Patients Receiving Chemotherapy  Today you received the following chemotherapy agents Darzalex  To help prevent nausea and vomiting after your treatment, we encourage you to take your nausea medication as directed.    If you develop nausea and vomiting that is not controlled by your nausea medication, call the clinic.   BELOW ARE  SYMPTOMS THAT SHOULD BE REPORTED IMMEDIATELY:  *FEVER GREATER THAN 100.5 F  *CHILLS WITH OR WITHOUT FEVER  NAUSEA AND VOMITING THAT IS NOT CONTROLLED WITH YOUR NAUSEA MEDICATION  *UNUSUAL SHORTNESS OF BREATH  *UNUSUAL BRUISING OR BLEEDING  TENDERNESS IN MOUTH AND THROAT WITH OR WITHOUT PRESENCE OF ULCERS  *URINARY PROBLEMS  *BOWEL PROBLEMS  UNUSUAL RASH Items with * indicate a potential emergency and should be followed up as soon as possible.  Feel free to call the clinic should you have any questions or concerns. The clinic phone number is [phone].  Please show the CHEMO ALERT CARD at check-in to the Emergency Department and triage nurse.   

## 2018-08-08 ENCOUNTER — Other Ambulatory Visit: Payer: Self-pay | Admitting: Hematology

## 2018-08-08 ENCOUNTER — Other Ambulatory Visit: Payer: Self-pay | Admitting: *Deleted

## 2018-08-08 DIAGNOSIS — C9000 Multiple myeloma not having achieved remission: Secondary | ICD-10-CM

## 2018-08-08 DIAGNOSIS — Z7189 Other specified counseling: Secondary | ICD-10-CM

## 2018-08-08 MED ORDER — POMALIDOMIDE 2 MG PO CAPS: ORAL_CAPSULE | ORAL | 0 refills | Status: DC

## 2018-08-08 NOTE — Telephone Encounter (Signed)
Refill request for Pomalyst received from [facility]) [place], [place].     Refilled per Dr. Irene Limbo OV note 6/26. Fanny Dance #[id number], 08/08/2018

## 2018-08-11 NOTE — Progress Notes (Signed)
HEMATOLOGY/ONCOLOGY CLINIC NOTE  Date of Service: 08/12/2018  Patient Care Team: Patient, No Pcp Per as PCP - General (General Practice)  CHIEF COMPLAINTS/PURPOSE OF CONSULTATION:   Continued management of Multiple Myeloma  Oncologic History:   Jesus [name] was diagnosed with multiple myeloma in July 2018 in [place]. He initially developed mid back pain in May 2018 and was seen to have a T10 vertebral body mass with additional lytic lesion at T9. His initial M spike in July 2018 was 1.5g with IgG Lambda specificity and IgG elevated at 2215m. His July 2018 BM Bx revealed 20% monoclonal plasma cells and FISH revealed a 17p deletion. He received 4 fractions of palliative RT, however his back pain worsened and developed bilateral lower extremity numbness and tingling, and was then seen to have a T10 pathologic fracture with cord compression s/p T10 corpectomy. The pt began VRd on 09/24/16, completed 7 cycles, then [facility], post transplant complicated by neutropenic colitis and deconditioning. Repeat BM Bx on 04/21/17 revealed residual plasma cell with less than 5% lambda light chain restricted plasma cells, and a post-transplant M spike of 0.4g. The pt then began maintenance 543mm2 Carfilzomib every 2 weeks on 06/16/17.  HISTORY OF PRESENTING ILLNESS:   Jesus AVINOs a wonderful [age].o. male who has been referred to [place] Dr. BePhil Doppor evaluation and management of Multiple Myeloma. The pt reports that he is doing well overall.  The [place], the pt has moved back to his home which is here locally, and is transferring his care here now as well. The pt notes that he is anticipating C11D1 maintenance Carfilzomib today. The pt notes that his most recent M  spike was 0.2g. His last BM biopsy was March 2019, as noted above. He denies any infection issues in the last year. The pt notes that he has received all of is post-transplant vaccinations. [facility]  The pt reports that he continues to have back pain. He has been receiving Zometa every 4 weeks, but was recently transitioned to every 3 months, he denies dental concerns at this time. He is taking Calcium and Vitamin D replacement.   The pt has been taking Marinol for appetite stimulation.   The pt notes that he has an enlarged prostate, which was seen on imaging. He notes that he was expecting to receive a PSA test soon.  The pt notes that prior to his Multiple Myeloma diagnosis in July 2018, he had a "clean bill of health." He denies DM, HTN, lung problems, heart problems, thyroid problems or other medical concerns.  Most recent lab results (03/23/18) of CBC is as follows: all values are WNL except for RBC at 4.03, HGB at 11.5, HCT at 35.6%, MPV at 8.9, Glucose at 135, Total Protein at 6.2.  On review of systems, pt reports chronic back pain, stable energy levels, and denies concerns for infections, new pain along the spine, abdominal pains, leg swelling, and any other symptoms.   On PMHx the pt reports Multiple Myeloma, denies HTN, HLD, lung problems, strokes, seizures, heart problems and thyroid problems. On Social Hx the pt reports working for the [employer] Interval History:  Jesus [name] returns today for management and evaluation of his multiple myeloma not in remission. The patient's last visit with [place] was on 07/29/18. The pt reports that he is doing well overall.  The pt reports that he has continued to have left groin pain and now endorses some intermittent pain in his lower left leg which began last week without a particular trigger. Exacerbated when sitting down. He denies local injury. He describes this new lower  left leg pain as a "burning sensation," which feels similar to his previous right hip involvement. He denies tenderness to touch in his lower left leg. The pt denies any new back pains. He denies increased pain with ambulation.   The pt notes that his right hip pain is more stable now.   The pt denies any new fevers, chills, night sweats or concerns for infections.   The pt feels some soreness in the skin of his right armpit. He has not changed deodorant nor soap.  The pt is now in his off week of Pomalyst, and his first day of holding the medication was on 08/09/18.  Lab results today (08/12/18) of CBC w/diff and CMP is as follows: all values are WNL except for WBC at 2.4k, RBC at 3.51, HGB at 10.7, HCT at 32.5, RDW at 17.2, PLT at 108k, ANC at 900, CO2 at 21, Glucose at 164, BUN at 7, Calcium at 8.7, Total Protein at 6.1, Albumin at 3.2.  On review of systems, pt reports intermittent lower left leg pain, continued left groin pain, stabilized right hip pain, and denies new back pains, tenderness to touch in LLE, fevers, chills, night sweats, concerns for infections, abdominal pains, leg swelling, and any other symptoms.    MEDICAL HISTORY:  Past Medical History:  Diagnosis Date   Cancer [facility])     SURGICAL HISTORY: Past Surgical History:  Procedure Laterality Date   BACK SURGERY     IR IMAGING GUIDED PORT INSERTION  06/21/2018    SOCIAL HISTORY: Social History   Socioeconomic History   Marital status: Married    Spouse name: Not on file   Number of children: Not on file   Years of education: Not on file   Highest education level: Not on file  Occupational History   Not on file  Social Needs   Financial resource strain: Not on file   Food insecurity    Worry: Not on file    Inability: Not on file   Transportation needs    Medical: No    Non-medical: No  Tobacco Use   Smoking status: Never Smoker   Smokeless tobacco: Never Used  Substance and Sexual Activity     Alcohol use: Never    Frequency: Never   Drug use: Never   Sexual activity: Not on file  Lifestyle   Physical activity    Days per week: Not on file    Minutes per session: Not on file   Stress: Not on file  Relationships   Social connections    Talks on phone: Not on file    Gets together: Not on file    Attends religious service: Not on file    Active member of club or organization: Not on file    Attends meetings of clubs or organizations: Not on file    Relationship status: Not on file   Intimate partner violence    Fear of current or ex partner: Not on file    Emotionally abused: Not on  file    Physically abused: Not on file    Forced sexual activity: Not on file  Other Topics Concern   Not on file  Social History Narrative   Not on file    FAMILY HISTORY: Family History  Problem Relation Age of Onset   Breast cancer Sister     ALLERGIES:  is allergic to penicillins.  MEDICATIONS:  Current Outpatient Medications  Medication Sig Dispense Refill   acetaminophen (TYLENOL) 325 MG tablet Take by mouth.     acyclovir (ZOVIRAX) 400 MG tablet Take 1 tablet (400 mg total) by mouth 2 (two) times daily. 60 tablet 2   acyclovir (ZOVIRAX) 400 MG tablet Take 1 tablet (400 mg total) by mouth 2 (two) times daily. 60 tablet 11   aspirin 81 MG EC tablet Take by mouth.     atovaquone-proguanil (MALARONE) 250-100 MG TABS tablet TAKE 1 TABLET BY MOUTH ONCE DAILY FOR 25 DAYS BEGIN 1 2 DAYS BEFORE AND CONTINUE UNTIL 1 WEEK AFTER EXPOSURE FOR PREVENTION OF MALARIA     buPROPion (WELLBUTRIN SR) 150 MG 12 hr tablet 1 pill a day for 3 days.  Then BID     Calcium Carb-Cholecalciferol (CALCIUM 1000 + D) 1000-800 MG-UNIT TABS Take 1,000 mg by mouth once.     Cholecalciferol (VITAMIN D3) 25 MCG (1000 UT) CAPS Take by mouth.     ciprofloxacin (CIPRO) 500 MG tablet TAKE 1 TABLET BY MOUTH TWICE DAILY FOR 5 DAYS MAY TAKE FOR 3 5 DAYS FOR TRAVELERS DIARRHEA MAY DISCONTINUE WHEN  SYMPTOMS RESOLVED     Clotrimazole 1 % OINT Apply 1 application topically 2 (two) times a day for 14 days. 56.7 g 1   dexamethasone (DECADRON) 4 MG tablet Take 5 tablets (69m) with breakfast the day after each dose of Daratumumab 120 tablet 1   doxycycline (VIBRA-TABS) 100 MG tablet Take by mouth.     dronabinol (MARINOL) 10 MG capsule Take 1 capsule (10 mg total) by mouth 2 (two) times daily before a meal. 60 capsule 0   gabapentin (NEURONTIN) 300 MG capsule Take 1 capsule (300 mg total) by mouth 3 (three) times daily. 90 capsule 2   lidocaine-prilocaine (EMLA) cream Apply to affected area once 30 g 3   LORazepam (ATIVAN) 0.5 MG tablet Take by mouth.     morphine (MS CONTIN) 15 MG 12 hr tablet Take 1 tablet (15 mg total) by mouth every 12 (twelve) hours. 60 tablet 0   Multiple Vitamins-Minerals (ONE DAILY CALCIUM/IRON) TABS Take by mouth.     ondansetron (ZOFRAN) 4 MG tablet Take 1 tablet (4 mg total) by mouth every 8 (eight) hours as needed for nausea or vomiting. 30 tablet 0   ondansetron (ZOFRAN) 8 MG tablet Take 1 tablet (8 mg total) by mouth 2 (two) times daily as needed (Nausea or vomiting). 30 tablet 1   ondansetron (ZOFRAN-ODT) 4 MG disintegrating tablet Place under the tongue.     oxyCODONE (OXY IR/ROXICODONE) 5 MG immediate release tablet Take 1 tablet (5 mg total) by mouth every 4 (four) hours as needed for severe pain. 120 tablet 0   pomalidomide (POMALYST) 2 MG capsule TAKE 1 CAPSULE BY MOUTH  DAILY FOR 21 DAYS, THEN 7  DAYS OFF - TAKE WITH WATER 21 capsule 0   prochlorperazine (COMPAZINE) 10 MG tablet Take 1 tablet (10 mg total) by mouth every 6 (six) hours as needed (Nausea or vomiting). 30 tablet 1   senna-docusate (SENOKOT-S) 8.6-50 MG tablet Take by mouth.  sucralfate (CARAFATE) 1 g tablet Take 1 tablet (1 g total) by mouth 4 (four) times daily. 120 tablet 0   traMADol (ULTRAM) 50 MG tablet TAKE 1 TABLET BY MOUTH EVERY 6 HOURS AS NEEDED FOR MODERATE PAIN OR  SEVERE PAIN     No current facility-administered medications for this visit.     REVIEW OF SYSTEMS:    A 10+ POINT REVIEW OF SYSTEMS WAS OBTAINED including neurology, dermatology, psychiatry, cardiac, respiratory, lymph, extremities, GI, GU, Musculoskeletal, constitutional, breasts, reproductive, HEENT.  All pertinent positives are noted in the HPI.  All others are negative.   PHYSICAL EXAMINATION: ECOG PERFORMANCE STATUS: 1 - Symptomatic but completely ambulatory  Vitals:   08/12/18 0942  BP: 138/82  Pulse: 70  Resp: 20  Temp: 98.2 F (36.8 C)  SpO2: 100%   Filed Weights   08/12/18 0942  Weight: 121 lb 9.6 oz (55.2 kg)   .Body mass index is 18.76 kg/m.  GENERAL:alert, in no acute distress and comfortable SKIN: right axillary hypopigmentation EYES: conjunctiva are pink and non-injected, sclera anicteric OROPHARYNX: MMM, no exudates, no oropharyngeal erythema or ulceration NECK: supple, no JVD LYMPH:  no palpable lymphadenopathy in the cervical, axillary or inguinal regions LUNGS: clear to auscultation b/l with normal respiratory effort HEART: regular rate & rhythm ABDOMEN:  normoactive bowel sounds , non tender, not distended. No palpable hepatosplenomegaly.  Extremity: no pedal edema PSYCH: alert & oriented x 3 with fluent speech NEURO: no focal motor/sensory deficits   LABORATORY DATA:  I have reviewed the data as listed  . CBC Latest Ref Rng & Units 08/12/2018 08/04/2018 07/29/2018  WBC 4.0 - 10.5 K/uL 2.4(L) 2.7(L) 2.9(L)  Hemoglobin 13.0 - 17.0 g/dL 10.7(L) 10.2(L) 10.6(L)  Hematocrit 39.0 - 52.0 % 32.5(L) 31.1(L) 32.3(L)  Platelets 150 - 400 K/uL 108(L) 123(L) 145(L)    . CMP Latest Ref Rng & Units 08/12/2018 08/04/2018 07/29/2018  Glucose 70 - 99 mg/dL 164(H) 102(H) 127(H)  BUN 8 - 23 mg/dL 7(L) 8 12  Creatinine 0.61 - 1.24 mg/dL 0.66 0.74 0.70  Sodium 135 - 145 mmol/L 140 138 139  Potassium 3.5 - 5.1 mmol/L 3.6 4.1 3.4(L)  Chloride 98 - 111 mmol/L 108 106  107  CO2 22 - 32 mmol/L 21(L) 24 23  Calcium 8.9 - 10.3 mg/dL 8.7(L) 9.0 8.8(L)  Total Protein 6.5 - 8.1 g/dL 6.1(L) 6.1(L) 6.3(L)  Total Bilirubin 0.3 - 1.2 mg/dL 0.3 0.5 0.5  Alkaline Phos 38 - 126 U/L 83 74 87  AST 15 - 41 U/L 16 16 13(L)  ALT 0 - 44 U/L _0 06/30/18 BM Biopsy:    RADIOGRAPHIC STUDIES: I have personally reviewed the radiological images as listed and agreed with the findings in the report. No results found.  ASSESSMENT & PLAN:  63 y.o. male with  1. 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 IgG at 2220. 08/11/16 PET/CT revealed Hypermetabolic large soft tissue mass in the lower thoracic paraspinal region associated with lytic destruction of T10 vertebral body, concerning for multiple myeloma. Additional smaller lytic lesions involving the skeleton. S/p RT x 4 fractions, discontinued due to T10 pathologic fracture with severe cord compression S/p 08/26/16 T10 corpectomy and T8-L1 posterior spinal fusion   Began 7 cycles of VRd on 09/24/16 S/p autologous stem  cell transplant, Day 0 on 02/23/17 04/21/17 BM Bx with residual less than 5% lambda light chain restricted plasma cells in the bone marrow. M Spike at 0.4g Began maintenance 73m/m2 Carfilzomib every 2 weeks on 06/16/17  06/09/18 PET/CT revealed "Multifocal hypermetabolic lytic lesions in the skeleton as noted above, compatible with active myeloma. In the spine, the most notable active lesion of concern is at the T12 level with there is a large right eccentric vertebral body lesion with demineralization of the cortex and likely some paravertebral extension of tumor. This could cause loosening of the right pedicle screw. Intraspinal extension of tumor is difficult to exclude. MRI might be considered although the posterolateral  rod and pedicle screws may cause artifact. 2. There is likewise cortical breakthrough associated with the lytic lesion along the left upper acetabulum. 3. A lesion in the left intertrochanteric area of the femur is large enough to potentially cause biomechanical weakening which increases risk of fracture. 4. Other skeletal sites of active involvement are detailed above in the skeleton section. 5. Several lucent lesions are observed including the calvarium, T1 vertebral body, and left L4 pedicle which are not hypermetabolic and may represent effectively previously treated lesions. 6. 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 There is also a lipoma along the right brachialis muscle. 7. Nonspecific subcutaneous edema especially in the right forearm but also extending up into the right upper arm. Cause is uncertain. There is some low-grade nonfocal metabolic activity along this area. Strictly speaking, cellulitis is not excluded, correlate with clinical history and visual inspection. 8. Other imaging findings of potential clinical significance: Aortic Atherosclerosis. Mild cardiomegaly. Cholelithiasis. Prostatomegaly."  06/17/18 MMP revealed M Protein at 0.5g, just before beginning C1 Daratumumab  06/30/18 BM Biopsy revealed normocellular marrow with minimal involvement by plasma cell neoplasm (5% plasma cells), normal cytogenetics. 06/30/18 Molecular Cytogenetics did not have enough material for testing.  PLAN: -Discussed pt labwork today, 08/12/18; Alk phos stable. Calcium normal. Kidney functions normal. HGB improved to 10.7. WBC and PLT a little low, likely due to Pomalyst -The pt has no prohibitive toxicities from beginning C3D1 Daratumuab, 280mPomalyst and Dexamethasone, at this time. Pomalyst for 3 weeks on and one week off. -Will move now from weekly to every other week Daratumumab, for the next 4 months -Would recommend continuing  treatment for 4-6 cycles before repeating BM and PET/CT -[facility] of a second transplant. Per the pt, he did not have a PET/CT at D100 of his first transplant, and notes that he had an MRI instead. -Continue follow up with Dr. 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 Left inguinal pain improved but continues, will monitor this. 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 -Given high risk myeloma with 17p mutation, [facility] role [facility] second [facility] transplant -Continue monthly Zometa given active disease -Continue 1-2 tablets of 22m41mxycodone  -MS Contin 122m522m q12h -Continue RT with Rad Onc for spine tumor, hip, and shoulder -Stressed that pt is to avoid bending, pushing, pulling, lifting, and climbing  stairs as much as possible to avoid potential fractures  -Advised that pt establish care with a PCP locally -Will see the pt back in 2 weeks   F/u as per scheduled appointment for 7/24   All of the patients questions were answered with apparent satisfaction. The patient knows to call the clinic with any problems, questions or concerns.  The total time spent in the appt was 25 minutes and more than 50% was on counseling and direct patient cares.    Sullivan Lone MD MS AAHIVMS [facility] Hematology/Oncology Physician [facility]  (Office):       [phone] (Work cell):  [phone] (Fax):           [phone]  08/12/2018 10:18 AM  I, Baldwin [place], am acting as a scribe for Dr. Sullivan Lone.   I have reviewed the above documentation for accuracy and  completeness, and I agree with the above. Brunetta Genera MD

## 2018-08-12 ENCOUNTER — Other Ambulatory Visit: Payer: Self-pay

## 2018-08-12 ENCOUNTER — Inpatient Hospital Stay: Payer: 59

## 2018-08-12 ENCOUNTER — Inpatient Hospital Stay (HOSPITAL_BASED_OUTPATIENT_CLINIC_OR_DEPARTMENT_OTHER): Payer: 59 | Admitting: Hematology

## 2018-08-12 ENCOUNTER — Telehealth: Payer: Self-pay | Admitting: Hematology

## 2018-08-12 VITALS — BP 138/82 | HR 70 | Temp 98.2°F | Resp 20 | Ht 67.5 in | Wt 121.6 lb

## 2018-08-12 VITALS — BP 140/88 | HR 82 | Temp 98.3°F | Resp 18

## 2018-08-12 DIAGNOSIS — N400 Enlarged prostate without lower urinary tract symptoms: Secondary | ICD-10-CM | POA: Diagnosis not present

## 2018-08-12 DIAGNOSIS — C9000 Multiple myeloma not having achieved remission: Secondary | ICD-10-CM

## 2018-08-12 DIAGNOSIS — Z95828 Presence of other vascular implants and grafts: Secondary | ICD-10-CM

## 2018-08-12 DIAGNOSIS — G893 Neoplasm related pain (acute) (chronic): Secondary | ICD-10-CM

## 2018-08-12 DIAGNOSIS — Z79899 Other long term (current) drug therapy: Secondary | ICD-10-CM

## 2018-08-12 DIAGNOSIS — Z5112 Encounter for antineoplastic immunotherapy: Secondary | ICD-10-CM | POA: Diagnosis not present

## 2018-08-12 DIAGNOSIS — B369 Superficial mycosis, unspecified: Secondary | ICD-10-CM

## 2018-08-12 DIAGNOSIS — Z7982 Long term (current) use of aspirin: Secondary | ICD-10-CM | POA: Diagnosis not present

## 2018-08-12 DIAGNOSIS — Z7189 Other specified counseling: Secondary | ICD-10-CM

## 2018-08-12 LAB — CMP (CANCER CENTER ONLY)
ALT: 26 U/L (ref 0–44)
AST: 16 U/L (ref 15–41)
Albumin: 3.2 g/dL — ABNORMAL LOW (ref 3.5–5.0)
Alkaline Phosphatase: 83 U/L (ref 38–126)
Anion gap: 11 (ref 5–15)
BUN: 7 mg/dL — ABNORMAL LOW (ref 8–23)
CO2: 21 mmol/L — ABNORMAL LOW (ref 22–32)
Calcium: 8.7 mg/dL — ABNORMAL LOW (ref 8.9–10.3)
Chloride: 108 mmol/L (ref 98–111)
Creatinine: 0.66 mg/dL (ref 0.61–1.24)
GFR, Est AFR Am: >60 mL/min (ref >60)
GFR, Estimated: >60 mL/min (ref >60)
Glucose, Bld: 164 mg/dL — ABNORMAL HIGH (ref 70–99)
Potassium: 3.6 mmol/L (ref 3.5–5.1)
Sodium: 140 mmol/L (ref 135–145)
Total Bilirubin: 0.3 mg/dL (ref 0.3–1.2)
Total Protein: 6.1 g/dL — ABNORMAL LOW (ref 6.5–8.1)

## 2018-08-12 LAB — CBC WITH DIFFERENTIAL (CANCER CENTER ONLY)
Abs Immature Granulocytes: 0.01 10*3/uL (ref 0.00–0.07)
Basophils Absolute: 0 10*3/uL (ref 0.0–0.1)
Basophils Relative: 1 %
Eosinophils Absolute: 0.1 10*3/uL (ref 0.0–0.5)
Eosinophils Relative: 4 %
HCT: 32.5 % — ABNORMAL LOW (ref 39.0–52.0)
Hemoglobin: 10.7 g/dL — ABNORMAL LOW (ref 13.0–17.0)
Immature Granulocytes: 0 %
Lymphocytes Relative: 44 %
Lymphs Abs: 1 10*3/uL (ref 0.7–4.0)
MCH: 30.5 pg (ref 26.0–34.0)
MCHC: 32.9 g/dL (ref 30.0–36.0)
MCV: 92.6 fL (ref 80.0–100.0)
Monocytes Absolute: 0.3 10*3/uL (ref 0.1–1.0)
Monocytes Relative: 13 %
Neutro Abs: 0.9 10*3/uL — ABNORMAL LOW (ref 1.7–7.7)
Neutrophils Relative %: 38 %
Platelet Count: 108 10*3/uL — ABNORMAL LOW (ref 150–400)
RBC: 3.51 MIL/uL — ABNORMAL LOW (ref 4.22–5.81)
RDW: 17.2 % — ABNORMAL HIGH (ref 11.5–15.5)
WBC Count: 2.4 10*3/uL — ABNORMAL LOW (ref 4.0–10.5)
nRBC: 0 % (ref 0.0–0.2)

## 2018-08-12 MED ORDER — SODIUM CHLORIDE 0.9 % IV SOLN
20.0000 mg | Freq: Once | INTRAVENOUS | Status: AC
  Administered 2018-08-12: 11:00:00 20 mg via INTRAVENOUS
  Filled 2018-08-12: qty 20

## 2018-08-12 MED ORDER — CLOTRIMAZOLE 1 % EX OINT: 1.0000 "application " | TOPICAL_OINTMENT | Freq: Two times a day (BID) | CUTANEOUS | 1 refills | Status: AC

## 2018-08-12 MED ORDER — FAMOTIDINE IN NACL 20-0.9 MG/50ML-% IV SOLN
INTRAVENOUS | Status: AC
  Filled 2018-08-12: qty 50

## 2018-08-12 MED ORDER — ZOLEDRONIC ACID 4 MG/100ML IV SOLN
4.0000 mg | Freq: Once | INTRAVENOUS | Status: AC
  Administered 2018-08-12: 4 mg via INTRAVENOUS
  Filled 2018-08-12: qty 100

## 2018-08-12 MED ORDER — ACETAMINOPHEN 325 MG PO TABS
650.0000 mg | ORAL_TABLET | Freq: Once | ORAL | Status: AC
  Administered 2018-08-12: 650 mg via ORAL

## 2018-08-12 MED ORDER — DIPHENHYDRAMINE HCL 25 MG PO CAPS
50.0000 mg | ORAL_CAPSULE | Freq: Once | ORAL | Status: AC
  Administered 2018-08-12: 50 mg via ORAL

## 2018-08-12 MED ORDER — SODIUM CHLORIDE 0.9 % IV SOLN
15.5000 mg/kg | Freq: Once | INTRAVENOUS | Status: AC
  Administered 2018-08-12: 900 mg via INTRAVENOUS
  Filled 2018-08-12: qty 40

## 2018-08-12 MED ORDER — SODIUM CHLORIDE 0.9% FLUSH
10.0000 mL | INTRAVENOUS | Status: DC | PRN
  Administered 2018-08-12: 15:00:00 10 mL
  Filled 2018-08-12: qty 10

## 2018-08-12 MED ORDER — ACETAMINOPHEN 325 MG PO TABS
ORAL_TABLET | ORAL | Status: AC
  Filled 2018-08-12: qty 2

## 2018-08-12 MED ORDER — SODIUM CHLORIDE 0.9 % IV SOLN
Freq: Once | INTRAVENOUS | Status: AC
  Administered 2018-08-12: 10:00:00 via INTRAVENOUS
  Filled 2018-08-12: qty 250

## 2018-08-12 MED ORDER — SODIUM CHLORIDE 0.9% FLUSH
10.0000 mL | Freq: Once | INTRAVENOUS | Status: AC
  Administered 2018-08-12: 10 mL
  Filled 2018-08-12: qty 10

## 2018-08-12 MED ORDER — FAMOTIDINE IN NACL 20-0.9 MG/50ML-% IV SOLN
20.0000 mg | Freq: Once | INTRAVENOUS | Status: AC
  Administered 2018-08-12: 20 mg via INTRAVENOUS

## 2018-08-12 MED ORDER — DIPHENHYDRAMINE HCL 25 MG PO CAPS
ORAL_CAPSULE | ORAL | Status: AC
  Filled 2018-08-12: qty 2

## 2018-08-12 MED ORDER — HEPARIN SOD (PORK) LOCK FLUSH 100 UNIT/ML IV SOLN
500.0000 [IU] | Freq: Once | INTRAVENOUS | Status: AC | PRN
  Administered 2018-08-12: 500 [IU]
  Filled 2018-08-12: qty 5

## 2018-08-12 NOTE — Telephone Encounter (Signed)
Per 7/10 los F/u as per scheduled appointment for 7/24.

## 2018-08-12 NOTE — Patient Instructions (Signed)

## 2018-08-12 NOTE — Patient Instructions (Signed)
[facility] Discharge Instructions for Patients Receiving Chemotherapy  Today you received the following chemotherapy agents Daratumumab (DARZALEX).  To help prevent nausea and vomiting after your treatment, we encourage you to take your nausea medication as prescribed.  If you develop nausea and vomiting that is not controlled by your nausea medication, call the clinic.   BELOW ARE SYMPTOMS THAT SHOULD BE REPORTED IMMEDIATELY:  *FEVER GREATER THAN 100.5 F  *CHILLS WITH OR WITHOUT FEVER  NAUSEA AND VOMITING THAT IS NOT CONTROLLED WITH YOUR NAUSEA MEDICATION  *UNUSUAL SHORTNESS OF BREATH  *UNUSUAL BRUISING OR BLEEDING  TENDERNESS IN MOUTH AND THROAT WITH OR WITHOUT PRESENCE OF ULCERS  *URINARY PROBLEMS  *BOWEL PROBLEMS  UNUSUAL RASH Items with * indicate a potential emergency and should be followed up as soon as possible.  Feel free to call the clinic should you have any questions or concerns. The clinic phone number is [phone].  Please show the CHEMO ALERT CARD at check-in to the Emergency Department and triage nurse.  Coronavirus (COVID-19) Are you at risk?  Are you at risk for the Coronavirus (COVID-19)?  To be considered HIGH RISK for Coronavirus (COVID-19), you have to meet the following criteria:  . Traveled to [place], [place], [place], [place] or [place]; or in the [place] to [place], [place], [place], or [place]; and have fever, cough, and shortness of breath within the last 2 weeks of travel OR . Been in close contact with a person diagnosed with COVID-19 within the last 2 weeks and have fever, cough, and shortness of breath . IF YOU DO NOT MEET THESE CRITERIA, YOU ARE CONSIDERED LOW RISK FOR COVID-19.  What to do if you are HIGH RISK for COVID-19?  . If you are having a medical emergency, call 911. . Seek medical care right away. Before you go to a doctor's office, urgent care or emergency department, call ahead and tell them  about your recent travel, contact with someone diagnosed with COVID-19, and your symptoms. You should receive instructions from your physician's office regarding next steps of care.  . When you arrive at healthcare provider, tell the healthcare staff immediately you have returned from visiting [place], [place], [place], [place] or [place]; or traveled in the [place] to [place], [place], [place], or [place]; in the last two weeks or you have been in close contact with a person diagnosed with COVID-19 in the last 2 weeks.   . Tell the health care staff about your symptoms: fever, cough and shortness of breath. . After you have been seen by a medical provider, you will be either: o Tested for (COVID-19) and discharged home on quarantine except to seek medical care if symptoms worsen, and asked to  - Stay home and avoid contact with others until you get your results (4-5 days)  - Avoid travel on public transportation if possible (such as bus, train, or airplane) or o Sent to the Emergency Department by EMS for evaluation, COVID-19 testing, and possible admission depending on your condition and test results.  What to do if you are LOW RISK for COVID-19?  Reduce your risk of any infection by using the same precautions used for avoiding the common cold or flu:  . Wash your hands often with soap and warm water for at least 20 seconds.  If soap and water are not readily available, use an alcohol-based hand sanitizer with at least 60% alcohol.  . If coughing or sneezing,   sneezing, cover your mouth and nose by coughing or sneezing into the elbow areas of your shirt or coat, into a tissue or into your sleeve (not your hands). . Avoid shaking hands with others and consider head nods or verbal greetings only. . Avoid touching your eyes, nose, or mouth with unwashed hands.  . Avoid close contact with people who are sick. . Avoid places or events with large numbers of people in one location, like concerts or  sporting events. . Carefully consider travel plans you have or are making. . If you are planning any travel outside or inside the [place], visit the CDC's Travelers' Health webpage for the latest health notices. . If you have some symptoms but not all symptoms, continue to monitor at home and seek medical attention if your symptoms worsen. . If you are having a medical emergency, call 911.   [place] / e-Visit: [email or web address]         [facility]: [facility]: [phone]                   [facility]: [phone]

## 2018-08-12 NOTE — Progress Notes (Signed)
Per Dr. Irene Limbo verbal order: Ok to treat today with ANC 0.9

## 2018-08-15 ENCOUNTER — Ambulatory Visit: Payer: 59

## 2018-08-24 ENCOUNTER — Telehealth: Payer: Self-pay | Admitting: Radiation Oncology

## 2018-08-24 NOTE — Telephone Encounter (Signed)
  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  Interval Since Last Radiation:  5 weeks   07/11/2018-07/22/2018:  30 Gy in 10 fractions to the T spine, Right Scapula, Right Femur, and Left Hip.  08/19/16-08/24/16:  Thoracic spine levels T9-T11 received 12 Gy. He was planning to receive 30 Gy in 10 fractions but this was inturuppted by emergent surgery due to cord compression of T10. He did not complete his course. He was treated in [place] with Dr. Lysle Morales.  Narrative:  The patient was contacted today for routine follow-up. During treatment he did very well with radiotherapy and did not have significant desquamation. He did have some aches in his stomach which has improved, but is still noticeable from time to time. He did notice modest improvement with carafate.  Impression/Plan: 1. 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 The patient has been doing well since completion of radiotherapy and pain has improved in the treated sites. We discussed that we would be happy to continue to follow him as needed, but he will also continue to follow up with Dr. Irene Limbo in medical oncology.  2. 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 The patient has had improvement with carafate but has been taking it whole. I instructed him on making a slurry with water and crushing the tablet in the water. He will try this and let us know if he needs any refills.    Carola Rhine, PAC

## 2018-08-25 NOTE — Progress Notes (Signed)
HEMATOLOGY/ONCOLOGY CLINIC NOTE  Date of Service: 08/26/2018  Patient Care Team: Patient, No Pcp Per as PCP - General (General Practice)  CHIEF COMPLAINTS/PURPOSE OF CONSULTATION:   Continued management of Multiple Myeloma  Oncologic History:   Jesus [name] was diagnosed with multiple myeloma in July 2018 in [place]. He initially developed mid back pain in May 2018 and was seen to have a T10 vertebral body mass with additional lytic lesion at T9. His initial M spike in July 2018 was 1.5g with IgG Lambda specificity and IgG elevated at 2259m. His July 2018 BM Bx revealed 20% monoclonal plasma cells and FISH revealed a 17p deletion. He received 4 fractions of palliative RT, however his back pain worsened and developed bilateral lower extremity numbness and tingling, and was then seen to have a T10 pathologic fracture with cord compression s/p T10 corpectomy. The pt began VRd on 09/24/16, completed 7 cycles, then [facility], post transplant complicated by neutropenic colitis and deconditioning. Repeat BM Bx on 04/21/17 revealed residual plasma cell with less than 5% lambda light chain restricted plasma cells, and a post-transplant M spike of 0.4g. The pt then began maintenance 539mm2 Carfilzomib every 2 weeks on 06/16/17.  HISTORY OF PRESENTING ILLNESS:   Jesus NOCERAs a wonderful [age].o. male who has been referred to usKoreay Dr. BePhil Doppor evaluation and management of Multiple Myeloma. The pt reports that he is doing well overall.  The [place], the pt has moved back to his home which is here locally, and is transferring his care here now as well. The pt notes that he is anticipating C11D1 maintenance Carfilzomib today. The pt notes that his most recent M  spike was 0.2g. His last BM biopsy was March 2019, as noted above. He denies any infection issues in the last year. The pt notes that he has received all of is post-transplant vaccinations. [facility]  The pt reports that he continues to have back pain. He has been receiving Zometa every 4 weeks, but was recently transitioned to every 3 months, he denies dental concerns at this time. He is taking Calcium and Vitamin D replacement.   The pt has been taking Marinol for appetite stimulation.   The pt notes that he has an enlarged prostate, which was seen on imaging. He notes that he was expecting to receive a PSA test soon.  The pt notes that prior to his Multiple Myeloma diagnosis in July 2018, he had a "clean bill of health." He denies DM, HTN, lung problems, heart problems, thyroid problems or other medical concerns.  Most recent lab results (03/23/18) of CBC is as follows: all values are WNL except for RBC at 4.03, HGB at 11.5, HCT at 35.6%, MPV at 8.9, Glucose at 135, Total Protein at 6.2.  On review of systems, pt reports chronic back pain, stable energy levels, and denies concerns for infections, new pain along the spine, abdominal pains, leg swelling, and any other symptoms.   On PMHx the pt reports Multiple Myeloma, denies HTN, HLD, lung problems, strokes, seizures, heart problems and thyroid problems. On Social Hx the pt reports working for the [employer] development [employer] Interval History:  Jesus [name] returns today for management and evaluation of his multiple myeloma not in remission. The patient's last visit with [place] was on 08/12/2018. He is here for C3D15 of Daratumuab, 56m Pomalyst and Dexamethasone. The pt reports that he is doing well overall.  The pt reports that he saw Dr. RNorma Fredricksonon Monday 08/22/2018. He does not recommend another transplant and did not mention any current clinical trials. Recommended completing treatment  and then re-evaluating. Pt notes that he had another bone marrow biopsy on Monday.  His left hip pain has improved. He reports some new intermittent pain in his legs below the knees and toes that is worse at night. He describes the pain as sharp and shooting and says that it is worse in his left leg. He takes two Tylenol before bed, which usually helps. He is currently in his first week of Pomalidamide, which he started on Tuesday.  Lab results today (08/26/2018) of CBC w/diff and CMP is as follows: all values are WNL except for WBC count at 2.5k, RBC at 3.46, HGB at 10.7, HCT at 32.0, RDW at 17.1, PLT at 51k, Potassium at 3.4, glucose bld at 129, Calcium at 8.7, Total protein at 6.3, and AST at 11.  On review of systems, pt reports leg pain, some left hip pain that has improved and denies fever, chills, back pain and any other symptoms.   MEDICAL HISTORY:  Past Medical History:  Diagnosis Date  . Cancer ([facility]     SURGICAL HISTORY: Past Surgical History:  Procedure Laterality Date  . BACK SURGERY    . IR IMAGING GUIDED PORT INSERTION  06/21/2018    SOCIAL HISTORY: Social History   Socioeconomic History  . Marital status: Married    Spouse name: Not on file  . Number of children: Not on file  . Years of education: Not on file  . Highest education level: Not on file  Occupational History  . Not on file  Social Needs  . Financial resource strain: Not on file  . Food insecurity    Worry: Not on file    Inability: Not on file  . Transportation needs    Medical: No    Non-medical: No  Tobacco Use  . Smoking status: Never Smoker  . Smokeless tobacco: Never Used  Substance and Sexual Activity  . Alcohol use: Never    Frequency: Never  . Drug use: Never  . Sexual activity: Not on file  Lifestyle  . Physical activity    Days per week: Not on file    Minutes per session: Not on file  . Stress: Not on file  Relationships  . Social [place] phone: Not on file     Gets together: Not on file    Attends religious service: Not on file    Active member of club or organization: Not on file    Attends meetings of clubs or organizations: Not on file    Relationship status: Not on file  . Intimate partner violence    Fear of current or ex partner: Not on file    Emotionally abused: Not on file    Physically abused: Not on file    Forced sexual activity: Not on file  Other Topics Concern  . Not on file  Social History Narrative  . Not on file    FAMILY HISTORY: Family History  Problem Relation Age of Onset  . Breast cancer Sister     ALLERGIES:  is allergic to penicillins.  MEDICATIONS:  Current Outpatient Medications  Medication Sig Dispense Refill  . acetaminophen (TYLENOL) 325 MG tablet Take by mouth.    [name] acyclovir (ZOVIRAX) 400 MG tablet Take 1 tablet (400 mg total) by mouth 2 (two) times daily. 60 tablet 2  . acyclovir (ZOVIRAX) 400 MG tablet Take 1 tablet (400 mg total) by mouth 2 (two) times daily. 60 tablet 11  . aspirin 81 MG EC tablet Take by mouth.    [name] atovaquone-proguanil (MALARONE) 250-100 MG TABS tablet TAKE 1 TABLET BY MOUTH ONCE DAILY FOR 25 DAYS BEGIN 1 2 DAYS BEFORE AND CONTINUE UNTIL 1 WEEK AFTER EXPOSURE FOR PREVENTION OF MALARIA    . buPROPion (WELLBUTRIN SR) 150 MG 12 hr tablet 1 pill a day for 3 days.  Then BID    . Calcium Carb-Cholecalciferol (CALCIUM 1000 + D) 1000-800 MG-UNIT TABS Take 1,000 mg by mouth once.    . Cholecalciferol (VITAMIN D3) 25 MCG (1000 UT) CAPS Take by mouth.    . ciprofloxacin (CIPRO) 500 MG tablet TAKE 1 TABLET BY MOUTH TWICE DAILY FOR 5 DAYS MAY TAKE FOR 3 5 DAYS FOR TRAVELERS DIARRHEA MAY DISCONTINUE WHEN SYMPTOMS RESOLVED    . Clotrimazole 1 % OINT Apply 1 application topically 2 (two) times a day for 14 days. 56.7 g 1  . dexamethasone (DECADRON) 4 MG tablet Take 5 tablets (32m) with breakfast the day after each dose of Daratumumab 120 tablet 1  . doxycycline (VIBRA-TABS) 100 MG tablet Take  by mouth.    . dronabinol (MARINOL) 10 MG capsule Take 1 capsule (10 mg total) by mouth 2 (two) times daily before a meal. 60 capsule 0  . gabapentin (NEURONTIN) 300 MG capsule Take 1 capsule (300 mg total) by mouth 3 (three) times daily. 90 capsule 2  . lidocaine-prilocaine (EMLA) cream Apply to affected area once 30 g 3  . LORazepam (ATIVAN) 0.5 MG tablet Take by mouth.    . morphine (MS CONTIN) 15 MG 12 hr tablet Take 1 tablet (15 mg total) by mouth every 12 (twelve) hours. 60 tablet 0  . Multiple Vitamins-Minerals (ONE DAILY CALCIUM/IRON) TABS Take by mouth.    . ondansetron (ZOFRAN) 4 MG tablet Take 1 tablet (4 mg total) by mouth every 8 (eight) hours as needed for nausea or vomiting. 30 tablet 0  . ondansetron (ZOFRAN) 8 MG tablet Take 1 tablet (8 mg total) by mouth 2 (two) times daily as needed (Nausea or vomiting). 30 tablet 1  . ondansetron (ZOFRAN-ODT) 4 MG disintegrating tablet Place under the tongue.    .Marland KitchenoxyCODONE (OXY IR/ROXICODONE) 5 MG immediate release tablet Take 1 tablet (5 mg total) by mouth every 4 (four) hours as needed for severe pain. 120 tablet 0  . pomalidomide (POMALYST) 2 MG capsule TAKE 1 CAPSULE BY MOUTH  DAILY FOR 21 DAYS, THEN 7  DAYS OFF - TAKE WITH WATER 21 capsule 0  . prochlorperazine (COMPAZINE) 10 MG tablet Take 1 tablet (10 mg total) by mouth every 6 (six) hours as needed (Nausea or vomiting). 30 tablet 1  . senna-docusate (SENOKOT-S) 8.6-50 MG tablet Take by mouth.    . sucralfate (CARAFATE) 1 g tablet Take 1 tablet (1 g total) by mouth 4 (four) times daily. 120 tablet 0  . traMADol (ULTRAM) 50 MG tablet TAKE 1 TABLET BY MOUTH EVERY 6 HOURS AS NEEDED FOR MODERATE PAIN OR SEVERE PAIN     No current facility-administered medications for this visit.  REVIEW OF SYSTEMS:    A 10+ POINT REVIEW OF SYSTEMS WAS OBTAINED including neurology, dermatology, psychiatry, cardiac, respiratory, lymph, extremities, GI, GU, Musculoskeletal, constitutional, breasts,  reproductive, HEENT.  All pertinent positives are noted in the HPI.  All others are negative.   PHYSICAL EXAMINATION: ECOG PERFORMANCE STATUS: 1 - Symptomatic but completely ambulatory  Vitals:   08/26/18 0859  BP: (!) 104/49  Pulse: 71  Resp: 18  Temp: 98.5 F (36.9 C)  SpO2: 100%   Filed Weights   08/26/18 0859  Weight: 122 lb 1.6 oz (55.4 kg)   .Body mass index is 18.84 kg/m.  GENERAL:alert, in no acute distress and comfortable SKIN: right axillary hypopigmentation EYES: conjunctiva are pink and non-injected, sclera anicteric OROPHARYNX: MMM, no exudates, no oropharyngeal erythema or ulceration NECK: supple, no JVD LYMPH:  no palpable lymphadenopathy in the cervical, axillary or inguinal regions LUNGS: clear to auscultation b/l with normal respiratory effort HEART: regular rate & rhythm ABDOMEN:  normoactive bowel sounds , non tender, not distended. No palpable hepatosplenomegaly.  Extremity: no pedal edema PSYCH: alert & oriented x 3 with fluent speech NEURO: no focal motor/sensory deficits   LABORATORY DATA:  I have reviewed the data as listed  . CBC Latest Ref Rng & Units 08/26/2018 08/12/2018 08/04/2018  WBC 4.0 - 10.5 K/uL 2.5(L) 2.4(L) 2.7(L)  Hemoglobin 13.0 - 17.0 g/dL 10.7(L) 10.7(L) 10.2(L)  Hematocrit 39.0 - 52.0 % 32.0(L) 32.5(L) 31.1(L)  Platelets 150 - 400 K/uL 51(L) 108(L) 123(L)    . CMP Latest Ref Rng & Units 08/26/2018 08/12/2018 08/04/2018  Glucose 70 - 99 mg/dL 129(H) 164(H) 102(H)  BUN 8 - 23 mg/dL 9 7(L) 8  Creatinine 0.61 - 1.24 mg/dL 0.64 0.66 0.74  Sodium 135 - 145 mmol/L 140 140 138  Potassium 3.5 - 5.1 mmol/L 3.4(L) 3.6 4.1  Chloride 98 - 111 mmol/L 108 108 106  CO2 22 - 32 mmol/L 23 21(L) 24  Calcium 8.9 - 10.3 mg/dL 8.7(L) 8.7(L) 9.0  Total Protein 6.5 - 8.1 g/dL 6.3(L) 6.1(L) 6.1(L)  Total Bilirubin 0.3 - 1.2 mg/dL 0.3 0.3 0.5  Alkaline Phos 38 - 126 U/L 79 83 74  AST 15 - 41 U/L 11(L) 16 16  ALT 0 - 44 U/L '14 26 25    ' 06/30/18 BM  Biopsy:    RADIOGRAPHIC STUDIES: I have personally reviewed the radiological images as listed and agreed with the findings in the report. No results found.  ASSESSMENT & PLAN:  63 y.o. male with  1. 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 IgG at 2220. 08/11/16 PET/CT revealed Hypermetabolic large soft tissue mass in the lower thoracic paraspinal region associated with lytic destruction of T10 vertebral body, concerning for multiple myeloma. Additional smaller lytic lesions involving the skeleton. S/p RT x 4 fractions, discontinued due to T10 pathologic fracture with severe cord compression S/p 08/26/16 T10 corpectomy and T8-L1 posterior spinal fusion   Began 7 cycles of VRd on 09/24/16 S/p autologous stem cell transplant, Day 0 on 02/23/17 04/21/17 BM Bx with residual less than 5% lambda light chain restricted plasma cells in the bone marrow. M Spike at 0.4g Began maintenance 57m/m2 Carfilzomib every 2 weeks on 06/16/17  06/09/18 PET/CT revealed "Multifocal hypermetabolic lytic lesions in the skeleton as noted above, compatible with active myeloma. In the  spine, the most notable active lesion of concern is at the T12 level with there is a large right eccentric vertebral body lesion with demineralization of the cortex and likely some paravertebral extension of tumor. This could cause loosening of the right pedicle screw. Intraspinal extension of tumor is difficult to exclude. MRI might be considered although the posterolateral rod and pedicle screws may cause artifact. 2. There is likewise cortical breakthrough associated with the lytic lesion along the left upper acetabulum. 3. A lesion in the left intertrochanteric area of the femur is large enough to potentially cause biomechanical weakening which increases risk  of fracture. 4. Other skeletal sites of active involvement are detailed above in the skeleton section. 5. Several lucent lesions are observed including the calvarium, T1 vertebral body, and left L4 pedicle which are not hypermetabolic and may represent effectively previously treated lesions. 6. 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 There is also a lipoma along the right brachialis muscle. 7. Nonspecific subcutaneous edema especially in the right forearm but also extending up into the right upper arm. Cause is uncertain. There is some low-grade nonfocal metabolic activity along this area. Strictly speaking, cellulitis is not excluded, correlate with clinical history and visual inspection. 8. Other imaging findings of potential clinical significance: Aortic Atherosclerosis. Mild cardiomegaly. Cholelithiasis. Prostatomegaly."  06/17/18 MMP revealed M Protein at 0.5g, just before beginning C1 Daratumumab  06/30/18 BM Biopsy revealed normocellular marrow with minimal involvement by plasma cell neoplasm (5% plasma cells), normal cytogenetics. 06/30/18 Molecular Cytogenetics did not have enough material for testing.  PLAN: -Discussed pt labwork today, 08/26/2018; PLTs are low -Discuss that his current treatment does not usually cause neuropathy, but that this leg pain could be due to a pinched nerve.  -Recommend that if leg pain worsens, we can try Gabapentin, which he has tolerated well before. -The pt has no prohibitive toxicities from beginning C3D15 Daratumuab -Hold Pomalidomide until labs are repeated in 2 weeks to monitor interval improvement in [facility] -Would recommend continuing treatment for 4-6 cycles before repeating BM and PET/CT -[facility]. Blinda Leatherwood [facility] does not recommend a repeat transplant at this time. He would like the pt to finish treatment, then will re-evaluate. -Continue monthly Zometa given active disease -Continue  1-2 tablets of 67m Oxycodone  -MS Contin 128mpo q12h -Refill Rx Marinol  -F/U with labs in 2 weeks   F/u as per scheduled appointments for next 2 treatments   All of the patients questions were answered with apparent satisfaction. The patient knows to call the clinic with any problems, questions or concerns.  The total time spent in the appt was 2583mtes and more than 50% was on counseling and direct patient cares.    GauSullivan Lone MS AAHIVMS SCHPlatte Health CenterHWest Wich[facility] Physicians Pamatology/Oncology Physician ConSt David'S Georgetown HospitalOffice):       [phone] cell):  [phone]):           [phone]/24/2020 9:59 AM  I, EmiDe Burrsm acting as a scribe for Dr. KalIrene LimboI have reviewed the above documentation for accuracy and completeness, and I agree with the above. .GaBrunetta Genera

## 2018-08-26 ENCOUNTER — Inpatient Hospital Stay: Payer: 59

## 2018-08-26 ENCOUNTER — Inpatient Hospital Stay (HOSPITAL_BASED_OUTPATIENT_CLINIC_OR_DEPARTMENT_OTHER): Payer: 59 | Admitting: Hematology

## 2018-08-26 ENCOUNTER — Telehealth: Payer: Self-pay | Admitting: Hematology

## 2018-08-26 ENCOUNTER — Other Ambulatory Visit: Payer: Self-pay

## 2018-08-26 VITALS — BP 104/49 | HR 71 | Temp 98.5°F | Resp 18 | Ht 67.5 in | Wt 122.1 lb

## 2018-08-26 VITALS — BP 111/68 | HR 73 | Temp 98.1°F | Resp 17

## 2018-08-26 DIAGNOSIS — T451X5A Adverse effect of antineoplastic and immunosuppressive drugs, initial encounter: Secondary | ICD-10-CM

## 2018-08-26 DIAGNOSIS — N400 Enlarged prostate without lower urinary tract symptoms: Secondary | ICD-10-CM

## 2018-08-26 DIAGNOSIS — Z95828 Presence of other vascular implants and grafts: Secondary | ICD-10-CM

## 2018-08-26 DIAGNOSIS — G893 Neoplasm related pain (acute) (chronic): Secondary | ICD-10-CM | POA: Diagnosis not present

## 2018-08-26 DIAGNOSIS — Z7982 Long term (current) use of aspirin: Secondary | ICD-10-CM

## 2018-08-26 DIAGNOSIS — Z5112 Encounter for antineoplastic immunotherapy: Secondary | ICD-10-CM

## 2018-08-26 DIAGNOSIS — C9000 Multiple myeloma not having achieved remission: Secondary | ICD-10-CM

## 2018-08-26 DIAGNOSIS — Z7189 Other specified counseling: Secondary | ICD-10-CM

## 2018-08-26 DIAGNOSIS — Z79899 Other long term (current) drug therapy: Secondary | ICD-10-CM

## 2018-08-26 DIAGNOSIS — D701 Agranulocytosis secondary to cancer chemotherapy: Secondary | ICD-10-CM

## 2018-08-26 LAB — CBC WITH DIFFERENTIAL (CANCER CENTER ONLY)
Abs Immature Granulocytes: 0.01 10*3/uL (ref 0.00–0.07)
Basophils Absolute: 0 10*3/uL (ref 0.0–0.1)
Basophils Relative: 0 %
Eosinophils Absolute: 0.1 10*3/uL (ref 0.0–0.5)
Eosinophils Relative: 4 %
HCT: 32 % — ABNORMAL LOW (ref 39.0–52.0)
Hemoglobin: 10.7 g/dL — ABNORMAL LOW (ref 13.0–17.0)
Immature Granulocytes: 0 %
Lymphocytes Relative: 47 %
Lymphs Abs: 1.2 10*3/uL (ref 0.7–4.0)
MCH: 30.9 pg (ref 26.0–34.0)
MCHC: 33.4 g/dL (ref 30.0–36.0)
MCV: 92.5 fL (ref 80.0–100.0)
Monocytes Absolute: 0.3 10*3/uL (ref 0.1–1.0)
Monocytes Relative: 11 %
Neutro Abs: 0.9 10*3/uL — ABNORMAL LOW (ref 1.7–7.7)
Neutrophils Relative %: 38 %
Platelet Count: 51 10*3/uL — ABNORMAL LOW (ref 150–400)
RBC: 3.46 MIL/uL — ABNORMAL LOW (ref 4.22–5.81)
RDW: 17.1 % — ABNORMAL HIGH (ref 11.5–15.5)
WBC Count: 2.5 10*3/uL — ABNORMAL LOW (ref 4.0–10.5)
nRBC: 0 % (ref 0.0–0.2)

## 2018-08-26 LAB — CMP (CANCER CENTER ONLY)
ALT: 14 U/L (ref 0–44)
AST: 11 U/L — ABNORMAL LOW (ref 15–41)
Albumin: 3.5 g/dL (ref 3.5–5.0)
Alkaline Phosphatase: 79 U/L (ref 38–126)
Anion gap: 9 (ref 5–15)
BUN: 9 mg/dL (ref 8–23)
CO2: 23 mmol/L (ref 22–32)
Calcium: 8.7 mg/dL — ABNORMAL LOW (ref 8.9–10.3)
Chloride: 108 mmol/L (ref 98–111)
Creatinine: 0.64 mg/dL (ref 0.61–1.24)
GFR, Est AFR Am: >60 mL/min (ref >60)
GFR, Estimated: >60 mL/min (ref >60)
Glucose, Bld: 129 mg/dL — ABNORMAL HIGH (ref 70–99)
Potassium: 3.4 mmol/L — ABNORMAL LOW (ref 3.5–5.1)
Sodium: 140 mmol/L (ref 135–145)
Total Bilirubin: 0.3 mg/dL (ref 0.3–1.2)
Total Protein: 6.3 g/dL — ABNORMAL LOW (ref 6.5–8.1)

## 2018-08-26 MED ORDER — SODIUM CHLORIDE 0.9 % IV SOLN
Freq: Once | INTRAVENOUS | Status: AC
  Administered 2018-08-26: 10:00:00 via INTRAVENOUS
  Filled 2018-08-26: qty 250

## 2018-08-26 MED ORDER — DIPHENHYDRAMINE HCL 25 MG PO CAPS
ORAL_CAPSULE | ORAL | Status: AC
  Filled 2018-08-26: qty 2

## 2018-08-26 MED ORDER — FAMOTIDINE IN NACL 20-0.9 MG/50ML-% IV SOLN
20.0000 mg | Freq: Once | INTRAVENOUS | Status: AC
  Administered 2018-08-26: 20 mg via INTRAVENOUS

## 2018-08-26 MED ORDER — SODIUM CHLORIDE 0.9% FLUSH
10.0000 mL | INTRAVENOUS | Status: DC | PRN
  Administered 2018-08-26: 10 mL
  Filled 2018-08-26: qty 10

## 2018-08-26 MED ORDER — SODIUM CHLORIDE 0.9% FLUSH
10.0000 mL | Freq: Once | INTRAVENOUS | Status: AC
  Administered 2018-08-26: 10 mL
  Filled 2018-08-26: qty 10

## 2018-08-26 MED ORDER — SODIUM CHLORIDE 0.9 % IV SOLN
20.0000 mg | Freq: Once | INTRAVENOUS | Status: AC
  Administered 2018-08-26: 20 mg via INTRAVENOUS
  Filled 2018-08-26: qty 20

## 2018-08-26 MED ORDER — ACETAMINOPHEN 325 MG PO TABS
650.0000 mg | ORAL_TABLET | Freq: Once | ORAL | Status: AC
  Administered 2018-08-26: 650 mg via ORAL

## 2018-08-26 MED ORDER — SODIUM CHLORIDE 0.9 % IV SOLN
15.5000 mg/kg | Freq: Once | INTRAVENOUS | Status: AC
  Administered 2018-08-26: 900 mg via INTRAVENOUS
  Filled 2018-08-26: qty 40

## 2018-08-26 MED ORDER — FAMOTIDINE IN NACL 20-0.9 MG/50ML-% IV SOLN
INTRAVENOUS | Status: AC
  Filled 2018-08-26: qty 50

## 2018-08-26 MED ORDER — DIPHENHYDRAMINE HCL 25 MG PO CAPS
50.0000 mg | ORAL_CAPSULE | Freq: Once | ORAL | Status: AC
  Administered 2018-08-26: 50 mg via ORAL

## 2018-08-26 MED ORDER — ACETAMINOPHEN 325 MG PO TABS
ORAL_TABLET | ORAL | Status: AC
  Filled 2018-08-26: qty 2

## 2018-08-26 MED ORDER — HEPARIN SOD (PORK) LOCK FLUSH 100 UNIT/ML IV SOLN
500.0000 [IU] | Freq: Once | INTRAVENOUS | Status: AC | PRN
  Administered 2018-08-26: 500 [IU]
  Filled 2018-08-26: qty 5

## 2018-08-26 NOTE — Telephone Encounter (Signed)
Per 7/24 los F/u as per scheduled appointments for next 2 treatments.

## 2018-08-26 NOTE — Progress Notes (Signed)
Per Dr. Irene Limbo: OK to treat with ANC of 0.9 and platelets of 51

## 2018-08-26 NOTE — Patient Instructions (Signed)
[facility] Discharge Instructions for Patients Receiving Chemotherapy  Today you received the following chemotherapy agents: Daratumumab (Darzalex)  To help prevent nausea and vomiting after your treatment, we encourage you to take your nausea medication as directed.   If you develop nausea and vomiting that is not controlled by your nausea medication, call the clinic.   BELOW ARE SYMPTOMS THAT SHOULD BE REPORTED IMMEDIATELY:  *FEVER GREATER THAN 100.5 F  *CHILLS WITH OR WITHOUT FEVER  NAUSEA AND VOMITING THAT IS NOT CONTROLLED WITH YOUR NAUSEA MEDICATION  *UNUSUAL SHORTNESS OF BREATH  *UNUSUAL BRUISING OR BLEEDING  TENDERNESS IN MOUTH AND THROAT WITH OR WITHOUT PRESENCE OF ULCERS  *URINARY PROBLEMS  *BOWEL PROBLEMS  UNUSUAL RASH Items with * indicate a potential emergency and should be followed up as soon as possible.  Feel free to call the clinic should you have any questions or concerns. The clinic phone number is [phone].  Please show the CHEMO ALERT CARD at check-in to the Emergency Department and triage nurse.  Coronavirus (COVID-19) Are you at risk?  Are you at risk for the Coronavirus (COVID-19)?  To be considered HIGH RISK for Coronavirus (COVID-19), you have to meet the following criteria:  . Traveled to [place], [place], [place], [place] or [place]; or in the [place] to [place], [place], [place], or [place]; and have fever, cough, and shortness of breath within the last 2 weeks of travel OR . Been in close contact with a person diagnosed with COVID-19 within the last 2 weeks and have fever, cough, and shortness of breath . IF YOU DO NOT MEET THESE CRITERIA, YOU ARE CONSIDERED LOW RISK FOR COVID-19.  What to do if you are HIGH RISK for COVID-19?  . If you are having a medical emergency, call 911. . Seek medical care right away. Before you go to a doctor's office, urgent care or emergency department, call ahead and tell them  about your recent travel, contact with someone diagnosed with COVID-19, and your symptoms. You should receive instructions from your physician's office regarding next steps of care.  . When you arrive at healthcare provider, tell the healthcare staff immediately you have returned from visiting [place], [place], [place], [place] or [place]; or traveled in the [place] to [place], [place], [place], or [place]; in the last two weeks or you have been in close contact with a person diagnosed with COVID-19 in the last 2 weeks.   . Tell the health care staff about your symptoms: fever, cough and shortness of breath. . After you have been seen by a medical provider, you will be either: o Tested for (COVID-19) and discharged home on quarantine except to seek medical care if symptoms worsen, and asked to  - Stay home and avoid contact with others until you get your results (4-5 days)  - Avoid travel on public transportation if possible (such as bus, train, or airplane) or o Sent to the Emergency Department by EMS for evaluation, COVID-19 testing, and possible admission depending on your condition and test results.  What to do if you are LOW RISK for COVID-19?  Reduce your risk of any infection by using the same precautions used for avoiding the common cold or flu:  . Wash your hands often with soap and warm water for at least 20 seconds.  If soap and water are not readily available, use an alcohol-based hand sanitizer with at least 60% alcohol.  . If coughing or   sneezing, cover your mouth and nose by coughing or sneezing into the elbow areas of your shirt or coat, into a tissue or into your sleeve (not your hands). . Avoid shaking hands with others and consider head nods or verbal greetings only. . Avoid touching your eyes, nose, or mouth with unwashed hands.  . Avoid close contact with people who are sick. . Avoid places or events with large numbers of people in one location, like concerts or  sporting events. . Carefully consider travel plans you have or are making. . If you are planning any travel outside or inside the [place], visit the CDC's Travelers' Health webpage for the latest health notices. . If you have some symptoms but not all symptoms, continue to monitor at home and seek medical attention if your symptoms worsen. . If you are having a medical emergency, call 911.   [place] / e-Visit: [email or web address]         [facility]: [facility]: [phone]                   [facility]: [phone]

## 2018-09-02 ENCOUNTER — Telehealth: Payer: Self-pay | Admitting: Hematology

## 2018-09-02 NOTE — Telephone Encounter (Signed)
[place] CME 8/7 f/u cxd, keep lab/tx. Confirmed with patient.

## 2018-09-06 NOTE — Progress Notes (Signed)
  Radiation Oncology         (336) [phone] ________________________________  Name: Jesus [name] MRN: [id number]  Date: 07/22/2018  DOB: 15-Apr-1955  End of Treatment Note  Diagnosis:   63 y.o. male with Progressive Multiple Myeloma s/p Stem Cell Transplant with painful bony lesions   Indication for treatment:  Palliative       Radiation treatment dates:   07/11/2018 - 07/22/2018  Site/dose:    1. Thoracic Spine (T12) / 25 Gy in 10 fractions 2. Left Hip / 30 Gy in 10 fractions 3. Right Femur / 30 Gy in 10 fractions 4. Right Scapula / 30 Gy in 10 fractions  Beams/energy:    1. Isodose Plan / 10X, 6X Photon 2. Isodose Plan / 15X Photon 3. Isodose Plan / 15X Photon 4. Isodose Plan / 15X, 6X Photon  Narrative: The patient tolerated radiation treatment relatively well.  He experienced mild fatigue and some esophagitis; prescription for Carafate given. He denied any numbness or tingling in his extremities. He denied any swelling. He denied nausea or vomiting. He denied any issues with his skin. Overall, states his pain is better.  Plan: The patient has completed radiation treatment. The patient will return to radiation oncology clinic for routine followup in one month. I advised them to call or return sooner if they have any questions or concerns related to their recovery or treatment.  ------------------------------------------------  Jodelle Gross, MD, PhD  This document serves as a record of services personally performed by Kyung Rudd, MD. It was created on his behalf by Rae Lips, a trained medical scribe. The creation of this record is based on the scribe's personal observations and the provider's statements to them. This document has been checked and approved by the attending provider.

## 2018-09-08 ENCOUNTER — Other Ambulatory Visit: Payer: Self-pay | Admitting: Hematology

## 2018-09-09 ENCOUNTER — Other Ambulatory Visit: Payer: Self-pay

## 2018-09-09 ENCOUNTER — Ambulatory Visit: Payer: 59 | Admitting: Hematology

## 2018-09-09 ENCOUNTER — Inpatient Hospital Stay: Payer: 59

## 2018-09-09 ENCOUNTER — Inpatient Hospital Stay: Payer: 59 | Attending: Hematology

## 2018-09-09 VITALS — BP 118/74 | HR 73 | Temp 98.4°F | Resp 16 | Ht 67.5 in | Wt 121.8 lb

## 2018-09-09 DIAGNOSIS — Z79899 Other long term (current) drug therapy: Secondary | ICD-10-CM | POA: Insufficient documentation

## 2018-09-09 DIAGNOSIS — Z5112 Encounter for antineoplastic immunotherapy: Secondary | ICD-10-CM | POA: Insufficient documentation

## 2018-09-09 DIAGNOSIS — Z7982 Long term (current) use of aspirin: Secondary | ICD-10-CM | POA: Diagnosis not present

## 2018-09-09 DIAGNOSIS — C9000 Multiple myeloma not having achieved remission: Secondary | ICD-10-CM | POA: Insufficient documentation

## 2018-09-09 DIAGNOSIS — Z95828 Presence of other vascular implants and grafts: Secondary | ICD-10-CM

## 2018-09-09 DIAGNOSIS — N400 Enlarged prostate without lower urinary tract symptoms: Secondary | ICD-10-CM | POA: Diagnosis not present

## 2018-09-09 DIAGNOSIS — Z7189 Other specified counseling: Secondary | ICD-10-CM

## 2018-09-09 DIAGNOSIS — Z9484 Stem cells transplant status: Secondary | ICD-10-CM | POA: Insufficient documentation

## 2018-09-09 LAB — CBC WITH DIFFERENTIAL (CANCER CENTER ONLY)
Abs Immature Granulocytes: 0.01 10*3/uL (ref 0.00–0.07)
Basophils Absolute: 0 10*3/uL (ref 0.0–0.1)
Basophils Relative: 1 %
Eosinophils Absolute: 0 10*3/uL (ref 0.0–0.5)
Eosinophils Relative: 1 %
HCT: 31.7 % — ABNORMAL LOW (ref 39.0–52.0)
Hemoglobin: 10.6 g/dL — ABNORMAL LOW (ref 13.0–17.0)
Immature Granulocytes: 1 %
Lymphocytes Relative: 37 %
Lymphs Abs: 0.8 10*3/uL (ref 0.7–4.0)
MCH: 30.9 pg (ref 26.0–34.0)
MCHC: 33.4 g/dL (ref 30.0–36.0)
MCV: 92.4 fL (ref 80.0–100.0)
Monocytes Absolute: 0.3 10*3/uL (ref 0.1–1.0)
Monocytes Relative: 13 %
Neutro Abs: 1.1 10*3/uL — ABNORMAL LOW (ref 1.7–7.7)
Neutrophils Relative %: 47 %
Platelet Count: 85 10*3/uL — ABNORMAL LOW (ref 150–400)
RBC: 3.43 MIL/uL — ABNORMAL LOW (ref 4.22–5.81)
RDW: 16.3 % — ABNORMAL HIGH (ref 11.5–15.5)
WBC Count: 2.2 10*3/uL — ABNORMAL LOW (ref 4.0–10.5)
nRBC: 0 % (ref 0.0–0.2)

## 2018-09-09 LAB — CMP (CANCER CENTER ONLY)
ALT: 11 U/L (ref 0–44)
AST: 12 U/L — ABNORMAL LOW (ref 15–41)
Albumin: 3.6 g/dL (ref 3.5–5.0)
Alkaline Phosphatase: 80 U/L (ref 38–126)
Anion gap: 10 (ref 5–15)
BUN: 9 mg/dL (ref 8–23)
CO2: 23 mmol/L (ref 22–32)
Calcium: 9.6 mg/dL (ref 8.9–10.3)
Chloride: 106 mmol/L (ref 98–111)
Creatinine: 0.71 mg/dL (ref 0.61–1.24)
GFR, Est AFR Am: >60 mL/min (ref >60)
GFR, Estimated: >60 mL/min (ref >60)
Glucose, Bld: 147 mg/dL — ABNORMAL HIGH (ref 70–99)
Potassium: 3.8 mmol/L (ref 3.5–5.1)
Sodium: 139 mmol/L (ref 135–145)
Total Bilirubin: 0.5 mg/dL (ref 0.3–1.2)
Total Protein: 6.4 g/dL — ABNORMAL LOW (ref 6.5–8.1)

## 2018-09-09 MED ORDER — ACETAMINOPHEN 325 MG PO TABS
650.0000 mg | ORAL_TABLET | Freq: Once | ORAL | Status: AC
  Administered 2018-09-09: 650 mg via ORAL

## 2018-09-09 MED ORDER — HEPARIN SOD (PORK) LOCK FLUSH 100 UNIT/ML IV SOLN
500.0000 [IU] | Freq: Once | INTRAVENOUS | Status: AC | PRN
  Administered 2018-09-09: 500 [IU]
  Filled 2018-09-09: qty 5

## 2018-09-09 MED ORDER — ZOLEDRONIC ACID 4 MG/100ML IV SOLN
4.0000 mg | Freq: Once | INTRAVENOUS | Status: AC
  Administered 2018-09-09: 4 mg via INTRAVENOUS
  Filled 2018-09-09: qty 100

## 2018-09-09 MED ORDER — ACETAMINOPHEN 325 MG PO TABS
ORAL_TABLET | ORAL | Status: AC
  Filled 2018-09-09: qty 2

## 2018-09-09 MED ORDER — DIPHENHYDRAMINE HCL 25 MG PO CAPS
ORAL_CAPSULE | ORAL | Status: AC
  Filled 2018-09-09: qty 2

## 2018-09-09 MED ORDER — SODIUM CHLORIDE 0.9 % IV SOLN
15.5000 mg/kg | Freq: Once | INTRAVENOUS | Status: AC
  Administered 2018-09-09: 900 mg via INTRAVENOUS
  Filled 2018-09-09: qty 40

## 2018-09-09 MED ORDER — FAMOTIDINE IN NACL 20-0.9 MG/50ML-% IV SOLN
20.0000 mg | Freq: Once | INTRAVENOUS | Status: AC
  Administered 2018-09-09: 20 mg via INTRAVENOUS

## 2018-09-09 MED ORDER — FAMOTIDINE IN NACL 20-0.9 MG/50ML-% IV SOLN
INTRAVENOUS | Status: AC
  Filled 2018-09-09: qty 50

## 2018-09-09 MED ORDER — DIPHENHYDRAMINE HCL 25 MG PO CAPS
50.0000 mg | ORAL_CAPSULE | Freq: Once | ORAL | Status: AC
  Administered 2018-09-09: 50 mg via ORAL

## 2018-09-09 MED ORDER — SODIUM CHLORIDE 0.9 % IV SOLN
Freq: Once | INTRAVENOUS | Status: AC
  Administered 2018-09-09: 10:00:00 via INTRAVENOUS
  Filled 2018-09-09: qty 250

## 2018-09-09 MED ORDER — SODIUM CHLORIDE 0.9% FLUSH
10.0000 mL | INTRAVENOUS | Status: DC | PRN
  Administered 2018-09-09: 10 mL
  Filled 2018-09-09: qty 10

## 2018-09-09 MED ORDER — SODIUM CHLORIDE 0.9 % IV SOLN
20.0000 mg | Freq: Once | INTRAVENOUS | Status: AC
  Administered 2018-09-09: 20 mg via INTRAVENOUS
  Filled 2018-09-09: qty 20

## 2018-09-09 NOTE — Patient Instructions (Signed)
[facility] Discharge Instructions for Patients Receiving Chemotherapy  Today you received the following chemotherapy agents: Darzalex.  To help prevent nausea and vomiting after your treatment, we encourage you to take your nausea medication as directed.   If you develop nausea and vomiting that is not controlled by your nausea medication, call the clinic.   BELOW ARE SYMPTOMS THAT SHOULD BE REPORTED IMMEDIATELY:  *FEVER GREATER THAN 100.5 F  *CHILLS WITH OR WITHOUT FEVER  NAUSEA AND VOMITING THAT IS NOT CONTROLLED WITH YOUR NAUSEA MEDICATION  *UNUSUAL SHORTNESS OF BREATH  *UNUSUAL BRUISING OR BLEEDING  TENDERNESS IN MOUTH AND THROAT WITH OR WITHOUT PRESENCE OF ULCERS  *URINARY PROBLEMS  *BOWEL PROBLEMS  UNUSUAL RASH Items with * indicate a potential emergency and should be followed up as soon as possible.  Feel free to call the clinic should you have any questions or concerns. The clinic phone number is [phone].  Please show the [place] at check-in to the Emergency Department and triage nurse.  Zoledronic Acid injection (Hypercalcemia, Oncology) What is this medicine? ZOLEDRONIC ACID (ZOE le dron ik AS id) lowers the amount of calcium loss from bone. It is used to treat too much calcium in your blood from cancer. It is also used to prevent complications of cancer that has spread to the bone. This medicine may be used for other purposes; ask your health care provider or pharmacist if you have questions. COMMON BRAND NAME(S): Zometa What should I tell my health care provider before I take this medicine? They need to know if you have any of these conditions:  aspirin-sensitive asthma  cancer, especially if you are receiving medicines used to treat cancer  dental disease or wear dentures  infection  kidney disease  receiving corticosteroids like dexamethasone or prednisone  an unusual or allergic reaction to zoledronic acid, other  medicines, foods, dyes, or preservatives  pregnant or trying to get pregnant  breast-feeding How should I use this medicine? This medicine is for infusion into a vein. It is given by a health care professional in a hospital or clinic setting. Talk to your pediatrician regarding the use of this medicine in children. Special care may be needed. Overdosage: If you think you have taken too much of this medicine contact a poison control center or emergency room at once. NOTE: This medicine is only for you. Do not share this medicine with others. What if I miss a dose? It is important not to miss your dose. Call your doctor or health care professional if you are unable to keep an appointment. What may interact with this medicine?  certain antibiotics given by injection  NSAIDs, medicines for pain and inflammation, like ibuprofen or naproxen  some diuretics like bumetanide, furosemide  teriparatide  thalidomide This list may not describe all possible interactions. Give your health care provider a list of all the medicines, herbs, non-prescription drugs, or dietary supplements you use. Also tell them if you smoke, drink alcohol, or use illegal drugs. Some items may interact with your medicine. What should I watch for while using this medicine? Visit your doctor or health care professional for regular checkups. It may be some time before you see the benefit from this medicine. Do not stop taking your medicine unless your doctor tells you to. Your doctor may order blood tests or other tests to see how you are doing. Women should inform their doctor if they wish to become pregnant or think they might be pregnant.  There is a potential for serious side effects to an unborn child. Talk to your health care professional or pharmacist for more information. You should make sure that you get enough calcium and vitamin D while you are taking this medicine. Discuss the foods you eat and the vitamins you take  with your health care professional. Some people who take this medicine have severe bone, joint, and/or muscle pain. This medicine may also increase your risk for jaw problems or a broken thigh bone. Tell your doctor right away if you have severe pain in your jaw, bones, joints, or muscles. Tell your doctor if you have any pain that does not go away or that gets worse. Tell your dentist and dental surgeon that you are taking this medicine. You should not have major dental surgery while on this medicine. See your dentist to have a dental exam and fix any dental problems before starting this medicine. Take good care of your teeth while on this medicine. Make sure you see your dentist for regular follow-up appointments. What side effects may I notice from receiving this medicine? Side effects that you should report to your doctor or health care professional as soon as possible:  allergic reactions like skin rash, itching or hives, swelling of the face, lips, or tongue  anxiety, confusion, or depression  breathing problems  changes in vision  eye pain  feeling faint or lightheaded, falls  jaw pain, especially after dental work  mouth sores  muscle cramps, stiffness, or weakness  redness, blistering, peeling or loosening of the skin, including inside the mouth  trouble passing urine or change in the amount of urine Side effects that usually do not require medical attention (report to your doctor or health care professional if they continue or are bothersome):  bone, joint, or muscle pain  constipation  diarrhea  fever  hair loss  irritation at site where injected  loss of appetite  nausea, vomiting  stomach upset  trouble sleeping  trouble swallowing  weak or tired This list may not describe all possible side effects. Call your doctor for medical advice about side effects. You may report side effects to FDA at 1-800-FDA-1088. Where should I keep my medicine? This drug  is given in a hospital or clinic and will not be stored at home. NOTE: This sheet is a summary. It may not cover all possible information. If you have questions about this medicine, talk to your doctor, pharmacist, or health care provider.  2020 Elsevier/Gold Standard (2013-06-17 14:19:39)

## 2018-09-09 NOTE — Patient Instructions (Signed)
Tunneled Central Venous Catheter Flushing Guide  It is important to flush your tunneled central venous catheter each time you use it, both before and after you use it. Flushing your catheter will help prevent it from clogging. What are the risks? Risks may include:  Infection.  Air getting into the catheter and bloodstream. Supplies needed:  A clean pair of gloves.  A disinfecting wipe. Use an alcohol wipe, chlorhexidine wipe, or iodine wipe as told by your health care provider.  A 10 mL syringe that has been prefilled with saline solution.  An empty 10 mL syringe, if a substance called heparin was injected into your catheter. How to flush your catheter When you flush your catheter, make sure you follow any specific instructions from your health care provider or the manufacturer. These are general guidelines. Flushing your catheter before use If there is heparin in your catheter: 1. Wash your hands with soap and water. 2. Put on gloves. 3. Scrub the injection cap for a minimum of 15 seconds with a disinfecting wipe. 4. Unclamp the catheter. 5. Attach the empty syringe to the injection cap. 6. Pull the syringe plunger back and withdraw 10 mL of blood. 7. Place the syringe into an appropriate waste container. 8. Scrub the injection cap for 15 seconds with a disinfecting wipe. 9. Attach the prefilled syringe to the injection cap. 10. Flush the catheter by pushing the plunger forward until all the liquid from the syringe is in the catheter. 11. Remove the syringe from the injection cap. 12. Clamp the catheter. If there is no heparin in your catheter: 1. Wash your hands with soap and water. 2. Put on gloves. 3. Scrub the injection cap for 15 seconds with a disinfecting wipe. 4. Unclamp the catheter. 5. Attach the prefilled syringe to the injection cap. 6. Flush the catheter by pushing the plunger forward until 5 mL of the liquid from the syringe is in the catheter. 7. Pull back on  the syringe until you see blood in the catheter. 8. If you have been asked to collect any blood, follow your health care provider's instructions. Otherwise, flush the catheter with the rest of the solution from the syringe. 9. Remove the syringe from the injection cap. 10. Clamp the catheter.  Flushing your catheter after use 1. Wash your hands with soap and water. 2. Put on gloves. 3. Scrub the injection cap for 15 seconds with a disinfecting wipe. 4. Unclamp the catheter. 5. Attach the prefilled syringe to the injection cap. 6. Flush the catheter by pushing the plunger forward until all of the liquid from the syringe is in the catheter. 7. Remove the syringe from the injection cap. 8. Clamp the catheter. Problems and solutions  If blood cannot be completely cleared from the injection cap, you may need to have the injection cap replaced.  If the catheter is difficult to flush, use the pulsing method. The pulsing method involves pushing only a few milliliters of solution into the catheter at a time and pausing between pushes.  If you do not see blood in the catheter when you pull back on the syringe, change your body position, such as by raising your arms above your head. Take a deep breath and cough. Then, pull back on the syringe. If you still do not see blood, flush the catheter with a small amount of solution. Then, change positions again and take a breath or cough. Pull back on the syringe again. If you still do not see   blood, finish flushing the catheter and contact your health care provider. Do not use your catheter until your health care provider says it is okay. General tips  Have someone help you flush your catheter, if possible.  Do not force fluid through your catheter.  Do not use a syringe that is larger or smaller than 10 mL. Using a smaller syringe can make the catheter burst.  Do not use your catheter without flushing it first if it has heparin in it. Contact a health  care provider if:  You cannot see any blood in the catheter when you flush it before using it.  Your catheter is difficult to flush. Get help right away if:  You cannot flush the catheter.  The catheter leaks when you flush it or when there is fluid in it.  There are cracks or breaks in the catheter. Summary  It is important to flush your tunneled central venous catheter each time you use it, both before and after you use it.  Scrub the injection cap for 15 seconds with a disinfecting wipe before and after you flush it.  When you flush your catheter, make sure you follow any specific instructions from your health care provider or the manufacturer.  Get help right away if you cannot flush the catheter. This information is not intended to replace advice given to you by your health care provider. Make sure you discuss any questions you have with your health care provider. Document Released: 01/08/2011 Document Revised: 04/06/2018 Document Reviewed: 04/06/2018 Elsevier Patient Education  2020 Elsevier Inc.  

## 2018-09-09 NOTE — Progress Notes (Signed)
Per Dr. Alen Blew, ok to treat with ANC 1.1 and platelets 85. Advised to have patient continue to hold Pomalyst. Patient aware and verbalizes understanding.

## 2018-09-12 ENCOUNTER — Other Ambulatory Visit: Payer: Self-pay | Admitting: Hematology

## 2018-09-12 DIAGNOSIS — Z7189 Other specified counseling: Secondary | ICD-10-CM

## 2018-09-12 DIAGNOSIS — C9000 Multiple myeloma not having achieved remission: Secondary | ICD-10-CM

## 2018-09-13 ENCOUNTER — Telehealth: Payer: Self-pay | Admitting: *Deleted

## 2018-09-13 ENCOUNTER — Other Ambulatory Visit: Payer: Self-pay | Admitting: *Deleted

## 2018-09-13 DIAGNOSIS — C9000 Multiple myeloma not having achieved remission: Secondary | ICD-10-CM

## 2018-09-13 DIAGNOSIS — Z7189 Other specified counseling: Secondary | ICD-10-CM

## 2018-09-13 MED ORDER — POMALIDOMIDE 2 MG PO CAPS: ORAL_CAPSULE | ORAL | 0 refills | Status: DC

## 2018-09-13 NOTE — Telephone Encounter (Signed)
Refill Pomalyst per Dr. Irene Limbo verbal order to restart med (has been on hold since 08/26/2018). Refill sent to [facility]) [place], [place] # [phone], 09/13/2018

## 2018-09-13 NOTE — Telephone Encounter (Signed)
Contacted patient to let him know that per Dr. Irene Limbo can restart Pomalyst. Pomalyst refill sent to [facility]. Patient verbalized understanding

## 2018-09-14 ENCOUNTER — Telehealth: Payer: Self-pay | Admitting: *Deleted

## 2018-09-14 DIAGNOSIS — Z7189 Other specified counseling: Secondary | ICD-10-CM

## 2018-09-14 DIAGNOSIS — C9000 Multiple myeloma not having achieved remission: Secondary | ICD-10-CM

## 2018-09-14 MED ORDER — POMALIDOMIDE 2 MG PO CAPS: ORAL_CAPSULE | ORAL | 0 refills | Status: DC

## 2018-09-14 NOTE — Telephone Encounter (Signed)
Sent printed prescription electronically

## 2018-09-14 NOTE — Telephone Encounter (Signed)
[employer] Josem Kaufmann [id number], 09/14/2018  Contacted by [facility] [phone]. New [employer] auth number needed. New auth number obtained.  Resent prescription for Pomalyst with above Josem Kaufmann #[id number] Contacted [facility], gave new number to rep Kearney Hard

## 2018-09-21 ENCOUNTER — Other Ambulatory Visit: Payer: Self-pay | Admitting: Hematology

## 2018-09-21 NOTE — Telephone Encounter (Signed)
Requested med

## 2018-09-22 NOTE — Progress Notes (Signed)
  Radiation Oncology         (336) [phone] ________________________________  Name: Jesus [name] MRN: [id number]  Date: 07/07/2018  DOB: [birth year]  SIMULATION AND TREATMENT PLANNING NOTE  DIAGNOSIS:     ICD-10-CM   1. Multiple myeloma not having achieved remission ([place])  C90.00      Site:   1.  T12 2.  Left hip 3.  Right femur 4.  Right scapula  NARRATIVE:  The patient was brought to the [facility].  Identity was confirmed.  All relevant records and images related to the planned course of therapy were reviewed.   Written consent to proceed with treatment was confirmed which was freely given after reviewing the details related to the planned course of therapy had been reviewed with the patient.  Then, the patient was set-up in a stable reproducible  supine position for radiation therapy.  CT images were obtained.  Surface markings were placed.    Medically necessary complex treatment device(s) for immobilization:  Vac-lock bags x 2.   The CT images were loaded into the planning software.  Then the target and avoidance structures were contoured.  Treatment planning then occurred.  The radiation prescription was entered and confirmed.  A total of 9 complex treatment devices were fabricated which relate to the designed radiation treatment fields, for each of the 4 separate target areas above. Each of these customized fields/ complex treatment devices will be used on a daily basis during the radiation course. I have requested : Isodose Plan.   PLAN:  The patient will receive 25 Gy in 5 fractions to the T12 target, and 30 Gy in 10 fractions to the left hip, right femur, and right scapula.  ________________________________   Jodelle Gross, MD, PhD

## 2018-09-23 ENCOUNTER — Inpatient Hospital Stay: Payer: 59

## 2018-09-23 ENCOUNTER — Inpatient Hospital Stay (HOSPITAL_BASED_OUTPATIENT_CLINIC_OR_DEPARTMENT_OTHER): Payer: 59 | Admitting: Hematology

## 2018-09-23 ENCOUNTER — Encounter: Payer: Self-pay | Admitting: *Deleted

## 2018-09-23 ENCOUNTER — Telehealth: Payer: Self-pay | Admitting: Hematology

## 2018-09-23 ENCOUNTER — Other Ambulatory Visit: Payer: Self-pay

## 2018-09-23 VITALS — BP 128/71 | HR 92 | Temp 99.1°F | Resp 18 | Ht 67.0 in | Wt 122.0 lb

## 2018-09-23 VITALS — BP 119/63 | HR 85 | Temp 97.9°F | Resp 18

## 2018-09-23 DIAGNOSIS — C9000 Multiple myeloma not having achieved remission: Secondary | ICD-10-CM

## 2018-09-23 DIAGNOSIS — Z5112 Encounter for antineoplastic immunotherapy: Secondary | ICD-10-CM | POA: Diagnosis not present

## 2018-09-23 DIAGNOSIS — Z7189 Other specified counseling: Secondary | ICD-10-CM

## 2018-09-23 DIAGNOSIS — Z95828 Presence of other vascular implants and grafts: Secondary | ICD-10-CM

## 2018-09-23 LAB — CBC WITH DIFFERENTIAL (CANCER CENTER ONLY)
Abs Immature Granulocytes: 0.02 10*3/uL (ref 0.00–0.07)
Basophils Absolute: 0 10*3/uL (ref 0.0–0.1)
Basophils Relative: 0 %
Eosinophils Absolute: 0.1 10*3/uL (ref 0.0–0.5)
Eosinophils Relative: 2 %
HCT: 32.2 % — ABNORMAL LOW (ref 39.0–52.0)
Hemoglobin: 10.7 g/dL — ABNORMAL LOW (ref 13.0–17.0)
Immature Granulocytes: 1 %
Lymphocytes Relative: 22 %
Lymphs Abs: 0.8 10*3/uL (ref 0.7–4.0)
MCH: 30.6 pg (ref 26.0–34.0)
MCHC: 33.2 g/dL (ref 30.0–36.0)
MCV: 92 fL (ref 80.0–100.0)
Monocytes Absolute: 0.3 10*3/uL (ref 0.1–1.0)
Monocytes Relative: 7 %
Neutro Abs: 2.6 10*3/uL (ref 1.7–7.7)
Neutrophils Relative %: 68 %
Platelet Count: 83 10*3/uL — ABNORMAL LOW (ref 150–400)
RBC: 3.5 MIL/uL — ABNORMAL LOW (ref 4.22–5.81)
RDW: 15.9 % — ABNORMAL HIGH (ref 11.5–15.5)
WBC Count: 3.8 10*3/uL — ABNORMAL LOW (ref 4.0–10.5)
nRBC: 0 % (ref 0.0–0.2)

## 2018-09-23 LAB — CMP (CANCER CENTER ONLY)
ALT: 16 U/L (ref 0–44)
AST: 12 U/L — ABNORMAL LOW (ref 15–41)
Albumin: 3.7 g/dL (ref 3.5–5.0)
Alkaline Phosphatase: 86 U/L (ref 38–126)
Anion gap: 9 (ref 5–15)
BUN: 12 mg/dL (ref 8–23)
CO2: 23 mmol/L (ref 22–32)
Calcium: 8.9 mg/dL (ref 8.9–10.3)
Chloride: 105 mmol/L (ref 98–111)
Creatinine: 0.73 mg/dL (ref 0.61–1.24)
GFR, Est AFR Am: >60 mL/min (ref >60)
GFR, Estimated: >60 mL/min (ref >60)
Glucose, Bld: 117 mg/dL — ABNORMAL HIGH (ref 70–99)
Potassium: 3.8 mmol/L (ref 3.5–5.1)
Sodium: 137 mmol/L (ref 135–145)
Total Bilirubin: 0.6 mg/dL (ref 0.3–1.2)
Total Protein: 6.4 g/dL — ABNORMAL LOW (ref 6.5–8.1)

## 2018-09-23 MED ORDER — FAMOTIDINE IN NACL 20-0.9 MG/50ML-% IV SOLN
INTRAVENOUS | Status: AC
  Filled 2018-09-23: qty 50

## 2018-09-23 MED ORDER — DIPHENHYDRAMINE HCL 25 MG PO CAPS
ORAL_CAPSULE | ORAL | Status: AC
  Filled 2018-09-23: qty 2

## 2018-09-23 MED ORDER — SODIUM CHLORIDE 0.9% FLUSH
10.0000 mL | Freq: Once | INTRAVENOUS | Status: AC
  Administered 2018-09-23: 10 mL
  Filled 2018-09-23: qty 10

## 2018-09-23 MED ORDER — FAMOTIDINE IN NACL 20-0.9 MG/50ML-% IV SOLN
20.0000 mg | Freq: Once | INTRAVENOUS | Status: AC
  Administered 2018-09-23: 13:00:00 20 mg via INTRAVENOUS

## 2018-09-23 MED ORDER — ACETAMINOPHEN 325 MG PO TABS
650.0000 mg | ORAL_TABLET | Freq: Once | ORAL | Status: AC
  Administered 2018-09-23: 13:00:00 650 mg via ORAL

## 2018-09-23 MED ORDER — SODIUM CHLORIDE 0.9 % IV SOLN
20.0000 mg | Freq: Once | INTRAVENOUS | Status: AC
  Administered 2018-09-23: 13:00:00 20 mg via INTRAVENOUS
  Filled 2018-09-23: qty 20

## 2018-09-23 MED ORDER — SODIUM CHLORIDE 0.9 % IV SOLN
900.0000 mg | Freq: Once | INTRAVENOUS | Status: AC
  Administered 2018-09-23: 14:00:00 900 mg via INTRAVENOUS
  Filled 2018-09-23: qty 5

## 2018-09-23 MED ORDER — SODIUM CHLORIDE 0.9 % IV SOLN
Freq: Once | INTRAVENOUS | Status: AC
  Administered 2018-09-23: 12:00:00 via INTRAVENOUS
  Filled 2018-09-23: qty 250

## 2018-09-23 MED ORDER — ACETAMINOPHEN 325 MG PO TABS
ORAL_TABLET | ORAL | Status: AC
  Filled 2018-09-23: qty 2

## 2018-09-23 MED ORDER — SODIUM CHLORIDE 0.9% FLUSH
10.0000 mL | INTRAVENOUS | Status: DC | PRN
  Administered 2018-09-23: 10 mL
  Filled 2018-09-23: qty 10

## 2018-09-23 MED ORDER — DIPHENHYDRAMINE HCL 25 MG PO CAPS
50.0000 mg | ORAL_CAPSULE | Freq: Once | ORAL | Status: AC
  Administered 2018-09-23: 13:00:00 50 mg via ORAL

## 2018-09-23 MED ORDER — HEPARIN SOD (PORK) LOCK FLUSH 100 UNIT/ML IV SOLN
500.0000 [IU] | Freq: Once | INTRAVENOUS | Status: AC | PRN
  Administered 2018-09-23: 16:00:00 500 [IU]
  Filled 2018-09-23: qty 5

## 2018-09-23 NOTE — Telephone Encounter (Signed)
Scheduled per 08/21 los, patient received avs report and calender. Cancelled 10/05 appointment to fit with treatment plan.

## 2018-09-23 NOTE — Progress Notes (Signed)
HEMATOLOGY/ONCOLOGY CLINIC NOTE  Date of Service: 09/23/2018  Patient Care Team: Patient, No Pcp Per as PCP - General (General Practice)  CHIEF COMPLAINTS/PURPOSE OF CONSULTATION:   Continued management of Multiple Myeloma  Oncologic History:   Jesus [name] was diagnosed with multiple myeloma in July 2018 in [place]. He initially developed mid back pain in May 2018 and was seen to have a T10 vertebral body mass with additional lytic lesion at T9. His initial M spike in July 2018 was 1.5g with IgG Lambda specificity and IgG elevated at 2254m. His July 2018 BM Bx revealed 20% monoclonal plasma cells and FISH revealed a 17p deletion. He received 4 fractions of palliative RT, however his back pain worsened and developed bilateral lower extremity numbness and tingling, and was then seen to have a T10 pathologic fracture with cord compression s/p T10 corpectomy. The pt began VRd on 09/24/16, completed 7 cycles, then [facility], post transplant complicated by neutropenic colitis and deconditioning. Repeat BM Bx on 04/21/17 revealed residual plasma cell with less than 5% lambda light chain restricted plasma cells, and a post-transplant M spike of 0.4g. The pt then began maintenance 527mm2 Carfilzomib every 2 weeks on 06/16/17.  HISTORY OF PRESENTING ILLNESS:   Jesus MACLEODs a wonderful 89.o. male who has been referred to [place] Dr. BePhil Doppor evaluation and management of Multiple Myeloma. The pt reports that he is doing well overall.  The [place], the pt has moved back to his home which is here locally, and is transferring his care here now as well. The pt notes that he is anticipating C11D1 maintenance Carfilzomib today. The pt notes that his most recent M  spike was 0.2g. His last BM biopsy was March 2019, as noted above. He denies any infection issues in the last year. The pt notes that he has received all of is post-transplant vaccinations. [facility]  The pt reports that he continues to have back pain. He has been receiving Zometa every 4 weeks, but was recently transitioned to every 3 months, he denies dental concerns at this time. He is taking Calcium and Vitamin D replacement.   The pt has been taking Marinol for appetite stimulation.   The pt notes that he has an enlarged prostate, which was seen on imaging. He notes that he was expecting to receive a PSA test soon.  The pt notes that prior to his Multiple Myeloma diagnosis in July 2018, he had a "clean bill of health." He denies DM, HTN, lung problems, heart problems, thyroid problems or other medical concerns.  Most recent lab results (03/23/18) of CBC is as follows: all values are WNL except for RBC at 4.03, HGB at 11.5, HCT at 35.6%, MPV at 8.9, Glucose at 135, Total Protein at 6.2.  On review of systems, pt reports chronic back pain, stable energy levels, and denies concerns for infections, new pain along the spine, abdominal pains, leg swelling, and any other symptoms.   On PMHx the pt reports Multiple Myeloma, denies HTN, HLD, lung problems, strokes, seizures, heart problems and thyroid problems. On Social Hx the pt reports working for the [employer] development [employer] Interval History:  Jesus [name] returns today for management and evaluation of his multiple myeloma not in remission. The patient's last visit with [place] was on 08/26/2018. The pt reports that he is doing well overall.  The pt reports that his neck is hurting, it appears to be a pulled tendon. He does not apply heat or massage to the area but does apply ice and exercise the area. Pt is going to have a hip surgery due to problems with his left hip. He may also have a  fatty mass removed during the surgery. Dr. Norma Fredrickson did and MRI on pts back on 08/09/2018.   Of note since the patient's last visit, pt has had a bone marrow biopsy completed on 08/22/2018 with results revealing "Plasma cell myeloma, focal minimal residual. Normocellular trilineage hematopoieses with maturation. Pancytopenia."  Lab results today (09/23/18) of CBC w/diff and CMP is as follows: all values are WNL except for WBC at 3.8K, RBC at 3.50, Hgb at 10.7, Hct at 32.2, RDW at 15.9, Platelet Count at 83K, Glucose at 117, Total Protein at 6.4, AST at 12.   On review of systems, pt reports left hip pain, neck pain and denies abdominal pain and any other symptoms.   The pt has no prohibitive toxicities from continuing daratumumab at this time.   MEDICAL HISTORY:  Past Medical History:  Diagnosis Date  . Cancer [facility])     SURGICAL HISTORY: Past Surgical History:  Procedure Laterality Date  . BACK SURGERY    . IR IMAGING GUIDED PORT INSERTION  06/21/2018    SOCIAL HISTORY: Social History   Socioeconomic History  . Marital status: Married    Spouse name: Not on file  . Number of children: Not on file  . Years of education: Not on file  . Highest education level: Not on file  Occupational History  . Not on file  Social Needs  . Financial resource strain: Not on file  . Food insecurity    Worry: Not on file    Inability: Not on file  . Transportation needs    Medical: No    Non-medical: No  Tobacco Use  . Smoking status: Never Smoker  . Smokeless tobacco: Never Used  Substance and Sexual Activity  . Alcohol use: Never    Frequency: Never  . Drug use: Never  . Sexual activity: Not on file  Lifestyle  . Physical activity    Days per week: Not on file    Minutes per session: Not on file  . Stress: Not on file  Relationships  . Social Herbalist on phone: Not on file    Gets together: Not on file    Attends religious service: Not on file    Active member  of club or organization: Not on file    Attends meetings of clubs or organizations: Not on file    Relationship status: Not on file  . Intimate partner violence    Fear of current or ex partner: Not on file    Emotionally abused: Not on file    Physically abused: Not on file    Forced sexual activity: Not on file  Other Topics Concern  . Not on file  Social History Narrative  . Not on file    FAMILY HISTORY: Family History  Problem Relation Age of Onset  . Breast cancer Sister     ALLERGIES:  is allergic to penicillins.  MEDICATIONS:  Current Outpatient Medications  Medication Sig Dispense Refill  .  acetaminophen (TYLENOL) 325 MG tablet Take by mouth.    [name] acyclovir (ZOVIRAX) 400 MG tablet Take 1 tablet (400 mg total) by mouth 2 (two) times daily. 60 tablet 2  . acyclovir (ZOVIRAX) 400 MG tablet Take 1 tablet (400 mg total) by mouth 2 (two) times daily. 60 tablet 11  . aspirin 81 MG EC tablet Take by mouth.    [name] atovaquone-proguanil (MALARONE) 250-100 MG TABS tablet TAKE 1 TABLET BY MOUTH ONCE DAILY FOR 25 DAYS BEGIN 1 2 DAYS BEFORE AND CONTINUE UNTIL 1 WEEK AFTER EXPOSURE FOR PREVENTION OF MALARIA    . buPROPion (WELLBUTRIN SR) 150 MG 12 hr tablet 1 pill a day for 3 days.  Then BID    . Calcium Carb-Cholecalciferol (CALCIUM 1000 + D) 1000-800 MG-UNIT TABS Take 1,000 mg by mouth once.    . Cholecalciferol (VITAMIN D3) 25 MCG (1000 UT) CAPS Take by mouth.    . ciprofloxacin (CIPRO) 500 MG tablet TAKE 1 TABLET BY MOUTH TWICE DAILY FOR 5 DAYS MAY TAKE FOR 3 5 DAYS FOR TRAVELERS DIARRHEA MAY DISCONTINUE WHEN SYMPTOMS RESOLVED    . dexamethasone (DECADRON) 4 MG tablet Take 5 tablets (68m) with breakfast the day after each dose of Daratumumab 120 tablet 1  . doxycycline (VIBRA-TABS) 100 MG tablet Take by mouth.    . dronabinol (MARINOL) 10 MG capsule TAKE 1 CAPSULE BY MOUTH TWICE DAILY BEFORE MEAL(S) 60 capsule 0  . gabapentin (NEURONTIN) 300 MG capsule Take 1 capsule (300 mg total)  by mouth 3 (three) times daily. 90 capsule 2  . lidocaine-prilocaine (EMLA) cream Apply to affected area once 30 g 3  . LORazepam (ATIVAN) 0.5 MG tablet Take by mouth.    . morphine (MS CONTIN) 15 MG 12 hr tablet Take 1 tablet (15 mg total) by mouth every 12 (twelve) hours. 60 tablet 0  . Multiple Vitamins-Minerals (ONE DAILY CALCIUM/IRON) TABS Take by mouth.    . ondansetron (ZOFRAN) 4 MG tablet Take 1 tablet (4 mg total) by mouth every 8 (eight) hours as needed for nausea or vomiting. 30 tablet 0  . ondansetron (ZOFRAN) 8 MG tablet Take 1 tablet (8 mg total) by mouth 2 (two) times daily as needed (Nausea or vomiting). 30 tablet 1  . ondansetron (ZOFRAN-ODT) 4 MG disintegrating tablet Place under the tongue.    .Marland KitchenoxyCODONE (OXY IR/ROXICODONE) 5 MG immediate release tablet Take 1 tablet (5 mg total) by mouth every 4 (four) hours as needed for severe pain. 120 tablet 0  . pomalidomide (POMALYST) 2 MG capsule TAKE 1 CAPSULE BY MOUTH  DAILY FOR 21 DAYS, THEN 7  DAYS OFF. TAKE WITH WATER 21 capsule 0  . prochlorperazine (COMPAZINE) 10 MG tablet Take 1 tablet (10 mg total) by mouth every 6 (six) hours as needed (Nausea or vomiting). 30 tablet 1  . senna-docusate (SENOKOT-S) 8.6-50 MG tablet Take by mouth.    . sucralfate (CARAFATE) 1 g tablet Take 1 tablet (1 g total) by mouth 4 (four) times daily. 120 tablet 0  . traMADol (ULTRAM) 50 MG tablet TAKE 1 TABLET BY MOUTH EVERY 6 HOURS AS NEEDED FOR MODERATE PAIN OR SEVERE PAIN     No current facility-administered medications for this visit.    Facility-Administered Medications Ordered in Other Visits  Medication Dose Route Frequency Provider Last Rate Last Dose  . heparin lock flush 100 unit/mL  500 Units Intracatheter Once PRN KBrunetta Genera MD      . sodium chloride flush (NS) 0.9 %  injection 10 mL  10 mL Intracatheter PRN Brunetta Genera, MD        REVIEW OF SYSTEMS:   A 10+ POINT REVIEW OF SYSTEMS WAS OBTAINED including neurology,  dermatology, psychiatry, cardiac, respiratory, lymph, extremities, GI, GU, Musculoskeletal, constitutional, breasts, reproductive, HEENT.  All pertinent positives are noted in the HPI.  All others are negative.   PHYSICAL EXAMINATION: ECOG PERFORMANCE STATUS: 1 - Symptomatic but completely ambulatory  Vitals:   09/23/18 0950  BP: 128/71  Pulse: 92  Resp: 18  Temp: 99.1 F (37.3 C)  SpO2: 97%   Filed Weights   09/23/18 0950  Weight: 122 lb (55.3 kg)   .Body mass index is 19.11 kg/m.   GENERAL:alert, in no acute distress and comfortable SKIN: no acute rashes, no significant lesions EYES: conjunctiva are pink and non-injected, sclera anicteric OROPHARYNX: MMM, no exudates, no oropharyngeal erythema or ulceration NECK: supple, no JVD LYMPH:  no palpable lymphadenopathy in the cervical, axillary or inguinal regions LUNGS: clear to auscultation b/l with normal respiratory effort HEART: regular rate & rhythm ABDOMEN:  normoactive bowel sounds , non tender, not distended. Extremity: no pedal edema PSYCH: alert & oriented x 3 with fluent speech NEURO: no focal motor/sensory deficits   LABORATORY DATA:  I have reviewed the data as listed  . CBC Latest Ref Rng & Units 09/23/2018 09/09/2018 08/26/2018  WBC 4.0 - 10.5 K/uL 3.8(L) 2.2(L) 2.5(L)  Hemoglobin 13.0 - 17.0 g/dL 10.7(L) 10.6(L) 10.7(L)  Hematocrit 39.0 - 52.0 % 32.2(L) 31.7(L) 32.0(L)  Platelets 150 - 400 K/uL 83(L) 85(L) 51(L)    . CMP Latest Ref Rng & Units 09/23/2018 09/09/2018 08/26/2018  Glucose 70 - 99 mg/dL 117(H) 147(H) 129(H)  BUN 8 - 23 mg/dL _0 Creatinine 0.61 - 1.24 mg/dL 0.73 0.71 0.64  Sodium 135 - 145 mmol/L 137 139 140  Potassium 3.5 - 5.1 mmol/L 3.8 3.8 3.4(L)  Chloride 98 - 111 mmol/L 105 106 108  CO2 22 - 32 mmol/L _1 Calcium 8.9 - 10.3 mg/dL 8.9 9.6 8.7(L)  Total Protein 6.5 - 8.1 g/dL 6.4(L) 6.4(L) 6.3(L)  Total Bilirubin 0.3 - 1.2 mg/dL 0.6 0.5 0.3  Alkaline Phos 38 - 126 U/L 86 80 79   AST 15 - 41 U/L 12(L) 12(L) 11(L)  ALT 0 - 44 U/L _2 06/30/18 BM Biopsy:    08/22/2018 Bone Marrow Biopsy     RADIOGRAPHIC STUDIES: I have personally reviewed the radiological images as listed and agreed with the findings in the report. No results found.  ASSESSMENT & PLAN:  63 y.o. male with  1. 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 IgG at 2220. 08/11/16 PET/CT revealed Hypermetabolic large soft tissue mass in the lower thoracic paraspinal region associated with lytic destruction of T10 vertebral body, concerning for multiple myeloma. Additional smaller lytic lesions involving the skeleton. S/p RT x 4 fractions, discontinued due to T10 pathologic fracture with severe cord compression S/p 08/26/16 T10 corpectomy and T8-L1 posterior spinal fusion   Began 7 cycles of VRd on 09/24/16 S/p autologous stem cell transplant, Day 0 on 02/23/17 04/21/17 BM Bx with residual less than 5% lambda light chain restricted plasma cells in the bone marrow. M Spike at 0.4g Began maintenance 4m/m2 Carfilzomib every 2  weeks on 06/16/17  06/09/18 PET/CT revealed "Multifocal hypermetabolic lytic lesions in the skeleton as noted above, compatible with active myeloma. In the spine, the most notable active lesion of concern is at the T12 level with there is a large right eccentric vertebral body lesion with demineralization of the cortex and likely some paravertebral extension of tumor. This could cause loosening of the right pedicle screw. Intraspinal extension of tumor is difficult to exclude. MRI might be considered although the posterolateral rod and pedicle screws may cause artifact. 2. There is likewise cortical breakthrough associated with the lytic lesion along the left upper acetabulum. 3. A lesion in the left  intertrochanteric area of the femur is large enough to potentially cause biomechanical weakening which increases risk of fracture. 4. Other skeletal sites of active involvement are detailed above in the skeleton section. 5. Several lucent lesions are observed including the calvarium, T1 vertebral body, and left L4 pedicle which are not hypermetabolic and may represent effectively previously treated lesions. 6. 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 There is also a lipoma along the right brachialis muscle. 7. Nonspecific subcutaneous edema especially in the right forearm but also extending up into the right upper arm. Cause is uncertain. There is some low-grade nonfocal metabolic activity along this area. Strictly speaking, cellulitis is not excluded, correlate with clinical history and visual inspection. 8. Other imaging findings of potential clinical significance: Aortic Atherosclerosis. Mild cardiomegaly. Cholelithiasis. Prostatomegaly."  06/17/18 MMP revealed M Protein at 0.5g, just before beginning C1 Daratumumab  06/30/18 BM Biopsy revealed normocellular marrow with minimal involvement by plasma cell neoplasm (5% plasma cells), normal cytogenetics. 06/30/18 Molecular Cytogenetics did not have enough material for testing.    PLAN:  -Discussed pt labwork today, 09/23/18; blood counts and chemistries are steady and leukocytes have increased. -Discussed 0720/2020 Bone Marrow Biopsy which revealed "Plasma cell myeloma, focal minimal residual. Normocellular trilineage hematopoieses with maturation. Pancytopenia."  -Rx high dose Vitamin D 2x a week -Repeat PET in next 3 weeks    FOLLOW UP: Please schedule next 2 cycles of treatment as per orders Labs with each treatment with port flush MD visit in 4 weeks Continue Zometa q4weeks PET/CT in 3 weeks  All of the patients questions were answered with apparent satisfaction. The  patient knows to call the clinic with any problems, questions or concerns.  The total time spent in the appt was 20 minutes and more than 50% was on counseling and direct patient cares.  All of the patient's questions were answered with apparent satisfaction. The patient knows to call the clinic with any problems, questions or concerns.     Sullivan Lone MD [place] AAHIVMS [facility] Hematology/Oncology Physician [facility]  (Office):       [phone] (Work cell):  [phone] (Fax):           [phone]  09/23/2018 3:48 PM  I, Yevette Edwards, am acting as a scribe for Dr. Sullivan Lone.   .I have reviewed the above documentation for accuracy and completeness, and I agree with the above. Brunetta Genera MD

## 2018-09-23 NOTE — Progress Notes (Signed)
Okay to treat with plt 72 per Dr. Irene Limbo [id number].

## 2018-09-23 NOTE — Patient Instructions (Signed)
[facility] Discharge Instructions for Patients Receiving Chemotherapy  Today you received the following chemotherapy agents: Darzalex  To help prevent nausea and vomiting after your treatment, we encourage you to take your nausea medication as directed.    If you develop nausea and vomiting that is not controlled by your nausea medication, call the clinic.   BELOW ARE SYMPTOMS THAT SHOULD BE REPORTED IMMEDIATELY:  *FEVER GREATER THAN 100.5 F  *CHILLS WITH OR WITHOUT FEVER  NAUSEA AND VOMITING THAT IS NOT CONTROLLED WITH YOUR NAUSEA MEDICATION  *UNUSUAL SHORTNESS OF BREATH  *UNUSUAL BRUISING OR BLEEDING  TENDERNESS IN MOUTH AND THROAT WITH OR WITHOUT PRESENCE OF ULCERS  *URINARY PROBLEMS  *BOWEL PROBLEMS  UNUSUAL RASH Items with * indicate a potential emergency and should be followed up as soon as possible.  Feel free to call the clinic should you have any questions or concerns. The clinic phone number is [phone].  Please show the CHEMO ALERT CARD at check-in to the Emergency Department and triage nurse.   

## 2018-09-29 MED ORDER — ERGOCALCIFEROL 1.25 MG (50000 UT) PO CAPS: 50000.0000 [IU] | ORAL_CAPSULE | ORAL | 6 refills | Status: DC

## 2018-09-30 MED ORDER — LIDOCAINE HCL 2 % IJ SOLN
INTRAMUSCULAR | Status: AC
  Filled 2018-09-30: qty 20

## 2018-10-05 ENCOUNTER — Other Ambulatory Visit: Payer: Self-pay | Admitting: Hematology

## 2018-10-05 DIAGNOSIS — C9000 Multiple myeloma not having achieved remission: Secondary | ICD-10-CM

## 2018-10-05 DIAGNOSIS — Z7189 Other specified counseling: Secondary | ICD-10-CM

## 2018-10-05 NOTE — Telephone Encounter (Signed)
Refilled Pomalyst per Dr. Irene Limbo direction (noted) 09/13/2018 Refill sent elecronically to [facility] [place], [place] # [id number], 10/05/2018

## 2018-10-07 ENCOUNTER — Other Ambulatory Visit: Payer: Self-pay

## 2018-10-07 ENCOUNTER — Inpatient Hospital Stay: Payer: 59 | Attending: Hematology

## 2018-10-07 ENCOUNTER — Inpatient Hospital Stay: Payer: 59

## 2018-10-07 VITALS — BP 155/71 | HR 81 | Temp 97.9°F | Resp 18 | Wt 123.5 lb

## 2018-10-07 DIAGNOSIS — C9000 Multiple myeloma not having achieved remission: Secondary | ICD-10-CM | POA: Insufficient documentation

## 2018-10-07 DIAGNOSIS — Z79899 Other long term (current) drug therapy: Secondary | ICD-10-CM | POA: Diagnosis not present

## 2018-10-07 DIAGNOSIS — Z95828 Presence of other vascular implants and grafts: Secondary | ICD-10-CM

## 2018-10-07 DIAGNOSIS — Z23 Encounter for immunization: Secondary | ICD-10-CM | POA: Diagnosis not present

## 2018-10-07 DIAGNOSIS — Z7189 Other specified counseling: Secondary | ICD-10-CM

## 2018-10-07 DIAGNOSIS — Z5112 Encounter for antineoplastic immunotherapy: Secondary | ICD-10-CM | POA: Diagnosis present

## 2018-10-07 LAB — CBC WITH DIFFERENTIAL/PLATELET
Abs Immature Granulocytes: 0 10*3/uL (ref 0.00–0.07)
Basophils Absolute: 0 10*3/uL (ref 0.0–0.1)
Basophils Relative: 1 %
Eosinophils Absolute: 0 10*3/uL (ref 0.0–0.5)
Eosinophils Relative: 2 %
HCT: 32.3 % — ABNORMAL LOW (ref 39.0–52.0)
Hemoglobin: 10.6 g/dL — ABNORMAL LOW (ref 13.0–17.0)
Immature Granulocytes: 0 %
Lymphocytes Relative: 39 %
Lymphs Abs: 0.8 10*3/uL (ref 0.7–4.0)
MCH: 30.6 pg (ref 26.0–34.0)
MCHC: 32.8 g/dL (ref 30.0–36.0)
MCV: 93.4 fL (ref 80.0–100.0)
Monocytes Absolute: 0.5 10*3/uL (ref 0.1–1.0)
Monocytes Relative: 26 %
Neutro Abs: 0.6 10*3/uL — ABNORMAL LOW (ref 1.7–7.7)
Neutrophils Relative %: 32 %
Platelets: 124 10*3/uL — ABNORMAL LOW (ref 150–400)
RBC: 3.46 MIL/uL — ABNORMAL LOW (ref 4.22–5.81)
RDW: 15.9 % — ABNORMAL HIGH (ref 11.5–15.5)
WBC: 2 10*3/uL — ABNORMAL LOW (ref 4.0–10.5)
nRBC: 0 % (ref 0.0–0.2)

## 2018-10-07 LAB — CMP (CANCER CENTER ONLY)
ALT: 17 U/L (ref 0–44)
AST: 12 U/L — ABNORMAL LOW (ref 15–41)
Albumin: 3.7 g/dL (ref 3.5–5.0)
Alkaline Phosphatase: 84 U/L (ref 38–126)
Anion gap: 9 (ref 5–15)
BUN: 10 mg/dL (ref 8–23)
CO2: 21 mmol/L — ABNORMAL LOW (ref 22–32)
Calcium: 9.1 mg/dL (ref 8.9–10.3)
Chloride: 108 mmol/L (ref 98–111)
Creatinine: 0.62 mg/dL (ref 0.61–1.24)
GFR, Est AFR Am: >60 mL/min (ref >60)
GFR, Estimated: >60 mL/min (ref >60)
Glucose, Bld: 91 mg/dL (ref 70–99)
Potassium: 4.4 mmol/L (ref 3.5–5.1)
Sodium: 138 mmol/L (ref 135–145)
Total Bilirubin: 0.4 mg/dL (ref 0.3–1.2)
Total Protein: 6.3 g/dL — ABNORMAL LOW (ref 6.5–8.1)

## 2018-10-07 MED ORDER — ACETAMINOPHEN 325 MG PO TABS
ORAL_TABLET | ORAL | Status: AC
  Filled 2018-10-07: qty 2

## 2018-10-07 MED ORDER — FAMOTIDINE IN NACL 20-0.9 MG/50ML-% IV SOLN
INTRAVENOUS | Status: AC
  Filled 2018-10-07: qty 50

## 2018-10-07 MED ORDER — DIPHENHYDRAMINE HCL 25 MG PO CAPS
50.0000 mg | ORAL_CAPSULE | Freq: Once | ORAL | Status: AC
  Administered 2018-10-07: 50 mg via ORAL

## 2018-10-07 MED ORDER — HEPARIN SOD (PORK) LOCK FLUSH 100 UNIT/ML IV SOLN
500.0000 [IU] | Freq: Once | INTRAVENOUS | Status: AC | PRN
  Administered 2018-10-07: 500 [IU]
  Filled 2018-10-07: qty 5

## 2018-10-07 MED ORDER — SODIUM CHLORIDE 0.9 % IV SOLN
Freq: Once | INTRAVENOUS | Status: AC
  Administered 2018-10-07: 10:00:00 via INTRAVENOUS
  Filled 2018-10-07: qty 250

## 2018-10-07 MED ORDER — FAMOTIDINE IN NACL 20-0.9 MG/50ML-% IV SOLN
20.0000 mg | Freq: Once | INTRAVENOUS | Status: AC
  Administered 2018-10-07: 20 mg via INTRAVENOUS

## 2018-10-07 MED ORDER — SODIUM CHLORIDE 0.9% FLUSH
10.0000 mL | INTRAVENOUS | Status: DC | PRN
  Administered 2018-10-07: 10 mL
  Filled 2018-10-07: qty 10

## 2018-10-07 MED ORDER — DIPHENHYDRAMINE HCL 25 MG PO CAPS
ORAL_CAPSULE | ORAL | Status: AC
  Filled 2018-10-07: qty 2

## 2018-10-07 MED ORDER — ACETAMINOPHEN 325 MG PO TABS
650.0000 mg | ORAL_TABLET | Freq: Once | ORAL | Status: AC
  Administered 2018-10-07: 650 mg via ORAL

## 2018-10-07 MED ORDER — SODIUM CHLORIDE 0.9 % IV SOLN
900.0000 mg | Freq: Once | INTRAVENOUS | Status: AC
  Administered 2018-10-07: 900 mg via INTRAVENOUS
  Filled 2018-10-07: qty 40

## 2018-10-07 MED ORDER — ZOLEDRONIC ACID 4 MG/100ML IV SOLN
4.0000 mg | Freq: Once | INTRAVENOUS | Status: AC
  Administered 2018-10-07: 4 mg via INTRAVENOUS
  Filled 2018-10-07: qty 100

## 2018-10-07 MED ORDER — SODIUM CHLORIDE 0.9% FLUSH
10.0000 mL | Freq: Once | INTRAVENOUS | Status: AC
  Administered 2018-10-07: 10 mL
  Filled 2018-10-07: qty 10

## 2018-10-07 MED ORDER — SODIUM CHLORIDE 0.9 % IV SOLN
20.0000 mg | Freq: Once | INTRAVENOUS | Status: AC
  Administered 2018-10-07: 20 mg via INTRAVENOUS
  Filled 2018-10-07: qty 20

## 2018-10-07 NOTE — Patient Instructions (Signed)
[place] Discharge Instructions for Patients Receiving Chemotherapy  Today you received the following chemotherapy agents: Darzalex and Zometa  To help prevent nausea and vomiting after your treatment, we encourage you to take your nausea medication as directed.   If you develop nausea and vomiting that is not controlled by your nausea medication, call the clinic.   BELOW ARE SYMPTOMS THAT SHOULD BE REPORTED IMMEDIATELY:  *FEVER GREATER THAN 100.5 F  *CHILLS WITH OR WITHOUT FEVER  NAUSEA AND VOMITING THAT IS NOT CONTROLLED WITH YOUR NAUSEA MEDICATION  *UNUSUAL SHORTNESS OF BREATH  *UNUSUAL BRUISING OR BLEEDING  TENDERNESS IN MOUTH AND THROAT WITH OR WITHOUT PRESENCE OF ULCERS  *URINARY PROBLEMS  *BOWEL PROBLEMS  UNUSUAL RASH Items with * indicate a potential emergency and should be followed up as soon as possible.  Feel free to call the clinic should you have any questions or concerns. The clinic phone number is [phone].  Please show the [place] at check-in to the Emergency Department and triage nurse.

## 2018-10-07 NOTE — Progress Notes (Signed)
Per Dr. Irene Limbo, okay for patient to receive treatment today with ANC 0.6. Pomalidomide to be held an additional week. Patient aware.

## 2018-10-13 ENCOUNTER — Encounter (HOSPITAL_COMMUNITY)
Admission: RE | Admit: 2018-10-13 | Discharge: 2018-10-13 | Disposition: A | Discharge status: Home / Self Care | Payer: 59 | Source: Ambulatory Visit | Attending: Hematology | Admitting: Hematology

## 2018-10-13 ENCOUNTER — Telehealth: Payer: Self-pay | Admitting: *Deleted

## 2018-10-13 ENCOUNTER — Other Ambulatory Visit: Payer: Self-pay

## 2018-10-13 DIAGNOSIS — C9000 Multiple myeloma not having achieved remission: Secondary | ICD-10-CM | POA: Insufficient documentation

## 2018-10-13 LAB — GLUCOSE, CAPILLARY: Glucose-Capillary: 90 mg/dL (ref 70–99)

## 2018-10-13 MED ORDER — FLUDEOXYGLUCOSE F - 18 (FDG) INJECTION
6.1000 | Freq: Once | INTRAVENOUS | Status: AC | PRN
  Administered 2018-10-13: 6.1 via INTRAVENOUS

## 2018-10-13 NOTE — Telephone Encounter (Signed)
"  Jesus [name] with radiology.   Today's PET scan may be in EPIC.  Dr. Ilda Foil wants call notification of impression one and two.   IMPRESSION: 1. Mixed response to therapy with some lesions decreased significantly in metabolic activity and others increased in metabolic activity as well size on the CT portion exam. 2. Expansion of lytic lesion at C3 with loss of a large portion of the vertebral body structure and intense metabolic activity. Recommend non emergent neuro surgical consultation for evaluation of this destructive C3 lesion. Dedicated CT or MRI of the neck may also provide more information."

## 2018-10-14 NOTE — Telephone Encounter (Signed)
Provider in clinic per scribe.  Call report placed on desk keyboard.

## 2018-10-17 ENCOUNTER — Telehealth: Payer: Self-pay | Admitting: Hematology

## 2018-10-17 ENCOUNTER — Telehealth: Payer: Self-pay | Admitting: *Deleted

## 2018-10-17 ENCOUNTER — Other Ambulatory Visit: Payer: Self-pay | Admitting: Hematology

## 2018-10-17 DIAGNOSIS — D492 Neoplasm of unspecified behavior of bone, soft tissue, and skin: Secondary | ICD-10-CM

## 2018-10-17 DIAGNOSIS — C9000 Multiple myeloma not having achieved remission: Secondary | ICD-10-CM

## 2018-10-17 NOTE — Telephone Encounter (Signed)
Patient called: He had a  PET completed on 9/10. He is experiencing severe neck pain 7/10 - oxycodone reduces to 4/10. Pt states he is scared - and wants to know if PET indicates anything in his neck? Also asked if he should restart Pomalyst - had held for additional week and that second week ends tomorrow. Dr. Irene Limbo provided information and questions.

## 2018-10-17 NOTE — Telephone Encounter (Signed)
I called patient with his PET/CT results.  He notes increasing neck pain.  PET CT scan shows expansile growing C3 lesion that appears to possibly need consideration for surgical stabilization. -I recommended that he avoid any significant neck bending movements. -Stat MRI of the cervical spine for further evaluation. -Urgent neurosurgery consultation placed to see Dr. Vertell Limber at [facility] neurosurgery. -We shall be following up in clinic on 10/21/2018 when he comes for his next treatment.  Jesus [name]

## 2018-10-18 ENCOUNTER — Telehealth: Payer: Self-pay | Admitting: *Deleted

## 2018-10-18 NOTE — Telephone Encounter (Signed)
Per Dr. Irene Limbo, he contacted patient to review PET/CT results, recommending that he avoid any significant neck bending movements. Stat MRI ordered of the cervical spine for further evaluation. Urgent neurosurgery consultation placed to see Dr. Vertell Limber at [facility] Neurosurgery." Jesus [name] (Dr. Melven Sartorius scheduler) at [facility]. She asked that referral be faxed to [phone], referral faxed and confirmation received.

## 2018-10-18 NOTE — Telephone Encounter (Signed)
Contacted patient to inform him that MRI ordered by Dr. Irene Limbo is authorized and that if he has not heard from Radiology scheduling in 1-2 days, please call [phone] to schedule. Patient verbalized understanding

## 2018-10-21 ENCOUNTER — Inpatient Hospital Stay (HOSPITAL_BASED_OUTPATIENT_CLINIC_OR_DEPARTMENT_OTHER): Payer: 59 | Admitting: Hematology

## 2018-10-21 ENCOUNTER — Inpatient Hospital Stay: Payer: 59

## 2018-10-21 ENCOUNTER — Other Ambulatory Visit: Payer: Self-pay

## 2018-10-21 VITALS — BP 146/92 | HR 86 | Temp 98.5°F | Resp 18 | Ht 67.0 in | Wt 122.2 lb

## 2018-10-21 VITALS — BP 129/78 | HR 91 | Temp 97.8°F | Resp 18

## 2018-10-21 DIAGNOSIS — C9000 Multiple myeloma not having achieved remission: Secondary | ICD-10-CM

## 2018-10-21 DIAGNOSIS — Z0000 Encounter for general adult medical examination without abnormal findings: Secondary | ICD-10-CM

## 2018-10-21 DIAGNOSIS — D492 Neoplasm of unspecified behavior of bone, soft tissue, and skin: Secondary | ICD-10-CM | POA: Diagnosis not present

## 2018-10-21 DIAGNOSIS — Z5112 Encounter for antineoplastic immunotherapy: Secondary | ICD-10-CM

## 2018-10-21 DIAGNOSIS — Z7189 Other specified counseling: Secondary | ICD-10-CM

## 2018-10-21 LAB — CBC WITH DIFFERENTIAL (CANCER CENTER ONLY)
Abs Immature Granulocytes: 0 10*3/uL (ref 0.00–0.07)
Basophils Absolute: 0 10*3/uL (ref 0.0–0.1)
Basophils Relative: 1 %
Eosinophils Absolute: 0 10*3/uL (ref 0.0–0.5)
Eosinophils Relative: 1 %
HCT: 33.6 % — ABNORMAL LOW (ref 39.0–52.0)
Hemoglobin: 11.3 g/dL — ABNORMAL LOW (ref 13.0–17.0)
Immature Granulocytes: 0 %
Lymphocytes Relative: 20 %
Lymphs Abs: 0.7 10*3/uL (ref 0.7–4.0)
MCH: 31.3 pg (ref 26.0–34.0)
MCHC: 33.6 g/dL (ref 30.0–36.0)
MCV: 93.1 fL (ref 80.0–100.0)
Monocytes Absolute: 0.4 10*3/uL (ref 0.1–1.0)
Monocytes Relative: 12 %
Neutro Abs: 2.4 10*3/uL (ref 1.7–7.7)
Neutrophils Relative %: 66 %
Platelet Count: 130 10*3/uL — ABNORMAL LOW (ref 150–400)
RBC: 3.61 MIL/uL — ABNORMAL LOW (ref 4.22–5.81)
RDW: 15.9 % — ABNORMAL HIGH (ref 11.5–15.5)
WBC Count: 3.7 10*3/uL — ABNORMAL LOW (ref 4.0–10.5)
nRBC: 0 % (ref 0.0–0.2)

## 2018-10-21 LAB — CMP (CANCER CENTER ONLY)
ALT: 24 U/L (ref 0–44)
AST: 20 U/L (ref 15–41)
Albumin: 4 g/dL (ref 3.5–5.0)
Alkaline Phosphatase: 74 U/L (ref 38–126)
Anion gap: 10 (ref 5–15)
BUN: 10 mg/dL (ref 8–23)
CO2: 27 mmol/L (ref 22–32)
Calcium: 9.5 mg/dL (ref 8.9–10.3)
Chloride: 102 mmol/L (ref 98–111)
Creatinine: 0.71 mg/dL (ref 0.61–1.24)
GFR, Est AFR Am: >60 mL/min (ref >60)
GFR, Estimated: >60 mL/min (ref >60)
Glucose, Bld: 122 mg/dL — ABNORMAL HIGH (ref 70–99)
Potassium: 3.8 mmol/L (ref 3.5–5.1)
Sodium: 139 mmol/L (ref 135–145)
Total Bilirubin: 0.4 mg/dL (ref 0.3–1.2)
Total Protein: 6.5 g/dL (ref 6.5–8.1)

## 2018-10-21 MED ORDER — INFLUENZA VAC SPLIT QUAD 0.5 ML IM SUSY
PREFILLED_SYRINGE | INTRAMUSCULAR | Status: AC
  Filled 2018-10-21: qty 0.5

## 2018-10-21 MED ORDER — INFLUENZA VAC SPLIT QUAD 0.5 ML IM SUSY
0.5000 mL | PREFILLED_SYRINGE | Freq: Once | INTRAMUSCULAR | Status: AC
  Administered 2018-10-21: 0.5 mL via INTRAMUSCULAR

## 2018-10-21 MED ORDER — ACYCLOVIR 400 MG PO TABS: 400.0000 mg | ORAL_TABLET | Freq: Two times a day (BID) | ORAL | 2 refills | Status: AC

## 2018-10-21 MED ORDER — DIPHENHYDRAMINE HCL 25 MG PO CAPS
ORAL_CAPSULE | ORAL | Status: AC
  Filled 2018-10-21: qty 2

## 2018-10-21 MED ORDER — HEPARIN SOD (PORK) LOCK FLUSH 100 UNIT/ML IV SOLN
500.0000 [IU] | Freq: Once | INTRAVENOUS | Status: AC | PRN
  Administered 2018-10-21: 500 [IU]
  Filled 2018-10-21: qty 5

## 2018-10-21 MED ORDER — SODIUM CHLORIDE 0.9 % IV SOLN
Freq: Once | INTRAVENOUS | Status: AC
  Administered 2018-10-21: 11:00:00 via INTRAVENOUS
  Filled 2018-10-21: qty 250

## 2018-10-21 MED ORDER — ACETAMINOPHEN 325 MG PO TABS
ORAL_TABLET | ORAL | Status: AC
  Filled 2018-10-21: qty 2

## 2018-10-21 MED ORDER — SODIUM CHLORIDE 0.9 % IV SOLN
900.0000 mg | Freq: Once | INTRAVENOUS | Status: AC
  Administered 2018-10-21: 900 mg via INTRAVENOUS
  Filled 2018-10-21: qty 40

## 2018-10-21 MED ORDER — SODIUM CHLORIDE 0.9% FLUSH
10.0000 mL | INTRAVENOUS | Status: DC | PRN
  Administered 2018-10-21: 10 mL
  Filled 2018-10-21: qty 10

## 2018-10-21 MED ORDER — FAMOTIDINE IN NACL 20-0.9 MG/50ML-% IV SOLN
20.0000 mg | Freq: Once | INTRAVENOUS | Status: AC
  Administered 2018-10-21: 20 mg via INTRAVENOUS

## 2018-10-21 MED ORDER — DIPHENHYDRAMINE HCL 25 MG PO CAPS
50.0000 mg | ORAL_CAPSULE | Freq: Once | ORAL | Status: AC
  Administered 2018-10-21: 50 mg via ORAL

## 2018-10-21 MED ORDER — FAMOTIDINE IN NACL 20-0.9 MG/50ML-% IV SOLN
INTRAVENOUS | Status: AC
  Filled 2018-10-21: qty 50

## 2018-10-21 MED ORDER — SODIUM CHLORIDE 0.9 % IV SOLN
20.0000 mg | Freq: Once | INTRAVENOUS | Status: AC
  Administered 2018-10-21: 20 mg via INTRAVENOUS
  Filled 2018-10-21: qty 2

## 2018-10-21 MED ORDER — OXYCODONE HCL 5 MG PO TABS: 5.0000 mg | ORAL_TABLET | ORAL | 0 refills | Status: DC | PRN

## 2018-10-21 MED ORDER — ACETAMINOPHEN 325 MG PO TABS
650.0000 mg | ORAL_TABLET | Freq: Once | ORAL | Status: AC
  Administered 2018-10-21: 650 mg via ORAL

## 2018-10-21 NOTE — Progress Notes (Signed)
HEMATOLOGY/ONCOLOGY CLINIC NOTE  Date of Service: 10/21/2018  Patient Care Team: Patient, No Pcp Per as PCP - General (General Practice)  CHIEF COMPLAINTS/PURPOSE OF CONSULTATION:   Continued management of Multiple Myeloma  Oncologic History:   Jesus [name] was diagnosed with multiple myeloma in July 2018 in [place]. He initially developed mid back pain in May 2018 and was seen to have a T10 vertebral body mass with additional lytic lesion at T9. His initial M spike in July 2018 was 1.5g with IgG Lambda specificity and IgG elevated at 2259m. His July 2018 BM Bx revealed 20% monoclonal plasma cells and FISH revealed a 17p deletion. He received 4 fractions of palliative RT, however his back pain worsened and developed bilateral lower extremity numbness and tingling, and was then seen to have a T10 pathologic fracture with cord compression s/p T10 corpectomy. The pt began VRd on 09/24/16, completed 7 cycles, then [facility], post transplant complicated by neutropenic colitis and deconditioning. Repeat BM Bx on 04/21/17 revealed residual plasma cell with less than 5% lambda light chain restricted plasma cells, and a post-transplant M spike of 0.4g. The pt then began maintenance 580mm2 Carfilzomib every 2 weeks on 06/16/17.  HISTORY OF PRESENTING ILLNESS:   Jesus [name] a wonderful [age].o. male who has been referred to [place] Dr. BePhil Doppor evaluation and management of Multiple Myeloma. The pt reports that he is doing well overall.  The [place], the pt has moved back to his home which is here locally, and is transferring his care here now as well. The pt notes that he is anticipating C11D1 maintenance Carfilzomib today. The pt notes that his most recent M  spike was 0.2g. His last BM biopsy was March 2019, as noted above. He denies any infection issues in the last year. The pt notes that he has received all of is post-transplant vaccinations. [facility]  The pt reports that he continues to have back pain. He has been receiving Zometa every 4 weeks, but was recently transitioned to every 3 months, he denies dental concerns at this time. He is taking Calcium and Vitamin D replacement.   The pt has been taking Marinol for appetite stimulation.   The pt notes that he has an enlarged prostate, which was seen on imaging. He notes that he was expecting to receive a PSA test soon.  The pt notes that prior to his Multiple Myeloma diagnosis in July 2018, he had a "clean bill of health." He denies DM, HTN, lung problems, heart problems, thyroid problems or other medical concerns.  Most recent lab results (03/23/18) of CBC is as follows: all values are WNL except for RBC at 4.03, HGB at 11.5, HCT at 35.6%, MPV at 8.9, Glucose at 135, Total Protein at 6.2.  On review of systems, pt reports chronic back pain, stable energy levels, and denies concerns for infections, new pain along the spine, abdominal pains, leg swelling, and any other symptoms.   On PMHx the pt reports Multiple Myeloma, denies HTN, HLD, lung problems, strokes, seizures, heart problems and thyroid problems. On Social Hx the pt reports working for the [employer] Interval History:  Jesus [name] returns today for management and evaluation of his multiple myeloma not in remission. The patient's last visit with [place] was on 09/23/2018. The pt reports that he is doing well overall.  The pt reports increased neck pain and poor appetite but denies neuropathy, fevers, back pain, and chills. He states that he uses lidocaine but it does not help. Pt only gets relief from Oxycodone.  Of note since the patient's last visit, pt has had PET  whole body completed on 10/13/2018 with results revealing "1. Mixed response to therapy with some lesions decreased significantly in metabolic activity and others increased in metabolic activity as well size on the CT portion exam. 2. Expansion of lytic lesion at C3 with loss of a large portion of the vertebral body structure and intense metabolic activity. Recommend non emergent neuro surgical consultation for evaluation of this destructive C3 lesion. Dedicated CT or MRI of the neck may also provide more information. 3. Increase in size and metabolic activity of RIGHT iliac lesion and T5 vertebral body lesion. 4. Marked improvement with reduction in metabolic activity of RIGHT humerus and scapular metastatic lesions as well as marked reduction in metabolic activity large LEFT iliac bone lesion. 5. No evidence of soft tissue plasmacytoma or metastasis."  Lab results today (10/21/2018) of CBC w/diff and CMP is as follows: all values are WNL except for Glucose at 122, WBC at 3.7, RBC at 3.61, Hemoglobin at 11.3, HCT at 33.6, RDW at 15.9, Platelets at 124,Platelet Count at 130, Neutro Abs at 0.6, Total Protein at 6.3,and  AST at 12.  On review of systems, pt reports neck pain, poor appetite   and denies back pain, fever, chills, neuropathy and any other symptoms.    MEDICAL HISTORY:  Past Medical History:  Diagnosis Date   Cancer [facility])     SURGICAL HISTORY: Past Surgical History:  Procedure Laterality Date   BACK SURGERY     IR IMAGING GUIDED PORT INSERTION  06/21/2018    SOCIAL HISTORY: Social History   Socioeconomic History   Marital status: Married    Spouse name: Not on file   Number of children: Not on file   Years of education: Not on file   Highest education level: Not on file  Occupational History   Not on file  Social Needs   Financial resource strain: Not on file   Food insecurity    Worry: Not on file    Inability: Not on file   Transportation needs    Medical:  No    Non-medical: No  Tobacco Use   Smoking status: Never Smoker   Smokeless tobacco: Never Used  Substance and Sexual Activity   Alcohol use: Never    Frequency: Never   Drug use: Never   Sexual activity: Not on file  Lifestyle   Physical activity    Days per week: Not on file    Minutes per session: Not on file   Stress: Not on file  Relationships   Social connections    Talks on phone: Not on file    Gets together: Not on file    Attends religious service: Not on file    Active member of club or organization: Not on file    Attends meetings of clubs or organizations: Not on file    Relationship status: Not on file   Intimate partner violence    Fear of current or ex partner: Not on file    Emotionally abused: Not on  file    Physically abused: Not on file    Forced sexual activity: Not on file  Other Topics Concern   Not on file  Social History Narrative   Not on file    FAMILY HISTORY: Family History  Problem Relation Age of Onset   Breast cancer Sister     ALLERGIES:  is allergic to penicillins.  MEDICATIONS:  Current Outpatient Medications  Medication Sig Dispense Refill   acetaminophen (TYLENOL) 325 MG tablet Take by mouth.     acyclovir (ZOVIRAX) 400 MG tablet Take 1 tablet (400 mg total) by mouth 2 (two) times daily. 60 tablet 2   acyclovir (ZOVIRAX) 400 MG tablet Take 1 tablet (400 mg total) by mouth 2 (two) times daily. 60 tablet 11   aspirin 81 MG EC tablet Take by mouth.     atovaquone-proguanil (MALARONE) 250-100 MG TABS tablet TAKE 1 TABLET BY MOUTH ONCE DAILY FOR 25 DAYS BEGIN 1 2 DAYS BEFORE AND CONTINUE UNTIL 1 WEEK AFTER EXPOSURE FOR PREVENTION OF MALARIA     buPROPion (WELLBUTRIN SR) 150 MG 12 hr tablet 1 pill a day for 3 days.  Then BID     Calcium Carb-Cholecalciferol (CALCIUM 1000 + D) 1000-800 MG-UNIT TABS Take 1,000 mg by mouth once.     Cholecalciferol (VITAMIN D3) 25 MCG (1000 UT) CAPS Take by mouth.      ciprofloxacin (CIPRO) 500 MG tablet TAKE 1 TABLET BY MOUTH TWICE DAILY FOR 5 DAYS MAY TAKE FOR 3 5 DAYS FOR TRAVELERS DIARRHEA MAY DISCONTINUE WHEN SYMPTOMS RESOLVED     dexamethasone (DECADRON) 4 MG tablet Take 5 tablets (15m) with breakfast the day after each dose of Daratumumab 120 tablet 1   doxycycline (VIBRA-TABS) 100 MG tablet Take by mouth.     dronabinol (MARINOL) 10 MG capsule TAKE 1 CAPSULE BY MOUTH TWICE DAILY BEFORE MEAL(S) 60 capsule 0   ergocalciferol (VITAMIN D2) 1.25 MG (50000 UT) capsule Take 1 capsule (50,000 Units total) by mouth 2 (two) times a week. 24 capsule 6   gabapentin (NEURONTIN) 300 MG capsule Take 1 capsule (300 mg total) by mouth 3 (three) times daily. 90 capsule 2   lidocaine-prilocaine (EMLA) cream Apply to affected area once 30 g 3   LORazepam (ATIVAN) 0.5 MG tablet Take by mouth.     morphine (MS CONTIN) 15 MG 12 hr tablet Take 1 tablet (15 mg total) by mouth every 12 (twelve) hours. 60 tablet 0   Multiple Vitamins-Minerals (ONE DAILY CALCIUM/IRON) TABS Take by mouth.     ondansetron (ZOFRAN) 4 MG tablet Take 1 tablet (4 mg total) by mouth every 8 (eight) hours as needed for nausea or vomiting. 30 tablet 0   ondansetron (ZOFRAN) 8 MG tablet Take 1 tablet (8 mg total) by mouth 2 (two) times daily as needed (Nausea or vomiting). 30 tablet 1   ondansetron (ZOFRAN-ODT) 4 MG disintegrating tablet Place under the tongue.     oxyCODONE (OXY IR/ROXICODONE) 5 MG immediate release tablet Take 1 tablet (5 mg total) by mouth every 4 (four) hours as needed for severe pain. 120 tablet 0   POMALYST 2 MG capsule TAKE 1 CAPSULE BY MOUTH  DAILY FOR 21 DAYS, THEN 7  DAYS OFF TAKE WITH WATER 21 capsule 0   prochlorperazine (COMPAZINE) 10 MG tablet Take 1 tablet (10 mg total) by mouth every 6 (six) hours as needed (Nausea or vomiting). 30 tablet 1   senna-docusate (SENOKOT-S) 8.6-50 MG tablet Take by mouth.  sucralfate (CARAFATE) 1 g tablet Take 1 tablet (1 g  total) by mouth 4 (four) times daily. 120 tablet 0   traMADol (ULTRAM) 50 MG tablet TAKE 1 TABLET BY MOUTH EVERY 6 HOURS AS NEEDED FOR MODERATE PAIN OR SEVERE PAIN     No current facility-administered medications for this visit.     REVIEW OF SYSTEMS:    A 10+ POINT REVIEW OF SYSTEMS WAS OBTAINED including neurology, dermatology, psychiatry, cardiac, respiratory, lymph, extremities, GI, GU, Musculoskeletal, constitutional, breasts, reproductive, HEENT.  All pertinent positives are noted in the HPI.  All others are negative.   PHYSICAL EXAMINATION: ECOG PERFORMANCE STATUS: 1 - Symptomatic but completely ambulatory  Vitals:   10/21/18 0856  BP: (!) 146/92  Pulse: 86  Resp: 18  Temp: 98.5 F (36.9 C)  SpO2: 100%   Filed Weights   10/21/18 0856  Weight: 122 lb 3.2 oz (55.4 kg)   .Body mass index is 19.14 kg/m.  GENERAL:alert, in no acute distress and comfortable SKIN: no acute rashes, no significant lesions EYES: conjunctiva are pink and non-injected, sclera anicteric OROPHARYNX: MMM, no exudates, no oropharyngeal erythema or ulceration NECK: supple, no JVD LYMPH:  no palpable lymphadenopathy in the cervical, axillary or inguinal regions. LUNGS: clear to auscultation b/l with normal respiratory effort HEART: regular rate & rhythm ABDOMEN:  normoactive bowel sounds , non tender, not distended. No palpable hepatosplenomegaly.  Extremity: no pedal edema PSYCH: alert & oriented x 3 with fluent speech NEURO: no focal motor/sensory deficits   LABORATORY DATA:  I have reviewed the data as listed  . CBC Latest Ref Rng & Units 10/21/2018 10/07/2018 09/23/2018  WBC 4.0 - 10.5 K/uL 3.7(L) 2.0(L) 3.8(L)  Hemoglobin 13.0 - 17.0 g/dL 11.3(L) 10.6(L) 10.7(L)  Hematocrit 39.0 - 52.0 % 33.6(L) 32.3(L) 32.2(L)  Platelets 150 - 400 K/uL 130(L) 124(L) 83(L)    . CMP Latest Ref Rng & Units 10/21/2018 10/07/2018 09/23/2018  Glucose 70 - 99 mg/dL 122(H) 91 117(H)  BUN 8 - 23 mg/dL '10 10 12    ' Creatinine 0.61 - 1.24 mg/dL 0.71 0.62 0.73  Sodium 135 - 145 mmol/L 139 138 137  Potassium 3.5 - 5.1 mmol/L 3.8 4.4 3.8  Chloride 98 - 111 mmol/L 102 108 105  CO2 22 - 32 mmol/L 27 21(L) 23  Calcium 8.9 - 10.3 mg/dL 9.5 9.1 8.9  Total Protein 6.5 - 8.1 g/dL 6.5 6.3(L) 6.4(L)  Total Bilirubin 0.3 - 1.2 mg/dL 0.4 0.4 0.6  Alkaline Phos 38 - 126 U/L 74 84 86  AST 15 - 41 U/L 20 12(L) 12(L)  ALT 0 - 44 U/L '24 17 16    ' 06/30/18 BM Biopsy:    08/22/2018 Bone Marrow Biopsy     RADIOGRAPHIC STUDIES: I have personally reviewed the radiological images as listed and agreed with the findings in the report. Nm Pet Image Restage (ps) Whole Body  Result Date: 10/13/2018 CLINICAL DATA:  Subsequent treatment strategy for multiple myeloma. EXAM: NUCLEAR MEDICINE PET WHOLE BODY TECHNIQUE: 6.1 mCi F-18 FDG was injected intravenously. Full-ring PET imaging was performed from the skull base to thigh after the radiotracer. CT data was obtained and used for attenuation correction and anatomic localization. Fasting blood glucose: 90 mg/dl COMPARISON:  PET scan 06/29/2018 FINDINGS: Mediastinal blood pool activity: SUV max 2.23 HEAD/NECK: Bilateral symmetric hypermetabolic activity in the parotid glands is favored benign. Incidental CT findings: none CHEST: No hypermetabolic mediastinal or hilar nodes. No suspicious pulmonary nodules on the CT scan. Incidental CT findings:  Port in the anterior chest wall with tip in distal SVC. ABDOMEN/PELVIS: No abnormal hypermetabolic activity within the liver, pancreas, adrenal glands, or spleen. No hypermetabolic lymph nodes in the abdomen or pelvis. Incidental CT findings: Prostate enlarged. SKELETON: Mixed response to therapy. The several lesions are increased in metabolic activity well other skeletal lesions are decreased. Majority lesions are associated with ground-glass densities within the medullary space. For example increase lesions include: RIGHT iliac wing lesion SUV  max equal 7.1 increased from 6.0. The size of lesion is increased by PET imaging. Increased metabolic activity in the T5 vertebral body with SUV max equal 5.6. Lesion difficult define on the CT portion exam. Lesion in the C3 vertebral body which partially lytic with SUV max equal 8.6 is similar intensity to SUV max equal 8.9 however the destructive portion lesion on CT exam is considerably increased as well as the size of lesion involving with metabolic activity. Loss of C3 vertebral body structure best seen on sagittal image. Also seen on axial image 53/4. Lesions which are improved include cluster of metastatic lesions in the RIGHT humeral head and glenoid fossa of the scapula which are much reduced in metabolic activity SUV max equal 3.9 decreased from 8.3. Lesion in the LEFT iliac bone with SUV max equal 11.3 and measuring approximately 3.7 cm is much reduced to SUV max equal 3.3. Large lytic lesion remaining. Incidental CT findings: Large benign fatty lesion in the muscles of the LEFT anterior thigh without metabolic activity. EXTREMITIES: No evidence of metastatic disease in the lower extremities. Incidental CT findings: none IMPRESSION: 1. Mixed response to therapy with some lesions decreased significantly in metabolic activity and others increased in metabolic activity as well size on the CT portion exam. 2. Expansion of lytic lesion at C3 with loss of a large portion of the vertebral body structure and intense metabolic activity. Recommend non emergent neuro surgical consultation for evaluation of this destructive C3 lesion. Dedicated CT or MRI of the neck may also provide more information. 3. Increase in size and metabolic activity of RIGHT iliac lesion and T5 vertebral body lesion. 4. Marked improvement with reduction in metabolic activity of RIGHT humerus and scapular metastatic lesions as well as marked reduction in metabolic activity large LEFT iliac bone lesion. 5. No evidence of soft tissue  plasmacytoma or metastasis. These results will be called to the ordering clinician or representative by the Radiologist Assistant, and communication documented in the PACS or zVision Dashboard. Electronically Signed   By: Suzy Bouchard M.D.   On: 10/13/2018 13:03    ASSESSMENT & PLAN:  63 y.o. male with  1. 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 IgG at 2220. 08/11/16 PET/CT revealed Hypermetabolic large soft tissue mass in the lower thoracic paraspinal region associated with lytic destruction of T10 vertebral body, concerning for multiple myeloma. Additional smaller lytic lesions involving the skeleton. S/p RT x 4 fractions, discontinued due to T10 pathologic fracture with severe cord compression S/p 08/26/16 T10 corpectomy and T8-L1 posterior spinal fusion   Began 7 cycles of VRd on 09/24/16 S/p autologous stem cell transplant, Day 0 on 02/23/17 04/21/17 BM Bx with residual less than 5% lambda light chain restricted plasma cells in the bone marrow. M Spike at 0.4g Began maintenance 46m/m2 Carfilzomib every 2 weeks on 06/16/17  06/09/18  PET/CT revealed "Multifocal hypermetabolic lytic lesions in the skeleton as noted above, compatible with active myeloma. In the spine, the most notable active lesion of concern is at the T12 level with there is a large right eccentric vertebral body lesion with demineralization of the cortex and likely some paravertebral extension of tumor. This could cause loosening of the right pedicle screw. Intraspinal extension of tumor is difficult to exclude. MRI might be considered although the posterolateral rod and pedicle screws may cause artifact. 2. There is likewise cortical breakthrough associated with the lytic lesion along the left upper acetabulum. 3. A lesion in the left  intertrochanteric area of the femur is large enough to potentially cause biomechanical weakening which increases risk of fracture. 4. Other skeletal sites of active involvement are detailed above in the skeleton section. 5. Several lucent lesions are observed including the calvarium, T1 vertebral body, and left L4 pedicle which are not hypermetabolic and may represent effectively previously treated lesions. 6. 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 There is also a lipoma along the right brachialis muscle. 7. Nonspecific subcutaneous edema especially in the right forearm but also extending up into the right upper arm. Cause is uncertain. There is some low-grade nonfocal metabolic activity along this area. Strictly speaking, cellulitis is not excluded, correlate with clinical history and visual inspection. 8. Other imaging findings of potential clinical significance: Aortic Atherosclerosis. Mild cardiomegaly. Cholelithiasis. Prostatomegaly."  06/17/18 MMP revealed M Protein at 0.5g, just before beginning C1 Daratumumab  06/30/18 BM Biopsy revealed normocellular marrow with minimal involvement by plasma cell neoplasm (5% plasma cells), normal cytogenetics.  06/30/18 Molecular Cytogenetics did not have enough material for testing.  10/13/2018 PET whole body revealed "1. Mixed response to therapy with some lesions decreased significantly in metabolic activity and others increased in metabolic activity as well size on the CT portion exam. 2. Expansion of lytic lesion at C3 with loss of a large portion of the vertebral body structure and intense metabolic activity. Recommend non emergent neuro surgical consultation for evaluation of this destructive C3 lesion. Dedicated CT or MRI of the neck may also provide more information. 3. Increase in size and metabolic activity of RIGHT iliac lesion and T5 vertebral body lesion. 4. Marked improvement with  reduction in metabolic activity of RIGHT humerus and scapular metastatic lesions as well as marked reduction in metabolic activity large LEFT iliac bone lesion. 5. No evidence of soft tissue plasmacytoma or metastasis."  PLAN: -Discussed pt labwork today, 10/21/2018;  all values are WNL except for Glucose at 122, WBC at 3.7, RBC at 3.61, Hemoglobin at 11.3, HCT at 33.6, RDW at 15.9, Platelets at 124,Platelet Count at 130, Neutro Abs at 0.6, Total Protein at 6.3,and  AST at 12. -MRI scheduled for 10/26/2018 -Nerosurgery  consult scheduled  -Discussed 10/13/2018 PET whole body which revealed 1. Mixed response to therapy with some lesions decreased significantly in metabolic activity and others increased in metabolic activity as well size on the CT portion exam. 2. Expansion of lytic lesion at C3 with loss of a large portion of the vertebral body structure and intense metabolic activity. Recommend non emergent neuro surgical consultation for evaluation of this destructive C3 lesion. Dedicated CT or MRI of the neck may also provide more information. 3. Increase in size and metabolic activity of RIGHT iliac lesion and T5 vertebral body lesion. 4. Marked improvement with reduction in metabolic activity of RIGHT humerus and  scapular metastatic lesions as well as marked reduction in metabolic activity large LEFT iliac bone lesion. 5. No evidence of soft tissue plasmacytoma or metastasis. -Discussed that the spot on the pt neck is of most concern and is top priority. Myeloma spot on neck is not response and rapidly changing. -MRI c spine ASAP -referred to Dr Vertell Limber from neurosurgery for consideration of C3 stabilization.  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 Keep daratumumab  and switch to an alternative medication -Suggested follow up with Dr.Rodriguez before January to discuss further options for 17 P myeloma such as clinical trials. -Pt recommended to decrease movement of  the neck -Advised to contact us if symptoms increase -Recommended over the counter cervical neck collar.  -Rx high dose Vitamin D 2x a week -Will see the pt back in 6 weeks   FOLLOW UP: Please schedule C6 of chemotherapy Labs with each visit with port flush RTC with Dr Irene Limbo with [phone] of treatment Patient has not heard back from neurosurgery for urgent outpatient appointment- will need to f/u on that.   All of the patients questions were answered with apparent satisfaction. The patient knows to call the clinic with any problems, questions or concerns.  The total time spent in the appt was 25 minutes and more than 50% was on counseling and direct patient cares.  All of the patient's questions were answered with apparent satisfaction. The patient knows to call the clinic with any problems, questions or concerns.   Sullivan Lone MD MS AAHIVMS [facility] Hematology/Oncology Physician [facility]  (Office):       [phone] (Work cell):  [phone] (Fax):           [phone]  10/21/2018 8:46 AM  I, Jacqualyn Posey, am acting as a Education administrator for Dr. Sullivan Lone.   .I have reviewed the above documentation for accuracy and completeness, and I agree with the above. Brunetta Genera MD

## 2018-10-21 NOTE — Patient Instructions (Signed)
[facility] Discharge Instructions for Patients Receiving Chemotherapy  Today you received the following chemotherapy agents: Darzalex  To help prevent nausea and vomiting after your treatment, we encourage you to take your nausea medication as directed.    If you develop nausea and vomiting that is not controlled by your nausea medication, call the clinic.   BELOW ARE SYMPTOMS THAT SHOULD BE REPORTED IMMEDIATELY:  *FEVER GREATER THAN 100.5 F  *CHILLS WITH OR WITHOUT FEVER  NAUSEA AND VOMITING THAT IS NOT CONTROLLED WITH YOUR NAUSEA MEDICATION  *UNUSUAL SHORTNESS OF BREATH  *UNUSUAL BRUISING OR BLEEDING  TENDERNESS IN MOUTH AND THROAT WITH OR WITHOUT PRESENCE OF ULCERS  *URINARY PROBLEMS  *BOWEL PROBLEMS  UNUSUAL RASH Items with * indicate a potential emergency and should be followed up as soon as possible.  Feel free to call the clinic should you have any questions or concerns. The clinic phone number is [phone].  Please show the CHEMO ALERT CARD at check-in to the Emergency Department and triage nurse.   

## 2018-10-24 ENCOUNTER — Telehealth: Payer: Self-pay | Admitting: Hematology

## 2018-10-24 ENCOUNTER — Ambulatory Visit (HOSPITAL_COMMUNITY): Payer: 59

## 2018-10-24 NOTE — Telephone Encounter (Signed)
Scheduled appt per 9/18 los.  Sent referral thru [employer].  Patient aware of his appt date and time.

## 2018-10-26 ENCOUNTER — Other Ambulatory Visit: Payer: Self-pay

## 2018-10-26 ENCOUNTER — Ambulatory Visit (HOSPITAL_COMMUNITY)
Admission: RE | Admit: 2018-10-26 | Discharge: 2018-10-26 | Disposition: A | Discharge status: Home / Self Care | Payer: 59 | Source: Ambulatory Visit | Attending: Hematology | Admitting: Hematology

## 2018-10-26 DIAGNOSIS — C9000 Multiple myeloma not having achieved remission: Secondary | ICD-10-CM

## 2018-10-26 DIAGNOSIS — D492 Neoplasm of unspecified behavior of bone, soft tissue, and skin: Secondary | ICD-10-CM | POA: Insufficient documentation

## 2018-10-26 MED ORDER — GADOBUTROL 1 MMOL/ML IV SOLN
5.0000 mL | Freq: Once | INTRAVENOUS | Status: AC | PRN
  Administered 2018-10-26: 5 mL via INTRAVENOUS

## 2018-10-27 ENCOUNTER — Other Ambulatory Visit: Payer: Self-pay | Admitting: Neurosurgery

## 2018-10-27 ENCOUNTER — Other Ambulatory Visit: Payer: Self-pay | Admitting: Radiation Therapy

## 2018-10-28 ENCOUNTER — Other Ambulatory Visit (HOSPITAL_COMMUNITY)
Admission: RE | Admit: 2018-10-28 | Discharge: 2018-10-28 | Disposition: A | Discharge status: Home / Self Care | Payer: 59 | Source: Ambulatory Visit | Attending: Neurosurgery | Admitting: Neurosurgery

## 2018-10-28 DIAGNOSIS — Z20828 Contact with and (suspected) exposure to other viral communicable diseases: Secondary | ICD-10-CM | POA: Insufficient documentation

## 2018-10-28 DIAGNOSIS — Z01812 Encounter for preprocedural laboratory examination: Secondary | ICD-10-CM | POA: Insufficient documentation

## 2018-10-29 LAB — NOVEL CORONAVIRUS, NAA (HOSP ORDER, SEND-OUT TO REF LAB; TAT 18-24 HRS): SARS-CoV-2, NAA: NOT DETECTED

## 2018-10-31 ENCOUNTER — Other Ambulatory Visit: Payer: Self-pay

## 2018-10-31 ENCOUNTER — Encounter (HOSPITAL_COMMUNITY): Payer: Self-pay | Admitting: *Deleted

## 2018-10-31 NOTE — Progress Notes (Signed)
Spoke with pt and his wife, Jesus [name] for pre-op call. Pt denies cardiac history, HTN or Diabetes. Pt does have Multiple Myeloma.   Pt had Covid test done on 9/25 and it is negative. Pt states he's been in quarantine since the test was done.  Pt and wife voiced understanding of the visitation policy.

## 2018-11-01 ENCOUNTER — Other Ambulatory Visit: Payer: Self-pay | Admitting: Neurosurgery

## 2018-11-01 ENCOUNTER — Telehealth: Payer: Self-pay | Admitting: *Deleted

## 2018-11-01 ENCOUNTER — Encounter (HOSPITAL_COMMUNITY)
Admission: RE | Disposition: A | Discharge status: Home / Self Care | Payer: Self-pay | Source: Home / Self Care | Attending: Neurosurgery

## 2018-11-01 ENCOUNTER — Other Ambulatory Visit: Payer: Self-pay

## 2018-11-01 ENCOUNTER — Ambulatory Visit (HOSPITAL_COMMUNITY)
Admission: RE | Admit: 2018-11-01 | Discharge: 2018-11-01 | Disposition: A | Discharge status: Home / Self Care | Payer: 59 | Source: Home / Self Care | Attending: Neurosurgery | Admitting: Neurosurgery

## 2018-11-01 DIAGNOSIS — Z7982 Long term (current) use of aspirin: Secondary | ICD-10-CM | POA: Insufficient documentation

## 2018-11-01 DIAGNOSIS — Z5309 Procedure and treatment not carried out because of other contraindication: Secondary | ICD-10-CM | POA: Insufficient documentation

## 2018-11-01 DIAGNOSIS — Z79891 Long term (current) use of opiate analgesic: Secondary | ICD-10-CM | POA: Insufficient documentation

## 2018-11-01 DIAGNOSIS — C9000 Multiple myeloma not having achieved remission: Secondary | ICD-10-CM | POA: Insufficient documentation

## 2018-11-01 DIAGNOSIS — Z79899 Other long term (current) drug therapy: Secondary | ICD-10-CM | POA: Insufficient documentation

## 2018-11-01 DIAGNOSIS — M4802 Spinal stenosis, cervical region: Secondary | ICD-10-CM | POA: Insufficient documentation

## 2018-11-01 HISTORY — DX: Unspecified osteoarthritis, unspecified site: M19.90

## 2018-11-01 SURGERY — ANTERIOR CERVICAL CORPECTOMY
Anesthesia: General

## 2018-11-01 MED ORDER — CHLORHEXIDINE GLUCONATE CLOTH 2 % EX PADS: 6.0000 | MEDICATED_PAD | Freq: Once | CUTANEOUS | Status: DC

## 2018-11-01 MED ORDER — VANCOMYCIN HCL IN DEXTROSE 1-5 GM/200ML-% IV SOLN: 1000.0000 mg | INTRAVENOUS | Status: DC

## 2018-11-01 NOTE — Pre-Procedure Instructions (Addendum)
Jesus [name]  11/01/2018       Your procedure will be rescheduled on Friday someone will call you and let you know what time to arrive.     Call this number if you have problems the morning of surgery:  [phone]   Remember:  Do not eat or drink after midnight.     Take these medicines the morning of surgery with A SIP OF WATER Tylenol (if needed), Acyclovir, Dronabinol, Morphine, Oxycodone (if needed), Pomalyst,     Do not wear jewelry, make-up or nail polish.  Do not wear lotions, powders, or perfumes, or deodorant.  Do not shave 48 hours prior to surgery.  Men may shave face and neck.  Do not bring valuables to the hospital.  [facility] is not responsible for any belongings or valuables.  Contacts, dentures or bridgework may not be worn into surgery.  Leave your suitcase in the car.  After surgery it may be brought to your room.  For patients admitted to the hospital, discharge time will be determined by your treatment team.  Patients discharged the day of surgery will not be allowed to drive home.   [facility] - Preparing for Surgery  Before surgery, you can play an important role.  Because skin is not sterile, your skin needs to be as free of germs as possible.  You can reduce the number of germs on you skin by washing with CHG (chlorahexidine gluconate) soap before surgery.  CHG is an antiseptic cleaner which kills germs and bonds with the skin to continue killing germs even after washing.  Oral Hygiene is also important in reducing the risk of infection.  Remember to brush your teeth with your regular toothpaste the morning of surgery.  Please DO NOT use if you have an allergy to CHG or antibacterial soaps.  If your skin becomes reddened/irritated stop using the CHG and inform your nurse when you arrive at Short Stay.  Do not shave (including legs and underarms) for at least 48 hours prior to the first CHG shower.  You may shave your face.  Please follow these  instructions carefully:   1.  Shower with CHG Soap the night before surgery and the morning of Surgery.  2.  If you choose to wash your hair, wash your hair first as usual with your normal shampoo.  3.  After you shampoo, rinse your hair and body thoroughly to remove the shampoo. 4.  Use CHG as you would any other liquid soap.  You can apply chg directly to the skin and wash gently with a      scrungie or washcloth.           5.  Apply the CHG Soap to your body ONLY FROM THE NECK DOWN.   Do not use on open wounds or open sores. Avoid contact with your eyes, ears, mouth and genitals (private parts).  Wash genitals (private parts) with your normal soap.  6.  Wash thoroughly, paying special attention to the area where your surgery will be performed.  7.  Thoroughly rinse your body with warm water from the neck down.  8.  DO NOT shower/wash with your normal soap after using and rinsing off the CHG Soap.  9.  Pat yourself dry with a clean towel.            10.  Wear clean pajamas.            11.  Place clean  sheets on your bed the night of your first shower and do not sleep with pets.  Day of Surgery  Do not apply any lotions/deoderants the morning of surgery.   Please wear clean clothes to the hospital/surgery center. Remember to brush your teeth with toothpaste.

## 2018-11-01 NOTE — H&P (Signed)
Patient ID:   [id number] Patient: Jesus [name]  Date of Birth: May 22, 1955 Visit Type: Office Visit   Date: 10/27/2018 10:00 AM Provider: Marchia Meiers. Vertell Limber MD   This 63 year old male presents for neck pain.  HISTORY OF PRESENT ILLNESS:  1.  neck pain  Jesus [name], 63 year old male employed with [employer], visits for evaluation of neck pain.  Patient notes onset of neck pain in August following radiation treatments for lesions both shoulders, both hips, T12 vertebra in July.  Follow-up imaging revealed cervical lesion.  Oxycodone 5 mg p.r.n. Morphine 15 mg p.r.n.  History:  Multiple myeloma diagnosis July 2018, otherwise historically healthy Surgical history:  T10 corpectomy July 2018, port insertion  PET 10/13/2018 MRI 10/26/2018  The patient is having severe neck pain.  An MRI obtained yesterday shows a destructive mass at this C3 level with retropulsion of bone in the spinal canal with cord compression.  The patient is denying upper extremity numbness or leg numbness or weakness.  Did well with his T10 corpectomy in many apparatus which was performed in July of 2018.  He underwent radiation therapy with Dr. Darcus Austin the in July of this year for recurrence at the T12 level.          PAST MEDICAL/SURGICAL HISTORY:   (Detailed)    Disease/disorder Onset Date Management Date Comments    Surgery, lumbar spine  CRR 10/27/2018 -     PAST MEDICAL HISTORY, SURGICAL HISTORY, FAMILY HISTORY, SOCIAL HISTORY AND REVIEW OF SYSTEMS I have reviewed the patient's past medical, surgical, family and social history as well as the comprehensive review of systems as included on the [facility] history form dated 10/27/2018, which I have signed.  Family History:  (Detailed) Patient reports there is no relevant family history.     Social History:  (Detailed) Tobacco use reviewed. Preferred language is [place].   Tobacco use status: Current  non-smoker. Smoking status: Never smoker.  SMOKING STATUS Type Smoking Status Usage Per Day Years Used Total Pack Years   Never smoker      VAPING USE Screened for vaping? No  TOBACCO/VAPING EXPOSURE No passive vaping exposure. No passive smoke exposure.       MEDICATIONS: (added, continued or stopped this visit) Started Medication Directions Instruction Stopped   acyclovir 400 mg tablet take 1 tablet by oral route 2 times every day for 10 days     aspirin 81 mg tablet,delayed release take 1 tablet by oral route  every day     calcium carbonate-vitamin D3 1,000 mg (2,500 mg)-800 unit tablet take 1 tablet by oral route  every day     Marinol 10 mg capsule take (5MG/M2)  by oral route 1 to 3 hours before chemotherapy, then every 2 to 4 hours after chemotherapy as needed, totaling 4-6 doses daily     MS Contin 15 mg tablet,extended release take 1 tablet by oral route  every 12 hours     oxycodone 5 mg tablet take 1 tablet by oral route  every 4 - 6 hours as needed     Pomalyst 2 mg capsule take 2 capsule by oral route  every day on days 1 through 21 of a 28 day treatment cycle     Senna Plus 8.6 mg-50 mg capsule take 2 capsule by oral route  every day     sucralfate 1 gram tablet take 1 tablet by oral route 4 times every day on an empty stomach 1 hour  before meals and at bedtime     tramadol 50 mg tablet take 1 tablet by oral route  every 6 hours as needed     Vitamin D3 25 mcg (1,000 unit) capsule take 1 tablet by oral route  every day     Zofran 4 mg tablet take 2 tablet by oral route  every 8 hours for 2 days       ALLERGIES: Ingredient Reaction Medication Name Comment  PENICILLINS      Reviewed, updated.   REVIEW OF SYSTEMS   See scanned patient registration form, dated 10/27/2018, signed and dated on 10/27/2018  Review of Systems Details System Neg/Pos Details  Constitutional Negative Chills, Fatigue, Fever, Malaise, Night sweats, Weight gain and Weight loss.  ENMT  Negative Ear drainage, Hearing loss, Nasal drainage, Otalgia, Sinus pressure and Sore throat.  Eyes Negative Eye discharge, Eye pain and Vision changes.  Respiratory Negative Chronic cough, Cough, Dyspnea, Known TB exposure and Wheezing.  Cardio Negative Chest pain, Claudication, Edema and Irregular heartbeat/palpitations.  GI Negative Abdominal pain, Blood in stool, Change in stool pattern, Constipation, Decreased appetite, Diarrhea, Heartburn, Nausea and Vomiting.  GU Negative Dribbling, Dysuria, Erectile dysfunction, Hematuria, Polyuria (Genitourinary), Slow stream, Urinary frequency, Urinary incontinence and Urinary retention.  Endocrine Negative Cold intolerance, Heat intolerance, Polydipsia and Polyphagia.  Neuro Negative Dizziness, Extremity weakness, Gait disturbance, Headache, Memory impairment, Numbness in extremity, Seizures and Tremors.  Psych Negative Anxiety, Depression and Insomnia.  Integumentary Negative Brittle hair, Brittle nails, Change in shape/size of mole(s), Hair loss, Hirsutism, Hives, Pruritus, Rash and Skin lesion.  MS Positive Neck pain.  Hema/Lymph Negative Easy bleeding, Easy bruising and Lymphadenopathy.  Allergic/Immuno Negative Contact allergy, Environmental allergies, Food allergies and Seasonal allergies.  Reproductive Negative Penile discharge and Sexual dysfunction.   PHYSICAL EXAM:   Vitals Date Temp F BP Pulse Ht In Wt Lb BMI BSA Pain Score  10/27/2018 96.9 132/90 91 68 123.8 18.82  4/10    PHYSICAL EXAM Details General Level of Distress: no acute distress Overall Appearance: normal  Head and Face  Right Left  Fundoscopic Exam:  normal normal    Cardiovascular Cardiac: regular rate and rhythm without murmur  Right Left  Carotid Pulses: normal normal  Respiratory Lungs: clear to auscultation  Neurological Orientation: normal Recent and Remote Memory: normal Attention Span and Concentration:   normal Language: normal Fund of  Knowledge: normal  Right Left Sensation: normal normal Upper Extremity Coordination: normal normal  Lower Extremity Coordination: normal normal  Musculoskeletal Gait and Station: normal  Right Left Upper Extremity Muscle Strength: normal normal Lower Extremity Muscle Strength: normal normal Upper Extremity Muscle Tone:  normal normal Lower Extremity Muscle Tone: normal normal   Motor Strength Upper and lower extremity motor strength was tested in the clinically pertinent muscles.     Deep Tendon Reflexes  Right Left Biceps: normal normal Triceps: normal normal Brachioradialis: normal normal Patellar: normal normal Achilles: normal normal  Sensory Sensation was tested at C2 to T1. Any abnormal findings will be noted below.  Right Left C4: decreased decreased   Cranial Nerves II. Optic Nerve/Visual Fields: normal III. Oculomotor: normal IV. Trochlear: normal V. Trigeminal: normal VI. Abducens: normal VII. Facial: normal VIII. Acoustic/Vestibular: normal IX. Glossopharyngeal: normal X. Vagus: normal XI. Spinal Accessory: normal XII. Hypoglossal: normal  Motor and other Tests Lhermittes: negative Rhomberg: negative Pronator drift: absent     Right Left Spurlings negative negative Hoffman's: normal normal Clonus: normal normal Babinski: normal normal SLR: negative negative Patrick's (  Faber): negative negative Toe Walk: normal normal Toe Lift: normal normal Heel Walk: normal normal Tinels Elbow: negative negative Tinels Wrist: negative negative Phalen: negative negative   Additional Findings:  Patient has significant neck pain.  He has increased pain with extension his neck.    IMPRESSION:   The patient has a destructive mass lesion at the C3 level with vertebral fracture and retropulsion of bone and tumor into the spinal canal.  I have reviewed the patient's imaging with him and his wife and also discussed the situation with doctors [place] and  [place].  All are in agreement with proceeding with C3 corpectomy and cervical reconstructive surgery with postoperative radiation therapy and with delaying chemotherapy in the immediate postop period  PLAN:  C3 corpectomy with Vista cervical collar.  Orders: Diagnostic Procedures: Assessment Procedure  M48.02 Cervical Spine- AP/Lat  M48.02 Cervical Spine- Lateral  Instruction(s)/Education: Assessment Instruction  R03.0 Hypertension education   Completed Orders (this encounter) Order Details Reason Side Interpretation Result Initial Treatment Date Region  Hypertension education Continue to monitor blood pressure and if it remains elevated contact primary physician        Cervical Spine- AP/Lat      10/27/2018 C3 to C3   Assessment/Plan   # Detail Type Description   1. Assessment Multiple myeloma not having achieved remission (C90.00).       2. Assessment Other closed displaced fracture of third cervical vertebra, initial encounter (S12.290A).       3. Assessment Radiculopathy, cervical region (M54.12).       4. Assessment Cervicalgia (M54.2).       5. Assessment Cervical stenosis of spinal canal (M48.02).       6. Assessment Elevated blood-pressure reading, w/o diagnosis of htn (R03.0).         Pain Management Plan Pain Scale: 4/10. Method: Numeric Pain Intensity Scale. Location: neck. Onset: 09/03/2018. Duration: varies. Quality: discomforting. Pain management follow-up plan of care: Patient is to use pain medication as directed.              Provider:  Marchia Meiers. Vertell Limber MD  10/27/2018 04:23 PM    Dictation edited by: Marchia Meiers. Vertell Limber    CC Providers: [place], [place] [id number]-               Electronically signed by Marchia Meiers. Vertell Limber MD on 10/27/2018 04:24 PM

## 2018-11-01 NOTE — Telephone Encounter (Signed)
Patient scheduled for surgery on 10/2. Appts at [facility] cancelled for 10/2. Will f/u with Dr.Kale on 10/9 as scheduled. Patient contacted with this information and verbalized understanding.

## 2018-11-01 NOTE — Progress Notes (Addendum)
Patient has a rare phenotype that needs additional testing by blood bank. Patient's surgery will have to be rescheduled until Friday. Written preop instructions provided. Per blood bank will still need additional type and screen drawn Friday. Patient verbalized understanding of instructions. Patient had 18 gauge in LFA that was removed intact.

## 2018-11-01 NOTE — H&P (View-Only) (Signed)
Patient ID:   [id number] Patient: Jesus [name]  Date of Birth: 1955/12/09 Visit Type: Office Visit   Date: 10/27/2018 10:00 AM Provider: Marchia Meiers. Vertell Limber MD   This 63 year old male presents for neck pain.  HISTORY OF PRESENT ILLNESS:  1.  neck pain  [name], 63 year old male employed with [employer], visits for evaluation of neck pain.  Patient notes onset of neck pain in August following radiation treatments for lesions both shoulders, both hips, T12 vertebra in July.  Follow-up imaging revealed cervical lesion.  Oxycodone 5 mg p.r.n. Morphine 15 mg p.r.n.  History:  Multiple myeloma diagnosis July 2018, otherwise historically healthy Surgical history:  T10 corpectomy July 2018, port insertion  PET 10/13/2018 MRI 10/26/2018  The patient is having severe neck pain.  An MRI obtained yesterday shows a destructive mass at this C3 level with retropulsion of bone in the spinal canal with cord compression.  The patient is denying upper extremity numbness or leg numbness or weakness.  Did well with his T10 corpectomy in many apparatus which was performed in July of 2018.  He underwent radiation therapy with Dr. Darcus Austin the in July of this year for recurrence at the T12 level.          PAST MEDICAL/SURGICAL HISTORY:   (Detailed)    Disease/disorder Onset Date Management Date Comments    Surgery, lumbar spine  CRR 10/27/2018 -     PAST MEDICAL HISTORY, SURGICAL HISTORY, FAMILY HISTORY, SOCIAL HISTORY AND REVIEW OF SYSTEMS I have reviewed the patient's past medical, surgical, family and social history as well as the comprehensive review of systems as included on the [facility] history form dated 10/27/2018, which I have signed.  Family History:  (Detailed) Patient reports there is no relevant family history.     Social History:  (Detailed) Tobacco use reviewed. Preferred language is [place].   Tobacco use status: Current  non-smoker. Smoking status: Never smoker.  SMOKING STATUS Type Smoking Status Usage Per Day Years Used Total Pack Years   Never smoker      VAPING USE Screened for vaping? No  TOBACCO/VAPING EXPOSURE No passive vaping exposure. No passive smoke exposure.       MEDICATIONS: (added, continued or stopped this visit) Started Medication Directions Instruction Stopped   acyclovir 400 mg tablet take 1 tablet by oral route 2 times every day for 10 days     aspirin 81 mg tablet,delayed release take 1 tablet by oral route  every day     calcium carbonate-vitamin D3 1,000 mg (2,500 mg)-800 unit tablet take 1 tablet by oral route  every day     Marinol 10 mg capsule take (5MG/M2)  by oral route 1 to 3 hours before chemotherapy, then every 2 to 4 hours after chemotherapy as needed, totaling 4-6 doses daily     MS Contin 15 mg tablet,extended release take 1 tablet by oral route  every 12 hours     oxycodone 5 mg tablet take 1 tablet by oral route  every 4 - 6 hours as needed     Pomalyst 2 mg capsule take 2 capsule by oral route  every day on days 1 through 21 of a 28 day treatment cycle     Senna Plus 8.6 mg-50 mg capsule take 2 capsule by oral route  every day     sucralfate 1 gram tablet take 1 tablet by oral route 4 times every day on an empty stomach 1 hour  before meals and at bedtime     tramadol 50 mg tablet take 1 tablet by oral route  every 6 hours as needed     Vitamin D3 25 mcg (1,000 unit) capsule take 1 tablet by oral route  every day     Zofran 4 mg tablet take 2 tablet by oral route  every 8 hours for 2 days       ALLERGIES: Ingredient Reaction Medication Name Comment  PENICILLINS      Reviewed, updated.   REVIEW OF SYSTEMS   See scanned patient registration form, dated 10/27/2018, signed and dated on 10/27/2018  Review of Systems Details System Neg/Pos Details  Constitutional Negative Chills, Fatigue, Fever, Malaise, Night sweats, Weight gain and Weight loss.  ENMT  Negative Ear drainage, Hearing loss, Nasal drainage, Otalgia, Sinus pressure and Sore throat.  Eyes Negative Eye discharge, Eye pain and Vision changes.  Respiratory Negative Chronic cough, Cough, Dyspnea, Known TB exposure and Wheezing.  Cardio Negative Chest pain, Claudication, Edema and Irregular heartbeat/palpitations.  GI Negative Abdominal pain, Blood in stool, Change in stool pattern, Constipation, Decreased appetite, Diarrhea, Heartburn, Nausea and Vomiting.  GU Negative Dribbling, Dysuria, Erectile dysfunction, Hematuria, Polyuria (Genitourinary), Slow stream, Urinary frequency, Urinary incontinence and Urinary retention.  Endocrine Negative Cold intolerance, Heat intolerance, Polydipsia and Polyphagia.  Neuro Negative Dizziness, Extremity weakness, Gait disturbance, Headache, Memory impairment, Numbness in extremity, Seizures and Tremors.  Psych Negative Anxiety, Depression and Insomnia.  Integumentary Negative Brittle hair, Brittle nails, Change in shape/size of mole(s), Hair loss, Hirsutism, Hives, Pruritus, Rash and Skin lesion.  MS Positive Neck pain.  Hema/Lymph Negative Easy bleeding, Easy bruising and Lymphadenopathy.  Allergic/Immuno Negative Contact allergy, Environmental allergies, Food allergies and Seasonal allergies.  Reproductive Negative Penile discharge and Sexual dysfunction.   PHYSICAL EXAM:   Vitals Date Temp F BP Pulse Ht In Wt Lb BMI BSA Pain Score  10/27/2018 96.9 132/90 91 68 123.8 18.82  4/10    PHYSICAL EXAM Details General Level of Distress: no acute distress Overall Appearance: normal  Head and Face  Right Left  Fundoscopic Exam:  normal normal    Cardiovascular Cardiac: regular rate and rhythm without murmur  Right Left  Carotid Pulses: normal normal  Respiratory Lungs: clear to auscultation  Neurological Orientation: normal Recent and Remote Memory: normal Attention Span and Concentration:   normal Language: normal Fund of  Knowledge: normal  Right Left Sensation: normal normal Upper Extremity Coordination: normal normal  Lower Extremity Coordination: normal normal  Musculoskeletal Gait and Station: normal  Right Left Upper Extremity Muscle Strength: normal normal Lower Extremity Muscle Strength: normal normal Upper Extremity Muscle Tone:  normal normal Lower Extremity Muscle Tone: normal normal   Motor Strength Upper and lower extremity motor strength was tested in the clinically pertinent muscles.     Deep Tendon Reflexes  Right Left Biceps: normal normal Triceps: normal normal Brachioradialis: normal normal Patellar: normal normal Achilles: normal normal  Sensory Sensation was tested at C2 to T1. Any abnormal findings will be noted below.  Right Left C4: decreased decreased   Cranial Nerves II. Optic Nerve/Visual Fields: normal III. Oculomotor: normal IV. Trochlear: normal V. Trigeminal: normal VI. Abducens: normal VII. Facial: normal VIII. Acoustic/Vestibular: normal IX. Glossopharyngeal: normal X. Vagus: normal XI. Spinal Accessory: normal XII. Hypoglossal: normal  Motor and other Tests Lhermittes: negative Rhomberg: negative Pronator drift: absent     Right Left Spurlings negative negative Hoffman's: normal normal Clonus: normal normal Babinski: normal normal SLR: negative negative Patrick's (  Faber): negative negative Toe Walk: normal normal Toe Lift: normal normal Heel Walk: normal normal Tinels Elbow: negative negative Tinels Wrist: negative negative Phalen: negative negative   Additional Findings:  Patient has significant neck pain.  He has increased pain with extension his neck.    IMPRESSION:   The patient has a destructive mass lesion at the C3 level with vertebral fracture and retropulsion of bone and tumor into the spinal canal.  I have reviewed the patient's imaging with him and his wife and also discussed the situation with doctors [place] and  [place].  All are in agreement with proceeding with C3 corpectomy and cervical reconstructive surgery with postoperative radiation therapy and with delaying chemotherapy in the immediate postop period  PLAN:  C3 corpectomy with Vista cervical collar.  Orders: Diagnostic Procedures: Assessment Procedure  M48.02 Cervical Spine- AP/Lat  M48.02 Cervical Spine- Lateral  Instruction(s)/Education: Assessment Instruction  R03.0 Hypertension education   Completed Orders (this encounter) Order Details Reason Side Interpretation Result Initial Treatment Date Region  Hypertension education Continue to monitor blood pressure and if it remains elevated contact primary physician        Cervical Spine- AP/Lat      10/27/2018 C3 to C3   Assessment/Plan   # Detail Type Description   1. Assessment Multiple myeloma not having achieved remission (C90.00).       2. Assessment Other closed displaced fracture of third cervical vertebra, initial encounter (S12.290A).       3. Assessment Radiculopathy, cervical region (M54.12).       4. Assessment Cervicalgia (M54.2).       5. Assessment Cervical stenosis of spinal canal (M48.02).       6. Assessment Elevated blood-pressure reading, w/o diagnosis of htn (R03.0).         Pain Management Plan Pain Scale: 4/10. Method: Numeric Pain Intensity Scale. Location: neck. Onset: 09/03/2018. Duration: varies. Quality: discomforting. Pain management follow-up plan of care: Patient is to use pain medication as directed.              Provider:  Marchia Meiers. Vertell Limber MD  10/27/2018 04:23 PM    Dictation edited by: Marchia Meiers. Vertell Limber    CC Providers: [place], [place] [id number]-               Electronically signed by Marchia Meiers. Vertell Limber MD on 10/27/2018 04:24 PM

## 2018-11-02 ENCOUNTER — Other Ambulatory Visit (HOSPITAL_COMMUNITY)
Admission: RE | Admit: 2018-11-02 | Discharge: 2018-11-02 | Disposition: A | Discharge status: Home / Self Care | Payer: 59 | Source: Ambulatory Visit | Attending: Neurosurgery | Admitting: Neurosurgery

## 2018-11-02 ENCOUNTER — Telehealth: Payer: Self-pay | Admitting: Radiation Oncology

## 2018-11-02 LAB — SARS CORONAVIRUS 2 (TAT 6-24 HRS): SARS Coronavirus 2: NEGATIVE

## 2018-11-02 NOTE — Telephone Encounter (Signed)
I called the patient to let him know the recommendations for conventional radiotherapy following his corpectomy with Dr. Vertell Limber this Friday. Dr. Lisbeth Renshaw would like him to be seen in about 3 weeks to consider postoperative radiotherapy to reduce the chance of recurrence.

## 2018-11-03 ENCOUNTER — Encounter (HOSPITAL_COMMUNITY): Payer: Self-pay | Admitting: *Deleted

## 2018-11-03 ENCOUNTER — Telehealth: Payer: Self-pay | Admitting: *Deleted

## 2018-11-03 ENCOUNTER — Other Ambulatory Visit: Payer: Self-pay

## 2018-11-03 NOTE — Progress Notes (Signed)
Pt made aware to stop taking  Aspirin (unless otherwise advised by surgeon), vitamins, fish oil and herbal medications. Do not take any NSAIDs ie: Ibuprofen, Advil, Naproxen (Aleve), Motrin, BC and Goody Powder. Pt verbalized understanding of all pre-op instructions.

## 2018-11-03 NOTE — Telephone Encounter (Signed)
Per Dr.Kale:Contacted patient -  Inform patient to hold Pomalyst starting now - will assess for restart at next visit (post surgery 11/04/2018). Patient verbalized understanding.

## 2018-11-04 ENCOUNTER — Other Ambulatory Visit: Payer: 59

## 2018-11-04 ENCOUNTER — Inpatient Hospital Stay (HOSPITAL_COMMUNITY): Payer: 59 | Admitting: Anesthesiology

## 2018-11-04 ENCOUNTER — Inpatient Hospital Stay (HOSPITAL_COMMUNITY)
Admission: RE | Admit: 2018-11-04 | Discharge: 2018-11-07 | DRG: 821 | Disposition: A | Discharge status: Home / Self Care | Payer: 59 | Attending: Neurosurgery | Admitting: Neurosurgery

## 2018-11-04 ENCOUNTER — Encounter (HOSPITAL_COMMUNITY): Payer: Self-pay

## 2018-11-04 ENCOUNTER — Inpatient Hospital Stay (HOSPITAL_COMMUNITY): Payer: 59

## 2018-11-04 ENCOUNTER — Other Ambulatory Visit: Payer: Self-pay

## 2018-11-04 ENCOUNTER — Ambulatory Visit: Payer: 59

## 2018-11-04 ENCOUNTER — Inpatient Hospital Stay (HOSPITAL_COMMUNITY)
Admission: RE | Disposition: A | Discharge status: Home / Self Care | Payer: Self-pay | Source: Home / Self Care | Attending: Neurosurgery

## 2018-11-04 DIAGNOSIS — Z79899 Other long term (current) drug therapy: Secondary | ICD-10-CM | POA: Diagnosis not present

## 2018-11-04 DIAGNOSIS — Z880 Allergy status to penicillin: Secondary | ICD-10-CM

## 2018-11-04 DIAGNOSIS — R030 Elevated blood-pressure reading, without diagnosis of hypertension: Secondary | ICD-10-CM | POA: Diagnosis present

## 2018-11-04 DIAGNOSIS — Z7982 Long term (current) use of aspirin: Secondary | ICD-10-CM

## 2018-11-04 DIAGNOSIS — Z923 Personal history of irradiation: Secondary | ICD-10-CM

## 2018-11-04 DIAGNOSIS — Z79891 Long term (current) use of opiate analgesic: Secondary | ICD-10-CM

## 2018-11-04 DIAGNOSIS — M4852XA Collapsed vertebra, not elsewhere classified, cervical region, initial encounter for fracture: Secondary | ICD-10-CM | POA: Diagnosis present

## 2018-11-04 DIAGNOSIS — M5011 Cervical disc disorder with radiculopathy,  high cervical region: Secondary | ICD-10-CM | POA: Diagnosis present

## 2018-11-04 DIAGNOSIS — M4802 Spinal stenosis, cervical region: Secondary | ICD-10-CM | POA: Diagnosis present

## 2018-11-04 DIAGNOSIS — Z419 Encounter for procedure for purposes other than remedying health state, unspecified: Secondary | ICD-10-CM

## 2018-11-04 DIAGNOSIS — C9000 Multiple myeloma not having achieved remission: Secondary | ICD-10-CM | POA: Diagnosis present

## 2018-11-04 DIAGNOSIS — Z20828 Contact with and (suspected) exposure to other viral communicable diseases: Secondary | ICD-10-CM | POA: Diagnosis present

## 2018-11-04 HISTORY — DX: Spinal stenosis, cervical region: M48.02

## 2018-11-04 HISTORY — PX: ANTERIOR CERVICAL CORPECTOMY: SHX1159

## 2018-11-04 HISTORY — DX: Presence of spectacles and contact lenses: Z97.3

## 2018-11-04 LAB — TYPE AND SCREEN
ABO/RH(D): A POS
ABO/RH(D): A POS
Antibody Screen: POSITIVE

## 2018-11-04 LAB — GLUCOSE, CAPILLARY: Glucose-Capillary: 131 mg/dL — ABNORMAL HIGH (ref 70–99)

## 2018-11-04 SURGERY — ANTERIOR CERVICAL CORPECTOMY
Anesthesia: General | Site: Neck

## 2018-11-04 MED ORDER — MENTHOL 3 MG MT LOZG: 1.0000 | LOZENGE | OROMUCOSAL | Status: DC | PRN

## 2018-11-04 MED ORDER — LIDOCAINE 2% (20 MG/ML) 5 ML SYRINGE
INTRAMUSCULAR | Status: DC | PRN
  Administered 2018-11-04: 100 mg via INTRAVENOUS

## 2018-11-04 MED ORDER — PHENYLEPHRINE 40 MCG/ML (10ML) SYRINGE FOR IV PUSH (FOR BLOOD PRESSURE SUPPORT)
PREFILLED_SYRINGE | INTRAVENOUS | Status: AC
  Filled 2018-11-04: qty 10

## 2018-11-04 MED ORDER — MIDAZOLAM HCL 5 MG/5ML IJ SOLN
INTRAMUSCULAR | Status: DC | PRN
  Administered 2018-11-04: 2 mg via INTRAVENOUS

## 2018-11-04 MED ORDER — ONDANSETRON HCL 4 MG/2ML IJ SOLN
INTRAMUSCULAR | Status: DC | PRN
  Administered 2018-11-04: 4 mg via INTRAVENOUS

## 2018-11-04 MED ORDER — ROCURONIUM BROMIDE 10 MG/ML (PF) SYRINGE
PREFILLED_SYRINGE | INTRAVENOUS | Status: AC
  Filled 2018-11-04: qty 10

## 2018-11-04 MED ORDER — BUPIVACAINE HCL (PF) 0.5 % IJ SOLN
INTRAMUSCULAR | Status: AC
  Filled 2018-11-04: qty 30

## 2018-11-04 MED ORDER — METHOCARBAMOL 500 MG PO TABS
500.0000 mg | ORAL_TABLET | Freq: Four times a day (QID) | ORAL | Status: DC | PRN
  Administered 2018-11-04 – 2018-11-07 (×3): 500 mg via ORAL
  Filled 2018-11-04 (×2): qty 1

## 2018-11-04 MED ORDER — OXYCODONE HCL 5 MG PO TABS
5.0000 mg | ORAL_TABLET | ORAL | Status: DC | PRN
  Administered 2018-11-06 – 2018-11-07 (×2): 5 mg via ORAL
  Filled 2018-11-04: qty 1

## 2018-11-04 MED ORDER — FENTANYL CITRATE (PF) 100 MCG/2ML IJ SOLN
INTRAMUSCULAR | Status: AC
  Filled 2018-11-04: qty 2

## 2018-11-04 MED ORDER — ALBUMIN HUMAN 5 % IV SOLN
INTRAVENOUS | Status: DC | PRN
  Administered 2018-11-04: 15:00:00 via INTRAVENOUS

## 2018-11-04 MED ORDER — LIDOCAINE 2% (20 MG/ML) 5 ML SYRINGE
INTRAMUSCULAR | Status: AC
  Filled 2018-11-04: qty 5

## 2018-11-04 MED ORDER — CHLORHEXIDINE GLUCONATE CLOTH 2 % EX PADS: 6.0000 | MEDICATED_PAD | Freq: Once | CUTANEOUS | Status: DC

## 2018-11-04 MED ORDER — DEXAMETHASONE 4 MG PO TABS
4.0000 mg | ORAL_TABLET | Freq: Four times a day (QID) | ORAL | Status: DC
  Administered 2018-11-05 (×2): 4 mg via ORAL
  Filled 2018-11-04 (×2): qty 1

## 2018-11-04 MED ORDER — ONDANSETRON HCL 4 MG/2ML IJ SOLN
INTRAMUSCULAR | Status: AC
  Filled 2018-11-04: qty 2

## 2018-11-04 MED ORDER — PROPOFOL 10 MG/ML IV BOLUS
INTRAVENOUS | Status: AC
  Filled 2018-11-04: qty 20

## 2018-11-04 MED ORDER — ACETAMINOPHEN 325 MG PO TABS: 325.0000 mg | ORAL_TABLET | Freq: Four times a day (QID) | ORAL | Status: DC | PRN

## 2018-11-04 MED ORDER — OXYCODONE HCL 5 MG PO TABS
ORAL_TABLET | ORAL | Status: AC
  Filled 2018-11-04: qty 1

## 2018-11-04 MED ORDER — METHOCARBAMOL 500 MG PO TABS
ORAL_TABLET | ORAL | Status: AC
  Filled 2018-11-04: qty 1

## 2018-11-04 MED ORDER — SODIUM CHLORIDE 0.9% FLUSH: 3.0000 mL | INTRAVENOUS | Status: DC | PRN

## 2018-11-04 MED ORDER — FLEET ENEMA 7-19 GM/118ML RE ENEM: 1.0000 | ENEMA | Freq: Once | RECTAL | Status: DC | PRN

## 2018-11-04 MED ORDER — LIDOCAINE-EPINEPHRINE 1 %-1:100000 IJ SOLN
INTRAMUSCULAR | Status: AC
  Filled 2018-11-04: qty 1

## 2018-11-04 MED ORDER — FENTANYL CITRATE (PF) 100 MCG/2ML IJ SOLN
INTRAMUSCULAR | Status: DC | PRN
  Administered 2018-11-04 (×2): 50 ug via INTRAVENOUS

## 2018-11-04 MED ORDER — LIDOCAINE-PRILOCAINE 2.5-2.5 % EX CREA
1.0000 "application " | TOPICAL_CREAM | CUTANEOUS | Status: DC | PRN
  Filled 2018-11-04: qty 5

## 2018-11-04 MED ORDER — OXYCODONE HCL 5 MG PO TABS
5.0000 mg | ORAL_TABLET | ORAL | Status: DC | PRN
  Administered 2018-11-04: 20:00:00 5 mg via ORAL
  Filled 2018-11-04: qty 1

## 2018-11-04 MED ORDER — PROPOFOL 10 MG/ML IV BOLUS
INTRAVENOUS | Status: DC | PRN
  Administered 2018-11-04: 150 mg via INTRAVENOUS

## 2018-11-04 MED ORDER — VANCOMYCIN HCL IN DEXTROSE 750-5 MG/150ML-% IV SOLN
750.0000 mg | Freq: Two times a day (BID) | INTRAVENOUS | Status: DC
  Administered 2018-11-05 – 2018-11-06 (×3): 750 mg via INTRAVENOUS
  Filled 2018-11-04 (×4): qty 150

## 2018-11-04 MED ORDER — PHENOL 1.4 % MT LIQD: 1.0000 | OROMUCOSAL | Status: DC | PRN

## 2018-11-04 MED ORDER — DEXAMETHASONE SODIUM PHOSPHATE 10 MG/ML IJ SOLN
INTRAMUSCULAR | Status: AC
  Filled 2018-11-04: qty 1

## 2018-11-04 MED ORDER — VITAMIN D (ERGOCALCIFEROL) 1.25 MG (50000 UNIT) PO CAPS
50000.0000 [IU] | ORAL_CAPSULE | ORAL | Status: DC
  Administered 2018-11-07: 09:00:00 50000 [IU] via ORAL
  Filled 2018-11-04: qty 1

## 2018-11-04 MED ORDER — ACETAMINOPHEN 500 MG PO TABS
ORAL_TABLET | ORAL | Status: AC
  Administered 2018-11-04: 12:00:00 1000 mg via ORAL
  Filled 2018-11-04: qty 2

## 2018-11-04 MED ORDER — BISACODYL 10 MG RE SUPP: 10.0000 mg | Freq: Every day | RECTAL | Status: DC | PRN

## 2018-11-04 MED ORDER — SENNOSIDES-DOCUSATE SODIUM 8.6-50 MG PO TABS
1.0000 | ORAL_TABLET | Freq: Every day | ORAL | Status: DC
  Administered 2018-11-04 – 2018-11-07 (×4): 1 via ORAL
  Filled 2018-11-04 (×5): qty 1

## 2018-11-04 MED ORDER — VANCOMYCIN HCL IN DEXTROSE 1-5 GM/200ML-% IV SOLN
1000.0000 mg | INTRAVENOUS | Status: AC
  Administered 2018-11-04: 12:00:00 1000 mg via INTRAVENOUS

## 2018-11-04 MED ORDER — ACETAMINOPHEN 650 MG RE SUPP: 650.0000 mg | RECTAL | Status: DC | PRN

## 2018-11-04 MED ORDER — SUGAMMADEX SODIUM 200 MG/2ML IV SOLN
INTRAVENOUS | Status: DC | PRN
  Administered 2018-11-04: 200 mg via INTRAVENOUS

## 2018-11-04 MED ORDER — SUCCINYLCHOLINE CHLORIDE 200 MG/10ML IV SOSY
PREFILLED_SYRINGE | INTRAVENOUS | Status: AC
  Filled 2018-11-04: qty 10

## 2018-11-04 MED ORDER — DOCUSATE SODIUM 100 MG PO CAPS
100.0000 mg | ORAL_CAPSULE | Freq: Two times a day (BID) | ORAL | Status: DC
  Administered 2018-11-04 – 2018-11-07 (×6): 100 mg via ORAL
  Filled 2018-11-04 (×6): qty 1

## 2018-11-04 MED ORDER — DEXAMETHASONE 4 MG PO TABS: 20.0000 mg | ORAL_TABLET | ORAL | Status: DC

## 2018-11-04 MED ORDER — ONDANSETRON HCL 4 MG PO TABS: 4.0000 mg | ORAL_TABLET | Freq: Three times a day (TID) | ORAL | Status: DC | PRN

## 2018-11-04 MED ORDER — 0.9 % SODIUM CHLORIDE (POUR BTL) OPTIME
TOPICAL | Status: DC | PRN
  Administered 2018-11-04: 1000 mL

## 2018-11-04 MED ORDER — THROMBIN 5000 UNITS EX SOLR
CUTANEOUS | Status: AC
  Filled 2018-11-04: qty 10000

## 2018-11-04 MED ORDER — ASPIRIN EC 81 MG PO TBEC
81.0000 mg | DELAYED_RELEASE_TABLET | Freq: Every day | ORAL | Status: DC
  Administered 2018-11-04 – 2018-11-07 (×4): 81 mg via ORAL
  Filled 2018-11-04 (×4): qty 1

## 2018-11-04 MED ORDER — MORPHINE SULFATE (PF) 2 MG/ML IV SOLN: 2.0000 mg | INTRAVENOUS | Status: DC | PRN

## 2018-11-04 MED ORDER — THROMBIN 5000 UNITS EX SOLR
CUTANEOUS | Status: AC
  Filled 2018-11-04: qty 5000

## 2018-11-04 MED ORDER — ZOLPIDEM TARTRATE 5 MG PO TABS: 5.0000 mg | ORAL_TABLET | Freq: Every evening | ORAL | Status: DC | PRN

## 2018-11-04 MED ORDER — PROCHLORPERAZINE MALEATE 10 MG PO TABS
10.0000 mg | ORAL_TABLET | Freq: Four times a day (QID) | ORAL | Status: DC | PRN
  Filled 2018-11-04: qty 1

## 2018-11-04 MED ORDER — LACTATED RINGERS IV SOLN
INTRAVENOUS | Status: DC
  Administered 2018-11-04 (×3): via INTRAVENOUS

## 2018-11-04 MED ORDER — SUCRALFATE 1 G PO TABS
1.0000 g | ORAL_TABLET | Freq: Four times a day (QID) | ORAL | Status: DC
  Administered 2018-11-04 – 2018-11-07 (×10): 1 g via ORAL
  Filled 2018-11-04 (×9): qty 1

## 2018-11-04 MED ORDER — CALCIUM CARBONATE-VITAMIN D 500-200 MG-UNIT PO TABS
1.0000 | ORAL_TABLET | Freq: Every day | ORAL | Status: DC
  Administered 2018-11-05 – 2018-11-07 (×3): 1 via ORAL
  Filled 2018-11-04 (×3): qty 1

## 2018-11-04 MED ORDER — DEXAMETHASONE SODIUM PHOSPHATE 10 MG/ML IJ SOLN
INTRAMUSCULAR | Status: DC | PRN
  Administered 2018-11-04: 10 mg via INTRAVENOUS

## 2018-11-04 MED ORDER — ONDANSETRON HCL 4 MG PO TABS: 8.0000 mg | ORAL_TABLET | Freq: Two times a day (BID) | ORAL | Status: DC | PRN

## 2018-11-04 MED ORDER — KCL IN DEXTROSE-NACL 20-5-0.45 MEQ/L-%-% IV SOLN
INTRAVENOUS | Status: DC
  Administered 2018-11-04: 22:00:00 via INTRAVENOUS
  Filled 2018-11-04: qty 1000

## 2018-11-04 MED ORDER — ONDANSETRON HCL 4 MG/2ML IJ SOLN: 4.0000 mg | Freq: Once | INTRAMUSCULAR | Status: DC | PRN

## 2018-11-04 MED ORDER — ACETAMINOPHEN 500 MG PO TABS
1000.0000 mg | ORAL_TABLET | Freq: Once | ORAL | Status: AC
  Administered 2018-11-04: 12:00:00 1000 mg via ORAL

## 2018-11-04 MED ORDER — BUPIVACAINE HCL (PF) 0.5 % IJ SOLN
INTRAMUSCULAR | Status: DC | PRN
  Administered 2018-11-04: 5 mL

## 2018-11-04 MED ORDER — SODIUM CHLORIDE 0.9% FLUSH
3.0000 mL | Freq: Two times a day (BID) | INTRAVENOUS | Status: DC
  Administered 2018-11-04 – 2018-11-07 (×6): 3 mL via INTRAVENOUS

## 2018-11-04 MED ORDER — DRONABINOL 2.5 MG PO CAPS
10.0000 mg | ORAL_CAPSULE | Freq: Two times a day (BID) | ORAL | Status: DC
  Administered 2018-11-04 – 2018-11-07 (×6): 10 mg via ORAL
  Filled 2018-11-04 (×6): qty 4

## 2018-11-04 MED ORDER — METHOCARBAMOL 1000 MG/10ML IJ SOLN
500.0000 mg | Freq: Four times a day (QID) | INTRAVENOUS | Status: DC | PRN
  Filled 2018-11-04: qty 5

## 2018-11-04 MED ORDER — SODIUM CHLORIDE 0.9 % IV SOLN
250.0000 mL | INTRAVENOUS | Status: DC
  Administered 2018-11-05: 250 mL via INTRAVENOUS

## 2018-11-04 MED ORDER — PANTOPRAZOLE SODIUM 40 MG IV SOLR
40.0000 mg | Freq: Every day | INTRAVENOUS | Status: DC
  Administered 2018-11-04 – 2018-11-05 (×2): 40 mg via INTRAVENOUS
  Filled 2018-11-04 (×2): qty 40

## 2018-11-04 MED ORDER — ONDANSETRON HCL 4 MG PO TABS: 4.0000 mg | ORAL_TABLET | Freq: Four times a day (QID) | ORAL | Status: DC | PRN

## 2018-11-04 MED ORDER — POMALIDOMIDE 2 MG PO CAPS: 2.0000 mg | ORAL_CAPSULE | ORAL | Status: DC

## 2018-11-04 MED ORDER — MORPHINE SULFATE ER 15 MG PO TBCR
15.0000 mg | EXTENDED_RELEASE_TABLET | Freq: Two times a day (BID) | ORAL | Status: DC
  Administered 2018-11-04 – 2018-11-07 (×6): 15 mg via ORAL
  Filled 2018-11-04 (×6): qty 1

## 2018-11-04 MED ORDER — VANCOMYCIN HCL IN DEXTROSE 1-5 GM/200ML-% IV SOLN
INTRAVENOUS | Status: AC
  Administered 2018-11-04: 12:00:00 1000 mg via INTRAVENOUS
  Filled 2018-11-04: qty 200

## 2018-11-04 MED ORDER — SODIUM CHLORIDE 0.9 % IV SOLN
INTRAVENOUS | Status: DC | PRN
  Administered 2018-11-04: 50 ug/min via INTRAVENOUS

## 2018-11-04 MED ORDER — POLYETHYLENE GLYCOL 3350 17 G PO PACK
17.0000 g | PACK | Freq: Every day | ORAL | Status: DC | PRN
  Administered 2018-11-06: 17 g via ORAL
  Filled 2018-11-04: qty 1

## 2018-11-04 MED ORDER — MIDAZOLAM HCL 2 MG/2ML IJ SOLN
INTRAMUSCULAR | Status: AC
  Filled 2018-11-04: qty 2

## 2018-11-04 MED ORDER — CALCIUM CARB-CHOLECALCIFEROL 600-800 MG-UNIT PO TABS: 1.0000 | ORAL_TABLET | Freq: Every day | ORAL | Status: DC

## 2018-11-04 MED ORDER — ALUM & MAG HYDROXIDE-SIMETH 200-200-20 MG/5ML PO SUSP: 30.0000 mL | Freq: Four times a day (QID) | ORAL | Status: DC | PRN

## 2018-11-04 MED ORDER — LIDOCAINE-EPINEPHRINE 1 %-1:100000 IJ SOLN
INTRAMUSCULAR | Status: DC | PRN
  Administered 2018-11-04: 5 mL

## 2018-11-04 MED ORDER — ROCURONIUM BROMIDE 100 MG/10ML IV SOLN
INTRAVENOUS | Status: DC | PRN
  Administered 2018-11-04 (×2): 50 mg via INTRAVENOUS

## 2018-11-04 MED ORDER — SUCCINYLCHOLINE CHLORIDE 20 MG/ML IJ SOLN
INTRAMUSCULAR | Status: DC | PRN
  Administered 2018-11-04: 100 mg via INTRAVENOUS

## 2018-11-04 MED ORDER — ACYCLOVIR 400 MG PO TABS
400.0000 mg | ORAL_TABLET | Freq: Two times a day (BID) | ORAL | Status: DC
  Administered 2018-11-04 – 2018-11-07 (×6): 400 mg via ORAL
  Filled 2018-11-04 (×6): qty 1

## 2018-11-04 MED ORDER — FENTANYL CITRATE (PF) 100 MCG/2ML IJ SOLN
25.0000 ug | INTRAMUSCULAR | Status: DC | PRN
  Administered 2018-11-04 (×2): 50 ug via INTRAVENOUS

## 2018-11-04 MED ORDER — ONDANSETRON HCL 4 MG/2ML IJ SOLN: 4.0000 mg | Freq: Four times a day (QID) | INTRAMUSCULAR | Status: DC | PRN

## 2018-11-04 MED ORDER — GABAPENTIN 300 MG PO CAPS
300.0000 mg | ORAL_CAPSULE | Freq: Three times a day (TID) | ORAL | Status: DC
  Administered 2018-11-04 – 2018-11-07 (×8): 300 mg via ORAL
  Filled 2018-11-04 (×8): qty 1

## 2018-11-04 MED ORDER — THROMBIN 5000 UNITS EX SOLR
OROMUCOSAL | Status: DC | PRN
  Administered 2018-11-04 (×2): 5 mL via TOPICAL

## 2018-11-04 MED ORDER — ACETAMINOPHEN 325 MG PO TABS
650.0000 mg | ORAL_TABLET | ORAL | Status: DC | PRN
  Administered 2018-11-06: 650 mg via ORAL
  Filled 2018-11-04: qty 2

## 2018-11-04 MED ORDER — DEXAMETHASONE SODIUM PHOSPHATE 4 MG/ML IJ SOLN
4.0000 mg | Freq: Four times a day (QID) | INTRAMUSCULAR | Status: DC
  Administered 2018-11-04 – 2018-11-06 (×4): 4 mg via INTRAVENOUS
  Filled 2018-11-04 (×4): qty 1

## 2018-11-04 MED ORDER — HYDROCODONE-ACETAMINOPHEN 5-325 MG PO TABS
2.0000 | ORAL_TABLET | ORAL | Status: DC | PRN
  Administered 2018-11-06: 2 via ORAL
  Filled 2018-11-04: qty 2

## 2018-11-04 MED ORDER — FENTANYL CITRATE (PF) 250 MCG/5ML IJ SOLN
INTRAMUSCULAR | Status: AC
  Filled 2018-11-04: qty 5

## 2018-11-04 SURGICAL SUPPLY — 75 items
BASKET BONE COLLECTION (BASKET) ×3 IMPLANT
BIT DRILL NEURO 2X3.1 SFT TUCH (MISCELLANEOUS) ×1 IMPLANT
BIT DRILL POWER (BIT) IMPLANT
BLADE ULTRA TIP 2M (BLADE) ×1 IMPLANT
BNDG GAUZE ELAST 4 BULKY (GAUZE/BANDAGES/DRESSINGS) IMPLANT
BUR BARREL STRAIGHT FLUTE 4.0 (BURR) ×3 IMPLANT
CAGE CORE MONOLITH 12X10 (Cage) ×2 IMPLANT
CANISTER SUCT 3000ML PPV (MISCELLANEOUS) ×3 IMPLANT
CAP END MONOLITH PARA 14 RND (Cap) IMPLANT
CARTRIDGE OIL MAESTRO DRILL (MISCELLANEOUS) ×1 IMPLANT
COVER MAYO STAND STRL (DRAPES) ×1 IMPLANT
COVER WAND RF STERILE (DRAPES) ×1 IMPLANT
DECANTER SPIKE VIAL GLASS SM (MISCELLANEOUS) ×3 IMPLANT
DERMABOND ADVANCED (GAUZE/BANDAGES/DRESSINGS) ×2
DERMABOND ADVANCED .7 DNX12 (GAUZE/BANDAGES/DRESSINGS) ×1 IMPLANT
DIFFUSER DRILL AIR PNEUMATIC (MISCELLANEOUS) ×3 IMPLANT
DRAIN JACKSON PRATT 10MM FLAT (MISCELLANEOUS) ×2 IMPLANT
DRAPE C-ARM 42X72 X-RAY (DRAPES) ×4 IMPLANT
DRAPE HALF SHEET 40X57 (DRAPES) IMPLANT
DRAPE LAPAROTOMY 100X72 PEDS (DRAPES) ×3 IMPLANT
DRAPE MICROSCOPE LEICA (MISCELLANEOUS) ×3 IMPLANT
DRAPE POUCH INSTRU U-SHP 10X18 (DRAPES) ×1 IMPLANT
DRILL BIT POWER (BIT) ×2
DRILL NEURO 2X3.1 SOFT TOUCH (MISCELLANEOUS) ×3
DRSG OPSITE POSTOP 3X4 (GAUZE/BANDAGES/DRESSINGS) ×2 IMPLANT
DURAPREP 6ML APPLICATOR 50/CS (WOUND CARE) ×3 IMPLANT
ELECT COATED BLADE 2.86 ST (ELECTRODE) ×3 IMPLANT
ELECT REM PT RETURN 9FT ADLT (ELECTROSURGICAL) ×3
ELECTRODE REM PT RTRN 9FT ADLT (ELECTROSURGICAL) ×1 IMPLANT
ENDCAP MONOLITH PARA 14 RND (Cap) ×4 IMPLANT
EVACUATOR SILICONE 100CC (DRAIN) ×2 IMPLANT
GAUZE 4X4 16PLY RFD (DISPOSABLE) IMPLANT
GAUZE SPONGE 4X4 12PLY STRL (GAUZE/BANDAGES/DRESSINGS) IMPLANT
GLOVE BIO SURGEON STRL SZ8 (GLOVE) ×3 IMPLANT
GLOVE BIOGEL PI IND STRL 7.5 (GLOVE) IMPLANT
GLOVE BIOGEL PI IND STRL 8 (GLOVE) ×1 IMPLANT
GLOVE BIOGEL PI IND STRL 8.5 (GLOVE) ×1 IMPLANT
GLOVE BIOGEL PI INDICATOR 7.5 (GLOVE) ×2
GLOVE BIOGEL PI INDICATOR 8 (GLOVE) ×10
GLOVE BIOGEL PI INDICATOR 8.5 (GLOVE) ×2
GLOVE ECLIPSE 6.5 STRL STRAW (GLOVE) ×2 IMPLANT
GLOVE ECLIPSE 7.5 STRL STRAW (GLOVE) ×6 IMPLANT
GLOVE ECLIPSE 8.0 STRL XLNG CF (GLOVE) ×3 IMPLANT
GLOVE EXAM NITRILE XL STR (GLOVE) IMPLANT
GOWN STRL REUS W/ TWL LRG LVL3 (GOWN DISPOSABLE) IMPLANT
GOWN STRL REUS W/ TWL XL LVL3 (GOWN DISPOSABLE) IMPLANT
GOWN STRL REUS W/TWL 2XL LVL3 (GOWN DISPOSABLE) ×6 IMPLANT
GOWN STRL REUS W/TWL LRG LVL3 (GOWN DISPOSABLE) ×2
GOWN STRL REUS W/TWL XL LVL3 (GOWN DISPOSABLE) ×2
HALTER HD/CHIN CERV TRACTION D (MISCELLANEOUS) ×3 IMPLANT
HEMOSTAT POWDER KIT SURGIFOAM (HEMOSTASIS) ×5 IMPLANT
KIT BASIN OR (CUSTOM PROCEDURE TRAY) ×3 IMPLANT
KIT TURNOVER KIT B (KITS) ×3 IMPLANT
NDL HYPO 25X1 1.5 SAFETY (NEEDLE) ×1 IMPLANT
NDL SPNL 22GX3.5 QUINCKE BK (NEEDLE) ×1 IMPLANT
NEEDLE HYPO 25X1 1.5 SAFETY (NEEDLE) ×3 IMPLANT
NEEDLE SPNL 22GX3.5 QUINCKE BK (NEEDLE) ×3 IMPLANT
NS IRRIG 1000ML POUR BTL (IV SOLUTION) ×3 IMPLANT
OIL CARTRIDGE MAESTRO DRILL (MISCELLANEOUS) ×3
PACK LAMINECTOMY NEURO (CUSTOM PROCEDURE TRAY) ×3 IMPLANT
PAD ARMBOARD 7.5X6 YLW CONV (MISCELLANEOUS) ×9 IMPLANT
PIN DISTRACTION 14MM (PIN) ×6 IMPLANT
PLATE ARCHON 2-LEVEL 32MM (Plate) ×2 IMPLANT
PUTTY DBX 1CC (Putty) ×3 IMPLANT
PUTTY DBX 1CC DEPUY (Putty) IMPLANT
RUBBERBAND STERILE (MISCELLANEOUS) ×6 IMPLANT
SCREW ARCHON SELFTAP 4.0X13MM (Screw) ×6 IMPLANT
SCREW ARCHON ST VAR 4.5X13MM (Screw) ×2 IMPLANT
SPONGE INTESTINAL PEANUT (DISPOSABLE) ×7 IMPLANT
SPONGE SURGIFOAM ABS GEL SZ50 (HEMOSTASIS) IMPLANT
STAPLER SKIN PROX WIDE 3.9 (STAPLE) ×2 IMPLANT
SUT VIC AB 3-0 SH 8-18 (SUTURE) ×5 IMPLANT
TOWEL GREEN STERILE (TOWEL DISPOSABLE) ×3 IMPLANT
TOWEL GREEN STERILE FF (TOWEL DISPOSABLE) ×3 IMPLANT
WATER STERILE IRR 1000ML POUR (IV SOLUTION) ×3 IMPLANT

## 2018-11-04 NOTE — Anesthesia Procedure Notes (Signed)
Procedure Name: Intubation Date/Time: 11/04/2018 2:05 PM Performed by: Lavell Luster, CRNA Pre-anesthesia Checklist: Patient identified, Emergency Drugs available, Suction available, Patient being monitored and Timeout performed Patient Re-evaluated:Patient Re-evaluated prior to induction Oxygen Delivery Method: Circle system utilized Preoxygenation: Pre-oxygenation with 100% oxygen Induction Type: IV induction Ventilation: Mask ventilation without difficulty Laryngoscope Size: Mac and 4 Grade View: Grade I Tube type: Oral Tube size: 7.5 mm Number of attempts: 1 Airway Equipment and Method: Stylet,  Video-laryngoscopy and Rigid stylet Placement Confirmation: ETT inserted through vocal cords under direct vision,  positive ETCO2 and breath sounds checked- equal and bilateral Secured at: 22 cm Tube secured with: Tape Dental Injury: Teeth and Oropharynx as per pre-operative assessment

## 2018-11-04 NOTE — Interval H&P Note (Signed)
History and Physical Interval Note:  11/04/2018 1:20 PM  Jesus [name]  has presented today for surgery, with the diagnosis of Cervical stenosis of spinal canal.  The various methods of treatment have been discussed with the patient and family. After consideration of risks, benefits and other options for treatment, the patient has consented to  Procedure(s) with comments: Cervical 3 Corpectomy with Cervical 2 to Cervical 4 Plating (N/A) - Cervical 3 Corpectomy with Cervical 2 to Cervical 4 Plating as a surgical intervention.  The patient's history has been reviewed, patient examined, no change in status, stable for surgery.  I have reviewed the patient's chart and labs.  Questions were answered to the patient's satisfaction.     Peggyann Shoals

## 2018-11-04 NOTE — Op Note (Signed)
11/04/2018  4:20 PM  PATIENT:  Jesus [name]  63 y.o. male  PRE-OPERATIVE DIAGNOSIS:  Multiple myeloma, compression fracture C 3, Cervical stenosis of spinal canal, cervicalgia  POST-OPERATIVE DIAGNOSIS:  Multiple myeloma, compression fracture C 3, Cervical stenosis of spinal canal, cervicalgia  PROCEDURE:  Procedure(s): Cervical three Corpectomy with Cervical two to Cervical four Plating (N/A) with PEEK cage, DBM  SURGEON:  Surgeon(s) and Role:    Erline Levine, MD - Primary    * Ashok Pall, MD - Assisting  PHYSICIAN ASSISTANT:   ASSISTANTS: Poteat, RN   ANESTHESIA:   general  EBL:  150 mL   BLOOD ADMINISTERED:none  DRAINS: (#10) Jackson-Pratt drain(s) with closed bulb suction in the prevertebral space   LOCAL MEDICATIONS USED:  MARCAINE    and LIDOCAINE   SPECIMEN:  Excision  DISPOSITION OF SPECIMEN:  PATHOLOGY  COUNTS:  YES  TOURNIQUET:  * No tourniquets in log *  DICTATION: Patient is 63 year old male with multiple myeloma with compression fracture of C 3 and cord compression with kyphotic deformity and intractable neck pain.     It was elected to take him to surgery for anterior cervical decompression with corpectomy of C 3, corpectomy cage C 2-C 4 levels and anterior cervical plating C 2- C 4 levels.    PROCEDURE: Patient was brought to operating room and following the smooth and uncomplicated induction of general endotracheal anesthesia his head was placed on a horseshoe head holder he was placed in 5 pounds of Holter traction and his anterior neck was prepped and draped in usual sterile fashion. An incision was made on the left side of midline after infiltrating the skin and subcutaneous tissues with local lidocaine. The platysmal layer was incised and subplatysmal dissection was performed exposing the anterior border sternocleidomastoid muscle. Using blunt dissection the carotid sheath was kept lateral and trachea and esophagus kept medial exposing the  anterior cervical spine. A bent spinal needle was placed it was felt to be the C 34 level and this was confirmed on intraoperative x-ray with C arm. Longus coli muscles were taken down from the anterior cervical spine using electrocautery and key elevator and self-retaining retractor was placed exposing the C 2 - C 4 levels. The interspaces at C 34 and C 23  were incised and a thorough discectomy was performed. The vertebral body had been destroyed by tumor and this was removed, decompressing the spinal cord dura.  Hemostasis was assured with Surgifoam. After trial sizing a 16 mm Monolith Nuvasive corpectomy cage with 14 mm endcaps was packed with DBM and inserted into the corpectomy defect and tamped into position.    This was tamped into position and countersunk appropriately. Distraction weight was removed. A 32 mm Archon anterior cervical plate was affixed to the cervical spine with 13 mm fixed angle screws 2 at C2, 2 at C4. All screws were well-positioned and locking mechanisms were engaged. A final X ray was obtained which showed well positioned graft and anterior plate without complicating features. Soft tissues were inspected and found to be in good repair. The wound was irrigated. A # 10 JP drain was placed through a separate stab incision.  The platysma layer was closed with 3-0 Vicryl stitches and the skin was reapproximated with 3-0 Vicryl subcuticular stitches. The wound was dressed with Dermabond and an occlusive dressing. Counts were correct at the end of the case. Patient was extubated and taken to recovery in stable and satisfactory condition.  PLAN OF CARE: Admit to inpatient   PATIENT DISPOSITION:  PACU - hemodynamically stable.   Delay start of Pharmacological VTE agent (>24hrs) due to surgical blood loss or risk of bleeding: yes

## 2018-11-04 NOTE — Brief Op Note (Signed)
11/04/2018  4:20 PM  PATIENT:  Jesus [name]  63 y.o. male  PRE-OPERATIVE DIAGNOSIS:  Multiple myeloma, compression fracture C 3, Cervical stenosis of spinal canal, cervicalgia  POST-OPERATIVE DIAGNOSIS:  Multiple myeloma, compression fracture C 3, Cervical stenosis of spinal canal, cervicalgia  PROCEDURE:  Procedure(s): Cervical three Corpectomy with Cervical two to Cervical four Plating (N/A) with PEEK cage, DBM  SURGEON:  Surgeon(s) and Role:    * Asier Desroches, MD - Primary    * Cabbell, Kyle, MD - Assisting  PHYSICIAN ASSISTANT:   ASSISTANTS: Poteat, RN   ANESTHESIA:   general  EBL:  150 mL   BLOOD ADMINISTERED:none  DRAINS: (#10) Jackson-Pratt drain(s) with closed bulb suction in the prevertebral space   LOCAL MEDICATIONS USED:  MARCAINE    and LIDOCAINE   SPECIMEN:  Excision  DISPOSITION OF SPECIMEN:  PATHOLOGY  COUNTS:  YES  TOURNIQUET:  * No tourniquets in log *  DICTATION: Patient is 63 year old male with multiple myeloma with compression fracture of C 3 and cord compression with kyphotic deformity and intractable neck pain.     It was elected to take him to surgery for anterior cervical decompression with corpectomy of C 3, corpectomy cage C 2-C 4 levels and anterior cervical plating C 2- C 4 levels.    PROCEDURE: Patient was brought to operating room and following the smooth and uncomplicated induction of general endotracheal anesthesia his head was placed on a horseshoe head holder he was placed in 5 pounds of Holter traction and his anterior neck was prepped and draped in usual sterile fashion. An incision was made on the left side of midline after infiltrating the skin and subcutaneous tissues with local lidocaine. The platysmal layer was incised and subplatysmal dissection was performed exposing the anterior border sternocleidomastoid muscle. Using blunt dissection the carotid sheath was kept lateral and trachea and esophagus kept medial exposing the  anterior cervical spine. A bent spinal needle was placed it was felt to be the C 34 level and this was confirmed on intraoperative x-ray with C arm. Longus coli muscles were taken down from the anterior cervical spine using electrocautery and key elevator and self-retaining retractor was placed exposing the C 2 - C 4 levels. The interspaces at C 34 and C 23  were incised and a thorough discectomy was performed. The vertebral body had been destroyed by tumor and this was removed, decompressing the spinal cord dura.  Hemostasis was assured with Surgifoam. After trial sizing a 16 mm Monolith Nuvasive corpectomy cage with 14 mm endcaps was packed with DBM and inserted into the corpectomy defect and tamped into position.    This was tamped into position and countersunk appropriately. Distraction weight was removed. A 32 mm Archon anterior cervical plate was affixed to the cervical spine with 13 mm fixed angle screws 2 at C2, 2 at C4. All screws were well-positioned and locking mechanisms were engaged. A final X ray was obtained which showed well positioned graft and anterior plate without complicating features. Soft tissues were inspected and found to be in good repair. The wound was irrigated. A # 10 JP drain was placed through a separate stab incision.  The platysma layer was closed with 3-0 Vicryl stitches and the skin was reapproximated with 3-0 Vicryl subcuticular stitches. The wound was dressed with Dermabond and an occlusive dressing. Counts were correct at the end of the case. Patient was extubated and taken to recovery in stable and satisfactory condition.     PLAN OF CARE: Admit to inpatient   PATIENT DISPOSITION:  PACU - hemodynamically stable.   Delay start of Pharmacological VTE agent (>24hrs) due to surgical blood loss or risk of bleeding: yes   

## 2018-11-04 NOTE — Anesthesia Preprocedure Evaluation (Addendum)
Anesthesia Evaluation  Patient identified by MRN, date of birth, ID band Patient awake    Reviewed: Allergy & Precautions, NPO status , Patient's Chart, lab work & pertinent test results  Airway Mallampati: II  TM Distance: >3 FB Neck ROM: Full    Dental  (+) Teeth Intact, Dental Advisory Given   Pulmonary neg pulmonary ROS,    Pulmonary exam normal breath sounds clear to auscultation       Cardiovascular negative cardio ROS Normal cardiovascular exam Rhythm:Regular Rate:Normal     Neuro/Psych Cervical stenosis of spinal canal  negative psych ROS   GI/Hepatic negative GI ROS, Neg liver ROS,   Endo/Other  negative endocrine ROS  Renal/GU negative Renal ROS     Musculoskeletal  (+) Arthritis ,   Abdominal   Peds  Hematology negative hematology ROS (+)   Anesthesia Other Findings Day of surgery medications reviewed with the patient.  Reproductive/Obstetrics                            Anesthesia Physical Anesthesia Plan  ASA: II  Anesthesia Plan: General   Post-op Pain Management:    Induction: Intravenous  PONV Risk Score and Plan: 2 and Treatment may vary due to age or medical condition  Airway Management Planned: Oral ETT  Additional Equipment:   Intra-op Plan:   Post-operative Plan: Extubation in OR  Informed Consent: I have reviewed the patients History and Physical, chart, labs and discussed the procedure including the risks, benefits and alternatives for the proposed anesthesia with the patient or authorized representative who has indicated his/her understanding and acceptance.     Dental advisory given  Plan Discussed with: CRNA  Anesthesia Plan Comments:         Anesthesia Quick Evaluation

## 2018-11-04 NOTE — Progress Notes (Addendum)
Pharmacy Antibiotic Note  Jesus [name] is a 63 y.o. male admitted on 11/04/2018 with surgical drain in place.  Pharmacy has been consulted for Vancomycin dosing.  Patient with penicillin allergy and JP drain in place post op  Vancomycin 750 mg IV Q 12 hrs. Goal AUC 400-550. Expected AUC: 488 SCr used: 0.8  Plan: Vancomycin 750 mg IV q12hr Monitor for duration of drain for LOT and renal function  Height: 5\' 7"  (170.2 cm) Weight: 121 lb 0.5 oz (54.9 kg) IBW/kg (Calculated) : 66.1  Temp (24hrs), Avg:97.7 F (36.5 C), Min:97 F (36.1 C), Max:98.4 F (36.9 C)  No results for input(s): WBC, CREATININE, LATICACIDVEN, VANCOTROUGH, VANCOPEAK, VANCORANDOM, GENTTROUGH, GENTPEAK, GENTRANDOM, TOBRATROUGH, TOBRAPEAK, TOBRARND, AMIKACINPEAK, AMIKACINTROU, AMIKACIN in the last 168 hours.  Estimated Creatinine Clearance: 73.4 mL/min (by C-G formula based on SCr of 0.71 mg/dL).    Allergies  Allergen Reactions  . Penicillins Tinitus    Did it involve swelling of the face/tongue/throat, SOB, or low BP? No Did it involve sudden or severe rash/hives, skin peeling, or any reaction on the inside of your mouth or nose? No Did you need to seek medical attention at a hospital or doctor's office? Yes When did it last happen? More than 30 years ago If all above answers are "NO", may proceed with cephalosporin use.     Thank you for allowing pharmacy to be a part of this patient's care.  Alanda Slim, PharmD, [facility] Clinical Pharmacist Please see AMION for all Pharmacists' Contact Phone Numbers 11/04/2018, 9:32 PM

## 2018-11-04 NOTE — Progress Notes (Signed)
Received in report that patient needed a new type and screen. Lab had been called but patient was taken to OR prior to lab being drawn. Anderson Malta, [place] nurse made aware. Stated patient had blood ready in the blood bank and "everything is fine now".

## 2018-11-04 NOTE — Transfer of Care (Signed)
Immediate Anesthesia Transfer of Care Note  Patient: Jesus [name]  Procedure(s) Performed: Cervical three Corpectomy with Cervical two to Cervical four Plating (N/A Neck)  Patient Location: PACU  Anesthesia Type:General  Level of Consciousness: awake, drowsy and patient cooperative  Airway & Oxygen Therapy: Patient Spontanous Breathing and Patient connected to face mask oxygen  Post-op Assessment: Report given to RN and Post -op Vital signs reviewed and stable  Post vital signs: Reviewed and stable  Last Vitals:  Vitals Value Taken Time  BP    Temp    Pulse 86 11/04/18 1630  Resp 17 11/04/18 1630  SpO2 100 % 11/04/18 1630  Vitals shown include unvalidated device data.  Last Pain:  Vitals:   11/04/18 1212  TempSrc:   PainSc: 0-No pain      Patients Stated Pain Goal: 4 ([id number])  Complications: No apparent anesthesia complications

## 2018-11-05 LAB — GLUCOSE, CAPILLARY: Glucose-Capillary: 166 mg/dL — ABNORMAL HIGH (ref 70–99)

## 2018-11-05 NOTE — Anesthesia Postprocedure Evaluation (Signed)
Anesthesia Post Note  Patient: Jesus [name]  Procedure(s) Performed: Cervical three Corpectomy with Cervical two to Cervical four Plating (N/A Neck)     Patient location during evaluation: PACU Anesthesia Type: General Level of consciousness: awake and alert Pain management: pain level controlled Vital Signs Assessment: post-procedure vital signs reviewed and stable Respiratory status: spontaneous breathing, nonlabored ventilation, respiratory function stable and patient connected to nasal cannula oxygen Cardiovascular status: blood pressure returned to baseline and stable Postop Assessment: no apparent nausea or vomiting Anesthetic complications: no    Last Vitals:  Vitals:   11/05/18 0700 11/05/18 0745  BP:  128/73  Pulse: 94 88  Resp:  16  Temp:  36.8 C  SpO2: 100% 100%    Last Pain:  Vitals:   11/05/18 0745  TempSrc: Oral  PainSc:                  Catalina Gravel

## 2018-11-05 NOTE — Evaluation (Signed)
Physical Therapy Evaluation Patient Details Name: Jesus [name] MRN: [id number] DOB: [birth year] Today's Date: 11/05/2018   History of Present Illness  63yo male s/p C3 corpectomy and C2-C4 plating 11/04/18. PMH cervical stenosis, CA, OA, L rotator cuff repair, back surgery  Clinical Impression   Patient received in bed with aspen vista collar applied, pleasant and willing to work with skilled PT services today, A&Ox4. Verbally reviewed cervical precautions, then able to perform rolling and sidelying to sit with Min guard and VC for correct technique. Able to perform functional transfers and gait approximately 133f with RW and S, VC to maintain cervical precautions with dynamic tasks and activities. He was left up in the chair with all needs met, positioned to comfort with brace on and all needs otherwise met. He will continue to benefit from skilled PT services in the acute setting, currently feel he will not need PT follow up immediately after DC- but likely will benefit from skilled OP PT services once cleared by MD/surgeon.     Follow Up Recommendations Outpatient PT(once cleared by MD/surgeon)    Equipment Recommendations  None recommended by PT    Recommendations for Other Services       Precautions / Restrictions Precautions Precautions: Cervical Precaution Booklet Issued: No(verbally reviewed precautions) Precaution Comments: aspen vista on when OOB, applied in sitting Required Braces or Orthoses: Cervical Brace Cervical Brace: Hard collar Restrictions Weight Bearing Restrictions: No      Mobility  Bed Mobility Overal bed mobility: Needs Assistance Bed Mobility: Rolling;Sidelying to Sit Rolling: Min guard Sidelying to sit: Min guard       General bed mobility comments: Min guard and VC for all tasks to maintain cervical precautions with mobility  Transfers Overall transfer level: Needs assistance Equipment used: Rolling walker (2 wheeled) Transfers: Sit to/from  Stand Sit to Stand: Supervision         General transfer comment: S for safety, VC for hand placement, no physical assist given  Ambulation/Gait Ambulation/Gait assistance: Supervision Gait Distance (Feet): 100 Feet Assistive device: Rolling walker (2 wheeled) Gait Pattern/deviations: WFL(Within Functional Limits);Step-through pattern Gait velocity: WNL   General Gait Details: gait pattern generally WNL, cues to maintain cervical precautions with turns and during dynamic tasks  Stairs            Wheelchair Mobility    Modified Rankin (Stroke Patients Only)       Balance Overall balance assessment: Mild deficits observed, not formally tested                                           Pertinent Vitals/Pain Pain Assessment: 0-10 Pain Score: 4  Pain Location: neck with gait Pain Descriptors / Indicators: Aching;Discomfort Pain Intervention(s): Limited activity within patient's tolerance;Monitored during session;Repositioned    Home Living Family/patient expects to be discharged to:: Private residence Living Arrangements: Spouse/significant other Available Help at Discharge: Available 24 hours/day Type of Home: House Home Access: Stairs to enter Entrance Stairs-Rails: None Entrance Stairs-Number of Steps: 1, no railings but can hold onto a pillar Home Layout: Two level;Able to live on main level with bedroom/bathroom Home Equipment: CKasandra Knudsen- single point      Prior Function Level of Independence: Independent with assistive device(s)               Hand Dominance   Dominant Hand: Right    Extremity/Trunk Assessment  Upper Extremity Assessment Upper Extremity Assessment: Defer to OT evaluation    Lower Extremity Assessment Lower Extremity Assessment: Overall WFL for tasks assessed    Cervical / Trunk Assessment Cervical / Trunk Assessment: Normal  Communication   Communication: No difficulties  Cognition Arousal/Alertness:  Awake/alert Behavior During Therapy: WFL for tasks assessed/performed Overall Cognitive Status: Within Functional Limits for tasks assessed                                 General Comments: A&Ox4      General Comments      Exercises     Assessment/Plan    PT Assessment Patient needs continued PT services  PT Problem List Decreased knowledge of use of DME;Decreased activity tolerance;Decreased safety awareness;Decreased knowledge of precautions;Pain;Decreased mobility       PT Treatment Interventions DME instruction;Balance training;Gait training;Neuromuscular re-education;Stair training;Functional mobility training;Patient/family education;Therapeutic activities;Therapeutic exercise;Manual techniques    PT Goals (Current goals can be found in the Care Plan section)  Acute Rehab PT Goals Patient Stated Goal: less pain in neck/go home PT Goal Formulation: With patient Time For Goal Achievement: 11/19/18 Potential to Achieve Goals: Good    Frequency Min 5X/week   Barriers to discharge        Co-evaluation               AM-PAC PT "6 Clicks" Mobility  Outcome Measure Help needed turning from your back to your side while in a flat bed without using bedrails?: None Help needed moving from lying on your back to sitting on the side of a flat bed without using bedrails?: None Help needed moving to and from a bed to a chair (including a wheelchair)?: None Help needed standing up from a chair using your arms (e.g., wheelchair or bedside chair)?: None Help needed to walk in hospital room?: A Little Help needed climbing 3-5 steps with a railing? : A Little 6 Click Score: 22    End of Session Equipment Utilized During Treatment: Gait belt Activity Tolerance: Patient tolerated treatment well Patient left: in chair;with call bell/phone within reach   PT Visit Diagnosis: Difficulty in walking, not elsewhere classified (R26.2);Other symptoms and signs involving  the nervous system (R29.898);Pain Pain - Right/Left: (neck pain) Pain - part of body: (neck pain)    Time: 2952-8413 PT Time Calculation (min) (ACUTE ONLY): 32 min   Charges:   PT Evaluation $PT Eval Low Complexity: 1 Low PT Treatments $Gait Training: 8-22 mins       Deniece Ree PT, DPT, CBIS  Supplemental Physical Therapist [place]    Pager [phone] Acute Rehab Office [phone]

## 2018-11-05 NOTE — Plan of Care (Signed)
  Problem: Education: Goal: Knowledge of General Education information will improve Description: Including pain rating scale, medication(s)/side effects and non-pharmacologic comfort measures Outcome: Progressing   Problem: Health Behavior/Discharge Planning: Goal: Ability to manage health-related needs will improve Outcome: Progressing   Problem: Clinical Measurements: Goal: Ability to maintain clinical measurements within normal limits will improve Outcome: Progressing Goal: Will remain free from infection Outcome: Progressing Goal: Cardiovascular complication will be avoided Outcome: Progressing   Problem: Activity: Goal: Risk for activity intolerance will decrease Outcome: Progressing   Problem: Nutrition: Goal: Adequate nutrition will be maintained Outcome: Progressing   Problem: Elimination: Goal: Will not experience complications related to bowel motility Outcome: Progressing   Problem: Pain Managment: Goal: General experience of comfort will improve Outcome: Progressing   Problem: Safety: Goal: Ability to remain free from injury will improve Outcome: Progressing   Problem: Skin Integrity: Goal: Risk for impaired skin integrity will decrease Outcome: Progressing

## 2018-11-05 NOTE — Evaluation (Addendum)
Occupational Therapy Evaluation Patient Details Name: TORRION GOULDER MRN: [id number] DOB: [birth year] Today's Date: 11/05/2018    History of Present Illness 63yo male s/p C3 corpectomy and C2-C4 plating 11/04/18. PMH cervical stenosis, CA, OA, L rotator cuff repair, back surgery   Clinical Impression   Pt admitted with above dx, presenting with mild limitations 2/2 post sx pain and precautions. PTA pt ind, with increasing neck pain. At time of eval, he supervision for OOB transfers and functional mobility. Administered cervical precaution handout and reviewed ADL implications- pt in understanding. Pt don/don aspen with no physical assist, educate don washing/wearing aspen for home. Wife assists in home as needed. No further OT needs identified, OT will sign off for one time eval. Thank you for this consult.     Follow Up Recommendations  No OT follow up    Equipment Recommendations  None recommended by OT    Recommendations for Other Services       Precautions / Restrictions Precautions Precautions: Cervical Precaution Booklet Issued: Yes (comment) Precaution Comments: aspen vista on when OOB, applied in sitting Required Braces or Orthoses: Cervical Brace Cervical Brace: Hard collar Restrictions Weight Bearing Restrictions: No      Mobility Bed Mobility Overal bed mobility: Needs Assistance Bed Mobility: Rolling;Sidelying to Sit Rolling: Min guard Sidelying to sit: Min guard       General bed mobility comments: up in chair  Transfers Overall transfer level: Needs assistance Equipment used: None Transfers: Sit to/from Stand Sit to Stand: Supervision         General transfer comment: S for safety, VC for hand placement, no physical assist given    Balance Overall balance assessment: Mild deficits observed, not formally tested                                         ADL either performed or assessed with clinical judgement   ADL Overall ADL's  : Needs assistance/impaired Eating/Feeding: Independent   Grooming: Standing;Modified independent   Upper Body Bathing: Moderate assistance;Sitting;Standing   Lower Body Bathing: Supervison/ safety;Sit to/from stand;Sitting/lateral leans;Adhering to back precautions   Upper Body Dressing : Modified independent;Sitting;Standing Upper Body Dressing Details (indicate cue type and reason): ablel to don/doff brace without difficulty Lower Body Dressing: Modified independent;Sit to/from stand;Sitting/lateral leans   Toilet Transfer: Supervision/safety;Regular Museum/gallery exhibitions officer and Hygiene: Modified independent   Tub/ Banker: Modified independent   Functional mobility during ADLs: Supervision/safety General ADL Comments: mildly limited by post sx precautions and pain     Vision Baseline Vision/History: No visual deficits Patient Visual Report: No change from baseline       Perception     Praxis      Pertinent Vitals/Pain Pain Assessment: 0-10 Pain Score: 4  Pain Location: neck with gait Pain Descriptors / Indicators: Aching;Discomfort Pain Intervention(s): Limited activity within patient's tolerance;Monitored during session;Repositioned     Hand Dominance Right   Extremity/Trunk Assessment Upper Extremity Assessment Upper Extremity Assessment: Overall WFL for tasks assessed   Lower Extremity Assessment Lower Extremity Assessment: Defer to PT evaluation   Cervical / Trunk Assessment Cervical / Trunk Assessment: Normal   Communication Communication Communication: No difficulties   Cognition Arousal/Alertness: Awake/alert Behavior During Therapy: WFL for tasks assessed/performed Overall Cognitive Status: Within Functional Limits for tasks assessed  General Comments: Appears to have some higher level cognitive functions, given hand out for carryover to home   General Comments        Exercises     Shoulder Instructions      Home Living Family/patient expects to be discharged to:: Private residence Living Arrangements: Spouse/significant other Available Help at Discharge: Available 24 hours/day Type of Home: House Home Access: Stairs to enter [employer] of Steps: 1, no railings but can hold onto a pillar Entrance Stairs-Rails: None Home Layout: Two level;Able to live on main level with bedroom/bathroom Alternate Level Stairs-Number of Steps: flight   Bathroom Shower/Tub: Occupational psychologist: Standard Bathroom Accessibility: Yes   Home Equipment: Cane - single point          Prior Functioning/Environment Level of Independence: Independent with assistive device(s)                 OT Problem List: Decreased knowledge of use of DME or AE;Decreased knowledge of precautions;Pain      OT Treatment/Interventions:      OT Goals(Current goals can be found in the care plan section) Acute Rehab OT Goals Patient Stated Goal: go home soon with less neck pain OT Goal Formulation: With patient Time For Goal Achievement: 11/19/18 Potential to Achieve Goals: Good  OT Frequency:     Barriers to D/C:            Co-evaluation              AM-PAC OT "6 Clicks" Daily Activity     Outcome Measure Help from another person eating meals?: None Help from another person taking care of personal grooming?: None Help from another person toileting, which includes using toliet, bedpan, or urinal?: None Help from another person bathing (including washing, rinsing, drying)?: A Little Help from another person to put on and taking off regular upper body clothing?: None Help from another person to put on and taking off regular lower body clothing?: A Little 6 Click Score: 22   End of Session Equipment Utilized During Treatment: Gait belt;Cervical collar Nurse Communication: Mobility status  Activity Tolerance: Patient tolerated treatment  well Patient left: in chair;with call bell/phone within reach;with chair alarm set  OT Visit Diagnosis: Other abnormalities of gait and mobility (R26.89);Pain Pain - part of body: (cervical)                Time: [id number] OT Time Calculation (min): 10 min Charges:  OT General Charges $OT Visit: 1 Visit OT Evaluation $OT Eval Low Complexity: 1 Low  Zenovia Jarred, MSOT, OTR/L [place] OT/ Acute Relief OT [facility] Office: [place] 11/05/2018, 10:00 AM

## 2018-11-05 NOTE — Progress Notes (Signed)
CSW consulted for skilled nursing facility placement. CSW notes current recommendation for outpatient PT. CSW will continue to follow to provide assistance and monitor for disposition changes.

## 2018-11-05 NOTE — Progress Notes (Signed)
Patient ID: Jesus [name], male   DOB: 08/04/1955, 63 y.o.   MRN: [id number] Seems to be doing very well.  Has appropriate neck soreness.  No arm pain or numbness tingling or weakness.  Drain is in place.  He is moving his extremities well.  Likely home tomorrow.  Advance diet.

## 2018-11-06 LAB — GLUCOSE, CAPILLARY
Glucose-Capillary: 132 mg/dL — ABNORMAL HIGH (ref 70–99)
Glucose-Capillary: 184 mg/dL — ABNORMAL HIGH (ref 70–99)

## 2018-11-06 MED ORDER — DEXAMETHASONE 4 MG PO TABS
4.0000 mg | ORAL_TABLET | Freq: Two times a day (BID) | ORAL | Status: DC
  Administered 2018-11-06 – 2018-11-07 (×3): 4 mg via ORAL
  Filled 2018-11-06 (×3): qty 1

## 2018-11-06 MED ORDER — PANTOPRAZOLE SODIUM 40 MG PO TBEC
40.0000 mg | DELAYED_RELEASE_TABLET | Freq: Every day | ORAL | Status: DC
  Administered 2018-11-06: 40 mg via ORAL
  Filled 2018-11-06: qty 1

## 2018-11-06 NOTE — Progress Notes (Signed)
Physical Therapy Treatment Patient Details Name: Jesus [name] MRN: [id number] DOB: [birth year] Today's Date: 11/06/2018    History of Present Illness 63yo male s/p C3 corpectomy and C2-C4 plating 11/04/18. PMH cervical stenosis, CA, OA, L rotator cuff repair, back surgery    PT Comments    Patient making steady progress towards PT goals. Current POC remains appropriate.   Follow Up Recommendations  Outpatient PT(once cleared by MD/surgeon)     Equipment Recommendations  None recommended by PT    Recommendations for Other Services       Precautions / Restrictions Precautions Precautions: Cervical Precaution Booklet Issued: Yes (comment) Precaution Comments: aspen vista on when OOB, applied in sitting Required Braces or Orthoses: Cervical Brace Cervical Brace: Hard collar Restrictions Weight Bearing Restrictions: No    Mobility  Bed Mobility Overal bed mobility: Needs Assistance Bed Mobility: Rolling;Sidelying to Sit Rolling: Supervision Sidelying to sit: Supervision       General bed mobility comments: good understanding of technique  Transfers Overall transfer level: Needs assistance Equipment used: None Transfers: Sit to/from Stand Sit to Stand: Supervision         General transfer comment: VCs for hand placement and positioning  Ambulation/Gait Ambulation/Gait assistance: Supervision Gait Distance (Feet): 180 Feet Assistive device: Rolling walker (2 wheeled) Gait Pattern/deviations: WFL(Within Functional Limits);Step-through pattern Gait velocity: decreased Gait velocity interpretation: 1.31 - 2.62 ft/sec, indicative of limited community ambulator General Gait Details: Steady with use of RW, decreased overall cadence   Stairs             Wheelchair Mobility    Modified Rankin (Stroke Patients Only)       Balance Overall balance assessment: Mild deficits observed, not formally tested                                           Cognition Arousal/Alertness: Awake/alert Behavior During Therapy: WFL for tasks assessed/performed Overall Cognitive Status: Within Functional Limits for tasks assessed                                        Exercises      General Comments        Pertinent Vitals/Pain Pain Assessment: 0-10 Pain Score: 3  Pain Location: neck with gait Pain Descriptors / Indicators: Aching;Discomfort Pain Intervention(s): Monitored during session    Home Living                      Prior Function            PT Goals (current goals can now be found in the care plan section) Acute Rehab PT Goals Patient Stated Goal: go home soon with less neck pain PT Goal Formulation: With patient Time For Goal Achievement: 11/19/18 Potential to Achieve Goals: Good Progress towards PT goals: Progressing toward goals    Frequency    Min 5X/week      PT Plan Current plan remains appropriate    Co-evaluation              AM-PAC PT "6 Clicks" Mobility   Outcome Measure  Help needed turning from your back to your side while in a flat bed without using bedrails?: None Help needed moving from lying on your back to sitting on the side of  a flat bed without using bedrails?: None Help needed moving to and from a bed to a chair (including a wheelchair)?: None Help needed standing up from a chair using your arms (e.g., wheelchair or bedside chair)?: None Help needed to walk in hospital room?: A Little Help needed climbing 3-5 steps with a railing? : A Little 6 Click Score: 22    End of Session Equipment Utilized During Treatment: Gait belt Activity Tolerance: Patient tolerated treatment well Patient left: in chair;with call bell/phone within reach   PT Visit Diagnosis: Difficulty in walking, not elsewhere classified (R26.2);Other symptoms and signs involving the nervous system (R29.898);Pain Pain - Right/Left: (neck pain) Pain - part of body: (neck pain)      Time: [id number] PT Time Calculation (min) (ACUTE ONLY): 22 min  Charges:  $Gait Training: 8-22 mins                     Alben Deeds, PT DPT  Board Certified Neurologic Specialist [place] Pager [phone] Office [place] 11/06/2018, 11:00 AM

## 2018-11-06 NOTE — Progress Notes (Signed)
Patient ID: Jesus [name], male   DOB: 10-08-1955, 63 y.o.   MRN: [id number] BP (!) 162/102 (BP Location: Right Arm)   Pulse 77   Temp 98.3 F (36.8 C) (Oral)   Resp 20   Ht 5\' 7"  (1.702 m)   Wt 54.9 kg   SpO2 100%   BMI 18.96 kg/m  Alert and oriented x 4, moving all extremities well Wound is clean, dry, no signs of infection Drain removed, will discontinue foley catheter Doing well

## 2018-11-07 ENCOUNTER — Ambulatory Visit: Payer: 59

## 2018-11-07 ENCOUNTER — Inpatient Hospital Stay: Payer: Self-pay | Attending: Hematology

## 2018-11-07 ENCOUNTER — Encounter (HOSPITAL_COMMUNITY): Payer: Self-pay | Admitting: Neurosurgery

## 2018-11-07 DIAGNOSIS — Z5112 Encounter for antineoplastic immunotherapy: Secondary | ICD-10-CM | POA: Insufficient documentation

## 2018-11-07 DIAGNOSIS — Z9484 Stem cells transplant status: Secondary | ICD-10-CM | POA: Insufficient documentation

## 2018-11-07 DIAGNOSIS — M549 Dorsalgia, unspecified: Secondary | ICD-10-CM | POA: Insufficient documentation

## 2018-11-07 DIAGNOSIS — G8929 Other chronic pain: Secondary | ICD-10-CM | POA: Insufficient documentation

## 2018-11-07 DIAGNOSIS — C9000 Multiple myeloma not having achieved remission: Secondary | ICD-10-CM | POA: Insufficient documentation

## 2018-11-07 DIAGNOSIS — M8448XA Pathological fracture, other site, initial encounter for fracture: Secondary | ICD-10-CM | POA: Insufficient documentation

## 2018-11-07 LAB — GLUCOSE, CAPILLARY: Glucose-Capillary: 104 mg/dL — ABNORMAL HIGH (ref 70–99)

## 2018-11-07 MED ORDER — METHOCARBAMOL 500 MG PO TABS: 500.0000 mg | ORAL_TABLET | Freq: Four times a day (QID) | ORAL | 0 refills | Status: DC | PRN

## 2018-11-07 MED ORDER — OXYCODONE HCL 5 MG PO TABS: 5.0000 mg | ORAL_TABLET | ORAL | 0 refills | Status: DC | PRN

## 2018-11-07 MED ORDER — MORPHINE SULFATE ER 15 MG PO TBCR: 15.0000 mg | EXTENDED_RELEASE_TABLET | Freq: Two times a day (BID) | ORAL | 0 refills | Status: DC

## 2018-11-07 MED ORDER — GABAPENTIN 300 MG PO CAPS: 300.0000 mg | ORAL_CAPSULE | Freq: Three times a day (TID) | ORAL | 2 refills | Status: DC

## 2018-11-07 MED FILL — Gelatin Absorbable MT Powder: OROMUCOSAL | Qty: 1 | Status: AC

## 2018-11-07 MED FILL — Thrombin (Recombinant) For Soln 5000 Unit: CUTANEOUS | Qty: 5000 | Status: AC

## 2018-11-07 NOTE — Discharge Summary (Signed)
Physician Discharge Summary  Patient ID: Jesus [name] MRN: [id number] DOB/AGE: [birth year] 63 y.o.  Admit date: 11/04/2018 Discharge date: 11/07/2018  Admission Diagnoses:Multiple myeloma, compression fracture C 3, Cervical stenosis of spinal canal, cervicalgia    Discharge Diagnoses: Multiple myeloma, compression fracture C 3, Cervical stenosis of spinal canal, cervicalgia s/p Cervical three Corpectomy with Cervical two to Cervical four Plating (N/A) with PEEK cage, DBM     Active Problems:   Multiple myeloma [facility])   Discharged Condition: good  Hospital Course: Jesus [name] was admitted for surgery with dx multiple myeloma, C3 compression fracture and cervical stenosis. Following uncomplicated C3 corpectomy, he recovered well and transferred to [facility] for nursing care and therapies. He is mobilizing well with good pain control.  Consults: None  Significant Diagnostic Studies: radiology: X-Ray: intra-op  Treatments: surgery: Cervical three Corpectomy with Cervical two to Cervical four Plating (N/A) with PEEK cage, DBM    Discharge Exam: Blood pressure (!) 152/84, pulse 74, temperature 98.5 F (36.9 C), temperature source Oral, resp. rate 20, height '5\' 7"'  (1.702 m), weight 54.9 kg, SpO2 100 %. Alert, conversant, eating breakfast. Reports no pain at present, noting meds control neck pain well. Incision flat without erythema or drainage. Bandaid at drain site. Good strength all extremities.    Disposition: Discharge disposition: 01-Home or Self Care  Stop Decadron. Rx's for Oxycodone 57m, MS Contin 123m Robaxin 50028mand Gabapentin 300m66mll be sent electronically to pts pharmacy for home use. Pt will f/u in 2-3 weeks with Dr. MoodLisbeth Renshawn 3-4 weeks with Dr. SterVertell Limbersta Collar in use.     Allergies as of 11/07/2018      Reactions   Penicillins Tinitus   Did it involve swelling of the face/tongue/throat, SOB, or low BP? No Did it involve sudden or  severe rash/hives, skin peeling, or any reaction on the inside of your mouth or nose? No Did you need to seek medical attention at a hospital or doctor's office? Yes When did it last happen? More than 30 years ago If all above answers are "NO", may proceed with cephalosporin use.      Medication List    TAKE these medications   acetaminophen 325 MG tablet Commonly known as: TYLENOL Take 325-650 mg by mouth every 6 (six) hours as needed (pain.).   acyclovir 400 MG tablet Commonly known as: ZOVIRAX Take 1 tablet (400 mg total) by mouth 2 (two) times daily.   aspirin 81 MG EC tablet Take 81 mg by mouth daily.   Calcium 600+D3 600-800 MG-UNIT Tabs Generic drug: Calcium Carb-Cholecalciferol Take 1 tablet by mouth daily.   dexamethasone 4 MG tablet Commonly known as: DECADRON Take 5 tablets (20mg59mth breakfast the day after each dose of Daratumumab What changed:   how much to take  how to take this  when to take this   dronabinol 10 MG capsule Commonly known as: MARINOL TAKE 1 CAPSULE BY MOUTH TWICE DAILY BEFORE MEAL(S) What changed: See the new instructions.   ergocalciferol 1.25 MG (50000 UT) capsule Commonly known as: VITAMIN D2 Take 1 capsule (50,000 Units total) by mouth 2 (two) times a week.   gabapentin 300 MG capsule Commonly known as: Neurontin Take 1 capsule (300 mg total) by mouth 3 (three) times daily.   lidocaine-prilocaine cream Commonly known as: EMLA Apply to affected area once What changed:   how much to take  how to take this  when to take this  reasons to take this  additional instructions   methocarbamol 500 MG tablet Commonly known as: ROBAXIN Take 1 tablet (500 mg total) by mouth every 6 (six) hours as needed for muscle spasms.   morphine 15 MG 12 hr tablet Commonly known as: MS CONTIN Take 1 tablet (15 mg total) by mouth every 12 (twelve) hours.   ondansetron 4 MG tablet Commonly known as: Zofran Take 1 tablet (4 mg total)  by mouth every 8 (eight) hours as needed for nausea or vomiting.   ondansetron 8 MG tablet Commonly known as: Zofran Take 1 tablet (8 mg total) by mouth 2 (two) times daily as needed (Nausea or vomiting).   oxyCODONE 5 MG immediate release tablet Commonly known as: Oxy IR/ROXICODONE Take 1 tablet (5 mg total) by mouth every 4 (four) hours as needed for severe pain. What changed: Another medication with the same name was added. Make sure you understand how and when to take each.   oxyCODONE 5 MG immediate release tablet Commonly known as: Oxy IR/ROXICODONE Take 1 tablet (5 mg total) by mouth every 3 (three) hours as needed for moderate pain ((score 4 to 6)). What changed: You were already taking a medication with the same name, and this prescription was added. Make sure you understand how and when to take each.   Pomalyst 2 MG capsule Generic drug: pomalidomide TAKE 1 CAPSULE BY MOUTH  DAILY FOR 21 DAYS, THEN 7  DAYS OFF TAKE WITH WATER What changed: See the new instructions.   prochlorperazine 10 MG tablet Commonly known as: COMPAZINE Take 1 tablet (10 mg total) by mouth every 6 (six) hours as needed (Nausea or vomiting).   senna-docusate 8.6-50 MG tablet Commonly known as: Senokot-S Take 1 tablet by mouth daily.   sucralfate 1 g tablet Commonly known as: Carafate Take 1 tablet (1 g total) by mouth 4 (four) times daily.        Signed: Peggyann Shoals, MD 11/07/2018, 8:45 AM

## 2018-11-07 NOTE — Progress Notes (Addendum)
Subjective: Patient reports "I feel pretty good"  Objective: Vital signs in last 24 hours: Temp:  [98 F (36.7 C)-98.5 F (36.9 C)] 98.5 F (36.9 C) (10/05 0743) Pulse Rate:  [74-86] 74 (10/05 0743) Resp:  [17-20] 20 (10/05 0743) BP: (124-162)/(72-102) 152/84 (10/05 0743) SpO2:  [99 %-100 %] 100 % (10/05 0743)  Intake/Output from previous day: 10/04 0701 - 10/05 0700 In: 920.1 [P.O.:840; I.V.:80.1] Out: 950 [Urine:950] Intake/Output this shift: No intake/output data recorded.  Alert, conversant, eating breakfast. Reports no pain at present, noting meds control neck pain well. Incision flat without erythema or drainage. Bandaid at drain site. Good strength all extremities.   Lab Results: No results for input(s): WBC, HGB, HCT, PLT in the last 72 hours. BMET No results for input(s): NA, K, CL, CO2, GLUCOSE, BUN, CREATININE, CALCIUM in the last 72 hours.  Studies/Results: No results found.  Assessment/Plan: Improved  LOS: 3 days  Per Dr. Vertell Limber, ok to d/c to home. Stop Decadron. Rx's for Oxycodone 5mg , MS Contin 15mg , Robaxin 500mg , and Gabapentin 300mg  will be sent electronically to pts pharmacy for home use. Pt will f/u in 2-3 weeks with Dr. Lisbeth Renshaw & in 3-4 weeks with Dr. Vertell Limber. Vista Collar in use.    Verdis Prime 11/07/2018, 8:03 AM  Patient is doing well.  Discharge home.

## 2018-11-08 ENCOUNTER — Other Ambulatory Visit: Payer: Self-pay | Admitting: Hematology

## 2018-11-08 DIAGNOSIS — Z7189 Other specified counseling: Secondary | ICD-10-CM

## 2018-11-08 DIAGNOSIS — C9000 Multiple myeloma not having achieved remission: Secondary | ICD-10-CM

## 2018-11-08 LAB — TYPE AND SCREEN
ABO/RH(D): A POS
Antibody Screen: POSITIVE
Unit division: 0
Unit division: 0

## 2018-11-08 LAB — BPAM RBC
Blood Product Expiration Date: 202010312359
Blood Product Expiration Date: 202011072359
ISSUE DATE / TIME: 202010021518
ISSUE DATE / TIME: 202010021518
Unit Type and Rh: 6200
Unit Type and Rh: 6200

## 2018-11-10 NOTE — Progress Notes (Signed)
HEMATOLOGY/ONCOLOGY CLINIC NOTE  Date of Service: 11/10/2018  Patient Care Team: Patient, No Pcp Per as PCP - General (General Practice)  CHIEF COMPLAINTS/PURPOSE OF CONSULTATION:   Continued management of Multiple Myeloma  Oncologic History:   Jesus [name] was diagnosed with multiple myeloma in July 2018 in [place]. He initially developed mid back pain in May 2018 and was seen to have a T10 vertebral body mass with additional lytic lesion at T9. His initial M spike in July 2018 was 1.5g with IgG Lambda specificity and IgG elevated at 2284m. His July 2018 BM Bx revealed 20% monoclonal plasma cells and FISH revealed a 17p deletion. He received 4 fractions of palliative RT, however his back pain worsened and developed bilateral lower extremity numbness and tingling, and was then seen to have a T10 pathologic fracture with cord compression s/p T10 corpectomy. The pt began VRd on 09/24/16, completed 7 cycles, [facility], post transplant complicated by neutropenic colitis and deconditioning. Repeat BM Bx on 04/21/17 revealed residual plasma cell with less than 5% lambda light chain restricted plasma cells, and a post-transplant M spike of 0.4g. The pt then began maintenance 557mm2 Carfilzomib every 2 weeks on 06/16/17.  HISTORY OF PRESENTING ILLNESS:   Jesus [name] a wonderful [age].o. male who has been referred to [place] Dr. BePhil Doppor evaluation and management of Multiple Myeloma. The pt reports that he is doing well overall.  The [place], the pt has moved back to his home which is here locally, and is transferring his care here now as well. The pt notes that he is anticipating C11D1 maintenance Carfilzomib today. The pt notes that his most recent M  spike was 0.2g. His last BM biopsy was March 2019, as noted above. He denies any infection issues in the last year. The pt notes that he has received all of is post-transplant vaccinations. [facility]  The pt reports that he continues to have back pain. He has been receiving Zometa every 4 weeks, but was recently transitioned to every 3 months, he denies dental concerns at this time. He is taking Calcium and Vitamin D replacement.   The pt has been taking Marinol for appetite stimulation.   The pt notes that he has an enlarged prostate, which was seen on imaging. He notes that he was expecting to receive a PSA test soon.  The pt notes that prior to his Multiple Myeloma diagnosis in July 2018, he had a "clean bill of health." He denies DM, HTN, lung problems, heart problems, thyroid problems or other medical concerns.  Most recent lab results (03/23/18) of CBC is as follows: all values are WNL except for RBC at 4.03, HGB at 11.5, HCT at 35.6%, MPV at 8.9, Glucose at 135, Total Protein at 6.2.  On review of systems, pt reports chronic back pain, stable energy levels, and denies concerns for infections, new pain along the spine, abdominal pains, leg swelling, and any other symptoms.   On PMHx the pt reports Multiple Myeloma, denies HTN, HLD, lung problems, strokes, seizures, heart problems and thyroid problems. On Social Hx the pt reports working for the [employer] Interval History:  Jesus [name] returns today for management and evaluation of his multiple myeloma not in remission. The patient's last visit with [place] was on 10/21/2018. The pt reports that he is doing well overall.  The pt reports that he is doing okay and things are coming along following he surgery. He is still on a soft diet. He states that the surgery has alleviated the pain in his neck. He has not had any leg swelling.  Of note since the patient's last visit,  pt has had DG Cervical Spine 1 View completed on 11/04/2018 with results revealing "Interbody fusion is noted at C2-3 and C3-4 with anterior fixation extending from C2-C4."  Of note since the patient's last visit, pt has had MR Cervical Spine W WO Contrast completed on 10/26/2018 with results revealing "1. History of multiple myeloma with large deposit in the C3 body and left posterior elements with extraosseous tumor encasing the right vertebral artery, infiltrating the right C2-3 and C3-4 foramina. Pathologic C3 compression fracture with epidural tumor and retropulsion deforming the ventral cord. 2. Smaller lesion in the T2 left posterior elements."  Of note since the patient's last visit, pt has had  Multiple myeloma, compression fracture C 3, Cervical stenosis of spinal canal, cervicalgiacompleted on 11/04/2018  Lab results today (11/11/18) of CBC w/diff and CMP is as follows: all values are WNL except for WBC at 3.6, RBC at 3.05, Hemoglobin at 9.5, HCT at 28.8, RDW at 15.6, Platelets at 133, Neutro As at 1.3, Monocytes Absolute at 1.2, Glucose, Bld at 112, Total Protein at 6.0, and Albumin at 3.3 .  On review of systems, pt reports doing better  and denies hip pain, abdominal pain and any other symptoms.    MEDICAL HISTORY:  Past Medical History:  Diagnosis Date   Arthritis    Cancer ([place])    multiple myeloma   Cervical stenosis of spinal canal    Wears glasses     SURGICAL HISTORY: Past Surgical History:  Procedure Laterality Date   ANTERIOR CERVICAL CORPECTOMY N/A 11/04/2018   Procedure: Cervical three Corpectomy with Cervical two to Cervical four Plating;  Surgeon: Erline Levine, MD;  Location: [place];  Service: Neurosurgery;  Laterality: N/A;   APPENDECTOMY     BACK SURGERY     EYE SURGERY Bilateral    cataract surgery with lens implants   HERNIA REPAIR Left    inguinal    IR IMAGING GUIDED PORT INSERTION  06/21/2018   ROTATOR CUFF REPAIR Left     SOCIAL  HISTORY: Social History   Socioeconomic History   Marital status: Married    Spouse name: Not on file   Number of children: Not on file   Years of education: Not on file   Highest education level: Not on file  Occupational History   Not on file  Social Needs   Financial resource strain: Not on file   Food insecurity    Worry: Not on file    Inability: Not on file   Transportation needs    Medical: No    Non-medical: No  Tobacco Use   Smoking status: Never Smoker   Smokeless tobacco: Never Used  Substance and Sexual Activity   Alcohol use: Never    Frequency: Never   Drug use: Never   Sexual activity: Not on file  Lifestyle   Physical activity    Days per week: Not on file    Minutes per session: Not on file   Stress: Not on  file  Relationships   Social connections    Talks on phone: Not on file    Gets together: Not on file    Attends religious service: Not on file    Active member of club or organization: Not on file    Attends meetings of clubs or organizations: Not on file    Relationship status: Not on file   Intimate partner violence    Fear of current or ex partner: Not on file    Emotionally abused: Not on file    Physically abused: Not on file    Forced sexual activity: Not on file  Other Topics Concern   Not on file  Social History Narrative   Not on file    FAMILY HISTORY: Family History  Problem Relation Age of Onset   Breast cancer Sister     ALLERGIES:  is allergic to penicillins.  MEDICATIONS:  Current Outpatient Medications  Medication Sig Dispense Refill   acetaminophen (TYLENOL) 325 MG tablet Take 325-650 mg by mouth every 6 (six) hours as needed (pain.).      acyclovir (ZOVIRAX) 400 MG tablet Take 1 tablet (400 mg total) by mouth 2 (two) times daily. 60 tablet 2   aspirin 81 MG EC tablet Take 81 mg by mouth daily.      Calcium Carb-Cholecalciferol (CALCIUM 600+D3) 600-800 MG-UNIT TABS Take 1 tablet by mouth  daily.     dexamethasone (DECADRON) 4 MG tablet Take 5 tablets (46m) with breakfast the day after each dose of Daratumumab (Patient taking differently: Take 20 mg by mouth See admin instructions. Take 5 tablets (234m with breakfast the day after each dose of Daratumumab) 120 tablet 1   dronabinol (MARINOL) 10 MG capsule TAKE 1 CAPSULE BY MOUTH TWICE DAILY BEFORE MEAL(S) (Patient taking differently: Take 10 mg by mouth 2 (two) times daily. ) 60 capsule 0   ergocalciferol (VITAMIN D2) 1.25 MG (50000 UT) capsule Take 1 capsule (50,000 Units total) by mouth 2 (two) times a week. (Patient not taking: Reported on 10/27/2018) 24 capsule 6   gabapentin (NEURONTIN) 300 MG capsule Take 1 capsule (300 mg total) by mouth 3 (three) times daily. 90 capsule 2   lidocaine-prilocaine (EMLA) cream Apply to affected area once (Patient taking differently: Apply 1 application topically as needed (applied to port prior to being accessed.). ) 30 g 3   methocarbamol (ROBAXIN) 500 MG tablet Take 1 tablet (500 mg total) by mouth every 6 (six) hours as needed for muscle spasms. 60 tablet 0   morphine (MS CONTIN) 15 MG 12 hr tablet Take 1 tablet (15 mg total) by mouth every 12 (twelve) hours. 60 tablet 0   ondansetron (ZOFRAN) 4 MG tablet Take 1 tablet (4 mg total) by mouth every 8 (eight) hours as needed for nausea or vomiting. (Patient not taking: Reported on 10/27/2018) 30 tablet 0   ondansetron (ZOFRAN) 8 MG tablet Take 1 tablet (8 mg total) by mouth 2 (two) times daily as needed (Nausea or vomiting). (Patient not taking: Reported on 10/27/2018) 30 tablet 1   oxyCODONE (OXY IR/ROXICODONE) 5 MG immediate release tablet Take 1 tablet (5 mg total) by mouth every 4 (four) hours as needed for severe pain. 120 tablet 0   oxyCODONE (OXY IR/ROXICODONE) 5 MG immediate release tablet Take 1 tablet (5 mg total) by mouth every 3 (three) hours as needed for moderate pain ((score 4 to 6)). 30 tablet 0   POMALYST 2 MG capsule TAKE  1 CAPSULE BY MOUTH  DAILY FOR 21 DAYS, THEN 7  DAYS OFF TAKE WITH WATER (Patient taking differently: Take 2 mg by mouth See admin instructions. TAKE 1 CAPSULE (2 MG) BY MOUTHDAILY FOR 21 DAYS, THEN 7DAYS OFF) 21 capsule 0   prochlorperazine (COMPAZINE) 10 MG tablet Take 1 tablet (10 mg total) by mouth every 6 (six) hours as needed (Nausea or vomiting). (Patient not taking: Reported on 10/27/2018) 30 tablet 1   senna-docusate (SENOKOT-S) 8.6-50 MG tablet Take 1 tablet by mouth daily.      sucralfate (CARAFATE) 1 g tablet Take 1 tablet (1 g total) by mouth 4 (four) times daily. (Patient not taking: Reported on 10/27/2018) 120 tablet 0   No current facility-administered medications for this visit.     REVIEW OF SYSTEMS:    A 10+ POINT REVIEW OF SYSTEMS WAS OBTAINED including neurology, dermatology, psychiatry, cardiac, respiratory, lymph, extremities, GI, GU, Musculoskeletal, constitutional, breasts, reproductive, HEENT.  All pertinent positives are noted in the HPI.  All others are negative.    PHYSICAL EXAMINATION: ECOG FS:2 - Symptomatic, <50% confined to bed  There were no vitals filed for this visit. Wt Readings from Last 3 Encounters:  11/04/18 121 lb 0.5 oz (54.9 kg)  11/01/18 121 lb (54.9 kg)  10/21/18 122 lb 3.2 oz (55.4 kg)   There is no height or weight on file to calculate BMI.    GENERAL:alert, in no acute distress and comfortable SKIN: no acute rashes, no significant lesions EYES: conjunctiva are pink and non-injected, sclera anicteric OROPHARYNX: MMM, no exudates, no oropharyngeal erythema or ulceration NECK: supple, no JVD LYMPH:  no palpable lymphadenopathy in the cervical, axillary or inguinal regions LUNGS: clear to auscultation b/l with normal respiratory effort HEART: regular rate & rhythm ABDOMEN:  normoactive bowel sounds , non tender, not distended. Extremity: no pedal edema PSYCH: alert & oriented x 3 with fluent speech NEURO: no focal motor/sensory  deficits   LABORATORY DATA:  I have reviewed the data as listed  . CBC Latest Ref Rng & Units 10/21/2018 10/07/2018 09/23/2018  WBC 4.0 - 10.5 K/uL 3.7(L) 2.0(L) 3.8(L)  Hemoglobin 13.0 - 17.0 g/dL 11.3(L) 10.6(L) 10.7(L)  Hematocrit 39.0 - 52.0 % 33.6(L) 32.3(L) 32.2(L)  Platelets 150 - 400 K/uL 130(L) 124(L) 83(L)    . CMP Latest Ref Rng & Units 10/21/2018 10/07/2018 09/23/2018  Glucose 70 - 99 mg/dL 122(H) 91 117(H)  BUN 8 - 23 mg/dL _0 Creatinine 0.61 - 1.24 mg/dL 0.71 0.62 0.73  Sodium 135 - 145 mmol/L 139 138 137  Potassium 3.5 - 5.1 mmol/L 3.8 4.4 3.8  Chloride 98 - 111 mmol/L 102 108 105  CO2 22 - 32 mmol/L 27 21(L) 23  Calcium 8.9 - 10.3 mg/dL 9.5 9.1 8.9  Total Protein 6.5 - 8.1 g/dL 6.5 6.3(L) 6.4(L)  Total Bilirubin 0.3 - 1.2 mg/dL 0.4 0.4 0.6  Alkaline Phos 38 - 126 U/L 74 84 86  AST 15 - 41 U/L 20 12(L) 12(L)  ALT 0 - 44 U/L _1 10/02/2020Cervical three Corpectomy with Cervical two to Cervical four Plating (N/A) with PEEK cage, DBM   11/04/2018 ([id number]) DG Cervical Spine 1 View   10/26/2018 ([id number])DU CERVICAL SPINE W WO CONTRAST  06/30/18 BM Biopsy:    08/22/2018 Bone Marrow Biopsy     RADIOGRAPHIC STUDIES: I have personally reviewed the radiological images as listed and agreed with the findings in the report. Dg Cervical Spine 1 View  Result Date: 11/04/2018 CLINICAL DATA:  Cervical fusion EXAM: DG CERVICAL SPINE - 1 VIEW; DG C-ARM 1-60 MIN COMPARISON:  None. FLUOROSCOPY TIME:  Radiation Exposure Index (as provided by the fluoroscopic device): Not available If the device does not provide the exposure index: Fluoroscopy Time:  19 seconds Number of Acquired Images:  1 FINDINGS: Interbody fusion is noted at C2-3 and C3-4 with anterior fixation extending from C2-C4. IMPRESSION: C2-C4 fusion. Electronically Signed   By: Inez Catalina M.D.   On: 11/04/2018 16:16   Mr Cervical Spine W Wo Contrast  Result Date: 10/26/2018 CLINICAL DATA:   Multiple myeloma.  Surgical evaluation of C3 lesion EXAM: MRI CERVICAL SPINE WITHOUT AND WITH CONTRAST TECHNIQUE: Multiplanar and multiecho pulse sequences of the cervical spine, to include the craniocervical junction and cervicothoracic junction, were obtained without and with intravenous contrast. CONTRAST:  59m GADAVIST GADOBUTROL 1 MMOL/ML IV SOLN COMPARISON:  Head CT FINDINGS: Alignment: Anterior tilting of C2 related to the C3 collapse. Vertebrae: Mass throughout the C3 body extending into the prevertebral space and encircling the right vertebral artery and filling the right C3-4 foramen and C2-3 foramen. There is extension into the right transverse process and articular process of C3. Compression fracture of C3 with chronic appearance; anterior height loss is advanced. Retropulsion and posterior bulging of cortex with superimposed epidural tumor causes mild deformity of the ventral cord. T2 left posterior element deposit without fracture. Cord: Normal signal and morphology Posterior Fossa, vertebral arteries, paraspinal tissues: As above Disc levels: Mild disc narrowing and bulging at C4-5 to C7-T1, with patent canal and foramina. IMPRESSION: 1. History of multiple myeloma with large deposit in the C3 body and left posterior elements with extraosseous tumor encasing the right vertebral artery, infiltrating the right C2-3 and C3-4 foramina. Pathologic C3 compression fracture with epidural tumor and retropulsion deforming the ventral cord. 2. Smaller lesion in the T2 left posterior elements. Electronically Signed   By: JMonte FantasiaM.D.   On: 10/26/2018 07:58   Nm Pet Image Restage (ps) Whole Body  Result Date: 10/13/2018 CLINICAL DATA:  Subsequent treatment strategy for multiple myeloma. EXAM: NUCLEAR MEDICINE PET WHOLE BODY TECHNIQUE: 6.1 mCi F-18 FDG was injected intravenously. Full-ring PET imaging was performed from the skull base to thigh after the radiotracer. CT data was obtained and used for  attenuation correction and anatomic localization. Fasting blood glucose: 90 mg/dl COMPARISON:  PET scan 06/29/2018 FINDINGS: Mediastinal blood pool activity: SUV max 2.23 HEAD/NECK: Bilateral symmetric hypermetabolic activity in the parotid glands is favored benign. Incidental CT findings: none CHEST: No hypermetabolic mediastinal or hilar nodes. No suspicious pulmonary nodules on the CT scan. Incidental CT findings: Port in the anterior chest wall with tip in distal SVC. ABDOMEN/PELVIS: No abnormal hypermetabolic activity within the liver, pancreas, adrenal glands, or spleen. No hypermetabolic lymph nodes in the abdomen or pelvis. Incidental CT findings: Prostate enlarged. SKELETON: Mixed response to therapy. The several lesions are increased in metabolic activity well other skeletal lesions are decreased. Majority lesions are associated with ground-glass densities within the medullary space. For example increase lesions include: RIGHT iliac wing lesion SUV max equal 7.1 increased from 6.0. The size of lesion is increased by PET imaging. Increased metabolic activity in the T5 vertebral body with SUV max equal 5.6. Lesion difficult define on the CT portion exam. Lesion in the C3 vertebral body which partially lytic with SUV max equal 8.6 is similar intensity to SUV max equal 8.9 however the destructive portion lesion on CT exam is considerably increased as well as  the size of lesion involving with metabolic activity. Loss of C3 vertebral body structure best seen on sagittal image. Also seen on axial image 53/4. Lesions which are improved include cluster of metastatic lesions in the RIGHT humeral head and glenoid fossa of the scapula which are much reduced in metabolic activity SUV max equal 3.9 decreased from 8.3. Lesion in the LEFT iliac bone with SUV max equal 11.3 and measuring approximately 3.7 cm is much reduced to SUV max equal 3.3. Large lytic lesion remaining. Incidental CT findings: Large benign fatty  lesion in the muscles of the LEFT anterior thigh without metabolic activity. EXTREMITIES: No evidence of metastatic disease in the lower extremities. Incidental CT findings: none IMPRESSION: 1. Mixed response to therapy with some lesions decreased significantly in metabolic activity and others increased in metabolic activity as well size on the CT portion exam. 2. Expansion of lytic lesion at C3 with loss of a large portion of the vertebral body structure and intense metabolic activity. Recommend non emergent neuro surgical consultation for evaluation of this destructive C3 lesion. Dedicated CT or MRI of the neck may also provide more information. 3. Increase in size and metabolic activity of RIGHT iliac lesion and T5 vertebral body lesion. 4. Marked improvement with reduction in metabolic activity of RIGHT humerus and scapular metastatic lesions as well as marked reduction in metabolic activity large LEFT iliac bone lesion. 5. No evidence of soft tissue plasmacytoma or metastasis. These results will be called to the ordering clinician or representative by the Radiologist Assistant, and communication documented in the PACS or zVision Dashboard. Electronically Signed   By: Suzy Bouchard M.D.   On: 10/13/2018 13:03   Dg C-arm 1-60 Min  Result Date: 11/04/2018 CLINICAL DATA:  Cervical fusion EXAM: DG CERVICAL SPINE - 1 VIEW; DG C-ARM 1-60 MIN COMPARISON:  None. FLUOROSCOPY TIME:  Radiation Exposure Index (as provided by the fluoroscopic device): Not available If the device does not provide the exposure index: Fluoroscopy Time:  19 seconds Number of Acquired Images:  1 FINDINGS: Interbody fusion is noted at C2-3 and C3-4 with anterior fixation extending from C2-C4. IMPRESSION: C2-C4 fusion. Electronically Signed   By: Inez Catalina M.D.   On: 11/04/2018 16:16    ASSESSMENT & PLAN:  63 y.o. male with  1. Multiple Myeloma - high risk with 17p deletion July 2018 BM Bx revealed 20% monoclonal plasma cells July  2018 Cytogenetics revealed a 17p deletion July 2018 Initial M spike at 1.5g with IgG Lambda specificity, K:L ratio of 0.27. IgG at 2220. 08/11/16 PET/CT revealed Hypermetabolic large soft tissue mass in the lower thoracic paraspinal region associated with lytic destruction of T10 vertebral body, concerning for multiple myeloma. Additional smaller lytic lesions involving the skeleton. S/p RT x 4 fractions, discontinued due to T10 pathologic fracture with severe cord compression S/p 08/26/16 T10 corpectomy and T8-L1 posterior spinal fusion   Began 7 cycles of VRd on 09/24/16 S/p autologous stem cell transplant, Day 0 on 02/23/17 04/21/17 BM Bx with residual less than 5% lambda light chain restricted plasma cells in the bone marrow. M Spike at 0.4g Began maintenance '56mg'$ /m2 Carfilzomib every 2 weeks on 06/16/17  06/09/18 PET/CT revealed "Multifocal hypermetabolic lytic lesions in the skeleton as noted above, compatible with active myeloma. In the spine, the most notable active lesion of concern is at the T12 level with there is a large right eccentric vertebral body lesion with demineralization of the cortex and likely some paravertebral extension of tumor. This could  cause loosening of the right pedicle screw. Intraspinal extension of tumor is difficult to exclude. MRI might be considered although the posterolateral rod and pedicle screws may cause artifact. 2. There is likewise cortical breakthrough associated with the lytic lesion along the left upper acetabulum. 3. A lesion in the left intertrochanteric area of the femur is large enough to potentially cause biomechanical weakening which increases risk of fracture. 4. Other skeletal sites of active involvement are detailed above in the skeleton section. 5. Several lucent lesions are observed including the calvarium, T1 vertebral body, and left L4 pedicle which are not hypermetabolic and may represent effectively previously treated lesions. 6. The patient has a  large lipoma anterior to the left hip in between the iloipsoas in the rectus femoris muscles. There is also a lipoma along the right brachialis muscle. 7. Nonspecific subcutaneous edema especially in the right forearm but also extending up into the right upper arm. Cause is uncertain. There is some low-grade nonfocal metabolic activity along this area. Strictly speaking, cellulitis is not excluded, correlate with clinical history and visual inspection. 8. Other imaging findings of potential clinical significance: Aortic Atherosclerosis. Mild cardiomegaly. Cholelithiasis. Prostatomegaly."  06/17/18 MMP revealed M Protein at 0.5g, just before beginning C1 Daratumumab  06/30/18 BM Biopsy revealed normocellular marrow with minimal involvement by plasma cell neoplasm (5% plasma cells), normal cytogenetics.  06/30/18 Molecular Cytogenetics did not have enough material for testing.  10/13/2018 PET whole body revealed "1. Mixed response to therapy with some lesions decreased significantly in metabolic activity and others increased in metabolic activity as well size on the CT portion exam. 2. Expansion of lytic lesion at C3 with loss of a large portion of the vertebral body structure and intense metabolic activity. Recommend non emergent neuro surgical consultation for evaluation of this destructive C3 lesion. Dedicated CT or MRI of the neck may also provide more information. 3. Increase in size and metabolic activity of RIGHT iliac lesion and T5 vertebral body lesion. 4. Marked improvement with reduction in metabolic activity of RIGHT humerus and scapular metastatic lesions as well as marked reduction in metabolic activity large LEFT iliac bone lesion. 5. No evidence of soft tissue plasmacytoma or metastasis."  PLAN: A&P: -Discussed pt labwork today, 11/11/18;  all values are WNL except for WBC at 3.6, RBC at 3.05, Hemoglobin at 9.5, HCT at 28.8, RDW at 15.6, Platelets at 133, Neutro As at 1.3, Monocytes Absolute  at 1.2, Glucose, Bld at 112, Total Protein at 6.0, and Albumin at 3.3 . -Advised that he has the most aggressive Multiple Myeloma and the the treatments are curative, they are to control the disease. - next treatments hold treatment for couple weeks. No treatment today. Wait for bone directed therapy for four weeks post surgery.  -Advised pt to follow up with Dr.Rodgegeze to determine further treatment and if there are clinical trails available.  -Advised that his form for myeloma would not respond to chemotherapy. -Discussed that the spot on his spin is recommended to be treated with radiation  FOLLOW UP: Hold Zometa till 12/2018 Continue Daratumumab q2weeks with labs- plz schedule next 2 months MD visit with every other treatment-- does not need MD visit on 10/16     The total time spent in the appt was 25 minutes and more than 50% was on counseling and direct patient cares.  All of the patient's questions were answered with apparent satisfaction. The patient knows to call the clinic with any problems, questions or concerns.   Terrisha Lopata  Irene Limbo MD Ivor AAHIVMS [facility] Hematology/Oncology Physician [facility]  (Office):       [phone] (Work cell):  [phone] (Fax):           [phone]  11/10/2018 9:04 PM  I, Scot Dock, am acting as a scribe for Dr. Sullivan Lone.   .I have reviewed the above documentation for accuracy and completeness, and I agree with the above. Brunetta Genera MD

## 2018-11-11 ENCOUNTER — Other Ambulatory Visit: Payer: Self-pay

## 2018-11-11 ENCOUNTER — Inpatient Hospital Stay: Payer: Self-pay

## 2018-11-11 ENCOUNTER — Inpatient Hospital Stay (HOSPITAL_BASED_OUTPATIENT_CLINIC_OR_DEPARTMENT_OTHER): Payer: 59 | Admitting: Hematology

## 2018-11-11 VITALS — BP 137/83 | HR 88 | Temp 98.5°F | Resp 18 | Ht 67.0 in | Wt 123.9 lb

## 2018-11-11 DIAGNOSIS — C9000 Multiple myeloma not having achieved remission: Secondary | ICD-10-CM

## 2018-11-11 DIAGNOSIS — Z7189 Other specified counseling: Secondary | ICD-10-CM

## 2018-11-11 DIAGNOSIS — Z95828 Presence of other vascular implants and grafts: Secondary | ICD-10-CM

## 2018-11-11 LAB — CBC WITH DIFFERENTIAL/PLATELET
Abs Immature Granulocytes: 0.01 10*3/uL (ref 0.00–0.07)
Basophils Absolute: 0 10*3/uL (ref 0.0–0.1)
Basophils Relative: 0 %
Eosinophils Absolute: 0 10*3/uL (ref 0.0–0.5)
Eosinophils Relative: 1 %
HCT: 28.8 % — ABNORMAL LOW (ref 39.0–52.0)
Hemoglobin: 9.5 g/dL — ABNORMAL LOW (ref 13.0–17.0)
Immature Granulocytes: 0 %
Lymphocytes Relative: 29 %
Lymphs Abs: 1 10*3/uL (ref 0.7–4.0)
MCH: 31.1 pg (ref 26.0–34.0)
MCHC: 33 g/dL (ref 30.0–36.0)
MCV: 94.4 fL (ref 80.0–100.0)
Monocytes Absolute: 1.2 10*3/uL — ABNORMAL HIGH (ref 0.1–1.0)
Monocytes Relative: 33 %
Neutro Abs: 1.3 10*3/uL — ABNORMAL LOW (ref 1.7–7.7)
Neutrophils Relative %: 37 %
Platelets: 133 10*3/uL — ABNORMAL LOW (ref 150–400)
RBC: 3.05 MIL/uL — ABNORMAL LOW (ref 4.22–5.81)
RDW: 15.6 % — ABNORMAL HIGH (ref 11.5–15.5)
WBC: 3.6 10*3/uL — ABNORMAL LOW (ref 4.0–10.5)
nRBC: 0 % (ref 0.0–0.2)

## 2018-11-11 LAB — CMP (CANCER CENTER ONLY)
ALT: 25 U/L (ref 0–44)
AST: 15 U/L (ref 15–41)
Albumin: 3.3 g/dL — ABNORMAL LOW (ref 3.5–5.0)
Alkaline Phosphatase: 109 U/L (ref 38–126)
Anion gap: 9 (ref 5–15)
BUN: 9 mg/dL (ref 8–23)
CO2: 27 mmol/L (ref 22–32)
Calcium: 8.9 mg/dL (ref 8.9–10.3)
Chloride: 103 mmol/L (ref 98–111)
Creatinine: 0.65 mg/dL (ref 0.61–1.24)
GFR, Est AFR Am: >60 mL/min (ref >60)
GFR, Estimated: >60 mL/min (ref >60)
Glucose, Bld: 112 mg/dL — ABNORMAL HIGH (ref 70–99)
Potassium: 4 mmol/L (ref 3.5–5.1)
Sodium: 139 mmol/L (ref 135–145)
Total Bilirubin: 0.4 mg/dL (ref 0.3–1.2)
Total Protein: 6 g/dL — ABNORMAL LOW (ref 6.5–8.1)

## 2018-11-11 LAB — SURGICAL PATHOLOGY

## 2018-11-11 MED ORDER — HEPARIN SOD (PORK) LOCK FLUSH 100 UNIT/ML IV SOLN
250.0000 [IU] | Freq: Once | INTRAVENOUS | Status: AC | PRN
  Administered 2018-11-11: 250 [IU]
  Filled 2018-11-11: qty 5

## 2018-11-11 MED ORDER — SODIUM CHLORIDE 0.9% FLUSH
10.0000 mL | Freq: Once | INTRAVENOUS | Status: AC
  Administered 2018-11-11: 10 mL
  Filled 2018-11-11: qty 10

## 2018-11-12 ENCOUNTER — Telehealth: Payer: Self-pay | Admitting: Hematology

## 2018-11-12 NOTE — Telephone Encounter (Signed)
Scheduled appt per 10/9 los.

## 2018-11-17 ENCOUNTER — Other Ambulatory Visit: Payer: Self-pay

## 2018-11-17 ENCOUNTER — Ambulatory Visit
Admission: RE | Admit: 2018-11-17 | Discharge: 2018-11-17 | Disposition: A | Discharge status: Home / Self Care | Payer: 59 | Source: Ambulatory Visit | Attending: Radiation Oncology | Admitting: Radiation Oncology

## 2018-11-17 ENCOUNTER — Encounter: Payer: Self-pay | Admitting: Radiation Oncology

## 2018-11-17 VITALS — BP 131/86 | HR 91 | Temp 98.3°F | Resp 18 | Wt 120.0 lb

## 2018-11-17 DIAGNOSIS — C9000 Multiple myeloma not having achieved remission: Secondary | ICD-10-CM

## 2018-11-17 DIAGNOSIS — Z79899 Other long term (current) drug therapy: Secondary | ICD-10-CM | POA: Insufficient documentation

## 2018-11-17 DIAGNOSIS — G893 Neoplasm related pain (acute) (chronic): Secondary | ICD-10-CM

## 2018-11-17 DIAGNOSIS — Z510 Encounter for antineoplastic radiation therapy: Secondary | ICD-10-CM | POA: Insufficient documentation

## 2018-11-17 DIAGNOSIS — M1990 Unspecified osteoarthritis, unspecified site: Secondary | ICD-10-CM | POA: Insufficient documentation

## 2018-11-17 DIAGNOSIS — M899 Disorder of bone, unspecified: Secondary | ICD-10-CM

## 2018-11-17 DIAGNOSIS — Z803 Family history of malignant neoplasm of breast: Secondary | ICD-10-CM | POA: Insufficient documentation

## 2018-11-18 ENCOUNTER — Inpatient Hospital Stay: Payer: Self-pay

## 2018-11-18 ENCOUNTER — Other Ambulatory Visit: Payer: Self-pay

## 2018-11-18 ENCOUNTER — Other Ambulatory Visit: Payer: Self-pay | Admitting: Hematology

## 2018-11-18 ENCOUNTER — Ambulatory Visit: Payer: 59 | Admitting: Hematology

## 2018-11-18 VITALS — BP 127/72 | HR 88 | Temp 98.1°F | Resp 16 | Wt 120.0 lb

## 2018-11-18 DIAGNOSIS — C9000 Multiple myeloma not having achieved remission: Secondary | ICD-10-CM

## 2018-11-18 DIAGNOSIS — Z7189 Other specified counseling: Secondary | ICD-10-CM

## 2018-11-18 DIAGNOSIS — D492 Neoplasm of unspecified behavior of bone, soft tissue, and skin: Secondary | ICD-10-CM

## 2018-11-18 DIAGNOSIS — Z95828 Presence of other vascular implants and grafts: Secondary | ICD-10-CM

## 2018-11-18 LAB — CMP (CANCER CENTER ONLY)
ALT: 17 U/L (ref 0–44)
AST: 13 U/L — ABNORMAL LOW (ref 15–41)
Albumin: 3.8 g/dL (ref 3.5–5.0)
Alkaline Phosphatase: 102 U/L (ref 38–126)
Anion gap: 10 (ref 5–15)
BUN: 13 mg/dL (ref 8–23)
CO2: 27 mmol/L (ref 22–32)
Calcium: 9.7 mg/dL (ref 8.9–10.3)
Chloride: 104 mmol/L (ref 98–111)
Creatinine: 0.69 mg/dL (ref 0.61–1.24)
GFR, Est AFR Am: >60 mL/min (ref >60)
GFR, Estimated: >60 mL/min (ref >60)
Glucose, Bld: 97 mg/dL (ref 70–99)
Potassium: 3.8 mmol/L (ref 3.5–5.1)
Sodium: 141 mmol/L (ref 135–145)
Total Bilirubin: 0.3 mg/dL (ref 0.3–1.2)
Total Protein: 6.9 g/dL (ref 6.5–8.1)

## 2018-11-18 LAB — CBC WITH DIFFERENTIAL/PLATELET
Abs Immature Granulocytes: 0.01 10*3/uL (ref 0.00–0.07)
Basophils Absolute: 0 10*3/uL (ref 0.0–0.1)
Basophils Relative: 0 %
Eosinophils Absolute: 0 10*3/uL (ref 0.0–0.5)
Eosinophils Relative: 0 %
HCT: 32 % — ABNORMAL LOW (ref 39.0–52.0)
Hemoglobin: 10.5 g/dL — ABNORMAL LOW (ref 13.0–17.0)
Immature Granulocytes: 0 %
Lymphocytes Relative: 26 %
Lymphs Abs: 1.2 10*3/uL (ref 0.7–4.0)
MCH: 30.8 pg (ref 26.0–34.0)
MCHC: 32.8 g/dL (ref 30.0–36.0)
MCV: 93.8 fL (ref 80.0–100.0)
Monocytes Absolute: 0.6 10*3/uL (ref 0.1–1.0)
Monocytes Relative: 14 %
Neutro Abs: 2.6 10*3/uL (ref 1.7–7.7)
Neutrophils Relative %: 60 %
Platelets: 153 10*3/uL (ref 150–400)
RBC: 3.41 MIL/uL — ABNORMAL LOW (ref 4.22–5.81)
RDW: 15.5 % (ref 11.5–15.5)
WBC: 4.5 10*3/uL (ref 4.0–10.5)
nRBC: 0 % (ref 0.0–0.2)

## 2018-11-18 MED ORDER — OXYCODONE-ACETAMINOPHEN 5-325 MG PO TABS
1.0000 | ORAL_TABLET | Freq: Once | ORAL | Status: AC | PRN
  Administered 2018-11-18: 1 via ORAL

## 2018-11-18 MED ORDER — ACETAMINOPHEN 325 MG PO TABS
650.0000 mg | ORAL_TABLET | Freq: Once | ORAL | Status: AC
  Administered 2018-11-18: 650 mg via ORAL

## 2018-11-18 MED ORDER — OXYCODONE HCL 5 MG PO TABS: 5.0000 mg | ORAL_TABLET | ORAL | 0 refills | Status: DC | PRN

## 2018-11-18 MED ORDER — HEPARIN SOD (PORK) LOCK FLUSH 100 UNIT/ML IV SOLN
500.0000 [IU] | Freq: Once | INTRAVENOUS | Status: AC | PRN
  Administered 2018-11-18: 500 [IU]
  Filled 2018-11-18: qty 5

## 2018-11-18 MED ORDER — FAMOTIDINE IN NACL 20-0.9 MG/50ML-% IV SOLN
INTRAVENOUS | Status: AC
  Filled 2018-11-18: qty 50

## 2018-11-18 MED ORDER — SODIUM CHLORIDE 0.9% FLUSH
10.0000 mL | INTRAVENOUS | Status: DC | PRN
  Administered 2018-11-18: 10 mL
  Filled 2018-11-18: qty 10

## 2018-11-18 MED ORDER — SODIUM CHLORIDE 0.9 % IV SOLN
Freq: Once | INTRAVENOUS | Status: AC
  Administered 2018-11-18: 11:00:00 via INTRAVENOUS
  Filled 2018-11-18: qty 250

## 2018-11-18 MED ORDER — DIPHENHYDRAMINE HCL 25 MG PO CAPS
50.0000 mg | ORAL_CAPSULE | Freq: Once | ORAL | Status: AC
  Administered 2018-11-18: 50 mg via ORAL

## 2018-11-18 MED ORDER — OXYCODONE-ACETAMINOPHEN 5-325 MG PO TABS
ORAL_TABLET | ORAL | Status: AC
  Filled 2018-11-18: qty 1

## 2018-11-18 MED ORDER — OXYCODONE-ACETAMINOPHEN 5-325 MG PO TABS: 1.0000 | ORAL_TABLET | Freq: Once | ORAL | Status: DC | PRN

## 2018-11-18 MED ORDER — SODIUM CHLORIDE 0.9% FLUSH
10.0000 mL | Freq: Once | INTRAVENOUS | Status: AC
  Administered 2018-11-18: 10:00:00 10 mL
  Filled 2018-11-18: qty 10

## 2018-11-18 MED ORDER — FAMOTIDINE IN NACL 20-0.9 MG/50ML-% IV SOLN
20.0000 mg | Freq: Once | INTRAVENOUS | Status: AC
  Administered 2018-11-18: 20 mg via INTRAVENOUS

## 2018-11-18 MED ORDER — ACETAMINOPHEN 325 MG PO TABS
ORAL_TABLET | ORAL | Status: AC
  Filled 2018-11-18: qty 2

## 2018-11-18 MED ORDER — DIPHENHYDRAMINE HCL 25 MG PO CAPS
ORAL_CAPSULE | ORAL | Status: AC
  Filled 2018-11-18: qty 1

## 2018-11-18 MED ORDER — SODIUM CHLORIDE 0.9 % IV SOLN
15.6000 mg/kg | Freq: Once | INTRAVENOUS | Status: AC
  Administered 2018-11-18: 900 mg via INTRAVENOUS
  Filled 2018-11-18: qty 40

## 2018-11-18 MED ORDER — SODIUM CHLORIDE 0.9 % IV SOLN
20.0000 mg | Freq: Once | INTRAVENOUS | Status: AC
  Administered 2018-11-18: 20 mg via INTRAVENOUS
  Filled 2018-11-18: qty 20

## 2018-11-18 NOTE — Patient Instructions (Signed)
[facility] Discharge Instructions for Patients Receiving Chemotherapy  Today you received the following chemotherapy agents: Darzalex  To help prevent nausea and vomiting after your treatment, we encourage you to take your nausea medication as directed.    If you develop nausea and vomiting that is not controlled by your nausea medication, call the clinic.   BELOW ARE SYMPTOMS THAT SHOULD BE REPORTED IMMEDIATELY:  *FEVER GREATER THAN 100.5 F  *CHILLS WITH OR WITHOUT FEVER  NAUSEA AND VOMITING THAT IS NOT CONTROLLED WITH YOUR NAUSEA MEDICATION  *UNUSUAL SHORTNESS OF BREATH  *UNUSUAL BRUISING OR BLEEDING  TENDERNESS IN MOUTH AND THROAT WITH OR WITHOUT PRESENCE OF ULCERS  *URINARY PROBLEMS  *BOWEL PROBLEMS  UNUSUAL RASH Items with * indicate a potential emergency and should be followed up as soon as possible.  Feel free to call the clinic should you have any questions or concerns. The clinic phone number is [phone].  Please show the CHEMO ALERT CARD at check-in to the Emergency Department and triage nurse.   

## 2018-11-19 NOTE — Progress Notes (Addendum)
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  Interval Since Last Radiation:  4 months  07/11/2018-07/22/2018:  30 Gy in 10 fractions to the T spine, Right Scapula, Right Femur, and Left Hip.  08/19/16-08/24/16:  Thoracic spine levels T9-T11 received 12 Gy. He was planning to receive 30 Gy in 10 fractions but this was inturuppted by emergent surgery due to cord compression of T10. He did not complete his course. He was treated in [place] with Dr. Lysle Morales.  Narrative:  Jesus [name] is a pleasant 63 y.o. gentleman with a history of multiple myeloma originally diagnosed in 2018 when he was living in [place]. He was being treated for palliative treatment with [place] to the T spine in the summer of 2018 and had to have emergency decompression and T10 corpectomy. He went on to undergo an autologous bone marrow transplant at the [facility] in January 2019. He has been on maintenance Carfilzomib. He was also treated more recently in June 2020 for painful bony lesions in the T spine, right scapula, right femur, and left hip. He was doing well but developed progressive pain in the cervical spine and PET imaging showing progressive disease in multiple locations in the skeleton and spcifically disease at C3 with concerns for possible destruction of the body that would warrant surgical referral. He also had disease in the right iliac and T5 location. 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 There was also a T2 lesion along the left posterior elements. He underwent C3 corpectomy with C2-C4 plating on 11/04/2018. He has recovered well and is seen today to discuss options of  postoperative radiotherapy.   On review of systems, the patient reports that he is doing well overall considering all he's been through in the last year. He reports he is quite tired and weak at times, but feels as though his pain in his neck is the greatest trouble. He is taking Morphine ER BID and also oxycodone 5 mg every 4 hours but cannot get quite enough relief to sleep well or go about his day. He continues to wear an immobilizing C collar. He denies any chest pain, shortness of breath, cough, fevers, chills, night sweats, unintended weight changes. He denies any bowel or bladder disturbances, and denies abdominal pain, nausea or vomiting. He denies any new musculoskeletal or joint aches or pains, new skin lesions or concerns. A complete review of systems is obtained and is otherwise negative.  Past Medical History:  Past Medical History:  Diagnosis Date   Arthritis    Cancer ([place])    multiple myeloma   Cervical stenosis of spinal canal    Wears glasses     Past Surgical History: Past Surgical History:  Procedure Laterality Date   ANTERIOR CERVICAL CORPECTOMY N/A 11/04/2018   Procedure: Cervical three Corpectomy with Cervical two to Cervical four Plating;  Surgeon: Erline Levine, MD;  Location: [facility];  Service: Neurosurgery;  Laterality: N/A;   APPENDECTOMY     BACK SURGERY     EYE SURGERY Bilateral    cataract surgery with lens implants   HERNIA REPAIR Left    inguinal    IR IMAGING GUIDED PORT INSERTION  06/21/2018  ROTATOR CUFF REPAIR Left     Social History:  Social History   Socioeconomic History   Marital status: Married    Spouse name: Not on file   Number of children: Not on file   Years of education: Not on file   Highest education level: Not on file  Occupational History   Not on file  Social Needs   Financial resource strain: Not on file   Food insecurity    Worry: Not on file    Inability: Not on file   Transportation needs     Medical: No    Non-medical: No  Tobacco Use   Smoking status: Never Smoker   Smokeless tobacco: Never Used  Substance and Sexual Activity   Alcohol use: Never    Frequency: Never   Drug use: Never   Sexual activity: Not on file  Lifestyle   Physical activity    Days per week: Not on file    Minutes per session: Not on file   Stress: Not on file  Relationships   Social connections    Talks on phone: Not on file    Gets together: Not on file    Attends religious service: Not on file    Active member of club or organization: Not on file    Attends meetings of clubs or organizations: Not on file    Relationship status: Not on file   Intimate partner violence    Fear of current or ex partner: Not on file    Emotionally abused: Not on file    Physically abused: Not on file    Forced sexual activity: Not on file  Other Topics Concern   Not on file  Social History Narrative   Not on file    Family History: Family History  Problem Relation Age of Onset   Breast cancer Sister    Medications: Current Outpatient Medications  Medication Sig Dispense Refill   acyclovir (ZOVIRAX) 400 MG tablet Take 1 tablet (400 mg total) by mouth 2 (two) times daily. 60 tablet 2   Calcium Carb-Cholecalciferol (CALCIUM 600+D3) 600-800 MG-UNIT TABS Take 1 tablet by mouth daily.     dexamethasone (DECADRON) 4 MG tablet Take 5 tablets (16m) with breakfast the day after each dose of Daratumumab (Patient taking differently: Take 20 mg by mouth See admin instructions. Take 5 tablets (223m with breakfast the day after each dose of Daratumumab) 120 tablet 1   dronabinol (MARINOL) 10 MG capsule TAKE 1 CAPSULE BY MOUTH TWICE DAILY BEFORE MEAL(S) (Patient taking differently: Take 10 mg by mouth 2 (two) times daily. ) 60 capsule 0   ergocalciferol (VITAMIN D2) 1.25 MG (50000 UT) capsule Take 1 capsule (50,000 Units total) by mouth 2 (two) times a week. 24 capsule 6   gabapentin (NEURONTIN) 300  MG capsule Take 1 capsule (300 mg total) by mouth 3 (three) times daily. 90 capsule 2   methocarbamol (ROBAXIN) 500 MG tablet Take 1 tablet (500 mg total) by mouth every 6 (six) hours as needed for muscle spasms. 60 tablet 0   morphine (MS CONTIN) 15 MG 12 hr tablet Take 1 tablet (15 mg total) by mouth every 12 (twelve) hours. 60 tablet 0   ondansetron (ZOFRAN) 4 MG tablet Take 1 tablet (4 mg total) by mouth every 8 (eight) hours as needed for nausea or vomiting. 30 tablet 0   senna-docusate (SENOKOT-S) 8.6-50 MG tablet Take 1 tablet by mouth daily.      sucralfate (CARAFATE) 1  g tablet Take 1 tablet (1 g total) by mouth 4 (four) times daily. 120 tablet 0   acetaminophen (TYLENOL) 325 MG tablet Take 325-650 mg by mouth every 6 (six) hours as needed (pain.).      aspirin 81 MG EC tablet Take 81 mg by mouth daily.      lidocaine-prilocaine (EMLA) cream Apply to affected area once (Patient not taking: Reported on 11/17/2018) 30 g 3   ondansetron (ZOFRAN) 8 MG tablet Take 1 tablet (8 mg total) by mouth 2 (two) times daily as needed (Nausea or vomiting). (Patient not taking: Reported on 10/27/2018) 30 tablet 1   oxyCODONE (OXY IR/ROXICODONE) 5 MG immediate release tablet Take 1 tablet (5 mg total) by mouth every 4 (four) hours as needed for severe pain. 120 tablet 0   POMALYST 2 MG capsule TAKE 1 CAPSULE BY MOUTH  DAILY FOR 21 DAYS, THEN 7  DAYS OFF TAKE WITH WATER (Patient not taking: TAKE 1 CAPSULE (2 MG) BY MOUTHDAILY FOR 21 DAYS, THEN 7DAYS OFF) 21 capsule 0   prochlorperazine (COMPAZINE) 10 MG tablet Take 1 tablet (10 mg total) by mouth every 6 (six) hours as needed (Nausea or vomiting). (Patient not taking: Reported on 10/27/2018) 30 tablet 1   No current facility-administered medications for this encounter.       Allergies:  Allergies  Allergen Reactions   Penicillins Tinitus    Did it involve swelling of the face/tongue/throat, SOB, or low BP? No Did it involve sudden or  severe rash/hives, skin peeling, or any reaction on the inside of your mouth or nose? No Did you need to seek medical attention at a hospital or doctor's office? Yes When did it last happen? More than 30 years ago If all above answers are "NO", may proceed with cephalosporin use.    Physical Exam: Wt Readings from Last 3 Encounters:  11/18/18 120 lb (54.4 kg)  11/17/18 120 lb (54.4 kg)  11/11/18 123 lb 14.4 oz (56.2 kg)   Temp Readings from Last 3 Encounters:  11/18/18 98.1 F (36.7 C) (Oral)  11/17/18 98.3 F (36.8 C) (Temporal)  11/11/18 98.5 F (36.9 C) (Temporal)   BP Readings from Last 3 Encounters:  11/18/18 127/72  11/17/18 131/86  11/11/18 137/83   Pulse Readings from Last 3 Encounters:  11/18/18 88  11/17/18 91  11/11/18 88   In general this is a thin tired appearing black male in no acute distress. He has an immobilizing C collar. He has limited movement as a result. He's alert and oriented x4 and appropriate throughout the examination. Cardiopulmonary assessment is negative for acute distress and he exhibits normal effort.     Impression/Plan: 1. 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 Dr. Lisbeth Renshaw discusses the pathology findings and reviews the nature of multiple myeloma and discusses the rationale to use radiotherapy to treat the postoperative site to reduce the changes of recurrence within the surgical site. He also recommends consideration of treatment to the right iliac and T5 vertebral body due to the concerns for progressive change and risk of causing pain and further degradation to these sites. Dr. Lisbeth Renshaw recommends proceeding with simulation today which will follow this appointment.. We discussed the risks, benefits, short, and long term effects of radiotherapy, and the patient is interested in proceeding. Dr. Lisbeth Renshaw discusses the delivery and logistics of radiotherapy and anticipates a course of 3 weeks of radiotherapy.  Written consent is obtained and placed in the chart, a copy  was provided to the patient. 2. Pain secondary to #1. The patient was advised to begin taking 10 mg (two of his 68m) Oxycodone tablets every 4-6 hours to see if this helps him any more than what he's been taking. That will likely result in needing to increase his long acting morphine as well.   In a visit lasting 45 minutes, greater than 50% of the time was spent face to face discussing his case, and coordinating the patient's care.   The above documentation reflects my direct findings during this shared patient visit. Please see the separate note by Dr. MLisbeth Renshawon this date for the remainder of the patient's plan of care.     ACarola Rhine PAC

## 2018-11-24 ENCOUNTER — Ambulatory Visit
Admission: RE | Admit: 2018-11-24 | Discharge: 2018-11-24 | Disposition: A | Discharge status: Home / Self Care | Payer: 59 | Source: Ambulatory Visit | Attending: Radiation Oncology | Admitting: Radiation Oncology

## 2018-11-24 ENCOUNTER — Other Ambulatory Visit: Payer: Self-pay

## 2018-11-25 ENCOUNTER — Ambulatory Visit
Admission: RE | Admit: 2018-11-25 | Discharge: 2018-11-25 | Disposition: A | Discharge status: Home / Self Care | Payer: 59 | Source: Ambulatory Visit | Attending: Radiation Oncology | Admitting: Radiation Oncology

## 2018-11-25 ENCOUNTER — Other Ambulatory Visit: Payer: Self-pay

## 2018-11-28 ENCOUNTER — Other Ambulatory Visit: Payer: Self-pay | Admitting: Hematology

## 2018-11-28 ENCOUNTER — Other Ambulatory Visit: Payer: Self-pay

## 2018-11-28 ENCOUNTER — Ambulatory Visit
Admission: RE | Admit: 2018-11-28 | Discharge: 2018-11-28 | Disposition: A | Discharge status: Home / Self Care | Payer: 59 | Source: Ambulatory Visit | Attending: Radiation Oncology | Admitting: Radiation Oncology

## 2018-11-29 ENCOUNTER — Ambulatory Visit: Admission: RE | Admit: 2018-11-29 | Payer: 59 | Source: Ambulatory Visit

## 2018-11-29 ENCOUNTER — Ambulatory Visit: Payer: 59

## 2018-11-29 ENCOUNTER — Encounter: Payer: Self-pay | Admitting: Hematology

## 2018-11-29 ENCOUNTER — Other Ambulatory Visit: Payer: Self-pay

## 2018-11-29 ENCOUNTER — Other Ambulatory Visit: Payer: Self-pay | Admitting: Radiation Oncology

## 2018-11-29 MED ORDER — MORPHINE SULFATE ER 30 MG PO TBCR: 30.0000 mg | EXTENDED_RELEASE_TABLET | Freq: Two times a day (BID) | ORAL | 0 refills | Status: DC

## 2018-11-29 MED ORDER — OXYCODONE HCL 15 MG PO TABS: 15.0000 mg | ORAL_TABLET | ORAL | 0 refills | Status: DC | PRN

## 2018-11-29 NOTE — Progress Notes (Signed)
The patient came to treatment today but cannot tolerate therapy and was not willing to try dilauid IM injection. He has a rx for MSContin 15 mg BID, and Oxycodone 5 mg q 4-6 hours.   In the last twenty four hours he increased his MSContin to 30 mg BID, and Oxycodone 10 mg q 4-6 hours, but at times has taken it every 3 hours.   We discussed making the change to MSContin 30 mg BID and a new script was sent electronically to his pharmacy. He also was prescribed Oxycodone 15 mg tablets to be taken q 4-6 hours prn. He will call us and let us know if his symptoms have improved with this and if so come for treatment. He will also keep a drug diary to determine how many pills he's taken. By my calculations I believe he has taken at least 180 mg oral morphine in the last 24 hours. Hopefully a mild change in his regimen will help his pain, but we can increase his long acting if this has not helped.

## 2018-11-29 NOTE — Progress Notes (Signed)
Patient came to advise his insurance termed 11/03/18 and he won't have Medicare until 12/20 and that he does not have insurance until then.  Advised patient he would need to apply and decision made before he can apply for financial assistance through [facility]. Gave him a Medicaid application to submit to [employer] [address] [phone]. Also advised he may apply online or over the phone.  He has my card for any additional financial questions or concerns.

## 2018-11-30 ENCOUNTER — Ambulatory Visit
Admission: RE | Admit: 2018-11-30 | Discharge: 2018-11-30 | Disposition: A | Discharge status: Home / Self Care | Payer: 59 | Source: Ambulatory Visit | Attending: Radiation Oncology | Admitting: Radiation Oncology

## 2018-11-30 ENCOUNTER — Other Ambulatory Visit: Payer: Self-pay

## 2018-12-01 ENCOUNTER — Other Ambulatory Visit: Payer: Self-pay

## 2018-12-01 ENCOUNTER — Ambulatory Visit
Admission: RE | Admit: 2018-12-01 | Discharge: 2018-12-01 | Disposition: A | Discharge status: Home / Self Care | Payer: 59 | Source: Ambulatory Visit | Attending: Radiation Oncology | Admitting: Radiation Oncology

## 2018-12-02 ENCOUNTER — Ambulatory Visit
Admission: RE | Admit: 2018-12-02 | Discharge: 2018-12-02 | Disposition: A | Discharge status: Home / Self Care | Payer: 59 | Source: Ambulatory Visit | Attending: Radiation Oncology | Admitting: Radiation Oncology

## 2018-12-02 ENCOUNTER — Other Ambulatory Visit: Payer: Self-pay

## 2018-12-02 ENCOUNTER — Inpatient Hospital Stay: Payer: Self-pay

## 2018-12-02 ENCOUNTER — Inpatient Hospital Stay (HOSPITAL_BASED_OUTPATIENT_CLINIC_OR_DEPARTMENT_OTHER): Payer: Self-pay | Admitting: Hematology

## 2018-12-02 ENCOUNTER — Telehealth: Payer: Self-pay | Admitting: Hematology

## 2018-12-02 VITALS — BP 125/80 | HR 96 | Temp 98.2°F | Resp 16

## 2018-12-02 VITALS — BP 145/83 | HR 99 | Temp 98.5°F | Resp 18 | Ht 67.0 in | Wt 120.8 lb

## 2018-12-02 DIAGNOSIS — C9000 Multiple myeloma not having achieved remission: Secondary | ICD-10-CM

## 2018-12-02 DIAGNOSIS — Z7189 Other specified counseling: Secondary | ICD-10-CM

## 2018-12-02 LAB — CBC WITH DIFFERENTIAL (CANCER CENTER ONLY)
Abs Immature Granulocytes: 0.02 10*3/uL (ref 0.00–0.07)
Basophils Absolute: 0 10*3/uL (ref 0.0–0.1)
Basophils Relative: 0 %
Eosinophils Absolute: 0 10*3/uL (ref 0.0–0.5)
Eosinophils Relative: 1 %
HCT: 30.2 % — ABNORMAL LOW (ref 39.0–52.0)
Hemoglobin: 9.9 g/dL — ABNORMAL LOW (ref 13.0–17.0)
Immature Granulocytes: 1 %
Lymphocytes Relative: 23 %
Lymphs Abs: 0.6 10*3/uL — ABNORMAL LOW (ref 0.7–4.0)
MCH: 30.7 pg (ref 26.0–34.0)
MCHC: 32.8 g/dL (ref 30.0–36.0)
MCV: 93.5 fL (ref 80.0–100.0)
Monocytes Absolute: 0.3 10*3/uL (ref 0.1–1.0)
Monocytes Relative: 13 %
Neutro Abs: 1.6 10*3/uL — ABNORMAL LOW (ref 1.7–7.7)
Neutrophils Relative %: 62 %
Platelet Count: 105 10*3/uL — ABNORMAL LOW (ref 150–400)
RBC: 3.23 MIL/uL — ABNORMAL LOW (ref 4.22–5.81)
RDW: 15.4 % (ref 11.5–15.5)
WBC Count: 2.6 10*3/uL — ABNORMAL LOW (ref 4.0–10.5)
nRBC: 0 % (ref 0.0–0.2)

## 2018-12-02 LAB — CMP (CANCER CENTER ONLY)
ALT: 13 U/L (ref 0–44)
AST: 14 U/L — ABNORMAL LOW (ref 15–41)
Albumin: 3.6 g/dL (ref 3.5–5.0)
Alkaline Phosphatase: 80 U/L (ref 38–126)
Anion gap: 9 (ref 5–15)
BUN: 5 mg/dL — ABNORMAL LOW (ref 8–23)
CO2: 28 mmol/L (ref 22–32)
Calcium: 9.8 mg/dL (ref 8.9–10.3)
Chloride: 101 mmol/L (ref 98–111)
Creatinine: 0.69 mg/dL (ref 0.61–1.24)
GFR, Est AFR Am: >60 mL/min (ref >60)
GFR, Estimated: >60 mL/min (ref >60)
Glucose, Bld: 111 mg/dL — ABNORMAL HIGH (ref 70–99)
Potassium: 3.6 mmol/L (ref 3.5–5.1)
Sodium: 138 mmol/L (ref 135–145)
Total Bilirubin: 0.3 mg/dL (ref 0.3–1.2)
Total Protein: 6.7 g/dL (ref 6.5–8.1)

## 2018-12-02 MED ORDER — FAMOTIDINE IN NACL 20-0.9 MG/50ML-% IV SOLN
INTRAVENOUS | Status: AC
  Filled 2018-12-02: qty 50

## 2018-12-02 MED ORDER — SODIUM CHLORIDE 0.9% FLUSH
10.0000 mL | INTRAVENOUS | Status: DC | PRN
  Administered 2018-12-02: 10 mL
  Filled 2018-12-02: qty 10

## 2018-12-02 MED ORDER — FAMOTIDINE IN NACL 20-0.9 MG/50ML-% IV SOLN
20.0000 mg | Freq: Once | INTRAVENOUS | Status: AC
  Administered 2018-12-02: 20 mg via INTRAVENOUS

## 2018-12-02 MED ORDER — DIPHENHYDRAMINE HCL 25 MG PO CAPS
50.0000 mg | ORAL_CAPSULE | Freq: Once | ORAL | Status: AC
  Administered 2018-12-02: 50 mg via ORAL

## 2018-12-02 MED ORDER — ACETAMINOPHEN 325 MG PO TABS
650.0000 mg | ORAL_TABLET | Freq: Once | ORAL | Status: AC
  Administered 2018-12-02: 650 mg via ORAL

## 2018-12-02 MED ORDER — HEPARIN SOD (PORK) LOCK FLUSH 100 UNIT/ML IV SOLN
500.0000 [IU] | Freq: Once | INTRAVENOUS | Status: AC | PRN
  Administered 2018-12-02: 500 [IU]
  Filled 2018-12-02: qty 5

## 2018-12-02 MED ORDER — ACETAMINOPHEN 325 MG PO TABS
ORAL_TABLET | ORAL | Status: AC
  Filled 2018-12-02: qty 2

## 2018-12-02 MED ORDER — SODIUM CHLORIDE 0.9 % IV SOLN
15.6000 mg/kg | Freq: Once | INTRAVENOUS | Status: AC
  Administered 2018-12-02: 900 mg via INTRAVENOUS
  Filled 2018-12-02: qty 40

## 2018-12-02 MED ORDER — DIPHENHYDRAMINE HCL 25 MG PO CAPS
ORAL_CAPSULE | ORAL | Status: AC
  Filled 2018-12-02: qty 2

## 2018-12-02 MED ORDER — SODIUM CHLORIDE 0.9 % IV SOLN
Freq: Once | INTRAVENOUS | Status: AC
  Administered 2018-12-02: 13:00:00 via INTRAVENOUS
  Filled 2018-12-02: qty 250

## 2018-12-02 MED ORDER — SODIUM CHLORIDE 0.9 % IV SOLN
20.0000 mg | Freq: Once | INTRAVENOUS | Status: AC
  Administered 2018-12-02: 14:00:00 20 mg via INTRAVENOUS
  Filled 2018-12-02: qty 20

## 2018-12-02 NOTE — Patient Instructions (Signed)
[facility] Discharge Instructions for Patients Receiving Chemotherapy  Today you received the following chemotherapy agents: Darzalex  To help prevent nausea and vomiting after your treatment, we encourage you to take your nausea medication as directed.    If you develop nausea and vomiting that is not controlled by your nausea medication, call the clinic.   BELOW ARE SYMPTOMS THAT SHOULD BE REPORTED IMMEDIATELY:  *FEVER GREATER THAN 100.5 F  *CHILLS WITH OR WITHOUT FEVER  NAUSEA AND VOMITING THAT IS NOT CONTROLLED WITH YOUR NAUSEA MEDICATION  *UNUSUAL SHORTNESS OF BREATH  *UNUSUAL BRUISING OR BLEEDING  TENDERNESS IN MOUTH AND THROAT WITH OR WITHOUT PRESENCE OF ULCERS  *URINARY PROBLEMS  *BOWEL PROBLEMS  UNUSUAL RASH Items with * indicate a potential emergency and should be followed up as soon as possible.  Feel free to call the clinic should you have any questions or concerns. The clinic phone number is [phone].  Please show the CHEMO ALERT CARD at check-in to the Emergency Department and triage nurse.   

## 2018-12-02 NOTE — Progress Notes (Signed)
HEMATOLOGY/ONCOLOGY CLINIC NOTE  Date of Service: 12/02/2018  Patient Care Team: Patient, No Pcp Per as PCP - General (General Practice)  CHIEF COMPLAINTS/PURPOSE OF CONSULTATION:   Continued management of Multiple Myeloma  Oncologic History:   Jesus [name] was diagnosed with multiple myeloma in July 2018 in [place]. He initially developed mid back pain in May 2018 and was seen to have a T10 vertebral body mass with additional lytic lesion at T9. His initial M spike in July 2018 was 1.5g with IgG Lambda specificity and IgG elevated at 2235m. His July 2018 BM Bx revealed 20% monoclonal plasma cells and FISH revealed a 17p deletion. He received 4 fractions of palliative RT, however his back pain worsened and developed bilateral lower extremity numbness and tingling, and was then seen to have a T10 pathologic fracture with cord compression s/p T10 corpectomy. The pt began VRd on 09/24/16, completed 7 cycles, then [facility], post transplant complicated by neutropenic colitis and deconditioning. Repeat BM Bx on 04/21/17 revealed residual plasma cell with less than 5% lambda light chain restricted plasma cells, and a post-transplant M spike of 0.4g. The pt then began maintenance 540mm2 Carfilzomib every 2 weeks on 06/16/17.  HISTORY OF PRESENTING ILLNESS:   Jesus STMARIEs a wonderful [age].o. male who has been referred to [place] Dr. BePhil Doppor evaluation and management of Multiple Myeloma. The pt reports that he is doing well overall.  The [place], the pt has moved back to his home which is here locally, and is transferring his care here now as well. The pt notes that he is anticipating C11D1 maintenance Carfilzomib today. The pt notes that his most recent  M spike was 0.2g. His last BM biopsy was March 2019, as noted above. He denies any infection issues in the last year. The pt notes that he has received all of is post-transplant vaccinations. [facility]  The pt reports that he continues to have back pain. He has been receiving Zometa every 4 weeks, but was recently transitioned to every 3 months, he denies dental concerns at this time. He is taking Calcium and Vitamin D replacement.   The pt has been taking Marinol for appetite stimulation.   The pt notes that he has an enlarged prostate, which was seen on imaging. He notes that he was expecting to receive a PSA test soon.  The pt notes that prior to his Multiple Myeloma diagnosis in July 2018, he had a "clean bill of health." He denies DM, HTN, lung problems, heart problems, thyroid problems or other medical concerns.  Most recent lab results (03/23/18) of CBC is as follows: all values are WNL except for RBC at 4.03, HGB at 11.5, HCT at 35.6%, MPV at 8.9, Glucose at 135, Total Protein at 6.2.  On review of systems, pt reports chronic back pain, stable energy levels, and denies concerns for infections, new pain along the spine, abdominal pains, leg swelling, and any other symptoms.   On PMHx the pt reports Multiple Myeloma, denies HTN, HLD, lung problems, strokes, seizures, heart problems and thyroid problems. On Social Hx the pt reports working for the [employer] Interval History:  Jesus [name] returns today for management and evaluation of his multiple myeloma not in remission. The patient's last visit with [place] was on 11/11/2018. The pt reports that he is doing well overall.  The pt reports he is having pain right side of head that runs down his neck and into his shoulder. Sometimes it is a very sharp pain. Certain positions do not stimulate pain. The pain comes and goes.   He took morphine and oxyCODONE but it did not  relieve the pain. Pain is much improved now.  He is on day six of radiation out of a total of 14 treatments  Lab results today (12/02/18) of CBC w/diff and CMP is as follows: all values are WNL except for WBC count at 2.6, RBC at 3.23, Hemoglobin at 9.9, HCT at 30.2, Platelet Count at 105, Neutro abs at 1.6, Lymphs Abs at 0.6, Glucose Bld at 111, Bun at 5, AST at 14.  On review of systems, pt reports pain on right side of head that radiates down his neck and into his shoulder, slight spinal pain, mild constipation  and denies fevers, infections, difficulty swallowing, and any other symptoms.      MEDICAL HISTORY:  Past Medical History:  Diagnosis Date   Arthritis    Cancer ([place])    multiple myeloma   Cervical stenosis of spinal canal    Wears glasses     SURGICAL HISTORY: Past Surgical History:  Procedure Laterality Date   ANTERIOR CERVICAL CORPECTOMY N/A 11/04/2018   Procedure: Cervical three Corpectomy with Cervical two to Cervical four Plating;  Surgeon: Erline Levine, MD;  Location: [facility];  Service: Neurosurgery;  Laterality: N/A;   APPENDECTOMY     BACK SURGERY     EYE SURGERY Bilateral    cataract surgery with lens implants   HERNIA REPAIR Left    inguinal    IR IMAGING GUIDED PORT INSERTION  06/21/2018   ROTATOR CUFF REPAIR Left     SOCIAL HISTORY: Social History   Socioeconomic History   Marital status: Married    Spouse name: Not on file   Number of children: Not on file   Years of education: Not on file   Highest education level: Not on file  Occupational History   Not on file  Social Needs   Financial resource strain: Not on file   Food insecurity    Worry: Not on file    Inability: Not on file   Transportation needs    Medical: No    Non-medical: No  Tobacco Use   Smoking status: Never Smoker   Smokeless tobacco: Never Used  Substance and Sexual Activity   Alcohol use: Never    Frequency: Never   Drug use: Never   Sexual  activity: Not on file  Lifestyle   Physical activity    Days per week: Not on file    Minutes per session: Not on file   Stress: Not on file  Relationships   Social connections    Talks on phone: Not on file    Gets together: Not on file    Attends religious service: Not on file    Active member of club or organization: Not on file    Attends meetings of clubs or organizations: Not on file    Relationship status: Not on file   Intimate partner violence    Fear of current or ex partner: Not on file    Emotionally abused: Not on file  Physically abused: Not on file    Forced sexual activity: Not on file  Other Topics Concern   Not on file  Social History Narrative   Not on file    FAMILY HISTORY: Family History  Problem Relation Age of Onset   Breast cancer Sister     ALLERGIES:  is allergic to penicillins.  MEDICATIONS:  Current Outpatient Medications  Medication Sig Dispense Refill   acetaminophen (TYLENOL) 325 MG tablet Take 325-650 mg by mouth every 6 (six) hours as needed (pain.).      acyclovir (ZOVIRAX) 400 MG tablet Take 1 tablet (400 mg total) by mouth 2 (two) times daily. 60 tablet 2   aspirin 81 MG EC tablet Take 81 mg by mouth daily.      Calcium Carb-Cholecalciferol (CALCIUM 600+D3) 600-800 MG-UNIT TABS Take 1 tablet by mouth daily.     dexamethasone (DECADRON) 4 MG tablet Take 5 tablets (84m) with breakfast the day after each dose of Daratumumab (Patient taking differently: Take 20 mg by mouth See admin instructions. Take 5 tablets (269m with breakfast the day after each dose of Daratumumab) 120 tablet 1   dronabinol (MARINOL) 10 MG capsule TAKE 1 CAPSULE BY MOUTH TWICE DAILY BEFORE MEAL(S) 60 capsule 0   ergocalciferol (VITAMIN D2) 1.25 MG (50000 UT) capsule Take 1 capsule (50,000 Units total) by mouth 2 (two) times a week. 24 capsule 6   gabapentin (NEURONTIN) 300 MG capsule Take 1 capsule (300 mg total) by mouth 3 (three) times daily. 90  capsule 2   lidocaine-prilocaine (EMLA) cream Apply to affected area once (Patient not taking: Reported on 11/17/2018) 30 g 3   methocarbamol (ROBAXIN) 500 MG tablet Take 1 tablet (500 mg total) by mouth every 6 (six) hours as needed for muscle spasms. 60 tablet 0   morphine (MS CONTIN) 30 MG 12 hr tablet Take 1 tablet (30 mg total) by mouth every 12 (twelve) hours. 60 tablet 0   ondansetron (ZOFRAN) 4 MG tablet Take 1 tablet (4 mg total) by mouth every 8 (eight) hours as needed for nausea or vomiting. 30 tablet 0   ondansetron (ZOFRAN) 8 MG tablet Take 1 tablet (8 mg total) by mouth 2 (two) times daily as needed (Nausea or vomiting). (Patient not taking: Reported on 10/27/2018) 30 tablet 1   oxyCODONE (ROXICODONE) 15 MG immediate release tablet Take 1 tablet (15 mg total) by mouth every 4 (four) hours as needed for severe pain. 120 tablet 0   POMALYST 2 MG capsule TAKE 1 CAPSULE BY MOUTH  DAILY FOR 21 DAYS, THEN 7  DAYS OFF TAKE WITH WATER (Patient not taking: TAKE 1 CAPSULE (2 MG) BY MOUTHDAILY FOR 21 DAYS, THEN 7DAYS OFF) 21 capsule 0   prochlorperazine (COMPAZINE) 10 MG tablet Take 1 tablet (10 mg total) by mouth every 6 (six) hours as needed (Nausea or vomiting). (Patient not taking: Reported on 10/27/2018) 30 tablet 1   senna-docusate (SENOKOT-S) 8.6-50 MG tablet Take 1 tablet by mouth daily.      sucralfate (CARAFATE) 1 g tablet Take 1 tablet (1 g total) by mouth 4 (four) times daily. 120 tablet 0   No current facility-administered medications for this visit.     REVIEW OF SYSTEMS:    A 10+ POINT REVIEW OF SYSTEMS WAS OBTAINED including neurology, dermatology, psychiatry, cardiac, respiratory, lymph, extremities, GI, GU, Musculoskeletal, constitutional, breasts, reproductive, HEENT.  All pertinent positives are noted in the HPI.  All others are negative.     PHYSICAL EXAMINATION:  ECOG FS:1 - Symptomatic but completely ambulatory  There were no vitals filed for this  visit. Wt Readings from Last 3 Encounters:  11/18/18 120 lb (54.4 kg)  11/17/18 120 lb (54.4 kg)  11/11/18 123 lb 14.4 oz (56.2 kg)   There is no height or weight on file to calculate BMI.    GENERAL:alert, in no acute distress and comfortable SKIN: no acute rashes, no significant lesions EYES: conjunctiva are pink and non-injected, sclera anicteric OROPHARYNX: MMM, no exudates, no oropharyngeal erythema or ulceration NECK: supple, no JVD LYMPH:  no palpable lymphadenopathy in the cervical, axillary or inguinal regions LUNGS: clear to auscultation b/l with normal respiratory effort HEART: regular rate & rhythm ABDOMEN:  normoactive bowel sounds , non tender, not distended. Extremity: no pedal edema PSYCH: alert & oriented x 3 with fluent speech NEURO: no focal motor/sensory deficits    LABORATORY DATA:  I have reviewed the data as listed  . CBC Latest Ref Rng & Units 11/18/2018 11/11/2018 10/21/2018  WBC 4.0 - 10.5 K/uL 4.5 3.6(L) 3.7(L)  Hemoglobin 13.0 - 17.0 g/dL 10.5(L) 9.5(L) 11.3(L)  Hematocrit 39.0 - 52.0 % 32.0(L) 28.8(L) 33.6(L)  Platelets 150 - 400 K/uL 153 133(L) 130(L)    . CMP Latest Ref Rng & Units 11/18/2018 11/11/2018 10/21/2018  Glucose 70 - 99 mg/dL 97 112(H) 122(H)  BUN 8 - 23 mg/dL '13 9 10  ' Creatinine 0.61 - 1.24 mg/dL 0.69 0.65 0.71  Sodium 135 - 145 mmol/L 141 139 139  Potassium 3.5 - 5.1 mmol/L 3.8 4.0 3.8  Chloride 98 - 111 mmol/L 104 103 102  CO2 22 - 32 mmol/L '27 27 27  ' Calcium 8.9 - 10.3 mg/dL 9.7 8.9 9.5  Total Protein 6.5 - 8.1 g/dL 6.9 6.0(L) 6.5  Total Bilirubin 0.3 - 1.2 mg/dL 0.3 0.4 0.4  Alkaline Phos 38 - 126 U/L 102 109 74  AST 15 - 41 U/L 13(L) 15 20  ALT 0 - 44 U/L '17 25 24   ' 10/02/2020Cervical three Corpectomy with Cervical two to Cervical four Plating (N/A) with PEEK cage, DBM   11/04/2018 ([id number]) DG Cervical Spine 1 View   10/26/2018 ([id number])AL CERVICAL SPINE W WO CONTRAST  06/30/18 BM Biopsy:    08/22/2018 Bone  Marrow Biopsy     RADIOGRAPHIC STUDIES: I have personally reviewed the radiological images as listed and agreed with the findings in the report. Dg Cervical Spine 1 View  Result Date: 11/04/2018 CLINICAL DATA:  Cervical fusion EXAM: DG CERVICAL SPINE - 1 VIEW; DG C-ARM 1-60 MIN COMPARISON:  None. FLUOROSCOPY TIME:  Radiation Exposure Index (as provided by the fluoroscopic device): Not available If the device does not provide the exposure index: Fluoroscopy Time:  19 seconds Number of Acquired Images:  1 FINDINGS: Interbody fusion is noted at C2-3 and C3-4 with anterior fixation extending from C2-C4. IMPRESSION: C2-C4 fusion. Electronically Signed   By: Inez Catalina M.D.   On: 11/04/2018 16:16   Dg C-arm 1-60 Min  Result Date: 11/04/2018 CLINICAL DATA:  Cervical fusion EXAM: DG CERVICAL SPINE - 1 VIEW; DG C-ARM 1-60 MIN COMPARISON:  None. FLUOROSCOPY TIME:  Radiation Exposure Index (as provided by the fluoroscopic device): Not available If the device does not provide the exposure index: Fluoroscopy Time:  19 seconds Number of Acquired Images:  1 FINDINGS: Interbody fusion is noted at C2-3 and C3-4 with anterior fixation extending from C2-C4. IMPRESSION: C2-C4 fusion. Electronically Signed   By: Inez Catalina M.D.   On: 11/04/2018 16:16  ASSESSMENT & PLAN:  63 y.o. male with  1. 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 IgG at 2220. 08/11/16 PET/CT revealed Hypermetabolic large soft tissue mass in the lower thoracic paraspinal region associated with lytic destruction of T10 vertebral body, concerning for multiple myeloma. Additional smaller lytic lesions involving the skeleton. S/p RT x 4 fractions, discontinued due to T10 pathologic fracture with severe cord compression S/p 08/26/16 T10 corpectomy and T8-L1  posterior spinal fusion   Began 7 cycles of VRd on 09/24/16 S/p autologous stem cell transplant, Day 0 on 02/23/17 04/21/17 BM Bx with residual less than 5% lambda light chain restricted plasma cells in the bone marrow. M Spike at 0.4g Began maintenance 39m/m2 Carfilzomib every 2 weeks on 06/16/17  06/09/18 PET/CT revealed "Multifocal hypermetabolic lytic lesions in the skeleton as noted above, compatible with active myeloma. In the spine, the most notable active lesion of concern is at the T12 level with there is a large right eccentric vertebral body lesion with demineralization of the cortex and likely some paravertebral extension of tumor. This could cause loosening of the right pedicle screw. Intraspinal extension of tumor is difficult to exclude. MRI might be considered although the posterolateral rod and pedicle screws may cause artifact. 2. There is likewise cortical breakthrough associated with the lytic lesion along the left upper acetabulum. 3. A lesion in the left intertrochanteric area of the femur is large enough to potentially cause biomechanical weakening which increases risk of fracture. 4. Other skeletal sites of active involvement are detailed above in the skeleton section. 5. Several lucent lesions are observed including the calvarium, T1 vertebral body, and left L4 pedicle which are not hypermetabolic and may represent effectively previously treated lesions. 6. 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 There is also a lipoma along the right brachialis muscle. 7. Nonspecific subcutaneous edema especially in the right forearm but also extending up into the right upper arm. Cause is uncertain. There is some low-grade nonfocal metabolic activity along this area. Strictly speaking, cellulitis is not excluded, correlate with clinical history and visual inspection. 8. Other imaging findings of potential clinical significance:  Aortic Atherosclerosis. Mild cardiomegaly. Cholelithiasis. Prostatomegaly."  06/17/18 MMP revealed M Protein at 0.5g, just before beginning C1 Daratumumab  06/30/18 BM Biopsy revealed normocellular marrow with minimal involvement by plasma cell neoplasm (5% plasma cells), normal cytogenetics.  06/30/18 Molecular Cytogenetics did not have enough material for testing.  10/13/2018 PET whole body revealed "1. Mixed response to therapy with some lesions decreased significantly in metabolic activity and others increased in metabolic activity as well size on the CT portion exam. 2. Expansion of lytic lesion at C3 with loss of a large portion of the vertebral body structure and intense metabolic activity. Recommend non emergent neuro surgical consultation for evaluation of this destructive C3 lesion. Dedicated CT or MRI of the neck may also provide more information. 3. Increase in size and metabolic activity of RIGHT iliac lesion and T5 vertebral body lesion. 4. Marked improvement with reduction in metabolic activity of RIGHT humerus and scapular metastatic lesions as well as marked reduction in metabolic activity large LEFT iliac bone lesion. 5. No evidence of soft tissue plasmacytoma or metastasis."  PLAN: -Discussed pt labwork today, 11/18/18; CBC  w/diff and CMP is as follows: all values are WNL except for RBC at 3.41, Hemoglobin at 10.5, HCT at 32.0, AST at 13, Glucose Bld at 111, BUN at 5, AST at 14,  -Discussed pain that radiates from right side of his head, down his neck, and into his right shoulder blade, -Advised to call and inform Dr.Stern  About the pain in his head, neck, and shoulder - likely post-op pain -- improving. -MRI not necessary now if he is not having pain currently suggested monitoring it and following up with Dr.Stern. He may want further imagining  -Advised that he has resistant bone lesions that means he will have to switch treatments. Advised pt to follow up with Dr.Rodriguez sooner  than January 2021 -- My RN also called to try to get him to see Dr Norma Fredrickson sooner for consideration of trials ? With BCMA abx -Advised that options to treat his disease with his mutation are depleting quickly and consideration of clinical trials for BCMA directed treatment might be useful. trial medicine is suggested -Suggested biotene mouth wash for dry mouth caused by pain medication. Also suggested a stem humidifier.  FOLLOW UP: -Please schedule next 2 cycles of Daratumumab with portflush and labs -F/u with Dr Norma Fredrickson in 2-3 weeks to determine further rx options for progressive refractory myeloma. _RTC with Dr Irene Limbo in 4 weeks    The total time spent in the appt was 25 minutes and more than 50% was on counseling and direct patient cares.  All of the patient's questions were answered with apparent satisfaction. The patient knows to call the clinic with any problems, questions or concerns.    Sullivan Lone MD MS AAHIVMS [facility] Hematology/Oncology Physician [facility]  (Office):       [phone] (Work cell):  [phone] (Fax):           [phone]  12/02/2018 1:14 AM  I, Scot Dock, am acting as a scribe for Dr. Sullivan Lone.   .I have reviewed the above documentation for accuracy and completeness, and I agree with the above. Brunetta Genera MD

## 2018-12-02 NOTE — Telephone Encounter (Signed)
Per 10/30 los appts already scheduled.

## 2018-12-05 ENCOUNTER — Ambulatory Visit
Admission: RE | Admit: 2018-12-05 | Discharge: 2018-12-05 | Disposition: A | Discharge status: Home / Self Care | Payer: Self-pay | Source: Ambulatory Visit | Attending: Radiation Oncology | Admitting: Radiation Oncology

## 2018-12-05 ENCOUNTER — Other Ambulatory Visit: Payer: Self-pay

## 2018-12-05 DIAGNOSIS — C9000 Multiple myeloma not having achieved remission: Secondary | ICD-10-CM | POA: Insufficient documentation

## 2018-12-05 DIAGNOSIS — Z510 Encounter for antineoplastic radiation therapy: Secondary | ICD-10-CM | POA: Insufficient documentation

## 2018-12-06 ENCOUNTER — Other Ambulatory Visit: Payer: Self-pay

## 2018-12-06 ENCOUNTER — Ambulatory Visit: Admission: RE | Admit: 2018-12-06 | Payer: Self-pay | Source: Ambulatory Visit

## 2018-12-07 ENCOUNTER — Ambulatory Visit: Payer: Self-pay

## 2018-12-07 ENCOUNTER — Telehealth: Payer: Self-pay | Admitting: *Deleted

## 2018-12-07 NOTE — Telephone Encounter (Signed)
Returned patient's phone call, lvm for a return call 

## 2018-12-08 ENCOUNTER — Encounter: Payer: Self-pay | Admitting: Radiation Oncology

## 2018-12-08 ENCOUNTER — Other Ambulatory Visit: Payer: Self-pay

## 2018-12-08 ENCOUNTER — Ambulatory Visit
Admission: RE | Admit: 2018-12-08 | Discharge: 2018-12-08 | Disposition: A | Discharge status: Home / Self Care | Payer: Self-pay | Source: Ambulatory Visit | Attending: Radiation Oncology | Admitting: Radiation Oncology

## 2018-12-08 ENCOUNTER — Other Ambulatory Visit: Payer: Self-pay | Admitting: Radiation Therapy

## 2018-12-08 ENCOUNTER — Telehealth: Payer: Self-pay | Admitting: Hematology

## 2018-12-08 NOTE — Telephone Encounter (Signed)
[facility] CME 11/13. Moved lab/fu to 11/12. Infusion remains 11/13. Left message for patient (*2). Schedule mailed.

## 2018-12-08 NOTE — Progress Notes (Signed)
The patient was seen today for his undertreatment visit with Dr. Lisbeth Renshaw, I wanted to take some time to document our discussion as well.  The patient has completed a total of 7 of the 14 treatments to the right iliac, T5 and cervical spine levels.  He has had tremendous difficulty with pain and is currently receiving long-acting morphine and short-acting oxycodone.  Last week he was having significant pain in order to get through treatment we increased his morphine to 30 mg twice daily and increased his oxycodone to 15 mg every 4 hours.  He reports he is taking the medication as prescribed but has not noticed much relief.  When he comes for treatment he reports extraordinary pain with wearing the mask that immobilizes him for treatment.  After talking with Dr. Lisbeth Renshaw, we discussed that we could try and resimulate his treatment plan so that we could have him in a more comfortable position.  The patient was not willing to try continuing with the current scheme and mask despite offering Dilaudid injection.  I am concerned that his morphine is not adequately helping with his pain in my calculations are that he is exceeding 225 mg of morphine in a 24-hour.  I am also concerned about making significant alterations in his medication as we hope that the radiotherapy would help his symptoms.  For now we discussed off label use of his morphine 3 times a day rather than twice daily.  He will still be able to use his oxycodone and will give [place] an idea of his 24-hour needs so that additional calculations and determination of his medication can be considered however I will also copy medical oncology as I think this might continue to be an ongoing issue trying to manage his pain.     Carola Rhine, PAC

## 2018-12-09 ENCOUNTER — Other Ambulatory Visit: Payer: Self-pay

## 2018-12-09 ENCOUNTER — Ambulatory Visit
Admission: RE | Admit: 2018-12-09 | Discharge: 2018-12-09 | Disposition: A | Discharge status: Home / Self Care | Payer: Self-pay | Source: Ambulatory Visit | Attending: Radiation Oncology | Admitting: Radiation Oncology

## 2018-12-09 DIAGNOSIS — C9000 Multiple myeloma not having achieved remission: Secondary | ICD-10-CM

## 2018-12-12 ENCOUNTER — Ambulatory Visit
Admission: RE | Admit: 2018-12-12 | Discharge: 2018-12-12 | Disposition: A | Discharge status: Home / Self Care | Payer: Self-pay | Source: Ambulatory Visit | Attending: Radiation Oncology | Admitting: Radiation Oncology

## 2018-12-12 ENCOUNTER — Other Ambulatory Visit: Payer: Self-pay

## 2018-12-13 ENCOUNTER — Other Ambulatory Visit: Payer: Self-pay

## 2018-12-13 ENCOUNTER — Ambulatory Visit
Admission: RE | Admit: 2018-12-13 | Discharge: 2018-12-13 | Disposition: A | Discharge status: Home / Self Care | Payer: Self-pay | Source: Ambulatory Visit | Attending: Radiation Oncology | Admitting: Radiation Oncology

## 2018-12-13 ENCOUNTER — Ambulatory Visit: Payer: Self-pay

## 2018-12-14 ENCOUNTER — Ambulatory Visit
Admission: RE | Admit: 2018-12-14 | Discharge: 2018-12-14 | Disposition: A | Discharge status: Home / Self Care | Payer: Self-pay | Source: Ambulatory Visit | Attending: Radiation Oncology | Admitting: Radiation Oncology

## 2018-12-14 ENCOUNTER — Ambulatory Visit: Payer: Self-pay

## 2018-12-15 ENCOUNTER — Inpatient Hospital Stay: Payer: Self-pay

## 2018-12-15 ENCOUNTER — Inpatient Hospital Stay: Payer: Self-pay | Attending: Hematology

## 2018-12-15 ENCOUNTER — Telehealth: Payer: Self-pay | Admitting: Hematology

## 2018-12-15 ENCOUNTER — Inpatient Hospital Stay (HOSPITAL_BASED_OUTPATIENT_CLINIC_OR_DEPARTMENT_OTHER): Payer: Self-pay | Admitting: Hematology

## 2018-12-15 ENCOUNTER — Ambulatory Visit
Admission: RE | Admit: 2018-12-15 | Discharge: 2018-12-15 | Disposition: A | Discharge status: Home / Self Care | Payer: Self-pay | Source: Ambulatory Visit | Attending: Radiation Oncology | Admitting: Radiation Oncology

## 2018-12-15 ENCOUNTER — Other Ambulatory Visit: Payer: Self-pay

## 2018-12-15 ENCOUNTER — Ambulatory Visit: Payer: Self-pay

## 2018-12-15 VITALS — BP 144/82 | HR 96 | Temp 98.0°F | Resp 18 | Ht 67.0 in | Wt 118.6 lb

## 2018-12-15 DIAGNOSIS — D649 Anemia, unspecified: Secondary | ICD-10-CM

## 2018-12-15 DIAGNOSIS — C9000 Multiple myeloma not having achieved remission: Secondary | ICD-10-CM

## 2018-12-15 DIAGNOSIS — Z5112 Encounter for antineoplastic immunotherapy: Secondary | ICD-10-CM | POA: Insufficient documentation

## 2018-12-15 DIAGNOSIS — Z7189 Other specified counseling: Secondary | ICD-10-CM

## 2018-12-15 DIAGNOSIS — Z95828 Presence of other vascular implants and grafts: Secondary | ICD-10-CM

## 2018-12-15 LAB — CBC WITH DIFFERENTIAL (CANCER CENTER ONLY)
Abs Immature Granulocytes: 0.03 10*3/uL (ref 0.00–0.07)
Basophils Absolute: 0 10*3/uL (ref 0.0–0.1)
Basophils Relative: 0 %
Eosinophils Absolute: 0 10*3/uL (ref 0.0–0.5)
Eosinophils Relative: 0 %
HCT: 28.7 % — ABNORMAL LOW (ref 39.0–52.0)
Hemoglobin: 9.2 g/dL — ABNORMAL LOW (ref 13.0–17.0)
Immature Granulocytes: 1 %
Lymphocytes Relative: 11 %
Lymphs Abs: 0.4 10*3/uL — ABNORMAL LOW (ref 0.7–4.0)
MCH: 30.1 pg (ref 26.0–34.0)
MCHC: 32.1 g/dL (ref 30.0–36.0)
MCV: 93.8 fL (ref 80.0–100.0)
Monocytes Absolute: 0.5 10*3/uL (ref 0.1–1.0)
Monocytes Relative: 14 %
Neutro Abs: 2.5 10*3/uL (ref 1.7–7.7)
Neutrophils Relative %: 74 %
Platelet Count: 63 10*3/uL — ABNORMAL LOW (ref 150–400)
RBC: 3.06 MIL/uL — ABNORMAL LOW (ref 4.22–5.81)
RDW: 15.4 % (ref 11.5–15.5)
WBC Count: 3.4 10*3/uL — ABNORMAL LOW (ref 4.0–10.5)
nRBC: 0 % (ref 0.0–0.2)

## 2018-12-15 LAB — CMP (CANCER CENTER ONLY)
ALT: 11 U/L (ref 0–44)
AST: 15 U/L (ref 15–41)
Albumin: 3.8 g/dL (ref 3.5–5.0)
Alkaline Phosphatase: 77 U/L (ref 38–126)
Anion gap: 8 (ref 5–15)
BUN: 6 mg/dL — ABNORMAL LOW (ref 8–23)
CO2: 30 mmol/L (ref 22–32)
Calcium: 10.7 mg/dL — ABNORMAL HIGH (ref 8.9–10.3)
Chloride: 101 mmol/L (ref 98–111)
Creatinine: 0.7 mg/dL (ref 0.61–1.24)
GFR, Est AFR Am: >60 mL/min (ref >60)
GFR, Estimated: >60 mL/min (ref >60)
Glucose, Bld: 108 mg/dL — ABNORMAL HIGH (ref 70–99)
Potassium: 3.7 mmol/L (ref 3.5–5.1)
Sodium: 139 mmol/L (ref 135–145)
Total Bilirubin: 0.5 mg/dL (ref 0.3–1.2)
Total Protein: 7.4 g/dL (ref 6.5–8.1)

## 2018-12-15 MED ORDER — HEPARIN SOD (PORK) LOCK FLUSH 100 UNIT/ML IV SOLN
250.0000 [IU] | Freq: Once | INTRAVENOUS | Status: AC | PRN
  Administered 2018-12-15: 250 [IU]
  Filled 2018-12-15: qty 5

## 2018-12-15 MED ORDER — SODIUM CHLORIDE 0.9% FLUSH
10.0000 mL | Freq: Once | INTRAVENOUS | Status: AC
  Administered 2018-12-15: 10 mL
  Filled 2018-12-15: qty 10

## 2018-12-15 NOTE — Progress Notes (Signed)
HEMATOLOGY/ONCOLOGY CLINIC NOTE  Date of Service: 12/15/2018  Patient Care Team: Patient, No Pcp Per as PCP - General (General Practice)  CHIEF COMPLAINTS/PURPOSE OF CONSULTATION:   Continued management of Multiple Myeloma  Oncologic History:   KHRYSTIAN [name] was diagnosed with multiple myeloma in July 2018 in [place]. He initially developed mid back pain in May 2018 and was seen to have a T10 vertebral body mass with additional lytic lesion at T9. His initial M spike in July 2018 was 1.5g with IgG Lambda specificity and IgG elevated at 2273m. His July 2018 BM Bx revealed 20% monoclonal plasma cells and FISH revealed a 17p deletion. He received 4 fractions of palliative RT, however his back pain worsened and developed bilateral lower extremity numbness and tingling, and was then seen to have a T10 pathologic fracture with cord compression s/p T10 corpectomy. The pt began VRd on 09/24/16, completed 7 cycles, then [facility], post transplant complicated by neutropenic colitis and deconditioning. Repeat BM Bx on 04/21/17 revealed residual plasma cell with less than 5% lambda light chain restricted plasma cells, and a post-transplant M spike of 0.4g. The pt then began maintenance 537mm2 Carfilzomib every 2 weeks on 06/16/17.  HISTORY OF PRESENTING ILLNESS:   Jesus DELAROSAs a [age] male who has been referred to usKore[place] Dr. BePhil Doppor evaluation and management of Multiple Myeloma. The pt reports that he is doing well overall.  The [place], the pt has moved back to his home which is here locally, and is transferring his care here now as well. The pt notes that he is anticipating C11D1 maintenance Carfilzomib today. The pt notes that his most recent  M spike was 0.2g. His last BM biopsy was March 2019, as noted above. He denies any infection issues in the last year. The pt notes that he has received all of is post-transplant vaccinations. [facility]  The pt reports that he continues to have back pain. He has been receiving Zometa every 4 weeks, but was recently transitioned to every 3 months, he denies dental concerns at this time. He is taking Calcium and Vitamin D replacement.   The pt has been taking Marinol for appetite stimulation.   The pt notes that he has an enlarged prostate, which was seen on imaging. He notes that he was expecting to receive a PSA test soon.  The pt notes that prior to his Multiple Myeloma diagnosis in July 2018, he had a "clean bill of health." He denies DM, HTN, lung problems, heart problems, thyroid problems or other medical concerns.  Most recent lab results (03/23/18) of CBC is as follows: all values are WNL except for RBC at 4.03, HGB at 11.5, HCT at 35.6%, MPV at 8.9, Glucose at 135, Total Protein at 6.2.  On review of systems, pt reports chronic back pain, stable energy levels, and denies concerns for infections, new pain along the spine, abdominal pains, leg swelling, and any other symptoms.   On PMHx the pt reports Multiple Myeloma, denies HTN, HLD, lung problems, strokes, seizures, heart problems and thyroid problems. On Social Hx the pt reports working for the [employer] Interval History:  Jesus [name] returns today for management and evaluation of his multiple myeloma not in remission. The patient's last visit with [place] was on 12/02/2018. The pt reports that he is doing well overall.  The pt reports final treatment of radiation is on November 13th. He no longer has medical insurance and is applying for medicaid. Notes improvement in his neck pain with RT and healing from surgery.  Lab results today (12/15/18) of CBC w/diff and  CMP is as follows: all values are WNL except for WBC Count at 3.4, RBC at 3.06, Hemoglobin at 9.2, HCT at 28.7, Platelet Count at 63, Lymphs Abs at 0.4, Glucose Bld at 108, Bun at 6, Calcium at 10.7.   On review of systems, pt reports neck pain level at a 2, and denies leg swelling and any other symptoms.      MEDICAL HISTORY:  Past Medical History:  Diagnosis Date   Arthritis    Cancer ([place])    multiple myeloma   Cervical stenosis of spinal canal    Wears glasses     SURGICAL HISTORY: Past Surgical History:  Procedure Laterality Date   ANTERIOR CERVICAL CORPECTOMY N/A 11/04/2018   Procedure: Cervical three Corpectomy with Cervical two to Cervical four Plating;  Surgeon: Erline Levine, MD;  Location: [facility];  Service: Neurosurgery;  Laterality: N/A;   APPENDECTOMY     BACK SURGERY     EYE SURGERY Bilateral    cataract surgery with lens implants   HERNIA REPAIR Left    inguinal    IR IMAGING GUIDED PORT INSERTION  06/21/2018   ROTATOR CUFF REPAIR Left     SOCIAL HISTORY: Social History   Socioeconomic History   Marital status: Married    Spouse name: Not on file   Number of children: Not on file   Years of education: Not on file   Highest education level: Not on file  Occupational History   Not on file  Social Needs   Financial resource strain: Not on file   Food insecurity    Worry: Not on file    Inability: Not on file   Transportation needs    Medical: No    Non-medical: No  Tobacco Use   Smoking status: Never Smoker   Smokeless tobacco: Never Used  Substance and Sexual Activity   Alcohol use: Never    Frequency: Never   Drug use: Never   Sexual activity: Not on file  Lifestyle   Physical activity    Days per week: Not on file    Minutes per session: Not on file   Stress: Not on file  Relationships   Social connections    Talks on phone: Not on file    Gets together: Not on file    Attends religious service: Not on file     Active member of club or organization: Not on file    Attends meetings of clubs or organizations: Not on file    Relationship status: Not on file   Intimate partner violence    Fear of current or ex partner: Not on file    Emotionally abused: Not on file    Physically abused: Not on file    Forced sexual activity: Not on file  Other Topics Concern   Not on file  Social History Narrative   Not on file    FAMILY HISTORY: Family History  Problem Relation Age of Onset   Breast cancer Sister     ALLERGIES:  is  allergic to penicillins.  MEDICATIONS:  Current Outpatient Medications  Medication Sig Dispense Refill   acetaminophen (TYLENOL) 325 MG tablet Take 325-650 mg by mouth every 6 (six) hours as needed (pain.).      acyclovir (ZOVIRAX) 400 MG tablet Take 1 tablet (400 mg total) by mouth 2 (two) times daily. 60 tablet 2   aspirin 81 MG EC tablet Take 81 mg by mouth daily.      Calcium Carb-Cholecalciferol (CALCIUM 600+D3) 600-800 MG-UNIT TABS Take 1 tablet by mouth daily.     dexamethasone (DECADRON) 4 MG tablet Take 5 tablets (41m) with breakfast the day after each dose of Daratumumab (Patient taking differently: Take 20 mg by mouth See admin instructions. Take 5 tablets (244m with breakfast the day after each dose of Daratumumab) 120 tablet 1   dronabinol (MARINOL) 10 MG capsule TAKE 1 CAPSULE BY MOUTH TWICE DAILY BEFORE MEAL(S) 60 capsule 0   ergocalciferol (VITAMIN D2) 1.25 MG (50000 UT) capsule Take 1 capsule (50,000 Units total) by mouth 2 (two) times a week. 24 capsule 6   gabapentin (NEURONTIN) 300 MG capsule Take 1 capsule (300 mg total) by mouth 3 (three) times daily. 90 capsule 2   lidocaine-prilocaine (EMLA) cream Apply to affected area once (Patient not taking: Reported on 11/17/2018) 30 g 3   methocarbamol (ROBAXIN) 500 MG tablet Take 1 tablet (500 mg total) by mouth every 6 (six) hours as needed for muscle spasms. 60 tablet 0   morphine (MS CONTIN) 30 MG  12 hr tablet Take 1 tablet (30 mg total) by mouth every 12 (twelve) hours. 60 tablet 0   ondansetron (ZOFRAN) 4 MG tablet Take 1 tablet (4 mg total) by mouth every 8 (eight) hours as needed for nausea or vomiting. 30 tablet 0   ondansetron (ZOFRAN) 8 MG tablet Take 1 tablet (8 mg total) by mouth 2 (two) times daily as needed (Nausea or vomiting). (Patient not taking: Reported on 10/27/2018) 30 tablet 1   oxyCODONE (ROXICODONE) 15 MG immediate release tablet Take 1 tablet (15 mg total) by mouth every 4 (four) hours as needed for severe pain. 120 tablet 0   POMALYST 2 MG capsule TAKE 1 CAPSULE BY MOUTH  DAILY FOR 21 DAYS, THEN 7  DAYS OFF TAKE WITH WATER (Patient not taking: TAKE 1 CAPSULE (2 MG) BY MOUTHDAILY FOR 21 DAYS, THEN 7DAYS OFF) 21 capsule 0   prochlorperazine (COMPAZINE) 10 MG tablet Take 1 tablet (10 mg total) by mouth every 6 (six) hours as needed (Nausea or vomiting). (Patient not taking: Reported on 10/27/2018) 30 tablet 1   senna-docusate (SENOKOT-S) 8.6-50 MG tablet Take 1 tablet by mouth daily.      sucralfate (CARAFATE) 1 g tablet Take 1 tablet (1 g total) by mouth 4 (four) times daily. 120 tablet 0   No current facility-administered medications for this visit.     REVIEW OF SYSTEMS:    A 10+ POINT REVIEW OF SYSTEMS WAS OBTAINED including neurology, dermatology, psychiatry, cardiac, respiratory, lymph, extremities, GI, GU, Musculoskeletal, constitutional, breasts, reproductive, HEENT.  All pertinent positives are noted in the HPI.  All others are negative.      PHYSICAL EXAMINATION: ECOG FS:2 - Symptomatic, <50% confined to bed  Vitals:   12/15/18 1026  BP: (!) 144/82  Pulse: 96  Resp: 18  Temp: 98 F (36.7 C)  SpO2: 100%   Wt Readings from Last 3 Encounters:  12/15/18 118 lb 9.6 oz (53.8 kg)  12/02/18 120 lb 12.8 oz (54.8  kg)  11/18/18 120 lb (54.4 kg)   Body mass index is 18.58 kg/m.    GENERAL:alert, in no acute distress and comfortable SKIN: no  acute rashes, no significant lesions EYES: conjunctiva are pink and non-injected, sclera anicteric OROPHARYNX: MMM, no exudates, no oropharyngeal erythema or ulceration NECK: supple, no JVD LYMPH:  no palpable lymphadenopathy in the cervical, axillary or inguinal regions LUNGS: clear to auscultation b/l with normal respiratory effort HEART: regular rate & rhythm ABDOMEN:  normoactive bowel sounds , non tender, not distended. Extremity: no pedal edema PSYCH: alert & oriented x 3 with fluent speech NEURO: no focal motor/sensory deficits     LABORATORY DATA:  I have reviewed the data as listed  . CBC Latest Ref Rng & Units 12/15/2018 12/02/2018 11/18/2018  WBC 4.0 - 10.5 K/uL 3.4(L) 2.6(L) 4.5  Hemoglobin 13.0 - 17.0 g/dL 9.2(L) 9.9(L) 10.5(L)  Hematocrit 39.0 - 52.0 % 28.7(L) 30.2(L) 32.0(L)  Platelets 150 - 400 K/uL 63(L) 105(L) 153    . CMP Latest Ref Rng & Units 12/15/2018 12/02/2018 11/18/2018  Glucose 70 - 99 mg/dL 108(H) 111(H) 97  BUN 8 - 23 mg/dL 6(L) 5(L) 13  Creatinine 0.61 - 1.24 mg/dL 0.70 0.69 0.69  Sodium 135 - 145 mmol/L 139 138 141  Potassium 3.5 - 5.1 mmol/L 3.7 3.6 3.8  Chloride 98 - 111 mmol/L 101 101 104  CO2 22 - 32 mmol/L _0 Calcium 8.9 - 10.3 mg/dL 10.7(H) 9.8 9.7  Total Protein 6.5 - 8.1 g/dL 7.4 6.7 6.9  Total Bilirubin 0.3 - 1.2 mg/dL 0.5 0.3 0.3  Alkaline Phos 38 - 126 U/L 77 80 102  AST 15 - 41 U/L 15 14(L) 13(L)  ALT 0 - 44 U/L _1 10/02/2020Cervical three Corpectomy with Cervical two to Cervical four Plating (N/A) with PEEK cage, DBM   11/04/2018 ([id number]) DG Cervical Spine 1 View   10/26/2018 ([id number])YP CERVICAL SPINE W WO CONTRAST  06/30/18 BM Biopsy:    08/22/2018 Bone Marrow Biopsy     RADIOGRAPHIC STUDIES: I have personally reviewed the radiological images as listed and agreed with the findings in the report. No results found.  ASSESSMENT & PLAN:  63 y.o. male with  1. Multiple Myeloma - high risk with  17p deletion July 2018 BM Bx revealed 20% monoclonal plasma cells July 2018 Cytogenetics revealed a 17p deletion July 2018 Initial M spike at 1.5g with IgG Lambda specificity, K:L ratio of 0.27. IgG at 2220. 08/11/16 PET/CT revealed Hypermetabolic large soft tissue mass in the lower thoracic paraspinal region associated with lytic destruction of T10 vertebral body, concerning for multiple myeloma. Additional smaller lytic lesions involving the skeleton. S/p RT x 4 fractions, discontinued due to T10 pathologic fracture with severe cord compression S/p 08/26/16 T10 corpectomy and T8-L1 posterior spinal fusion   Began 7 cycles of VRd on 09/24/16 S/p autologous stem cell transplant, Day 0 on 02/23/17 04/21/17 BM Bx with residual less than 5% lambda light chain restricted plasma cells in the bone marrow. M Spike at 0.4g Began maintenance 58m/m2 Carfilzomib every 2 weeks on 06/16/17  06/09/18 PET/CT revealed "Multifocal hypermetabolic lytic lesions in the skeleton as noted above, compatible with active myeloma. In the spine, the most notable active lesion of concern is at the T12 level with there is a large right eccentric vertebral body lesion with demineralization of the cortex and likely some paravertebral extension of tumor. This could cause loosening of the right pedicle screw. Intraspinal extension  of tumor is difficult to exclude. MRI might be considered although the posterolateral rod and pedicle screws may cause artifact. 2. There is likewise cortical breakthrough associated with the lytic lesion along the left upper acetabulum. 3. A lesion in the left intertrochanteric area of the femur is large enough to potentially cause biomechanical weakening which increases risk of fracture. 4. Other skeletal sites of active involvement are detailed above in the skeleton section. 5. Several lucent lesions are observed including the calvarium, T1 vertebral body, and left L4 pedicle which are not hypermetabolic and may  represent effectively previously treated lesions. 6. 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 There is also a lipoma along the right brachialis muscle. 7. Nonspecific subcutaneous edema especially in the right forearm but also extending up into the right upper arm. Cause is uncertain. There is some low-grade nonfocal metabolic activity along this area. Strictly speaking, cellulitis is not excluded, correlate with clinical history and visual inspection. 8. Other imaging findings of potential clinical significance: Aortic Atherosclerosis. Mild cardiomegaly. Cholelithiasis. Prostatomegaly."  06/17/18 MMP revealed M Protein at 0.5g, just before beginning C1 Daratumumab  06/30/18 BM Biopsy revealed normocellular marrow with minimal involvement by plasma cell neoplasm (5% plasma cells), normal cytogenetics.  06/30/18 Molecular Cytogenetics did not have enough material for testing.  10/13/2018 PET whole body revealed "1. Mixed response to therapy with some lesions decreased significantly in metabolic activity and others increased in metabolic activity as well size on the CT portion exam. 2. Expansion of lytic lesion at C3 with loss of a large portion of the vertebral body structure and intense metabolic activity. Recommend non emergent neuro surgical consultation for evaluation of this destructive C3 lesion. Dedicated CT or MRI of the neck may also provide more information. 3. Increase in size and metabolic activity of RIGHT iliac lesion and T5 vertebral body lesion. 4. Marked improvement with reduction in metabolic activity of RIGHT humerus and scapular metastatic lesions as well as marked reduction in metabolic activity large LEFT iliac bone lesion. 5. No evidence of soft tissue plasmacytoma or metastasis."  PLAN: -Discussed pt labwork today, 12/15/18; CBC w/diff and CMP is as follows: all values are WNL except for WBC Count at 3.4, RBC at  3.06, Hemoglobin at 9.2, HCT at 28.7, Platelet Count at 63, Lymphs Abs at 0.4 CMP- reviewed mildhypercalcemia 10.7.  -Discussed he is slightly anemic and platelets are low. -Discussed that Dr.Rodriguez completed labs and is waiting for results to determine clinical trails available. If trials are available we will stop chemotherapy, until then we will continue chemotherapy. -Discussed that we will hold on pomalidomide and continue Daratumumab every two weeks. Restart bone medication zometa tomorrow.  FOLLOW UP: Daratumumab and Zometa as scheduled tomorrow Continue Daratumumab q2weeks with port flush and labs - plz schedule next 4 doses Continue Zometa q4weeks (next dose tomorrow) MD visit in 4 weeks    The total time spent in the appt was 25 minutes and more than 50% was on counseling and direct patient cares.  All of the patient's questions were answered with apparent satisfaction. The patient knows to call the clinic with any problems, questions or concerns.     Sullivan Lone MD [facility] AAHIVMS [facility] Hematology/Oncology Physician [facility]  (Office):       [phone] (Work cell):  [phone] (Fax):           [phone]  12/15/2018 11:08 AM  I,  Aara McKoy, am acting as a scribe for Dr. Sullivan Lone.   .I have reviewed the above documentation for accuracy and completeness, and I agree with the above. Brunetta Genera MD

## 2018-12-15 NOTE — Telephone Encounter (Signed)
Scheduled appt per 11/12 los.

## 2018-12-16 ENCOUNTER — Other Ambulatory Visit: Payer: 59

## 2018-12-16 ENCOUNTER — Inpatient Hospital Stay: Payer: Self-pay

## 2018-12-16 ENCOUNTER — Ambulatory Visit
Admission: RE | Admit: 2018-12-16 | Discharge: 2018-12-16 | Disposition: A | Discharge status: Home / Self Care | Payer: Self-pay | Source: Ambulatory Visit | Attending: Radiation Oncology | Admitting: Radiation Oncology

## 2018-12-16 ENCOUNTER — Other Ambulatory Visit: Payer: Self-pay

## 2018-12-16 ENCOUNTER — Ambulatory Visit: Payer: 59 | Admitting: Hematology

## 2018-12-16 ENCOUNTER — Other Ambulatory Visit: Payer: Self-pay | Admitting: Hematology

## 2018-12-16 VITALS — BP 155/85 | HR 97 | Temp 98.4°F | Resp 18

## 2018-12-16 DIAGNOSIS — Z95828 Presence of other vascular implants and grafts: Secondary | ICD-10-CM

## 2018-12-16 DIAGNOSIS — Z7189 Other specified counseling: Secondary | ICD-10-CM

## 2018-12-16 DIAGNOSIS — C9000 Multiple myeloma not having achieved remission: Secondary | ICD-10-CM

## 2018-12-16 MED ORDER — ACETAMINOPHEN 325 MG PO TABS
ORAL_TABLET | ORAL | Status: AC
  Filled 2018-12-16: qty 2

## 2018-12-16 MED ORDER — SODIUM CHLORIDE 0.9 % IV SOLN
20.0000 mg | Freq: Once | INTRAVENOUS | Status: AC
  Administered 2018-12-16: 20 mg via INTRAVENOUS
  Filled 2018-12-16: qty 2

## 2018-12-16 MED ORDER — SODIUM CHLORIDE 0.9% FLUSH
10.0000 mL | INTRAVENOUS | Status: DC | PRN
  Administered 2018-12-16: 10 mL
  Filled 2018-12-16: qty 10

## 2018-12-16 MED ORDER — HEPARIN SOD (PORK) LOCK FLUSH 100 UNIT/ML IV SOLN
500.0000 [IU] | Freq: Once | INTRAVENOUS | Status: AC | PRN
  Administered 2018-12-16: 500 [IU]
  Filled 2018-12-16: qty 5

## 2018-12-16 MED ORDER — ACETAMINOPHEN 325 MG PO TABS
650.0000 mg | ORAL_TABLET | Freq: Once | ORAL | Status: AC
  Administered 2018-12-16: 650 mg via ORAL

## 2018-12-16 MED ORDER — DIPHENHYDRAMINE HCL 25 MG PO CAPS
50.0000 mg | ORAL_CAPSULE | Freq: Once | ORAL | Status: AC
  Administered 2018-12-16: 50 mg via ORAL

## 2018-12-16 MED ORDER — FAMOTIDINE IN NACL 20-0.9 MG/50ML-% IV SOLN
20.0000 mg | Freq: Once | INTRAVENOUS | Status: AC
  Administered 2018-12-16: 20 mg via INTRAVENOUS

## 2018-12-16 MED ORDER — SODIUM CHLORIDE 0.9 % IV SOLN
Freq: Once | INTRAVENOUS | Status: AC
  Administered 2018-12-16: 10:00:00 via INTRAVENOUS
  Filled 2018-12-16: qty 250

## 2018-12-16 MED ORDER — FAMOTIDINE IN NACL 20-0.9 MG/50ML-% IV SOLN
INTRAVENOUS | Status: AC
  Filled 2018-12-16: qty 50

## 2018-12-16 MED ORDER — ZOLEDRONIC ACID 4 MG/100ML IV SOLN
4.0000 mg | Freq: Once | INTRAVENOUS | Status: AC
  Administered 2018-12-16: 4 mg via INTRAVENOUS
  Filled 2018-12-16: qty 100

## 2018-12-16 MED ORDER — SODIUM CHLORIDE 0.9 % IV SOLN
15.6000 mg/kg | Freq: Once | INTRAVENOUS | Status: AC
  Administered 2018-12-16: 900 mg via INTRAVENOUS
  Filled 2018-12-16: qty 40

## 2018-12-16 MED ORDER — DIPHENHYDRAMINE HCL 25 MG PO CAPS
ORAL_CAPSULE | ORAL | Status: AC
  Filled 2018-12-16: qty 2

## 2018-12-16 NOTE — Progress Notes (Signed)
Dara orders signed

## 2018-12-16 NOTE — Patient Instructions (Signed)
[facility] Discharge Instructions for Patients Receiving Chemotherapy  Today you received the following chemotherapy agents: Darzalex Also received Zometa  To help prevent nausea and vomiting after your treatment, we encourage you to take your nausea medication as directed.   If you develop nausea and vomiting that is not controlled by your nausea medication, call the clinic.   BELOW ARE SYMPTOMS THAT SHOULD BE REPORTED IMMEDIATELY:  *FEVER GREATER THAN 100.5 F  *CHILLS WITH OR WITHOUT FEVER  NAUSEA AND VOMITING THAT IS NOT CONTROLLED WITH YOUR NAUSEA MEDICATION  *UNUSUAL SHORTNESS OF BREATH  *UNUSUAL BRUISING OR BLEEDING  TENDERNESS IN MOUTH AND THROAT WITH OR WITHOUT PRESENCE OF ULCERS  *URINARY PROBLEMS  *BOWEL PROBLEMS  UNUSUAL RASH Items with * indicate a potential emergency and should be followed up as soon as possible.  Feel free to call the clinic should you have any questions or concerns. The clinic phone number is [phone].  Please show the [facility] [place] at check-in to the Emergency Department and triage nurse.

## 2018-12-19 ENCOUNTER — Ambulatory Visit: Payer: Self-pay

## 2018-12-19 ENCOUNTER — Other Ambulatory Visit: Payer: Self-pay

## 2018-12-19 ENCOUNTER — Ambulatory Visit
Admission: RE | Admit: 2018-12-19 | Discharge: 2018-12-19 | Disposition: A | Discharge status: Home / Self Care | Payer: Self-pay | Source: Ambulatory Visit | Attending: Radiation Oncology | Admitting: Radiation Oncology

## 2018-12-20 ENCOUNTER — Encounter: Payer: Self-pay | Admitting: Radiation Oncology

## 2018-12-20 ENCOUNTER — Ambulatory Visit
Admission: RE | Admit: 2018-12-20 | Discharge: 2018-12-20 | Disposition: A | Discharge status: Home / Self Care | Payer: Self-pay | Source: Ambulatory Visit | Attending: Radiation Oncology | Admitting: Radiation Oncology

## 2018-12-26 ENCOUNTER — Other Ambulatory Visit: Payer: Self-pay

## 2018-12-26 ENCOUNTER — Emergency Department
Admission: EM | Admit: 2018-12-26 | Discharge: 2018-12-26 | Disposition: A | Discharge status: Home / Self Care | Payer: Self-pay | Attending: Emergency Medicine | Admitting: Emergency Medicine

## 2018-12-26 ENCOUNTER — Encounter: Payer: Self-pay | Admitting: Emergency Medicine

## 2018-12-26 DIAGNOSIS — R040 Epistaxis: Secondary | ICD-10-CM | POA: Insufficient documentation

## 2018-12-26 DIAGNOSIS — Z7982 Long term (current) use of aspirin: Secondary | ICD-10-CM | POA: Insufficient documentation

## 2018-12-26 DIAGNOSIS — Z79899 Other long term (current) drug therapy: Secondary | ICD-10-CM | POA: Insufficient documentation

## 2018-12-26 DIAGNOSIS — Z8579 Personal history of other malignant neoplasms of lymphoid, hematopoietic and related tissues: Secondary | ICD-10-CM | POA: Insufficient documentation

## 2018-12-26 MED ORDER — OXYMETAZOLINE HCL 0.05 % NA SOLN
2.0000 | Freq: Once | NASAL | Status: AC
  Administered 2018-12-26: 2 via NASAL

## 2018-12-26 MED ORDER — TRANEXAMIC ACID 1000 MG/10ML IV SOLN
500.0000 mg | Freq: Once | INTRAVENOUS | Status: AC
  Administered 2018-12-26: 500 mg via TOPICAL
  Filled 2018-12-26: qty 10

## 2018-12-26 NOTE — Discharge Instructions (Addendum)
Follow discharge care instruction use nasal clamp for the next 2 hours as needed.  D.  2 sprays of Afrin left nostril 8 to 12 hours apart.  Do not use nasal spray for more than 3 days.

## 2018-12-26 NOTE — ED Provider Notes (Signed)
[facility] Emergency Department Provider Note   ____________________________________________   First MD Initiated Contact with Patient 12/26/18 1041     (approximate)  I have reviewed the triage vital signs and the nursing notes.   HISTORY  Chief Complaint Epistaxis    HPI Jesus [name] is a 63 y.o. male patient presents with bleeding from the left nostril.  Patient arrived via EMS with a paper towel plug into the left nostril.  Patient denies use of aspirin or blood thinners.  Patient denies trauma.  Bleeding occurred status post awakening blowing nose.         Past Medical History:  Diagnosis Date  . Arthritis   . Cancer ([place])    multiple myeloma  . Cervical stenosis of spinal canal   . Wears glasses     Patient Active Problem List   Diagnosis Date Noted  . Multiple myeloma ([place]) 11/04/2018  . Port-A-Cath in place 07/22/2018  . Mass of left thigh 06/23/2018  . Bone lesion 06/20/2018  . History of colon polyps 06/20/2018  . Nuclear cataract 06/20/2018  . Counseling regarding advance care planning and goals of care 04/20/2018  . 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 Myelopathy of thoracic region 09/01/2016  . S/P spinal fusion 08/26/2016  . 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 Multiple myeloma not having achieved remission ([place]) 08/17/2016  . Multiple myeloma in remission ([place]) 08/17/2016  . 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 Family history of prostate cancer 02/16/2012  . Hypercholesteremia 02/16/2012  . Benign prostatic hyperplasia with urinary obstruction 03/28/2009  . Encounter for screening for cardiovascular disorders 06/10/2008  . Erectile dysfunction 02/10/2006    Past Surgical History:  Procedure Laterality Date  . ANTERIOR CERVICAL CORPECTOMY N/A 11/04/2018   Procedure: Cervical three Corpectomy with Cervical two to Cervical four Plating;  Surgeon: Erline Levine, MD;  Location: [facility];   Service: Neurosurgery;  Laterality: N/A;  . APPENDECTOMY    . BACK SURGERY    . EYE SURGERY Bilateral    cataract surgery with lens implants  . HERNIA REPAIR Left    inguinal   . IR IMAGING GUIDED PORT INSERTION  06/21/2018  . ROTATOR CUFF REPAIR Left     Prior to Admission medications   Medication Sig Start Date End Date Taking? Authorizing Provider  acetaminophen (TYLENOL) 325 MG tablet Take 325-650 mg by mouth every 6 (six) hours as needed (pain.).  09/07/16   [provider]  acyclovir (ZOVIRAX) 400 MG tablet Take 1 tablet (400 mg total) by mouth 2 (two) times daily. 10/21/18   Brunetta Genera, MD  aspirin 81 MG EC tablet Take 81 mg by mouth daily.     [provider]  Calcium Carb-Cholecalciferol (CALCIUM 600+D3) 600-800 MG-UNIT TABS Take 1 tablet by mouth daily.    [provider]  dexamethasone (DECADRON) 4 MG tablet Take 5 tablets ('20mg'$ ) with breakfast the day after each dose of Daratumumab Patient taking differently: Take 20 mg by mouth See admin instructions. Take 5 tablets ('20mg'$ ) with breakfast the day after each dose of Daratumumab 06/15/18   Brunetta Genera, MD  dronabinol (MARINOL) 10 MG capsule TAKE 1 CAPSULE BY MOUTH TWICE DAILY BEFORE MEAL(S) 11/28/18   Brunetta Genera, MD  ergocalciferol (VITAMIN D2) 1.25 MG (50000 UT) capsule Take 1 capsule (50,000 Units total) by mouth 2 (two) times a week. 09/29/18   Brunetta Genera, MD  gabapentin (NEURONTIN) 300 MG capsule Take  1 capsule (300 mg total) by mouth 3 (three) times daily. 11/07/18   Erline Levine, MD  methocarbamol (ROBAXIN) 500 MG tablet Take 1 tablet (500 mg total) by mouth every 6 (six) hours as needed for muscle spasms. 11/07/18   Erline Levine, MD  morphine (MS CONTIN) 30 MG 12 hr tablet Take 1 tablet (30 mg total) by mouth every 12 (twelve) hours. 11/29/18   Hayden Pedro, PA-C  ondansetron (ZOFRAN) 4 MG tablet Take 1 tablet (4 mg total) by mouth every 8 (eight) hours as  needed for nausea or vomiting. 04/20/18   Brunetta Genera, MD  oxyCODONE (ROXICODONE) 15 MG immediate release tablet Take 1 tablet (15 mg total) by mouth every 4 (four) hours as needed for severe pain. 11/29/18   Hayden Pedro, PA-C  senna-docusate (SENOKOT-S) 8.6-50 MG tablet Take 1 tablet by mouth daily.  08/31/16   [provider]  sucralfate (CARAFATE) 1 g tablet Take 1 tablet (1 g total) by mouth 4 (four) times daily. 07/22/18   Kyung Rudd, MD    Allergies Penicillins  Family History  Problem Relation Age of Onset  . Breast cancer Sister     Social History Social History   Tobacco Use  . Smoking status: Never Smoker  . Smokeless tobacco: Never Used  Substance Use Topics  . Alcohol use: Never    Frequency: Never  . Drug use: Never    Review of Systems Constitutional: No fever/chills Eyes: No visual changes. ENT: No sore throat.  Left nostril epistaxis Cardiovascular: Denies chest pain. Respiratory: Denies shortness of breath. Gastrointestinal: No abdominal pain.  No nausea, no vomiting.  No diarrhea.  No constipation. Genitourinary: Negative for dysuria. Musculoskeletal: Negative for back pain. Skin: Negative for rash. Neurological: Negative for headaches, focal weakness or numbness. Endocrine:  Hyperlipidemia Allergic/Immunilogical: Penicillin ____________________________________________   PHYSICAL EXAM:  VITAL SIGNS: ED Triage Vitals  Enc Vitals Group     BP 12/26/18 0956 (!) 112/91     Pulse Rate 12/26/18 0956 (!) 106     Resp 12/26/18 0956 16     Temp 12/26/18 0956 98.3 F (36.8 C)     Temp Source 12/26/18 0956 Oral     SpO2 12/26/18 0956 100 %     Weight --      Height --      Head Circumference --      Peak Flow --      Pain Score 12/26/18 1000 0     Pain Loc --      Pain Edu? --      Excl. in [place]? --    Constitutional: Alert and oriented. Well appearing and in no acute distress. Nose: No congestion/rhinnorhea.  Slight  hemorrhage in left nostril with clot. Mouth/Throat: Mucous membranes are moist.  Oropharynx non-erythematous.  Postnasal drainage mixed with blood. Neck: No stridor. No cervical spine tenderness to palpation. Hematological/Lymphatic/Immunilogical: No cervical lymphadenopathy. Cardiovascular: Tachycardic regular rhythm. Grossly normal heart sounds.  Good peripheral circulation. Respiratory: Normal respiratory effort.  No retractions. Lungs CTAB. Neurologic:  Normal speech and language. No gross focal neurologic deficits are appreciated. No gait instability. Skin:  Skin is warm, dry and intact. No rash noted. Psychiatric: Mood and affect are normal. Speech and behavior are normal.  ____________________________________________   LABS (all labs ordered are listed, but only abnormal results are displayed)  Labs Reviewed - No data to display ____________________________________________  EKG   ____________________________________________  RADIOLOGY  ED MD interpretation:    Official  radiology report(s): No results found.  ____________________________________________   PROCEDURES  Procedure(s) performed (including Critical Care):  .Epistaxis Management  Date/Time: 12/26/2018 1:05 PM Performed by: Sable Feil, PA-C Authorized by: Sable Feil, PA-C   Consent:    Consent obtained:  Verbal   Consent given by:  Patient   Risks discussed:  Bleeding, infection and nasal injury Anesthesia (see MAR for exact dosages):    Anesthesia method:  None Procedure details:    Treatment site:  L anterior   Treatment method:  Thrombin   Treatment complexity:  Limited   Treatment episode: initial   Post-procedure details:    Assessment:  Bleeding stopped   Patient tolerance of procedure:  Tolerated well, no immediate complications     ____________________________________________   INITIAL IMPRESSION / ASSESSMENT AND PLAN / ED COURSE  As part of my medical decision making,  I reviewed the following data within the [place]     Patient presents with bleeding from left nostril.  Minimal control using Afrin and nasal compression.  Patient is responded well to Tranexamic impregnated cotton swab.  Patient given discharge care instruction advised return back to ED if condition worsens.    Jesus [name] was evaluated in Emergency Department on 12/26/2018 for the symptoms described in the history of present illness. He was evaluated in the context of the global COVID-19 pandemic, which necessitated consideration that the patient might be at risk for infection with the SARS-CoV-2 virus that causes COVID-19. Institutional protocols and algorithms that pertain to the evaluation of patients at risk for COVID-19 are in a state of rapid change based on information released by regulatory bodies including the CDC and federal and state organizations. These policies and algorithms were followed during the patient's care in the ED.       ____________________________________________   FINAL CLINICAL IMPRESSION(S) / ED DIAGNOSES  Final diagnoses:  Left-sided epistaxis     ED Discharge Orders    None       Note:  This document was prepared using Dragon voice recognition software and may include unintentional dictation errors.    Sable Feil, PA-C 12/26/18 [place]    Blake Divine, MD 12/26/18 [phone]

## 2018-12-26 NOTE — ED Triage Notes (Signed)
Pt in via EMS from home with c/o nosebleed. EMS reports bleeding has subsided.

## 2018-12-26 NOTE — ED Triage Notes (Signed)
Pt reports nosebleed since about 7am. Bleeding controlled at this time. Pt reports that he has no other concerns or issues.

## 2018-12-30 ENCOUNTER — Ambulatory Visit: Payer: 59 | Admitting: Hematology

## 2018-12-30 ENCOUNTER — Telehealth: Payer: Self-pay | Admitting: *Deleted

## 2018-12-30 ENCOUNTER — Inpatient Hospital Stay: Payer: Self-pay

## 2018-12-30 ENCOUNTER — Other Ambulatory Visit: Payer: Self-pay

## 2018-12-30 VITALS — BP 127/81 | HR 85 | Temp 98.1°F | Resp 16 | Ht 67.0 in | Wt 111.0 lb

## 2018-12-30 DIAGNOSIS — Z7189 Other specified counseling: Secondary | ICD-10-CM

## 2018-12-30 DIAGNOSIS — Z95828 Presence of other vascular implants and grafts: Secondary | ICD-10-CM

## 2018-12-30 DIAGNOSIS — C9000 Multiple myeloma not having achieved remission: Secondary | ICD-10-CM

## 2018-12-30 LAB — CMP (CANCER CENTER ONLY)
ALT: 13 U/L (ref 0–44)
AST: 23 U/L (ref 15–41)
Albumin: 3.6 g/dL (ref 3.5–5.0)
Alkaline Phosphatase: 74 U/L (ref 38–126)
Anion gap: 9 (ref 5–15)
BUN: 6 mg/dL — ABNORMAL LOW (ref 8–23)
CO2: 27 mmol/L (ref 22–32)
Calcium: 11.3 mg/dL — ABNORMAL HIGH (ref 8.9–10.3)
Chloride: 101 mmol/L (ref 98–111)
Creatinine: 0.76 mg/dL (ref 0.61–1.24)
GFR, Est AFR Am: >60 mL/min (ref >60)
GFR, Estimated: >60 mL/min (ref >60)
Glucose, Bld: 134 mg/dL — ABNORMAL HIGH (ref 70–99)
Potassium: 3.5 mmol/L (ref 3.5–5.1)
Sodium: 137 mmol/L (ref 135–145)
Total Bilirubin: 0.4 mg/dL (ref 0.3–1.2)
Total Protein: 7.5 g/dL (ref 6.5–8.1)

## 2018-12-30 LAB — CBC WITH DIFFERENTIAL (CANCER CENTER ONLY)
Abs Immature Granulocytes: 0.04 10*3/uL (ref 0.00–0.07)
Basophils Absolute: 0 10*3/uL (ref 0.0–0.1)
Basophils Relative: 0 %
Eosinophils Absolute: 0 10*3/uL (ref 0.0–0.5)
Eosinophils Relative: 1 %
HCT: 25.6 % — ABNORMAL LOW (ref 39.0–52.0)
Hemoglobin: 8.5 g/dL — ABNORMAL LOW (ref 13.0–17.0)
Immature Granulocytes: 2 %
Lymphocytes Relative: 23 %
Lymphs Abs: 0.5 10*3/uL — ABNORMAL LOW (ref 0.7–4.0)
MCH: 30.7 pg (ref 26.0–34.0)
MCHC: 33.2 g/dL (ref 30.0–36.0)
MCV: 92.4 fL (ref 80.0–100.0)
Monocytes Absolute: 0.3 10*3/uL (ref 0.1–1.0)
Monocytes Relative: 17 %
Neutro Abs: 1.1 10*3/uL — ABNORMAL LOW (ref 1.7–7.7)
Neutrophils Relative %: 57 %
Platelet Count: 25 10*3/uL — ABNORMAL LOW (ref 150–400)
RBC: 2.77 MIL/uL — ABNORMAL LOW (ref 4.22–5.81)
RDW: 15.2 % (ref 11.5–15.5)
WBC Count: 2 10*3/uL — ABNORMAL LOW (ref 4.0–10.5)
nRBC: 0 % (ref 0.0–0.2)

## 2018-12-30 MED ORDER — SODIUM CHLORIDE 0.9% FLUSH
10.0000 mL | Freq: Once | INTRAVENOUS | Status: AC
  Administered 2018-12-30: 10 mL
  Filled 2018-12-30: qty 10

## 2018-12-30 MED ORDER — FAMOTIDINE IN NACL 20-0.9 MG/50ML-% IV SOLN
INTRAVENOUS | Status: AC
  Filled 2018-12-30: qty 50

## 2018-12-30 MED ORDER — DIPHENHYDRAMINE HCL 25 MG PO CAPS
50.0000 mg | ORAL_CAPSULE | Freq: Once | ORAL | Status: AC
  Administered 2018-12-30: 50 mg via ORAL

## 2018-12-30 MED ORDER — ACETAMINOPHEN 325 MG PO TABS
ORAL_TABLET | ORAL | Status: AC
  Filled 2018-12-30: qty 2

## 2018-12-30 MED ORDER — HEPARIN SOD (PORK) LOCK FLUSH 100 UNIT/ML IV SOLN
500.0000 [IU] | Freq: Once | INTRAVENOUS | Status: AC | PRN
  Administered 2018-12-30: 500 [IU]
  Filled 2018-12-30: qty 5

## 2018-12-30 MED ORDER — ACETAMINOPHEN 325 MG PO TABS
ORAL_TABLET | ORAL | Status: AC
  Filled 2018-12-30: qty 1

## 2018-12-30 MED ORDER — DIPHENHYDRAMINE HCL 25 MG PO CAPS
ORAL_CAPSULE | ORAL | Status: AC
  Filled 2018-12-30: qty 2

## 2018-12-30 MED ORDER — SODIUM CHLORIDE 0.9 % IV SOLN
15.6000 mg/kg | Freq: Once | INTRAVENOUS | Status: AC
  Administered 2018-12-30: 900 mg via INTRAVENOUS
  Filled 2018-12-30: qty 40

## 2018-12-30 MED ORDER — SODIUM CHLORIDE 0.9 % IV SOLN
20.0000 mg | Freq: Once | INTRAVENOUS | Status: AC
  Administered 2018-12-30: 20 mg via INTRAVENOUS
  Filled 2018-12-30: qty 2

## 2018-12-30 MED ORDER — ACETAMINOPHEN 325 MG PO TABS
650.0000 mg | ORAL_TABLET | Freq: Once | ORAL | Status: AC
  Administered 2018-12-30: 650 mg via ORAL

## 2018-12-30 MED ORDER — FAMOTIDINE IN NACL 20-0.9 MG/50ML-% IV SOLN
20.0000 mg | Freq: Once | INTRAVENOUS | Status: AC
  Administered 2018-12-30: 20 mg via INTRAVENOUS

## 2018-12-30 MED ORDER — SODIUM CHLORIDE 0.9 % IV SOLN
Freq: Once | INTRAVENOUS | Status: AC
  Administered 2018-12-30: 12:00:00 via INTRAVENOUS
  Filled 2018-12-30: qty 250

## 2018-12-30 MED ORDER — SODIUM CHLORIDE 0.9% FLUSH
10.0000 mL | INTRAVENOUS | Status: DC | PRN
  Administered 2018-12-30: 10 mL
  Filled 2018-12-30: qty 10

## 2018-12-30 NOTE — Patient Instructions (Signed)
[facility] Discharge Instructions for Patients Receiving Chemotherapy  Today you received the following chemotherapy agents Daratumumab (DARZALEX).  To help prevent nausea and vomiting after your treatment, we encourage you to take your nausea medication as prescribed.  If you develop nausea and vomiting that is not controlled by your nausea medication, call the clinic.   BELOW ARE SYMPTOMS THAT SHOULD BE REPORTED IMMEDIATELY:  *FEVER GREATER THAN 100.5 F  *CHILLS WITH OR WITHOUT FEVER  NAUSEA AND VOMITING THAT IS NOT CONTROLLED WITH YOUR NAUSEA MEDICATION  *UNUSUAL SHORTNESS OF BREATH  *UNUSUAL BRUISING OR BLEEDING  TENDERNESS IN MOUTH AND THROAT WITH OR WITHOUT PRESENCE OF ULCERS  *URINARY PROBLEMS  *BOWEL PROBLEMS  UNUSUAL RASH Items with * indicate a potential emergency and should be followed up as soon as possible.  Feel free to call the clinic should you have any questions or concerns. The clinic phone number is [phone].  Please show the CHEMO ALERT CARD at check-in to the Emergency Department and triage nurse.  Coronavirus (COVID-19) Are you at risk?  Are you at risk for the Coronavirus (COVID-19)?  To be considered HIGH RISK for Coronavirus (COVID-19), you have to meet the following criteria:  . Traveled to [place], [place], [place], [place] or [place]; or in the [place] to [place], [place], [place], or [place]; and have fever, cough, and shortness of breath within the last 2 weeks of travel OR . Been in close contact with a person diagnosed with COVID-19 within the last 2 weeks and have fever, cough, and shortness of breath . IF YOU DO NOT MEET THESE CRITERIA, YOU ARE CONSIDERED LOW RISK FOR COVID-19.  What to do if you are HIGH RISK for COVID-19?  . If you are having a medical emergency, call 911. . Seek medical care right away. Before you go to a doctor's office, urgent care or emergency department, call ahead and tell them  about your recent travel, contact with someone diagnosed with COVID-19, and your symptoms. You should receive instructions from your physician's office regarding next steps of care.  . When you arrive at healthcare provider, tell the healthcare staff immediately you have returned from visiting [place], [place], [place], [place] or [place]; or traveled in the [place] to [place], [place], [place], or [place]; in the last two weeks or you have been in close contact with a person diagnosed with COVID-19 in the last 2 weeks.   . Tell the health care staff about your symptoms: fever, cough and shortness of breath. . After you have been seen by a medical provider, you will be either: o Tested for (COVID-19) and discharged home on quarantine except to seek medical care if symptoms worsen, and asked to  - Stay home and avoid contact with others until you get your results (4-5 days)  - Avoid travel on public transportation if possible (such as bus, train, or airplane) or o Sent to the Emergency Department by EMS for evaluation, COVID-19 testing, and possible admission depending on your condition and test results.  What to do if you are LOW RISK for COVID-19?  Reduce your risk of any infection by using the same precautions used for avoiding the common cold or flu:  . Wash your hands often with soap and warm water for at least 20 seconds.  If soap and water are not readily available, use an alcohol-based hand sanitizer with at least 60% alcohol.  . If coughing or sneezing,   sneezing, cover your mouth and nose by coughing or sneezing into the elbow areas of your shirt or coat, into a tissue or into your sleeve (not your hands). . Avoid shaking hands with others and consider head nods or verbal greetings only. . Avoid touching your eyes, nose, or mouth with unwashed hands.  . Avoid close contact with people who are sick. . Avoid places or events with large numbers of people in one location, like concerts or  sporting events. . Carefully consider travel plans you have or are making. . If you are planning any travel outside or inside the [place], visit the CDC's Travelers' Health webpage for the latest health notices. . If you have some symptoms but not all symptoms, continue to monitor at home and seek medical attention if your symptoms worsen. . If you are having a medical emergency, call 911.   [place] / e-Visit: [email or web address]         [facility]: [facility]: [phone]                   [facility]: [phone]

## 2018-12-30 NOTE — Progress Notes (Signed)
Verbal order from Dr. Shadad: okay to treat patient with current labs. 

## 2018-12-30 NOTE — Telephone Encounter (Signed)
Patient in infusion today. Asked infusion nurse to give message to Dr.Kale: Patient is having increased pain on right side following las radiation. He stated to nurse that pain medication is helping reduce pain. Informed infusion nurse that Dr. Irene Limbo out of office today and will receive information/message on his return. Asked nurse to encourage patient to follow up with Radiation oncologist as pain may be related to radiation.

## 2019-01-03 ENCOUNTER — Encounter: Payer: Self-pay | Admitting: Pharmacy Technician

## 2019-01-03 NOTE — Progress Notes (Signed)
  Radiation Oncology         [phone] ________________________________  Name: Jesus [name] MRN: [id number]  Date: 11/17/2018  DOB: [birth year]  SIMULATION AND TREATMENT PLANNING NOTE  DIAGNOSIS:     ICD-10-CM   1. Multiple myeloma not having achieved remission ([place])  C90.00      Site:   1.  Right pelvis 2.  T-spine: T5 3.  C-spine  NARRATIVE:  The patient was brought to the [facility].  Identity was confirmed.  All relevant records and images related to the planned course of therapy were reviewed.   Written consent to proceed with treatment was confirmed which was freely given after reviewing the details related to the planned course of therapy had been reviewed with the patient.  Then, the patient was set-up in a stable reproducible  supine position for radiation therapy.  CT images were obtained.  Surface markings were placed.    Medically necessary complex treatment device(s) for immobilization:   1.  Thermoplastic mask, accuform device 2.  Vac-lock bag.   The CT images were loaded into the planning software.  Then the target and avoidance structures were contoured.  Treatment planning then occurred.  The radiation prescription was entered and confirmed.  A total of 8 complex treatment devices were fabricated which relate to the designed radiation treatment fields. Each of these customized fields/ complex treatment devices will be used on a daily basis during the radiation course. I have requested : Isodose Plan.   PLAN:  The patient will receive 35 Gy in 14 fractions.  ________________________________   Jodelle Gross, MD, PhD

## 2019-01-11 ENCOUNTER — Other Ambulatory Visit: Payer: Self-pay

## 2019-01-11 ENCOUNTER — Inpatient Hospital Stay (HOSPITAL_COMMUNITY)
Admission: EM | Admit: 2019-01-11 | Discharge: 2019-02-10 | DRG: 640 | Disposition: A | Discharge status: Hospice | Payer: Medicare Other | Attending: Internal Medicine | Admitting: Internal Medicine

## 2019-01-11 ENCOUNTER — Encounter (HOSPITAL_COMMUNITY): Payer: Self-pay | Admitting: Internal Medicine

## 2019-01-11 ENCOUNTER — Telehealth: Payer: Self-pay | Admitting: *Deleted

## 2019-01-11 ENCOUNTER — Other Ambulatory Visit: Payer: Self-pay | Admitting: Hematology

## 2019-01-11 DIAGNOSIS — C9001 Multiple myeloma in remission: Secondary | ICD-10-CM

## 2019-01-11 DIAGNOSIS — R040 Epistaxis: Secondary | ICD-10-CM | POA: Diagnosis present

## 2019-01-11 DIAGNOSIS — Z9841 Cataract extraction status, right eye: Secondary | ICD-10-CM

## 2019-01-11 DIAGNOSIS — K5900 Constipation, unspecified: Secondary | ICD-10-CM | POA: Diagnosis present

## 2019-01-11 DIAGNOSIS — D469 Myelodysplastic syndrome, unspecified: Secondary | ICD-10-CM | POA: Diagnosis present

## 2019-01-11 DIAGNOSIS — E7800 Pure hypercholesterolemia, unspecified: Secondary | ICD-10-CM | POA: Diagnosis present

## 2019-01-11 DIAGNOSIS — E870 Hyperosmolality and hypernatremia: Secondary | ICD-10-CM | POA: Diagnosis not present

## 2019-01-11 DIAGNOSIS — G253 Myoclonus: Secondary | ICD-10-CM | POA: Diagnosis not present

## 2019-01-11 DIAGNOSIS — E871 Hypo-osmolality and hyponatremia: Secondary | ICD-10-CM | POA: Diagnosis not present

## 2019-01-11 DIAGNOSIS — Z515 Encounter for palliative care: Secondary | ICD-10-CM | POA: Diagnosis present

## 2019-01-11 DIAGNOSIS — T380X5A Adverse effect of glucocorticoids and synthetic analogues, initial encounter: Secondary | ICD-10-CM | POA: Diagnosis not present

## 2019-01-11 DIAGNOSIS — Z880 Allergy status to penicillin: Secondary | ICD-10-CM

## 2019-01-11 DIAGNOSIS — Z681 Body mass index (BMI) 19 or less, adult: Secondary | ICD-10-CM

## 2019-01-11 DIAGNOSIS — D696 Thrombocytopenia, unspecified: Secondary | ICD-10-CM | POA: Diagnosis not present

## 2019-01-11 DIAGNOSIS — Z9221 Personal history of antineoplastic chemotherapy: Secondary | ICD-10-CM

## 2019-01-11 DIAGNOSIS — D61818 Other pancytopenia: Secondary | ICD-10-CM | POA: Diagnosis present

## 2019-01-11 DIAGNOSIS — F05 Delirium due to known physiological condition: Secondary | ICD-10-CM | POA: Diagnosis not present

## 2019-01-11 DIAGNOSIS — R410 Disorientation, unspecified: Secondary | ICD-10-CM

## 2019-01-11 DIAGNOSIS — R739 Hyperglycemia, unspecified: Secondary | ICD-10-CM | POA: Diagnosis present

## 2019-01-11 DIAGNOSIS — M47812 Spondylosis without myelopathy or radiculopathy, cervical region: Secondary | ICD-10-CM | POA: Diagnosis present

## 2019-01-11 DIAGNOSIS — Z9842 Cataract extraction status, left eye: Secondary | ICD-10-CM

## 2019-01-11 DIAGNOSIS — M1990 Unspecified osteoarthritis, unspecified site: Secondary | ICD-10-CM | POA: Diagnosis present

## 2019-01-11 DIAGNOSIS — E876 Hypokalemia: Secondary | ICD-10-CM | POA: Diagnosis present

## 2019-01-11 DIAGNOSIS — D649 Anemia, unspecified: Secondary | ICD-10-CM

## 2019-01-11 DIAGNOSIS — E43 Unspecified severe protein-calorie malnutrition: Secondary | ICD-10-CM | POA: Diagnosis present

## 2019-01-11 DIAGNOSIS — Z79891 Long term (current) use of opiate analgesic: Secondary | ICD-10-CM

## 2019-01-11 DIAGNOSIS — T426X5A Adverse effect of other antiepileptic and sedative-hypnotic drugs, initial encounter: Secondary | ICD-10-CM | POA: Diagnosis not present

## 2019-01-11 DIAGNOSIS — E860 Dehydration: Secondary | ICD-10-CM

## 2019-01-11 DIAGNOSIS — T451X5A Adverse effect of antineoplastic and immunosuppressive drugs, initial encounter: Secondary | ICD-10-CM

## 2019-01-11 DIAGNOSIS — C9000 Multiple myeloma not having achieved remission: Secondary | ICD-10-CM | POA: Diagnosis not present

## 2019-01-11 DIAGNOSIS — Z20822 Contact with and (suspected) exposure to covid-19: Secondary | ICD-10-CM | POA: Diagnosis present

## 2019-01-11 DIAGNOSIS — R64 Cachexia: Secondary | ICD-10-CM | POA: Diagnosis present

## 2019-01-11 DIAGNOSIS — Z9484 Stem cells transplant status: Secondary | ICD-10-CM

## 2019-01-11 DIAGNOSIS — Z79899 Other long term (current) drug therapy: Secondary | ICD-10-CM

## 2019-01-11 DIAGNOSIS — Z961 Presence of intraocular lens: Secondary | ICD-10-CM | POA: Diagnosis present

## 2019-01-11 DIAGNOSIS — Z981 Arthrodesis status: Secondary | ICD-10-CM

## 2019-01-11 DIAGNOSIS — Z7952 Long term (current) use of systemic steroids: Secondary | ICD-10-CM

## 2019-01-11 DIAGNOSIS — D500 Iron deficiency anemia secondary to blood loss (chronic): Secondary | ICD-10-CM | POA: Diagnosis present

## 2019-01-11 DIAGNOSIS — N179 Acute kidney failure, unspecified: Secondary | ICD-10-CM | POA: Diagnosis present

## 2019-01-11 DIAGNOSIS — Z7982 Long term (current) use of aspirin: Secondary | ICD-10-CM

## 2019-01-11 DIAGNOSIS — D72819 Decreased white blood cell count, unspecified: Secondary | ICD-10-CM

## 2019-01-11 DIAGNOSIS — Z66 Do not resuscitate: Secondary | ICD-10-CM | POA: Diagnosis not present

## 2019-01-11 DIAGNOSIS — G9341 Metabolic encephalopathy: Secondary | ICD-10-CM | POA: Diagnosis present

## 2019-01-11 LAB — CBC WITH DIFFERENTIAL/PLATELET
Abs Immature Granulocytes: 0.03 10*3/uL (ref 0.00–0.07)
Basophils Absolute: 0 10*3/uL (ref 0.0–0.1)
Basophils Relative: 0 %
Eosinophils Absolute: 0 10*3/uL (ref 0.0–0.5)
Eosinophils Relative: 0 %
HCT: 26.6 % — ABNORMAL LOW (ref 39.0–52.0)
Hemoglobin: 8.6 g/dL — ABNORMAL LOW (ref 13.0–17.0)
Immature Granulocytes: 2 %
Lymphocytes Relative: 37 %
Lymphs Abs: 0.7 10*3/uL (ref 0.7–4.0)
MCH: 30.6 pg (ref 26.0–34.0)
MCHC: 32.3 g/dL (ref 30.0–36.0)
MCV: 94.7 fL (ref 80.0–100.0)
Monocytes Absolute: 0.4 10*3/uL (ref 0.1–1.0)
Monocytes Relative: 18 %
Neutro Abs: 0.8 10*3/uL — ABNORMAL LOW (ref 1.7–7.7)
Neutrophils Relative %: 43 %
Platelets: 9 10*3/uL — CL (ref 150–400)
RBC: 2.81 MIL/uL — ABNORMAL LOW (ref 4.22–5.81)
RDW: 15.3 % (ref 11.5–15.5)
WBC: 1.9 10*3/uL — ABNORMAL LOW (ref 4.0–10.5)
nRBC: 0 % (ref 0.0–0.2)

## 2019-01-11 LAB — COMPREHENSIVE METABOLIC PANEL
ALT: 20 U/L (ref 0–44)
AST: 33 U/L (ref 15–41)
Albumin: 3.4 g/dL — ABNORMAL LOW (ref 3.5–5.0)
Alkaline Phosphatase: 74 U/L (ref 38–126)
Anion gap: 8 (ref 5–15)
BUN: 26 mg/dL — ABNORMAL HIGH (ref 8–23)
CO2: 32 mmol/L (ref 22–32)
Calcium: >15 mg/dL (ref 8.9–10.3)
Chloride: 97 mmol/L — ABNORMAL LOW (ref 98–111)
Creatinine, Ser: 1.14 mg/dL (ref 0.61–1.24)
GFR calc Af Amer: >60 mL/min (ref >60)
GFR calc non Af Amer: >60 mL/min (ref >60)
Glucose, Bld: 117 mg/dL — ABNORMAL HIGH (ref 70–99)
Potassium: 3.4 mmol/L — ABNORMAL LOW (ref 3.5–5.1)
Sodium: 137 mmol/L (ref 135–145)
Total Bilirubin: 0.5 mg/dL (ref 0.3–1.2)
Total Protein: 7.7 g/dL (ref 6.5–8.1)

## 2019-01-11 LAB — SARS CORONAVIRUS 2 (TAT 6-24 HRS): SARS Coronavirus 2: NEGATIVE

## 2019-01-11 LAB — APTT: aPTT: 26 seconds (ref 24–36)

## 2019-01-11 LAB — SAMPLE TO BLOOD BANK

## 2019-01-11 LAB — PROTIME-INR
INR: 1 (ref 0.8–1.2)
Prothrombin Time: 13 seconds (ref 11.4–15.2)

## 2019-01-11 LAB — LACTATE DEHYDROGENASE: LDH: 179 U/L (ref 98–192)

## 2019-01-11 LAB — SEDIMENTATION RATE: Sed Rate: 98 mm/hr — ABNORMAL HIGH (ref 0–16)

## 2019-01-11 MED ORDER — ONDANSETRON HCL 4 MG PO TABS: 4.0000 mg | ORAL_TABLET | Freq: Three times a day (TID) | ORAL | Status: DC | PRN

## 2019-01-11 MED ORDER — SENNOSIDES-DOCUSATE SODIUM 8.6-50 MG PO TABS
1.0000 | ORAL_TABLET | Freq: Every day | ORAL | Status: DC
  Administered 2019-01-12 – 2019-01-26 (×11): 1 via ORAL
  Filled 2019-01-11 (×11): qty 1

## 2019-01-11 MED ORDER — DEXAMETHASONE SODIUM PHOSPHATE 4 MG/ML IJ SOLN
12.0000 mg | Freq: Once | INTRAMUSCULAR | Status: AC
  Administered 2019-01-11: 12 mg via INTRAVENOUS
  Filled 2019-01-11: qty 3

## 2019-01-11 MED ORDER — OXYCODONE HCL 5 MG PO TABS
15.0000 mg | ORAL_TABLET | ORAL | Status: DC | PRN
  Administered 2019-01-13 – 2019-02-09 (×12): 15 mg via ORAL
  Filled 2019-01-11 (×12): qty 3

## 2019-01-11 MED ORDER — SODIUM CHLORIDE 0.9 % IV BOLUS
1000.0000 mL | Freq: Once | INTRAVENOUS | Status: AC
  Administered 2019-01-11: 16:00:00 1000 mL via INTRAVENOUS

## 2019-01-11 MED ORDER — ZOLEDRONIC ACID 4 MG/5ML IV CONC: 4.0000 mg | Freq: Once | INTRAVENOUS | Status: DC

## 2019-01-11 MED ORDER — SODIUM CHLORIDE 0.9 % IV SOLN
10.0000 mL/h | Freq: Once | INTRAVENOUS | Status: AC
  Administered 2019-01-11: 10 mL/h via INTRAVENOUS

## 2019-01-11 MED ORDER — METHOCARBAMOL 500 MG PO TABS
500.0000 mg | ORAL_TABLET | Freq: Four times a day (QID) | ORAL | Status: DC | PRN
  Administered 2019-01-13 – 2019-01-29 (×5): 500 mg via ORAL
  Filled 2019-01-11 (×5): qty 1

## 2019-01-11 MED ORDER — SENNOSIDES-DOCUSATE SODIUM 8.6-50 MG PO TABS
1.0000 | ORAL_TABLET | Freq: Every evening | ORAL | Status: DC | PRN
  Administered 2019-01-21 – 2019-02-09 (×3): 1 via ORAL
  Filled 2019-01-11 (×3): qty 1

## 2019-01-11 MED ORDER — ZOLEDRONIC ACID 4 MG/5ML IV CONC
4.0000 mg | Freq: Once | INTRAVENOUS | Status: AC
  Administered 2019-01-11: 4 mg via INTRAVENOUS
  Filled 2019-01-11: qty 5

## 2019-01-11 MED ORDER — OXYMETAZOLINE HCL 0.05 % NA SOLN
1.0000 | Freq: Two times a day (BID) | NASAL | Status: DC
  Administered 2019-01-11 – 2019-02-10 (×57): 1 via NASAL
  Filled 2019-01-11: qty 15

## 2019-01-11 MED ORDER — SODIUM CHLORIDE 0.9 % IV SOLN
INTRAVENOUS | Status: DC
  Administered 2019-01-11 – 2019-01-15 (×9): via INTRAVENOUS

## 2019-01-11 MED ORDER — DRONABINOL 2.5 MG PO CAPS
10.0000 mg | ORAL_CAPSULE | Freq: Two times a day (BID) | ORAL | Status: DC
  Administered 2019-01-11 – 2019-02-10 (×53): 10 mg via ORAL
  Filled 2019-01-11 (×54): qty 4

## 2019-01-11 MED ORDER — SODIUM CHLORIDE 0.9% FLUSH
10.0000 mL | INTRAVENOUS | Status: DC | PRN
  Administered 2019-01-12 – 2019-02-08 (×5): 10 mL

## 2019-01-11 MED ORDER — MORPHINE SULFATE ER 30 MG PO TBCR
30.0000 mg | EXTENDED_RELEASE_TABLET | Freq: Two times a day (BID) | ORAL | Status: DC
  Administered 2019-01-11 – 2019-01-24 (×21): 30 mg via ORAL
  Filled 2019-01-11 (×21): qty 1

## 2019-01-11 MED ORDER — SUCRALFATE 1 G PO TABS
1.0000 g | ORAL_TABLET | Freq: Three times a day (TID) | ORAL | Status: DC
  Administered 2019-01-11 – 2019-02-10 (×100): 1 g via ORAL
  Filled 2019-01-11 (×96): qty 1

## 2019-01-11 MED ORDER — OXYMETAZOLINE HCL 0.05 % NA SOLN
1.0000 | Freq: Once | NASAL | Status: AC
  Administered 2019-01-11: 1 via NASAL
  Filled 2019-01-11: qty 30

## 2019-01-11 MED ORDER — GABAPENTIN 300 MG PO CAPS
300.0000 mg | ORAL_CAPSULE | Freq: Three times a day (TID) | ORAL | Status: DC
  Administered 2019-01-11 – 2019-01-21 (×23): 300 mg via ORAL
  Filled 2019-01-11 (×24): qty 1

## 2019-01-11 MED ORDER — DOCUSATE SODIUM 100 MG PO CAPS
100.0000 mg | ORAL_CAPSULE | Freq: Two times a day (BID) | ORAL | Status: DC
  Administered 2019-01-11 – 2019-01-26 (×22): 100 mg via ORAL
  Filled 2019-01-11 (×24): qty 1

## 2019-01-11 MED ORDER — ACETAMINOPHEN 325 MG PO TABS
325.0000 mg | ORAL_TABLET | Freq: Four times a day (QID) | ORAL | Status: DC | PRN
  Administered 2019-01-28 – 2019-01-29 (×3): 650 mg via ORAL
  Administered 2019-01-29: 325 mg via ORAL
  Administered 2019-01-30 – 2019-02-02 (×7): 650 mg via ORAL
  Administered 2019-02-03 – 2019-02-04 (×2): 325 mg via ORAL
  Administered 2019-02-05 – 2019-02-10 (×7): 650 mg via ORAL
  Filled 2019-01-11 (×2): qty 2
  Filled 2019-01-11: qty 1
  Filled 2019-01-11 (×8): qty 2
  Filled 2019-01-11: qty 1
  Filled 2019-01-11 (×8): qty 2

## 2019-01-11 MED ORDER — CHLORHEXIDINE GLUCONATE CLOTH 2 % EX PADS
6.0000 | MEDICATED_PAD | Freq: Every day | CUTANEOUS | Status: DC
  Administered 2019-01-11 – 2019-02-10 (×30): 6 via TOPICAL

## 2019-01-11 MED ORDER — TRANEXAMIC ACID 1000 MG/10ML IV SOLN
500.0000 mg | Freq: Once | INTRAVENOUS | Status: DC
  Filled 2019-01-11: qty 10

## 2019-01-11 NOTE — ED Notes (Signed)
CRITICAL VALUE STICKER  CRITICAL VALUE: [place] 9  RECEIVER (on-site recipient of call): Jada RN  [place] NOTIFIED: 01/11/2019  MESSENGER (representative from lab): Thyra Breed   MD NOTIFIED: Fritzi Mandes MD  TIME OF NOTIFICATION: [id number]  RESPONSE:

## 2019-01-11 NOTE — ED Provider Notes (Signed)
[facility] DEPT Provider Note   CSN: [id number] Arrival date & time: 01/11/19  1242     History   Chief Complaint Chief Complaint  Patient presents with  . Epistaxis    HPI Jesus [name] is a 63 y.o. male with a past medical history of multiple myeloma, status post stem cell transplant, hypercholesterol, who presents today for evaluation of epistaxis.  He reports that since last night he has had left-sided nasal bleeding.  He has been packing his nose with paper towels throughout the night without cessation of bleeding.  He was previously seen at [facility] on 12/26/2018 where he had left-sided epistaxis that required thrombin for control.  He has not tried any Afrin.  Review shows that over the past month his platelets have down trended from 105, this 63 and 12 days ago was 25.    He denies any trauma.  No fevers.  He reports that he otherwise feels generally well.      HPI  Past Medical History:  Diagnosis Date  . Arthritis   . Cancer ([place])    multiple myeloma  . Cervical stenosis of spinal canal   . Wears glasses     Patient Active Problem List   Diagnosis Date Noted  . Multiple myeloma ([place]) 11/04/2018  . Port-A-Cath in place 07/22/2018  . Mass of left thigh 06/23/2018  . Bone lesion 06/20/2018  . History of colon polyps 06/20/2018  . Nuclear cataract 06/20/2018  . Counseling regarding advance care planning and goals of care 04/20/2018  . Stem cells transplant status ([place]) 02/05/2017  . Myelopathy of thoracic region 09/01/2016  . S/P spinal fusion 08/26/2016  . 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 Multiple myeloma not having achieved remission ([place]) 08/17/2016  . Multiple myeloma in remission ([place]) 08/17/2016  . 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 Family history of prostate cancer 02/16/2012  . Hypercholesteremia 02/16/2012  . Benign prostatic hyperplasia with urinary obstruction 03/28/2009  . Encounter for  screening for cardiovascular disorders 06/10/2008  . Erectile dysfunction 02/10/2006    Past Surgical History:  Procedure Laterality Date  . ANTERIOR CERVICAL CORPECTOMY N/A 11/04/2018   Procedure: Cervical three Corpectomy with Cervical two to Cervical four Plating;  Surgeon: Erline Levine, MD;  Location: [facility];  Service: Neurosurgery;  Laterality: N/A;  . APPENDECTOMY    . BACK SURGERY    . EYE SURGERY Bilateral    cataract surgery with lens implants  . HERNIA REPAIR Left    inguinal   . IR IMAGING GUIDED PORT INSERTION  06/21/2018  . ROTATOR CUFF REPAIR Left         Home Medications    Prior to Admission medications   Medication Sig Start Date End Date Taking? Authorizing Provider  acetaminophen (TYLENOL) 325 MG tablet Take 325-650 mg by mouth every 6 (six) hours as needed (pain.).  09/07/16   [provider]  acyclovir (ZOVIRAX) 400 MG tablet Take 1 tablet (400 mg total) by mouth 2 (two) times daily. 10/21/18   Brunetta Genera, MD  aspirin 81 MG EC tablet Take 81 mg by mouth daily.     [provider]  Calcium Carb-Cholecalciferol (CALCIUM 600+D3) 600-800 MG-UNIT TABS Take 1 tablet by mouth daily.    [provider]  dexamethasone (DECADRON) 4 MG tablet Take 5 tablets (28m) with breakfast the day after each dose of Daratumumab Patient taking differently: Take 20 mg by mouth See admin instructions. Take 5 tablets (255m  with breakfast the day after each dose of Daratumumab 06/15/18   Brunetta Genera, MD  dronabinol (MARINOL) 10 MG capsule TAKE 1 CAPSULE BY MOUTH TWICE DAILY BEFORE MEAL(S) 11/28/18   Brunetta Genera, MD  ergocalciferol (VITAMIN D2) 1.25 MG (50000 UT) capsule Take 1 capsule (50,000 Units total) by mouth 2 (two) times a week. 09/29/18   Brunetta Genera, MD  gabapentin (NEURONTIN) 300 MG capsule Take 1 capsule (300 mg total) by mouth 3 (three) times daily. 11/07/18   Erline Levine, MD  methocarbamol (ROBAXIN) 500 MG tablet Take  1 tablet (500 mg total) by mouth every 6 (six) hours as needed for muscle spasms. 11/07/18   Erline Levine, MD  morphine (MS CONTIN) 30 MG 12 hr tablet Take 1 tablet (30 mg total) by mouth every 12 (twelve) hours. 11/29/18   Hayden Pedro, PA-C  ondansetron (ZOFRAN) 4 MG tablet Take 1 tablet (4 mg total) by mouth every 8 (eight) hours as needed for nausea or vomiting. 04/20/18   Brunetta Genera, MD  oxyCODONE (ROXICODONE) 15 MG immediate release tablet Take 1 tablet (15 mg total) by mouth every 4 (four) hours as needed for severe pain. 11/29/18   Hayden Pedro, PA-C  senna-docusate (SENOKOT-S) 8.6-50 MG tablet Take 1 tablet by mouth daily.  08/31/16   [provider]  sucralfate (CARAFATE) 1 g tablet Take 1 tablet (1 g total) by mouth 4 (four) times daily. 07/22/18   Kyung Rudd, MD    Family History Family History  Problem Relation Age of Onset  . Breast cancer Sister     Social History Social History   Tobacco Use  . Smoking status: Never Smoker  . Smokeless tobacco: Never Used  Substance Use Topics  . Alcohol use: Never    Frequency: Never  . Drug use: Never     Allergies   Penicillins   Review of Systems Review of Systems  Constitutional: Negative for chills and fever.  HENT: Positive for nosebleeds. Negative for facial swelling and trouble swallowing.   Respiratory: Negative for chest tightness and shortness of breath.   Cardiovascular: Negative for chest pain.  Gastrointestinal: Negative for abdominal pain.  Hematological: Bruises/bleeds easily.  All other systems reviewed and are negative.    Physical Exam Updated Vital Signs BP (!) 125/58   Pulse (!) 110   Temp 98.3 F (36.8 C) (Oral)   Resp (!) 24   SpO2 99%   Physical Exam Vitals signs and nursing note reviewed.  Constitutional:      Appearance: He is well-developed.  HENT:     Head: Normocephalic and atraumatic.     Nose:     Right Nostril: No epistaxis.     Left  Nostril: Epistaxis present.     Comments: There is brisk bleeding from the left nare.  There is bloody postnasal drainage visible in the oropharynx.  No source of bleeding visualized anteriorly.  Eyes:     Conjunctiva/sclera: Conjunctivae normal.  Neck:     Musculoskeletal: Neck supple.  Cardiovascular:     Rate and Rhythm: Normal rate and regular rhythm.     Heart sounds: No murmur.  Pulmonary:     Effort: Pulmonary effort is normal. No respiratory distress.     Breath sounds: Normal breath sounds.  Abdominal:     Palpations: Abdomen is soft.     Tenderness: There is no abdominal tenderness.  Musculoskeletal:     Right lower leg: No edema.  Left lower leg: No edema.  Skin:    General: Skin is warm and dry.  Neurological:     Mental Status: He is alert.     Comments: No facial droop.    Psychiatric:        Mood and Affect: Mood normal.        Behavior: Behavior normal.      ED Treatments / Results  Labs (all labs ordered are listed, but only abnormal results are displayed) Labs Reviewed  CBC WITH DIFFERENTIAL/PLATELET - Abnormal; Notable for the following components:      Result Value   WBC 1.9 (*)    RBC 2.81 (*)    Hemoglobin 8.6 (*)    HCT 26.6 (*)    Platelets 9 (*)    Neutro Abs 0.8 (*)    All other components within normal limits  COMPREHENSIVE METABOLIC PANEL - Abnormal; Notable for the following components:   Potassium 3.4 (*)    Chloride 97 (*)    Glucose, Bld 117 (*)    BUN 26 (*)    Calcium >15.0 (*)    Albumin 3.4 (*)    All other components within normal limits  SARS CORONAVIRUS 2 (TAT 6-24 HRS)  PROTIME-INR  APTT  SAMPLE TO BLOOD BANK  TYPE AND SCREEN  PREPARE PLATELET PHERESIS    EKG None  Radiology No results found.  Procedures .Critical Care Performed by: Lorin Glass, PA-C Authorized by: Lorin Glass, PA-C   Critical care provider statement:    Critical care time (minutes):  45   Critical care was time spent  personally by me on the following activities:  Discussions with consultants, evaluation of patient's response to treatment, examination of patient, ordering and performing treatments and interventions, ordering and review of laboratory studies, ordering and review of radiographic studies, pulse oximetry, re-evaluation of patient's condition, obtaining history from patient or surrogate and review of old charts Comments:     Platelet transfusion, admission  .Epistaxis Management  Date/Time: 01/11/2019 8:15 PM Performed by: Lorin Glass, PA-C Authorized by: Lorin Glass, PA-C   Consent:    Consent obtained:  Verbal   Consent given by:  Patient   Risks discussed:  Bleeding, infection, nasal injury and pain   Alternatives discussed:  No treatment, alternative treatment and referral Anesthesia (see MAR for exact dosages):    Anesthesia method:  None Procedure details:    Treatment site:  Unable to specify (Left side)   Repair method: Afrin.   Treatment complexity:  Limited   Treatment episode: initial   Post-procedure details:    Assessment:  Bleeding stopped   Patient tolerance of procedure:  Tolerated well, no immediate complications   (including critical care time)  Medications Ordered in ED Medications  tranexamic acid (CYKLOKAPRON) injection 500 mg (0 mg Topical Hold 01/11/19 1556)  0.9 %  sodium chloride infusion (has no administration in time range)  sodium chloride 0.9 % bolus 1,000 mL (has no administration in time range)  oxymetazoline (AFRIN) 0.05 % nasal spray 1 spray (1 spray Each Nare Given 01/11/19 1333)     Initial Impression / Assessment and Plan / ED Course  I have reviewed the triage vital signs and the nursing notes.  Pertinent labs & imaging results that were available during my care of the patient were reviewed by me and considered in my medical decision making (see chart for details).  Clinical Course as of Jan 11 1611  Wed Jan 11, 2019  1421  Patient re-evaluated, he feels he has stopped bleeding.  No active blood from the nares bilaterally.  Will monitor, await labs.    [EH]  1454 Platelets(!!): 9 [EH]  1514 WBC(!): 1.9 [EH]  1514 Calcium(!!): >15.0 [EH]  1531 Spoke with On call oncology who will get Dr. Irene Limbo to call me.     [EH]  [phone] Spoke with Dr. Irene Limbo who requested I transfuse a unit of platelets, 1 liter IV saline over 2-3 hours and admit.    [EH]  1609 Spoke with Hospitalist who will admit patient.    [EH]    Clinical Course User Index [EH] Lorin Glass, PA-C      Jesus [name] is a 63 year old gentleman with a past medical history of multiple myeloma who presents today for evaluation of epistaxis.  He had a previous left-sided epistaxis approximately 3 weeks ago which required treatment with thrombin.  Today his nose has been bleeding for 12 to 18 hours on arrival.  Bleeding appears to be coming from the left nare.  Unable to visualize source of bleeding.  He was given Afrin and after 50 minutes of holding pressure the bleeding had resolved.  Given his history of multiple myeloma, chart review shows that his platelets have been downtrending over the past month.  Labs are obtained.  Today his platelets are 9.  In addition to that he is at his baseline anemia of 8.6.  White count is also decreasing at 1.9.  CMP is significant for calcium elevated above 15.  PTT and PT/INR are both normal.  I spoke with patient's primary oncologist Dr. Irene Limbo who recommended transfusing 1 unit of platelets given the degree of his thrombocytopenia, in addition to 1 L of IV saline over 2 to 3 hours given his significant hypercalcemia.  I spoke with hospitalist who agreed to admit patient.  This patient was seen as a shared visit with Dr. Zenia Resides.   Patient remained hemodynamically stable while in my care.    Final Clinical Impressions(s) / ED Diagnoses   Final diagnoses:  Epistaxis  Thrombocytopenia ([place])  Hypercalcemia   Multiple myeloma not having achieved remission (H[facility])  Leukopenia, unspecified type  Anemia, unspecified type    ED Discharge Orders    None       Ollen Gross 01/11/19 2019    Lacretia Leigh, MD 01/12/19 [phone]

## 2019-01-11 NOTE — Telephone Encounter (Signed)
Patient wife called.Patient has had nosebleed since 10pm last night. They have tried nasal spray (given in ED last time for nosebleed), ice and pinching nostril. Bleeding continues - per wife it is 'pouring'. Advised patient to go to ED asap. Dr. Irene Limbo informed and in agreement with recommendation. Wife verbalized understanding.

## 2019-01-11 NOTE — ED Notes (Signed)
Date and time results received: 01/11/19 2:23 PM  (use smartphrase ".now" to insert current time)  Test: Ca++ Critical Value: >15  Name of Provider Notified: P.A. Elizabeth  Orders Received? Or Actions Taken?:

## 2019-01-11 NOTE — ED Provider Notes (Signed)
Medical screening examination/treatment/procedure(s) were conducted as a shared visit with non-physician practitioner(s) and myself.  I personally evaluated the patient during the encounter.    63 year old male with history of multiple myeloma presents with nosebleed.  Nosebleed was controlled here.  Has evidence of thrombocytopenia as well as hypocalcemia.  Discussed with oncology and patient will receive saline as well as place and will be admitted to the medicine service   Lacretia Leigh, MD 01/11/19 [phone]

## 2019-01-11 NOTE — ED Triage Notes (Signed)
Pt BIB POV c/o epistaxis that started last night at 2200. Pt currently has a paper towel rolled into the nose to stop the bleeding. He states he needs to change this every 15 min. No hx HTN. Normotensive in triage.

## 2019-01-11 NOTE — ED Notes (Signed)
Attempted to call report x 1. Receiving RN is giving patient care. Will call back.

## 2019-01-11 NOTE — Progress Notes (Signed)
[name]   HEMATOLOGY/ONCOLOGY INPATIENT PROGRESS NOTE  Date of Service: 01/11/2019  Inpatient Attending: .Lacretia Leigh, MD   SUBJECTIVE  Patient and his wife had called the oncology clinic this morning with severe epistaxis persistent for the better part of 24 hours.  They recommended to go to the emergency room. 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  He presented with worsening pancytopenia with severe thrombocytopenia with platelets of 9k.  Also noted to have severe hypercalcemia of more than 15. He notes some right-sided chest wall pain but no other pain.  Appears to be somewhat confused and dehydrated. He has received nasal sprays and 1 unit of platelets and epistaxis has currently resolved.  Patient is resting in the emergency room and she will be admitted.   OBJECTIVE:  PHYSICAL EXAMINATION: . Vitals:   01/11/19 1530 01/11/19 1545 01/11/19 1649 01/11/19 1653  BP: 120/70 (!) 125/58 119/73   Pulse: 100 (!) 110 100   Resp: 18 (!) 24 20   Temp:    98.2 F (36.8 C)  TempSrc:    Oral  SpO2: 100% 99% 100%    Filed Weights   01/12/19 0500  Weight: 108 lb 3.9 oz (49.1 kg)   .Body mass index is 16.95 kg/m.  GENERAL:alert, resolved epistaxis.  Mild discomfort from right chest wall pain. SKIN: Decreased skin turgor suggesting dehydration EYES: Sunken eyes OROPHARYNX: Dry mucous membranes  NECK: supple, LYMPH:  no palpable lymphadenopathy in the cervical, axillary or inguinal LUNGS: clear to auscultation with normal respiratory effort HEART: regular rate & rhythm,  no murmurs and no lower extremity edema ABDOMEN: abdomen soft, non-tender, normoactive bowel sounds  Musculoskeletal: no cyanosis of digits and no clubbing  PSYCH: alert & somewhat confused and a little sleepy. NEURO: no focal motor/sensory deficits   MEDICAL HISTORY:  Past Medical History:  Diagnosis Date   Arthritis    Cancer ([place])    multiple  myeloma   Cervical stenosis of spinal canal    Wears glasses     SURGICAL HISTORY: Past Surgical History:  Procedure Laterality Date   ANTERIOR CERVICAL CORPECTOMY N/A 11/04/2018   Procedure: Cervical three Corpectomy with Cervical two to Cervical four Plating;  Surgeon: Erline Levine, MD;  Location: [facility];  Service: Neurosurgery;  Laterality: N/A;   APPENDECTOMY     BACK SURGERY     EYE SURGERY Bilateral    cataract surgery with lens implants   HERNIA REPAIR Left    inguinal    IR IMAGING GUIDED PORT INSERTION  06/21/2018   ROTATOR CUFF REPAIR Left     SOCIAL HISTORY: Social History   Socioeconomic History   Marital status: Married    Spouse name: Not on file   Number of children: Not on file   Years of education: Not on file   Highest education level: Not on file  Occupational History   Not on file  Social Needs   Financial resource strain: Not on file   Food insecurity    Worry: Not on file    Inability: Not on file   Transportation needs    Medical: No    Non-medical: No  Tobacco Use   Smoking status: Never Smoker   Smokeless tobacco: Never Used  Substance and Sexual Activity   Alcohol use: Never    Frequency: Never   Drug use: Never   Sexual activity: Not on file  Lifestyle   Physical activity    Days  per week: Not on file    Minutes per session: Not on file   Stress: Not on file  Relationships   Social connections    Talks on phone: Not on file    Gets together: Not on file    Attends religious service: Not on file    Active member of club or organization: Not on file    Attends meetings of clubs or organizations: Not on file    Relationship status: Not on file   Intimate partner violence    Fear of current or ex partner: Not on file    Emotionally abused: Not on file    Physically abused: Not on file    Forced sexual activity: Not on file  Other Topics Concern   Not on file  Social History Narrative   Not on file     FAMILY HISTORY: Family History  Problem Relation Age of Onset   Breast cancer Sister     ALLERGIES:  is allergic to penicillins.  MEDICATIONS:  Scheduled Meds:  tranexamic acid  500 mg Topical Once   Continuous Infusions:  sodium chloride     sodium chloride     zoledronic acid (ZOMETA) IV     PRN Meds:.  REVIEW OF SYSTEMS:    10 Point review of Systems was done is negative except as noted above.   LABORATORY DATA:  I have reviewed the data as listed  . CBC Latest Ref Rng & Units 01/11/2019 12/30/2018 12/15/2018  WBC 4.0 - 10.5 K/uL 1.9(L) 2.0(L) 3.4(L)  Hemoglobin 13.0 - 17.0 g/dL 8.6(L) 8.5(L) 9.2(L)  Hematocrit 39.0 - 52.0 % 26.6(L) 25.6(L) 28.7(L)  Platelets 150 - 400 K/uL 9(LL) 25(L) 63(L)    . CMP Latest Ref Rng & Units 01/11/2019 12/30/2018 12/15/2018  Glucose 70 - 99 mg/dL 117(H) 134(H) 108(H)  BUN 8 - 23 mg/dL 26(H) 6(L) 6(L)  Creatinine 0.61 - 1.24 mg/dL 1.14 0.76 0.70  Sodium 135 - 145 mmol/L 137 137 139  Potassium 3.5 - 5.1 mmol/L 3.4(L) 3.5 3.7  Chloride 98 - 111 mmol/L 97(L) 101 101  CO2 22 - 32 mmol/L 32 27 30  Calcium 8.9 - 10.3 mg/dL >15.0(HH) 11.3(H) 10.7(H)  Total Protein 6.5 - 8.1 g/dL 7.7 7.5 7.4  Total Bilirubin 0.3 - 1.2 mg/dL 0.5 0.4 0.5  Alkaline Phos 38 - 126 U/L 74 74 77  AST 15 - 41 U/L 33 23 15  ALT 0 - 44 U/L _0 RADIOGRAPHIC STUDIES: I have personally reviewed the radiological images as listed and agreed with the findings in the report. No results found.  ASSESSMENT & PLAN:   63 y.o. male with  1. 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 IgG at 2220. 08/11/16 PET/CT revealed Hypermetabolic large soft tissue mass in the lower thoracic paraspinal region associated with lytic destruction of T10 vertebral body, concerning  for multiple myeloma. Additional smaller lytic lesions involving the skeleton. S/p RT x 4 fractions, discontinued due to T10 pathologic fracture with severe cord compression S/p 08/26/16 T10 corpectomy and T8-L1 posterior spinal fusion   Began 7 cycles of VRd on 09/24/16 S/p autologous stem cell transplant, Day 0 on 02/23/17 04/21/17 BM Bx with residual less than 5% lambda light chain restricted plasma cells in the bone marrow. M  Spike at 0.4g Began maintenance 97m/m2 Carfilzomib every 2 weeks on 06/16/17  06/09/18 PET/CT revealed "Multifocal hypermetabolic lytic lesions in the skeleton as noted above, compatible with active myeloma. In the spine, the most notable active lesion of concern is at the T12 level with there is a large right eccentric vertebral body lesion with demineralization of the cortex and likely some paravertebral extension of tumor. This could cause loosening of the right pedicle screw. Intraspinal extension of tumor is difficult to exclude. MRI might be considered although the posterolateral rod and pedicle screws may cause artifact. 2. There is likewise cortical breakthrough associated with the lytic lesion along the left upper acetabulum. 3. A lesion in the left intertrochanteric area of the femur is large enough to potentially cause biomechanical weakening which increases risk of fracture. 4. Other skeletal sites of active involvement are detailed above in the skeleton section. 5. Several lucent lesions are observed including the calvarium, T1 vertebral body, and left L4 pedicle which are not hypermetabolic and may represent effectively previously treated lesions. 6. 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 There is also a lipoma along the right brachialis muscle. 7. Nonspecific subcutaneous edema especially in the right forearm but also extending up into the right upper arm. Cause is uncertain. There is some low-grade  nonfocal metabolic activity along this area. Strictly speaking, cellulitis is not excluded, correlate with clinical history and visual inspection. 8. Other imaging findings of potential clinical significance: Aortic Atherosclerosis. Mild cardiomegaly. Cholelithiasis. Prostatomegaly."  06/17/18 MMP revealed M Protein at 0.5g, just before beginning C1 Daratumumab  06/30/18 BM Biopsy revealed normocellular marrow with minimal involvement by plasma cell neoplasm (5% plasma cells), normal cytogenetics.  06/30/18 Molecular Cytogenetics did not have enough material for testing.  10/13/2018 PET whole body revealed "1. Mixed response to therapy with some lesions decreased significantly in metabolic activity and others increased in metabolic activity as well size on the CT portion exam. 2. Expansion of lytic lesion at C3 with loss of a large portion of the vertebral body structure and intense metabolic activity. Recommend non emergent neuro surgical consultation for evaluation of this destructive C3 lesion. Dedicated CT or MRI of the neck may also provide more information. 3. Increase in size and metabolic activity of RIGHT iliac lesion and T5 vertebral body lesion. 4. Marked improvement with reduction in metabolic activity of RIGHT humerus and scapular metastatic lesions as well as marked reduction in metabolic activity large LEFT iliac bone lesion. 5. No evidence of soft tissue plasmacytoma or metastasis."  #2 severe hypercalcemia likely due to progression of his multiple myeloma.  Mild confusion and significant dehydration.  #3 severe uncontrolled epistaxis likely from dry mucous membranes plus severe thrombocytopenia PLAN: -Discussed pt labwork today with the patient. -Transfuse platelets as needed to keep platelets more than 20k in the setting of severe epistaxis. 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 -Aggressive IV  hydration for his hypercalcemia. -Ordered 1 dose of dexamethasone 12 mg and Zometa to try to help control his hypercalcemia. -Bone scan to evaluate for significance of bone lesions. -Patient did not want to discuss much in the emergency room.  Will need likely bone marrow biopsy repeated. -[facility] options. -We will discuss with him other salvage treatment options based on goals of care tomorrow.  I spent 30 minutes counseling the patient face to face. The total time spent in the appointment was 40 minutes and more than 50% was on counseling and direct patient cares.    Sullivan Lone MD [place] AAHIVMS [facility] Hematology/Oncology Physician [facility]  (Office):       [phone] (Work cell):  [phone] (Fax):           [phone]  01/11/2019 4:55 PM

## 2019-01-11 NOTE — ED Notes (Signed)
Dr. Zenia Resides aware patients platelet count is 9.

## 2019-01-11 NOTE — H&P (Addendum)
History and Physical    Jesus [name] FVC:[id number] DOB: [birth year] DOA: 01/11/2019  PCP: Patient, No Pcp Per  Patient coming from: Home  I have personally briefly reviewed patient's old medical records in [place]  Chief Complaint: Nosebleed  HPI: Jesus [name] is a 63 y.o. male with medical history significant of Multiple Myeloma, Arthritis, cervical stenosis who presents with nosebleed.  Patient reports hie nosebleed began last night around 10 PM.  He could not sleep throughout the night as he had profuse bleeding.  He and his wife attempted to stop the bleeding with pressure and paper towels.  They reported trying a spray and ice.  They came in this morning due to failure of their times.  Patient had a similar episode 2 weeks prior where bleeding lasted 8 hours before it finally stopped.  Apart from that it has been a while since he has had an episode this bad.  He has had poor appetite for the past couple of weeks.  He has been trying to supplement with Ensure.  His wife reports that he was a little more confused this morning, not knowing his hematologist name.  He denies any fevers, but reports being chronically cold.  He denies any chest pain, shortness of breath, cough.  No nausea, vomiting, diarrhea.  He denies any tobacco, alcohol, drug use  He lives at home and is independent in his ADLs  Review of Systems: As per HPI otherwise 10 point review of systems negative.   Past Medical History:  Diagnosis Date  . Arthritis   . Cancer ([place])    multiple myeloma  . Cervical stenosis of spinal canal   . Wears glasses     Past Surgical History:  Procedure Laterality Date  . ANTERIOR CERVICAL CORPECTOMY N/A 11/04/2018   Procedure: Cervical three Corpectomy with Cervical two to Cervical four Plating;  Surgeon: Erline Levine, MD;  Location: [facility];  Service: Neurosurgery;  Laterality: N/A;  . APPENDECTOMY    . BACK SURGERY    . EYE SURGERY Bilateral    cataract surgery  with lens implants  . HERNIA REPAIR Left    inguinal   . IR IMAGING GUIDED PORT INSERTION  06/21/2018  . ROTATOR CUFF REPAIR Left      reports that he has never smoked. He has never used smokeless tobacco. He reports that he does not drink alcohol or use drugs.  Allergies  Allergen Reactions  . Penicillins Tinitus    Did it involve swelling of the face/tongue/throat, SOB, or low BP? No Did it involve sudden or severe rash/hives, skin peeling, or any reaction on the inside of your mouth or nose? No Did you need to seek medical attention at a hospital or doctor's office? Yes When did it last happen? More than 30 years ago If all above answers are "NO", may proceed with cephalosporin use.     Family History  Problem Relation Age of Onset  . Breast cancer Sister      Prior to Admission medications   Medication Sig Start Date End Date Taking? Authorizing Provider  acetaminophen (TYLENOL) 325 MG tablet Take 325-650 mg by mouth every 6 (six) hours as needed (pain.).  09/07/16   [provider]  acyclovir (ZOVIRAX) 400 MG tablet Take 1 tablet (400 mg total) by mouth 2 (two) times daily. 10/21/18   Brunetta Genera, MD  aspirin 81 MG EC tablet Take 81 mg by mouth daily.     [provider]  Calcium Carb-Cholecalciferol (CALCIUM 600+D3) 600-800 MG-UNIT TABS Take 1 tablet by mouth daily.    [provider]  dexamethasone (DECADRON) 4 MG tablet Take 5 tablets (27m) with breakfast the day after each dose of Daratumumab Patient taking differently: Take 20 mg by mouth See admin instructions. Take 5 tablets (227m with breakfast the day after each dose of Daratumumab 06/15/18   KaBrunetta GeneraMD  dronabinol (MARINOL) 10 MG capsule TAKE 1 CAPSULE BY MOUTH TWICE DAILY BEFORE MEAL(S) 11/28/18   KaBrunetta GeneraMD  ergocalciferol (VITAMIN D2) 1.25 MG (50000 UT) capsule Take 1 capsule (50,000 Units total) by mouth 2 (two) times a week. 09/29/18   KaBrunetta GeneraMD  gabapentin (NEURONTIN) 300 MG capsule Take 1 capsule (300 mg total) by mouth 3 (three) times daily. 11/07/18   StErline LevineMD  methocarbamol (ROBAXIN) 500 MG tablet Take 1 tablet (500 mg total) by mouth every 6 (six) hours as needed for muscle spasms. 11/07/18   StErline LevineMD  morphine (MS CONTIN) 30 MG 12 hr tablet Take 1 tablet (30 mg total) by mouth every 12 (twelve) hours. 11/29/18   PeHayden PedroPA-C  ondansetron (ZOFRAN) 4 MG tablet Take 1 tablet (4 mg total) by mouth every 8 (eight) hours as needed for nausea or vomiting. 04/20/18   KaBrunetta GeneraMD  oxyCODONE (ROXICODONE) 15 MG immediate release tablet Take 1 tablet (15 mg total) by mouth every 4 (four) hours as needed for severe pain. 11/29/18   PeHayden PedroPA-C  senna-docusate (SENOKOT-S) 8.6-50 MG tablet Take 1 tablet by mouth daily.  08/31/16   [provider]  sucralfate (CARAFATE) 1 g tablet Take 1 tablet (1 g total) by mouth 4 (four) times daily. 07/22/18   MoKyung RuddMD    Physical Exam: Vitals:   01/11/19 1400 01/11/19 1445 01/11/19 1530 01/11/19 1545  BP: (!) 120/59 (!) 142/78 120/70 (!) 125/58  Pulse: 99 (!) 103 100 (!) 110  Resp: _0 (!) 24  Temp:      TempSrc:      SpO2: 99% 100% 100% 99%     Vitals:   01/11/19 1400 01/11/19 1445 01/11/19 1530 01/11/19 1545  BP: (!) 120/59 (!) 142/78 120/70 (!) 125/58  Pulse: 99 (!) 103 100 (!) 110  Resp: _1 (!) 24  Temp:      TempSrc:      SpO2: 99% 100% 100% 99%   Constitutional: NAD, calm, comfortable, soft spoken Eyes: PERRL, lids and conjunctivae normal ENMT: Mucous membranes are moist. Posterior pharynx clear of any exudate or lesions.Normal dentition.  Neck: normal, supple, no masses, no thyromegaly Respiratory: clear to auscultation bilaterally, no wheezing, no crackles. Normal respiratory effort. No accessory muscle use.  Cardiovascular: Regular rate and rhythm, no murmurs / rubs / gallops. No  extremity edema. 2+ pedal pulses. No carotid bruits.  Abdomen: no tenderness, no masses palpated. No hepatosplenomegaly. Bowel sounds positive.  Musculoskeletal: no clubbing / cyanosis. No joint deformity upper and lower extremities. Good ROM, no contractures. Normal muscle tone.  Skin: no rashes, lesions, ulcers. No induration Neurologic: CN 2-12 grossly intact. Sensation and strength grossly intact. Psychiatric: Normal judgment and insight. Alert and oriented x 3. Normal mood.    Labs on Admission: I have personally reviewed following labs and imaging studies  CBC: Recent Labs  Lab 01/11/19 1349  WBC 1.9*  NEUTROABS 0.8*  HGB 8.6*  HCT 26.6*  MCV 94.7  PLT 9*   Basic Metabolic Panel: Recent Labs  Lab 01/11/19 1349  NA 137  K 3.4*  CL 97*  CO2 32  GLUCOSE 117*  BUN 26*  CREATININE 1.14  CALCIUM >15.0*   GFR: Estimated Creatinine Clearance: 47.2 mL/min (by C-G formula based on SCr of 1.14 mg/dL). Liver Function Tests: Recent Labs  Lab 01/11/19 1349  AST 33  ALT 20  ALKPHOS 74  BILITOT 0.5  PROT 7.7  ALBUMIN 3.4*   No results for input(s): LIPASE, AMYLASE in the last 168 hours. No results for input(s): AMMONIA in the last 168 hours. Coagulation Profile: Recent Labs  Lab 01/11/19 1349  INR 1.0   Cardiac Enzymes: No results for input(s): CKTOTAL, CKMB, CKMBINDEX, TROPONINI in the last 168 hours. BNP (last 3 results) No results for input(s): PROBNP in the last 8760 hours. HbA1C: No results for input(s): HGBA1C in the last 72 hours. CBG: No results for input(s): GLUCAP in the last 168 hours. Lipid Profile: No results for input(s): CHOL, HDL, LDLCALC, TRIG, CHOLHDL, LDLDIRECT in the last 72 hours. Thyroid Function Tests: No results for input(s): TSH, T4TOTAL, FREET4, T3FREE, THYROIDAB in the last 72 hours. Anemia Panel: No results for input(s): VITAMINB12, FOLATE, FERRITIN, TIBC, IRON, RETICCTPCT in the last 72 hours. Urine analysis:    Component Value  Date/Time   COLORURINE YELLOW 05/08/2017 1905   APPEARANCEUR CLEAR 05/08/2017 1905   LABSPEC 1.010 05/08/2017 1905   PHURINE 7.0 05/08/2017 1905   GLUCOSEU NEGATIVE 05/08/2017 [place] NEGATIVE 05/08/2017 [place] NEGATIVE 05/08/2017 [place] NEGATIVE 05/08/2017 1905   PROTEINUR NEGATIVE 05/08/2017 1905   NITRITE NEGATIVE 05/08/2017 1905   LEUKOCYTESUR NEGATIVE 05/08/2017 1905    Radiological Exams on Admission: No results found.  EKG: Independently reviewed.   Assessment/Plan Jesus [name] is a 63 y.o. male with medical history significant of Multiple Myeloma (recent C3 compression fracture s/p corpectomy, C2-C4 plating) cervical stenosis, Arthritis, who presents with nosebleed in setting of severe thrombocytopenia.  # Epistaxis  # Pancytopenia, multiple myeloma - Platelet count now at a nadir of 9K, suspect pancytopenia in setting of bone marrow infiltration in myeloma.  Dr. Irene Limbo consulted by ER and will follow patient.  Recommend 1 unit of platelets which the patient is now receiving. - epistaxis achieved stasis by ER team with Afrin and pressure - will follow Dr. Grier Mitts recommendations  # Hypercalcemia - > 15 - again 2/2 Multiple Myeloma, patient has poor appetite and constipation, possible confusion/forgetfulness - continue with IV fluids, considered dose of zoledronic acid, but dexamethasone 12 mg IV given by Dr. Irene Limbo (no jaw pain or dental pain, just pain in side and lower back) - stopped calcium supplement  # AKI - suspect acutely 2/2 dehydration, poor intake from hypercalcemia and also from underlying multiple myeloma - anticipate improvement with aggressive hydration - continue to monitor I/O  DVT prophylaxis: held Code Status: Full Family Communication: Wife at bedside Disposition Plan: pending Consults called: Dr. Irene Limbo, Hematology/Oncology Admission status: inpatient   Truddie Hidden MD [facility] Pager [phone]  If  7PM-7AM, please contact night-coverage [email or web address] Password [email or web address]  01/11/2019, 4:08 PM

## 2019-01-12 ENCOUNTER — Observation Stay (HOSPITAL_COMMUNITY): Payer: Medicare Other

## 2019-01-12 DIAGNOSIS — G9341 Metabolic encephalopathy: Secondary | ICD-10-CM | POA: Diagnosis present

## 2019-01-12 DIAGNOSIS — D649 Anemia, unspecified: Secondary | ICD-10-CM | POA: Diagnosis not present

## 2019-01-12 DIAGNOSIS — R64 Cachexia: Secondary | ICD-10-CM | POA: Diagnosis present

## 2019-01-12 DIAGNOSIS — T451X5A Adverse effect of antineoplastic and immunosuppressive drugs, initial encounter: Secondary | ICD-10-CM | POA: Diagnosis not present

## 2019-01-12 DIAGNOSIS — Z515 Encounter for palliative care: Secondary | ICD-10-CM | POA: Diagnosis present

## 2019-01-12 DIAGNOSIS — D500 Iron deficiency anemia secondary to blood loss (chronic): Secondary | ICD-10-CM | POA: Diagnosis present

## 2019-01-12 DIAGNOSIS — D72819 Decreased white blood cell count, unspecified: Secondary | ICD-10-CM | POA: Diagnosis not present

## 2019-01-12 DIAGNOSIS — E870 Hyperosmolality and hypernatremia: Secondary | ICD-10-CM | POA: Diagnosis not present

## 2019-01-12 DIAGNOSIS — R400 Somnolence: Secondary | ICD-10-CM

## 2019-01-12 DIAGNOSIS — D709 Neutropenia, unspecified: Secondary | ICD-10-CM | POA: Diagnosis not present

## 2019-01-12 DIAGNOSIS — C9002 Multiple myeloma in relapse: Secondary | ICD-10-CM | POA: Diagnosis not present

## 2019-01-12 DIAGNOSIS — E871 Hypo-osmolality and hyponatremia: Secondary | ICD-10-CM | POA: Diagnosis not present

## 2019-01-12 DIAGNOSIS — D708 Other neutropenia: Secondary | ICD-10-CM | POA: Diagnosis not present

## 2019-01-12 DIAGNOSIS — K5900 Constipation, unspecified: Secondary | ICD-10-CM | POA: Diagnosis not present

## 2019-01-12 DIAGNOSIS — C9000 Multiple myeloma not having achieved remission: Secondary | ICD-10-CM | POA: Diagnosis present

## 2019-01-12 DIAGNOSIS — D61818 Other pancytopenia: Secondary | ICD-10-CM | POA: Diagnosis present

## 2019-01-12 DIAGNOSIS — Z7189 Other specified counseling: Secondary | ICD-10-CM | POA: Diagnosis not present

## 2019-01-12 DIAGNOSIS — Z20822 Contact with and (suspected) exposure to covid-19: Secondary | ICD-10-CM | POA: Diagnosis present

## 2019-01-12 DIAGNOSIS — D599 Acquired hemolytic anemia, unspecified: Secondary | ICD-10-CM | POA: Diagnosis not present

## 2019-01-12 DIAGNOSIS — R040 Epistaxis: Secondary | ICD-10-CM

## 2019-01-12 DIAGNOSIS — E43 Unspecified severe protein-calorie malnutrition: Secondary | ICD-10-CM | POA: Diagnosis present

## 2019-01-12 DIAGNOSIS — D469 Myelodysplastic syndrome, unspecified: Secondary | ICD-10-CM | POA: Diagnosis present

## 2019-01-12 DIAGNOSIS — G253 Myoclonus: Secondary | ICD-10-CM | POA: Diagnosis not present

## 2019-01-12 DIAGNOSIS — R739 Hyperglycemia, unspecified: Secondary | ICD-10-CM | POA: Diagnosis present

## 2019-01-12 DIAGNOSIS — N179 Acute kidney failure, unspecified: Secondary | ICD-10-CM | POA: Diagnosis present

## 2019-01-12 DIAGNOSIS — Z9484 Stem cells transplant status: Secondary | ICD-10-CM | POA: Diagnosis not present

## 2019-01-12 DIAGNOSIS — Z66 Do not resuscitate: Secondary | ICD-10-CM | POA: Diagnosis not present

## 2019-01-12 DIAGNOSIS — T426X5A Adverse effect of other antiepileptic and sedative-hypnotic drugs, initial encounter: Secondary | ICD-10-CM | POA: Diagnosis not present

## 2019-01-12 DIAGNOSIS — Z681 Body mass index (BMI) 19 or less, adult: Secondary | ICD-10-CM | POA: Diagnosis not present

## 2019-01-12 DIAGNOSIS — D696 Thrombocytopenia, unspecified: Secondary | ICD-10-CM | POA: Diagnosis not present

## 2019-01-12 DIAGNOSIS — R4182 Altered mental status, unspecified: Secondary | ICD-10-CM | POA: Diagnosis not present

## 2019-01-12 DIAGNOSIS — D701 Agranulocytosis secondary to cancer chemotherapy: Secondary | ICD-10-CM | POA: Diagnosis not present

## 2019-01-12 DIAGNOSIS — T380X5A Adverse effect of glucocorticoids and synthetic analogues, initial encounter: Secondary | ICD-10-CM | POA: Diagnosis not present

## 2019-01-12 DIAGNOSIS — E876 Hypokalemia: Secondary | ICD-10-CM | POA: Diagnosis present

## 2019-01-12 DIAGNOSIS — F05 Delirium due to known physiological condition: Secondary | ICD-10-CM | POA: Diagnosis not present

## 2019-01-12 LAB — BASIC METABOLIC PANEL
Anion gap: 6 (ref 5–15)
BUN: 23 mg/dL (ref 8–23)
CO2: 30 mmol/L (ref 22–32)
Calcium: 13.7 mg/dL (ref 8.9–10.3)
Chloride: 103 mmol/L (ref 98–111)
Creatinine, Ser: 0.97 mg/dL (ref 0.61–1.24)
GFR calc Af Amer: >60 mL/min (ref >60)
GFR calc non Af Amer: >60 mL/min (ref >60)
Glucose, Bld: 154 mg/dL — ABNORMAL HIGH (ref 70–99)
Potassium: 3.7 mmol/L (ref 3.5–5.1)
Sodium: 139 mmol/L (ref 135–145)

## 2019-01-12 LAB — BPAM PLATELET PHERESIS
Blood Product Expiration Date: 202012102359
ISSUE DATE / TIME: 202012091643
Unit Type and Rh: 6200

## 2019-01-12 LAB — CBC
HCT: 18.9 % — ABNORMAL LOW (ref 39.0–52.0)
HCT: 17.6 % — ABNORMAL LOW (ref 39.0–52.0)
Hemoglobin: 6 g/dL — CL (ref 13.0–17.0)
Hemoglobin: 5.6 g/dL — CL (ref 13.0–17.0)
MCH: 30.9 pg (ref 26.0–34.0)
MCH: 31.1 pg (ref 26.0–34.0)
MCHC: 31.7 g/dL (ref 30.0–36.0)
MCHC: 31.8 g/dL (ref 30.0–36.0)
MCV: 97.4 fL (ref 80.0–100.0)
MCV: 97.8 fL (ref 80.0–100.0)
Platelets: 37 10*3/uL — ABNORMAL LOW (ref 150–400)
Platelets: 29 10*3/uL — CL (ref 150–400)
RBC: 1.94 MIL/uL — ABNORMAL LOW (ref 4.22–5.81)
RBC: 1.8 MIL/uL — ABNORMAL LOW (ref 4.22–5.81)
RDW: 15.2 % (ref 11.5–15.5)
RDW: 15.3 % (ref 11.5–15.5)
WBC: 1.5 10*3/uL — ABNORMAL LOW (ref 4.0–10.5)
WBC: 1.3 10*3/uL — CL (ref 4.0–10.5)
nRBC: 0 % (ref 0.0–0.2)
nRBC: 0 % (ref 0.0–0.2)

## 2019-01-12 LAB — PREPARE RBC (CROSSMATCH)

## 2019-01-12 LAB — PREPARE PLATELET PHERESIS: Unit division: 0

## 2019-01-12 LAB — HIV ANTIBODY (ROUTINE TESTING W REFLEX): HIV Screen 4th Generation wRfx: NONREACTIVE

## 2019-01-12 LAB — GLUCOSE, CAPILLARY: Glucose-Capillary: 124 mg/dL — ABNORMAL HIGH (ref 70–99)

## 2019-01-12 MED ORDER — SODIUM CHLORIDE 0.9% IV SOLUTION: Freq: Once | INTRAVENOUS | Status: DC

## 2019-01-12 MED ORDER — BOOST / RESOURCE BREEZE PO LIQD CUSTOM
1.0000 | Freq: Three times a day (TID) | ORAL | Status: DC
  Administered 2019-01-12 – 2019-02-10 (×66): 1 via ORAL

## 2019-01-12 MED ORDER — TECHNETIUM TC 99M MEDRONATE IV KIT
21.8000 | PACK | Freq: Once | INTRAVENOUS | Status: AC
  Administered 2019-01-12: 09:00:00 21.8 via INTRAVENOUS

## 2019-01-12 MED ORDER — PRO-STAT SUGAR FREE PO LIQD
30.0000 mL | Freq: Two times a day (BID) | ORAL | Status: DC
  Administered 2019-01-12 – 2019-02-10 (×44): 30 mL via ORAL
  Filled 2019-01-12 (×47): qty 30

## 2019-01-12 NOTE — Progress Notes (Addendum)
.   HEMATOLOGY/ONCOLOGY INPATIENT PROGRESS NOTE  Date of Service: 01/12/2019  Inpatient Attending: .Adhikari, Amrit, MD   SUBJECTIVE  Patient still having some periods of confusion and somnolence.  Somewhat improved.  No longer having epistaxis or any other bleeding.  He is not complaining of any pain at this time.  More anemic today and 1 unit PRBC already ordered. 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     OBJECTIVE:  PHYSICAL EXAMINATION: . Vitals:   01/11/19 2035 01/12/19 0500 01/12/19 0538 01/12/19 0948  BP: 127/71  117/73 (!) 107/53  Pulse: 99  84 85  Resp: 19  18 20  Temp: 98.5 F (36.9 C)  (!) 97.5 F (36.4 C)   TempSrc: Oral  Oral   SpO2: 100%  100% 100%  Weight:  108 lb 3.9 oz (49.1 kg)    Height:  5' 7" (1.702 m)     Filed Weights   01/12/19 0500  Weight: 108 lb 3.9 oz (49.1 kg)   .Body mass index is 16.95 kg/m.  GENERAL:alert, resolved epistaxis.  Looks comfortable SKIN: Decreased skin turgor suggesting dehydration EYES: Sunken eyes OROPHARYNX: Dry mucous membranes  NECK: supple, LYMPH:  no palpable lymphadenopathy in the cervical, axillary or inguinal LUNGS: clear to auscultation with normal respiratory effort HEART: regular rate & rhythm,  no murmurs and no lower extremity edema ABDOMEN: abdomen soft, non-tender, normoactive bowel sounds  Musculoskeletal: no cyanosis of digits and no clubbing  PSYCH: alert & somewhat confused and a little sleepy. NEURO: no focal motor/sensory deficits   MEDICAL HISTORY:  Past Medical History:  Diagnosis Date  . Arthritis   . Cancer ([facility])    multiple myeloma  . Cervical stenosis of spinal canal   . Wears glasses     SURGICAL HISTORY: Past Surgical History:  Procedure Laterality Date  . ANTERIOR CERVICAL CORPECTOMY N/A 11/04/2018   Procedure: Cervical three Corpectomy with Cervical two to Cervical four Plating;  Surgeon: Stern, Joseph, MD;   Location: [facility];  Service: Neurosurgery;  Laterality: N/A;  . APPENDECTOMY    . BACK SURGERY    . EYE SURGERY Bilateral    cataract surgery with lens implants  . HERNIA REPAIR Left    inguinal   . IR IMAGING GUIDED PORT INSERTION  06/21/2018  . ROTATOR CUFF REPAIR Left     SOCIAL HISTORY: Social History   Socioeconomic History  . Marital status: Married    Spouse name: Not on file  . Number of children: Not on file  . Years of education: Not on file  . Highest education level: Not on file  Occupational History  . Not on file  Tobacco Use  . Smoking status: Never Smoker  . Smokeless tobacco: Never Used  Substance and Sexual Activity  . Alcohol use: Never  . Drug use: Never  . Sexual activity: Not on file  Other Topics Concern  . Not on file  Social History Narrative  . Not on file   Social Determinants of Health   Financial Resource Strain:   . Difficulty of Paying Living Expenses: Not on file  Food Insecurity:   . Worried About Running Out of Food in the Last Year: Not on file  . Ran Out of Food in the Last Year: Not on file  Transportation Needs: No Transportation Needs  . Lack of Transportation (Medical): No  . Lack of Transportation (Non-Medical): No  Physical Activity:   . Days   of Exercise per Week: Not on file  . Minutes of Exercise per Session: Not on file  Stress:   . Feeling of Stress : Not on file  Social Connections:   . Frequency of Communication with Friends and Family: Not on file  . Frequency of Social Gatherings with Friends and Family: Not on file  . Attends Religious Services: Not on file  . Active Member of Clubs or Organizations: Not on file  . Attends Archivist Meetings: Not on file  . Marital Status: Not on file  Intimate Partner Violence:   . Fear of Current or Ex-Partner: Not on file  . Emotionally Abused: Not on file  . Physically Abused: Not on file  . Sexually Abused: Not on file    FAMILY HISTORY: Family History   Problem Relation Age of Onset  . Breast cancer Sister     ALLERGIES:  is allergic to penicillins.  MEDICATIONS:  Scheduled Meds: . sodium chloride   Intravenous Once  . Chlorhexidine Gluconate Cloth  6 each Topical Daily  . docusate sodium  100 mg Oral BID  . dronabinol  10 mg Oral BID  . gabapentin  300 mg Oral TID  . morphine  30 mg Oral Q12H  . oxymetazoline  1 spray Each Nare BID  . senna-docusate  1 tablet Oral Daily  . sucralfate  1 g Oral TID AC & HS  . tranexamic acid  500 mg Topical Once   Continuous Infusions: . sodium chloride 250 mL/hr at 01/12/19 0600   PRN Meds:.  REVIEW OF SYSTEMS:    10 Point review of Systems was done is negative except as noted above.   LABORATORY DATA:  I have reviewed the data as listed  . CBC Latest Ref Rng & Units 01/12/2019 01/12/2019 01/11/2019  WBC 4.0 - 10.5 K/uL 1.3(LL) 1.5(L) 1.9(L)  Hemoglobin 13.0 - 17.0 g/dL 5.6(LL) 6.0(LL) 8.6(L)  Hematocrit 39.0 - 52.0 % 17.6(L) 18.9(L) 26.6(L)  Platelets 150 - 400 K/uL 29(LL) 37(L) 9(LL)    . CMP Latest Ref Rng & Units 01/12/2019 01/11/2019 12/30/2018  Glucose 70 - 99 mg/dL 154(H) 117(H) 134(H)  BUN 8 - 23 mg/dL 23 26(H) 6(L)  Creatinine 0.61 - 1.24 mg/dL 0.97 1.14 0.76  Sodium 135 - 145 mmol/L 139 137 137  Potassium 3.5 - 5.1 mmol/L 3.7 3.4(L) 3.5  Chloride 98 - 111 mmol/L 103 97(L) 101  CO2 22 - 32 mmol/L 30 32 27  Calcium 8.9 - 10.3 mg/dL 13.7(HH) >15.0(HH) 11.3(H)  Total Protein 6.5 - 8.1 g/dL - 7.7 7.5  Total Bilirubin 0.3 - 1.2 mg/dL - 0.5 0.4  Alkaline Phos 38 - 126 U/L - 74 74  AST 15 - 41 U/L - 33 23  ALT 0 - 44 U/L - 20 13     RADIOGRAPHIC STUDIES: I have personally reviewed the radiological images as listed and agreed with the findings in the report. No results found.  ASSESSMENT & PLAN:   63 y.o. male with  1. Multiple Myeloma - high risk with 17p deletion July 2018 BM Bx revealed 20% monoclonal plasma cells July 2018 Cytogenetics revealed a 17p deletion  July 2018 Initial M spike at 1.5g with IgG Lambda specificity, K:L ratio of 0.27. IgG at 2220. 08/11/16 PET/CT revealed Hypermetabolic large soft tissue mass in the lower thoracic paraspinal region associated with lytic destruction of T10 vertebral body, concerning for multiple myeloma. Additional smaller lytic lesions involving the skeleton. S/p RT x 4 fractions, discontinued due  to T10 pathologic fracture with severe cord compression S/p 08/26/16 T10 corpectomy and T8-L1 posterior spinal fusion   Began 7 cycles of VRd on 09/24/16 S/p autologous stem cell transplant, Day 0 on 02/23/17 04/21/17 BM Bx with residual less than 5% lambda light chain restricted plasma cells in the bone marrow. M Spike at 0.4g Began maintenance 56mg/m2 Carfilzomib every 2 weeks on 06/16/17  06/09/18 PET/CT revealed "Multifocal hypermetabolic lytic lesions in the skeleton as noted above, compatible with active myeloma. In the spine, the most notable active lesion of concern is at the T12 level with there is a large right eccentric vertebral body lesion with demineralization of the cortex and likely some paravertebral extension of tumor. This could cause loosening of the right pedicle screw. Intraspinal extension of tumor is difficult to exclude. MRI might be considered although the posterolateral rod and pedicle screws may cause artifact. 2. There is likewise cortical breakthrough associated with the lytic lesion along the left upper acetabulum. 3. A lesion in the left intertrochanteric area of the femur is large enough to potentially cause biomechanical weakening which increases risk of fracture. 4. Other skeletal sites of active involvement are detailed above in the skeleton section. 5. Several lucent lesions are observed including the calvarium, T1 vertebral body, and left L4 pedicle which are not hypermetabolic and may represent effectively previously treated lesions. 6. The patient has a large lipoma anterior to the left hip in  between the iloipsoas in the rectus femoris muscles. There is also a lipoma along the right brachialis muscle. 7. Nonspecific subcutaneous edema especially in the right forearm but also extending up into the right upper arm. Cause is uncertain. There is some low-grade nonfocal metabolic activity along this area. Strictly speaking, cellulitis is not excluded, correlate with clinical history and visual inspection. 8. Other imaging findings of potential clinical significance: Aortic Atherosclerosis. Mild cardiomegaly. Cholelithiasis. Prostatomegaly."  06/17/18 MMP revealed M Protein at 0.5g, just before beginning C1 Daratumumab  06/30/18 BM Biopsy revealed normocellular marrow with minimal involvement by plasma cell neoplasm (5% plasma cells), normal cytogenetics.  06/30/18 Molecular Cytogenetics did not have enough material for testing.  10/13/2018 PET whole body revealed "1. Mixed response to therapy with some lesions decreased significantly in metabolic activity and others increased in metabolic activity as well size on the CT portion exam. 2. Expansion of lytic lesion at C3 with loss of a large portion of the vertebral body structure and intense metabolic activity. Recommend non emergent neuro surgical consultation for evaluation of this destructive C3 lesion. Dedicated CT or MRI of the neck may also provide more information. 3. Increase in size and metabolic activity of RIGHT iliac lesion and T5 vertebral body lesion. 4. Marked improvement with reduction in metabolic activity of RIGHT humerus and scapular metastatic lesions as well as marked reduction in metabolic activity large LEFT iliac bone lesion. 5. No evidence of soft tissue plasmacytoma or metastasis."  #2 severe hypercalcemia likely due to progression of his multiple myeloma.  Mild confusion and significant dehydration.  #3 severe uncontrolled epistaxis likely from dry mucous membranes plus severe thrombocytopenia   PLAN: -Discussed lab  work today with the patient and his wife. -1 unit PRBCs already ordered today.  His hemoglobin is down to 5.6.  I have requested for the blood bank to repair 2 additional units today.  However will transfuse only 2 units total today.  We will hold the third unit if needed tomorrow. -Transfuse platelets as needed to keep platelets more than 20k   in the setting of severe epistaxis. 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 -Continue aggressive IV hydration for his hypercalcemia. -He is status post dexamethasone 12 mg and Zometa to try to help control his hypercalcemia.  Calcium level is trending down.  The patient's wife indicates that he was taking calcium and vitamin D at home and I have advised her that he should stop taking this at this time. -Bone scan to evaluate for significance of bone lesions. -Will need likely bone marrow biopsy repeated. -Patient has been following with Dr. Blinda Leatherwood at [facility] to determine clinical trial options. -We will discuss with him other salvage treatment options based on goals of care.   Mikey Bussing, DNP, AGPCNP-BC, AOCNP  01/12/2019 11:45 AM   ADDENDUM  .Patient was Personally and independently interviewed, examined and relevant elements of the history of present illness were reviewed in details and an assessment and plan was created. All elements of the patient's history of present illness , assessment and plan were discussed in details with Mikey Bussing, DNP. The above documentation reflects our combined findings assessment and plan.   Patient was confused and somnolent in the early afternoon. Revisited in the evening with wife at bedside. Patient awake and alert. Notes muscle cramps. Wife feeding him. Improved po intake. Receiving PRBC and IVF. PLAN -discussed goals of care and updated wife and patient at bedside regarding concerning findings for myeloma  progression. -Bone scan today show active bone disease -consent obtained for CT bone marrow biopsy. -transfuse prn for hgb<8 or if symptomatic. *Anemia due to severe epistaxis and myeloma" -transfuse plt to keep >20k -will continue to f/u regarding goals of care and possible treatment approaches.  Sullivan Lone MD MS

## 2019-01-12 NOTE — Progress Notes (Signed)
Pt appears extremely lethargic and has brief moments of LOC, A&O x2, to self and place, weakness of upper extremities, unable to hold objects. Appears drowsy, falls asleep during verbalizing. VS stables, Critical hgb 5.6- awaiting on blood arrival from [place] location for 1 unit of PRBC transfusion. Rapid called, MD paged.  Per MD; pt is extremely drowsy; hgb critically low. Continue to monitor.  Per RR; symptoms most likely related to critically low hgb. Continue to monitor.

## 2019-01-12 NOTE — Progress Notes (Signed)
PROGRESS NOTE    Jesus [name]  JXB:[id number] DOB: Feb 23, 1955 DOA: 01/11/2019 PCP: Patient, No Pcp Per   Brief Narrative:  Patient is a 63 year old male with history of multiple myeloma, arthritis, cervical stenosis who presented with nosebleed.  He had a similar episode about 2 weeks ago which lasted for 8 hours but spontaneously resolved.  He follows with oncologist at [facility] to have severe thrombocytopenia and presentation and also has normocytic anemia.  Hematology/oncology consulted.  Nosebleed has stopped.  Being transfused today for anemia.  Also found to have hypercalcemia secondary to MM and was given a dose of Zometa and Decadron.  Assessment & Plan:   Active Problems:   Hypercalcemia   Epistaxis   Thrombocytopenia (HCC)   Multiple myeloma: Follows with Dr Irene Limbo and also  Dr. Blinda Leatherwood ,,oncology, at [facility].  Has history of recent C3 compression fracture status post corpectomy, C2-C4 plating.  Oncology recommended bone scan.  Hypercalcemia: Secondary to malignancy.  Calcium level improving.  Continue aggressive  IV fluids.  Given Zometa and Decadron.  Pancytopenia: Secondary to malignancy.  Hemoglobin this morning the range of 5.  Being transfused with PRBCs.  Also transfused with a unit of  platelets.  Transfuse platelets if is less than 20000.  Nosebleed: Secondary to severe thrombocytopenia.  Stopped.  Severe protein calorie malnutrition: Will request for dietician  evaluation            DVT prophylaxis:SCD Code Status: Full Family Communication: Called wife on and gave update Disposition Plan: Home after full work-up, improvement in the pancytopenia   Consultants: Oncology  Procedures: None  Antimicrobials:  Anti-infectives (From admission, onward)   None      Subjective:  Patient seen and examined the bedside this morning.  Appears very drowsy, sleepy during my evaluation.  Woke up after calling his name.  Nosebleed has  stopped.  Hemodynamically stable.  Denies any complaints.  Objective: Vitals:   01/11/19 2035 01/12/19 0500 01/12/19 0538 01/12/19 0948  BP: 127/71  117/73 (!) 107/53  Pulse: 99  84 85  Resp: _0 Temp: 98.5 F (36.9 C)  (!) 97.5 F (36.4 C)   TempSrc: Oral  Oral   SpO2: 100%  100% 100%  Weight:  49.1 kg    Height:  5' 7" (1.702 m)      Intake/Output Summary (Last 24 hours) at 01/12/2019 1243 Last data filed at 01/12/2019 1000 Gross per 24 hour  Intake 3780.61 ml  Output 700 ml  Net 3080.61 ml   Filed Weights   01/12/19 0500  Weight: 49.1 kg    Examination:  General exam: generalized weakness,thin built HEENT:PERRL,dry blood on the nnares Respiratory system: Bilateral equal air entry, normal vesicular breath sounds, no wheezes or crackles  Cardiovascular system: S1 & S2 heard, RRR. No JVD, murmurs, rubs, gallops or clicks. No pedal edema. Gastrointestinal system: Abdomen is nondistended, soft and nontender. No organomegaly or masses felt. Normal bowel sounds heard. Central nervous system: Alert and oriented. No focal neurological deficits. Extremities: No edema, no clubbing ,no cyanosis, distal peripheral pulses palpable. Skin: No rashes, lesions or ulcers,no icterus ,no pallor   Data Reviewed: I have personally reviewed following labs and imaging studies  CBC: Recent Labs  Lab 01/11/19 1349 01/12/19 0400 01/12/19 0859  WBC 1.9* 1.5* 1.3*  NEUTROABS 0.8*  --   --   HGB 8.6* 6.0* 5.6*  HCT 26.6* 18.9* 17.6*  MCV 94.7 97.4 97.8  PLT  9* 37* 29*   Basic Metabolic Panel: Recent Labs  Lab 01/11/19 1349 01/12/19 0400  NA 137 139  K 3.4* 3.7  CL 97* 103  CO2 32 30  GLUCOSE 117* 154*  BUN 26* 23  CREATININE 1.14 0.97  CALCIUM >15.0* 13.7*   GFR: Estimated Creatinine Clearance: 54.1 mL/min (by C-G formula based on SCr of 0.97 mg/dL). Liver Function Tests: Recent Labs  Lab 01/11/19 1349  AST 33  ALT 20  ALKPHOS 74  BILITOT 0.5  PROT 7.7   ALBUMIN 3.4*   No results for input(s): LIPASE, AMYLASE in the last 168 hours. No results for input(s): AMMONIA in the last 168 hours. Coagulation Profile: Recent Labs  Lab 01/11/19 1349  INR 1.0   Cardiac Enzymes: No results for input(s): CKTOTAL, CKMB, CKMBINDEX, TROPONINI in the last 168 hours. BNP (last 3 results) No results for input(s): PROBNP in the last 8760 hours. HbA1C: No results for input(s): HGBA1C in the last 72 hours. CBG: Recent Labs  Lab 01/12/19 0953  GLUCAP 124*   Lipid Profile: No results for input(s): CHOL, HDL, LDLCALC, TRIG, CHOLHDL, LDLDIRECT in the last 72 hours. Thyroid Function Tests: No results for input(s): TSH, T4TOTAL, FREET4, T3FREE, THYROIDAB in the last 72 hours. Anemia Panel: No results for input(s): VITAMINB12, FOLATE, FERRITIN, TIBC, IRON, RETICCTPCT in the last 72 hours. Sepsis Labs: No results for input(s): PROCALCITON, LATICACIDVEN in the last 168 hours.  Recent Results (from the past 240 hour(s))  SARS CORONAVIRUS 2 (TAT 6-24 HRS) Nasopharyngeal Nasopharyngeal Swab     Status: None   Collection Time: 01/11/19  3:32 PM   Specimen: Nasopharyngeal Swab  Result Value Ref Range Status   SARS Coronavirus 2 NEGATIVE NEGATIVE Final    Comment: (NOTE) SARS-CoV-2 target nucleic acids are NOT DETECTED. The SARS-CoV-2 RNA is generally detectable in upper and lower respiratory specimens during the acute phase of infection. Negative results do not preclude SARS-CoV-2 infection, do not rule out co-infections with other pathogens, and should not be used as the sole basis for treatment or other patient management decisions. Negative results must be combined with clinical observations, patient history, and epidemiological information. The expected result is Negative. Fact Sheet for Patients: SugarRoll.be Fact Sheet for Healthcare Providers: https://www.woods-mathews.com/ This test is not yet approved or  cleared by the [place] FDA and  has been authorized for detection and/or diagnosis of SARS-CoV-2 by FDA under an Emergency Use Authorization (EUA). This EUA will remain  in effect (meaning this test can be used) for the duration of the COVID-19 declaration under Section 56 4(b)(1) of the Act, 21 U.S.C. section 360bbb-3(b)(1), unless the authorization is terminated or revoked sooner. Performed at [facility], [place] [address] [place], [place] [address]          Radiology Studies: No results found.      Scheduled Meds: . sodium chloride   Intravenous Once  . Chlorhexidine Gluconate Cloth  6 each Topical Daily  . docusate sodium  100 mg Oral BID  . dronabinol  10 mg Oral BID  . gabapentin  300 mg Oral TID  . morphine  30 mg Oral Q12H  . oxymetazoline  1 spray Each Nare BID  . senna-docusate  1 tablet Oral Daily  . sucralfate  1 g Oral TID AC & HS  . tranexamic acid  500 mg Topical Once   Continuous Infusions: . sodium chloride 250 mL/hr at 01/12/19 0600     LOS: 0 days  Time spent: 25 mins.More than 50% of that time was spent in counseling and/or coordination of care.      Shelly Coss, MD [facility] Pager [phone]  If 7PM-7AM, please contact night-coverage [email or web address] Password [facility] 01/12/2019, 12:43 PM

## 2019-01-12 NOTE — Progress Notes (Signed)
Initial Nutrition Assessment  DOCUMENTATION CODES:   Underweight  INTERVENTION:   -Boost Breeze po TID, each supplement provides 250 kcal and 9 grams of protein -Prostat liquid protein PO 30 ml BID with meals, each supplement provides 100 kcal, 15 grams protein.  NUTRITION DIAGNOSIS:   Increased nutrient needs related to cancer and cancer related treatments as evidenced by estimated needs.  GOAL:   Patient will meet greater than or equal to 90% of their needs  MONITOR:   PO intake, Supplement acceptance, Labs, Weight trends, I & O's  REASON FOR ASSESSMENT:   Malnutrition Screening Tool, Consult Assessment of nutrition requirement/status  ASSESSMENT:   63 year old male with history of multiple myeloma, arthritis, cervical stenosis who presented with nosebleed.  He had a similar episode about 2 weeks ago which lasted for 8 hours but spontaneously resolved.  He follows with oncologist at [facility] to have severe thrombocytopenia and presentation and also has normocytic anemia. Admitted for hypercalcemia.  **RD working remotely**  Patient currently alert/oriented x 2, somewhat confused. Pt with history of multiple myeloma, s/p stem cell transplant in 2019.  Patient ate 100% of breakfast today (providing 670 kcals and 13g protein). Lunch has been ordered of meatloaf and mashed potatoes.  Will order Boost Breeze (provides only 10 mg Ca) and Prostat supplements for additional kcals and protein.   Per weight records, pt has lost 14 lbs since 9/18 (11% wt loss x 3 months, significant for time frame).  I/Os: +3.9L since admit UOP: 700 ml x 24 hrs  Medications: Marinol capsule, Carafate tablet Labs reviewed:  CBGs: 124 Calcium 13.7- trending down  NUTRITION - FOCUSED PHYSICAL EXAM:  Working remotely.  Diet Order:   Diet Order            Diet regular Room service appropriate? Yes; Fluid consistency: Thin  Diet effective now              EDUCATION NEEDS:    Not appropriate for education at this time  Skin:  Skin Assessment: Reviewed RN Assessment  Last BM:  12/7  Height:   Ht Readings from Last 1 Encounters:  01/12/19 _0  (1.702 m)    Weight:   Wt Readings from Last 1 Encounters:  01/12/19 49.1 kg    Ideal Body Weight:  67.2 kg  BMI:  Body mass index is 16.95 kg/m.  Estimated Nutritional Needs:   Kcal:  1550-1750  Protein:  75-85g  Fluid:  1.8L/day  Clayton Bibles, MS, RD, LDN Inpatient Clinical Dietitian Pager: [phone] After Hours Pager: [phone]

## 2019-01-12 NOTE — Significant Event (Signed)
Rapid Response Event Note  Overview: Time Called: [id number] Arrival Time: 0943 Event Type: Neurologic  Initial Focused Assessment:  RRT called due to new onset lethargy. On RRT arrival patient awake but drowsy. Oriented to self and place. Patient follows commands, equal grip, and answers questions correctly. CBG spot checked- 124. Patient had not received narcotics, has been receiving Marinol. Pupils, round, equal, and reactive. Critical value called to bedside RN by lab, Hgb 5.6. MD has already ordered transfusion. Blood in route from [place] blood [facility]. Patient now more alert ordering his breakfast. RRT available as needed for any status change.    Event Summary: Name of Physician Notified: Dessie Coma MD at 0945    at    Outcome: Stayed in room and stabalized  Event End Time: [place]

## 2019-01-12 NOTE — Progress Notes (Signed)
CRITICAL VALUE ALERT  Critical Value:  Hgb 6.0, Ca+ 13.7  Date & Time Notied:  01/12/19 05:15  Provider Notified: Lorra Hals   Orders Received/Actions taken: Repeat Hgb

## 2019-01-13 ENCOUNTER — Inpatient Hospital Stay: Payer: Medicare Other

## 2019-01-13 ENCOUNTER — Inpatient Hospital Stay: Payer: Medicare Other | Admitting: Hematology

## 2019-01-13 DIAGNOSIS — D709 Neutropenia, unspecified: Secondary | ICD-10-CM

## 2019-01-13 DIAGNOSIS — D72819 Decreased white blood cell count, unspecified: Secondary | ICD-10-CM

## 2019-01-13 DIAGNOSIS — D649 Anemia, unspecified: Secondary | ICD-10-CM

## 2019-01-13 LAB — CBC WITH DIFFERENTIAL/PLATELET
Abs Immature Granulocytes: 0.01 10*3/uL (ref 0.00–0.07)
Basophils Absolute: 0 10*3/uL (ref 0.0–0.1)
Basophils Relative: 0 %
Eosinophils Absolute: 0 10*3/uL (ref 0.0–0.5)
Eosinophils Relative: 0 %
HCT: 25.8 % — ABNORMAL LOW (ref 39.0–52.0)
Hemoglobin: 8.6 g/dL — ABNORMAL LOW (ref 13.0–17.0)
Immature Granulocytes: 1 %
Lymphocytes Relative: 34 %
Lymphs Abs: 0.3 10*3/uL — ABNORMAL LOW (ref 0.7–4.0)
MCH: 29.4 pg (ref 26.0–34.0)
MCHC: 33.3 g/dL (ref 30.0–36.0)
MCV: 88.1 fL (ref 80.0–100.0)
Monocytes Absolute: 0.1 10*3/uL (ref 0.1–1.0)
Monocytes Relative: 15 %
Neutro Abs: 0.5 10*3/uL — ABNORMAL LOW (ref 1.7–7.7)
Neutrophils Relative %: 50 %
Platelets: 22 10*3/uL — CL (ref 150–400)
RBC: 2.93 MIL/uL — ABNORMAL LOW (ref 4.22–5.81)
RDW: 21.3 % — ABNORMAL HIGH (ref 11.5–15.5)
WBC: 1 10*3/uL — CL (ref 4.0–10.5)
nRBC: 0 % (ref 0.0–0.2)

## 2019-01-13 LAB — TYPE AND SCREEN
ABO/RH(D): A POS
Antibody Screen: POSITIVE
Unit division: 0
Unit division: 0

## 2019-01-13 LAB — COMPREHENSIVE METABOLIC PANEL
ALT: 16 U/L (ref 0–44)
AST: 25 U/L (ref 15–41)
Albumin: 2.6 g/dL — ABNORMAL LOW (ref 3.5–5.0)
Alkaline Phosphatase: 59 U/L (ref 38–126)
Anion gap: 5 (ref 5–15)
BUN: 25 mg/dL — ABNORMAL HIGH (ref 8–23)
CO2: 27 mmol/L (ref 22–32)
Calcium: 12.1 mg/dL — ABNORMAL HIGH (ref 8.9–10.3)
Chloride: 107 mmol/L (ref 98–111)
Creatinine, Ser: 1.01 mg/dL (ref 0.61–1.24)
GFR calc Af Amer: >60 mL/min (ref >60)
GFR calc non Af Amer: >60 mL/min (ref >60)
Glucose, Bld: 103 mg/dL — ABNORMAL HIGH (ref 70–99)
Potassium: 3 mmol/L — ABNORMAL LOW (ref 3.5–5.1)
Sodium: 139 mmol/L (ref 135–145)
Total Bilirubin: 0.5 mg/dL (ref 0.3–1.2)
Total Protein: 5.9 g/dL — ABNORMAL LOW (ref 6.5–8.1)

## 2019-01-13 LAB — BPAM RBC
Blood Product Expiration Date: 202101022359
Blood Product Expiration Date: 202101082359
ISSUE DATE / TIME: 202012101314
ISSUE DATE / TIME: 202012101652
Unit Type and Rh: 6200
Unit Type and Rh: 6200

## 2019-01-13 LAB — PROTIME-INR
INR: 1 (ref 0.8–1.2)
Prothrombin Time: 13 seconds (ref 11.4–15.2)

## 2019-01-13 LAB — KAPPA/LAMBDA LIGHT CHAINS
Kappa free light chain: 1.5 mg/L — ABNORMAL LOW (ref 3.3–19.4)
Kappa, lambda light chain ratio: 0.01 — ABNORMAL LOW (ref 0.26–1.65)
Lambda free light chains: 218.3 mg/L — ABNORMAL HIGH (ref 5.7–26.3)

## 2019-01-13 MED ORDER — POTASSIUM CHLORIDE 20 MEQ PO PACK
40.0000 meq | PACK | Freq: Two times a day (BID) | ORAL | Status: AC
  Administered 2019-01-13 (×2): 40 meq via ORAL
  Filled 2019-01-13 (×2): qty 2

## 2019-01-13 MED ORDER — POTASSIUM CHLORIDE 20 MEQ PO PACK
40.0000 meq | PACK | Freq: Once | ORAL | Status: AC
  Administered 2019-01-13: 40 meq via ORAL
  Filled 2019-01-13: qty 2

## 2019-01-13 NOTE — Progress Notes (Signed)
Referring Physician(s): [place]  Supervising Physician: Jesus [name]  Patient Status:  [name]  Chief Complaint:  Nosebleed, anemia, multiple myeloma  Subjective: Pt familiar to IR service from prior BM bx on 06/30/18 and port a cath placement on 06/21/18. He has a hx of multiple myeloma with prior chemotherapy (currently daratumumab), and SCT. He presented to [facility] on 01/11/19 with nosebleed and severe hypercalcemia. He has received blood transfusion.  Current labs include WBC 1.0, HGB 8.6, PLTS 22K,K 3.0, calcium 12.1. Request now received from oncology for CT guided BM bx to rule out myeloma progression. He is somnolent, weak and is minimally engaged with any conversation. He denies pain or resp issues.   Past Medical History:  Diagnosis Date  . Arthritis   . Cancer ([place])    multiple myeloma  . Cervical stenosis of spinal canal   . Wears glasses    Past Surgical History:  Procedure Laterality Date  . ANTERIOR CERVICAL CORPECTOMY N/A 11/04/2018   Procedure: Cervical three Corpectomy with Cervical two to Cervical four Plating;  Surgeon: Erline Levine, MD;  Location: [facility] [place];  Service: Neurosurgery;  Laterality: N/A;  . APPENDECTOMY    . BACK SURGERY    . EYE SURGERY Bilateral    cataract surgery with lens implants  . HERNIA REPAIR Left    inguinal   . IR IMAGING GUIDED PORT INSERTION  06/21/2018  . ROTATOR CUFF REPAIR Left       Allergies: Penicillins  Medications: Prior to Admission medications   Medication Sig Start Date End Date Taking? Authorizing Provider  acetaminophen (TYLENOL) 325 MG tablet Take 325-650 mg by mouth every 6 (six) hours as needed (pain.).  09/07/16  Yes [provider]  acyclovir (ZOVIRAX) 400 MG tablet Take 1 tablet (400 mg total) by mouth 2 (two) times daily. 10/21/18  Yes Brunetta Genera, MD  Calcium Carb-Cholecalciferol (CALCIUM 600+D3) 600-800 MG-UNIT TABS Take 1 tablet by mouth daily.   Yes [provider]    dexamethasone (DECADRON) 4 MG tablet Take 5 tablets (6m) with breakfast the day after each dose of Daratumumab 06/15/18  Yes Kale, GCloria Spring MD  dronabinol (MARINOL) 10 MG capsule TAKE 1 CAPSULE BY MOUTH TWICE DAILY BEFORE MEAL(S) Patient taking differently: Take 10 mg by mouth 2 (two) times daily before a meal.  11/28/18  Yes KBrunetta Genera MD  ergocalciferol (VITAMIN D2) 1.25 MG (50000 UT) capsule Take 1 capsule (50,000 Units total) by mouth 2 (two) times a week. 09/29/18  Yes KBrunetta Genera MD  morphine (MS CONTIN) 30 MG 12 hr tablet Take 1 tablet (30 mg total) by mouth every 12 (twelve) hours. 11/29/18  Yes PHayden Pedro PA-C  ondansetron (ZOFRAN) 4 MG tablet Take 1 tablet (4 mg total) by mouth every 8 (eight) hours as needed for nausea or vomiting. 04/20/18  Yes KBrunetta Genera MD  oxyCODONE (ROXICODONE) 15 MG immediate release tablet Take 1 tablet (15 mg total) by mouth every 4 (four) hours as needed for severe pain. 11/29/18  Yes PHayden Pedro PA-C  senna-docusate (SENOKOT-S) 8.6-50 MG tablet Take 1 tablet by mouth daily.  08/31/16  Yes [provider]  gabapentin (NEURONTIN) 300 MG capsule Take 1 capsule (300 mg total) by mouth 3 (three) times daily. Patient not taking: Reported on 01/11/2019 11/07/18   SErline Levine MD  methocarbamol (ROBAXIN) 500 MG tablet Take 1 tablet (500 mg total) by mouth every 6 (six) hours as needed for muscle spasms.  Patient not taking: Reported on 01/11/2019 11/07/18   Erline Levine, MD  sucralfate (CARAFATE) 1 g tablet Take 1 tablet (1 g total) by mouth 4 (four) times daily. Patient not taking: Reported on 01/11/2019 07/22/18   Kyung Rudd, MD     Vital Signs: BP 97/67 (BP Location: Left Arm)   Pulse 91   Temp 98.4 F (36.9 C) (Oral)   Resp 17   Ht _0  (1.702 m)   Wt 116 lb 6.5 oz (52.8 kg)   SpO2 100%   BMI 18.23 kg/m   Physical Exam lethargic/somnolent, eyes barely open, responds minimally to  questions; chest-  CTA bilat; rt chest wall port in place; heart- RRR; abd- soft,+BS,NT; no LE edema Imaging: NM Bone Scan Whole Body  Result Date: 01/12/2019 CLINICAL DATA:  History of multiple myeloma with acute back pain. EXAM: NUCLEAR MEDICINE WHOLE BODY BONE SCAN TECHNIQUE: Whole body anterior and posterior images were obtained approximately 3 hours after intravenous injection of radiopharmaceutical. RADIOPHARMACEUTICALS:  21.8 mCi Technetium-83mMDP IV COMPARISON:  PET-CT 10/13/2018 FINDINGS: Examination demonstrates patchy increased uptake over the mid to lower thoracic spine over known areas of fusion hardware and bone destruction representing patient's known multiple myeloma. Mild uptake over the right acetabulum likely representing patient's multiple myeloma. Minimal hazy uptake on the anterior image at the cervicothoracic junction which may represent patient's known myelomatous involvement of the cervical spine. Minimal patchy uptake over several anterior upper ribs bilaterally which may be due to patient's myomatous disease. Minimal degenerative changes over the shoulders and knees. IMPRESSION: 1. Uptake over the right acetabulum, mid to lower thoracic spine, upper anterior ribs and possibly cervicothoracic junction likely representing patient's known multiple myeloma. 2.  Degenerative changes as described. Electronically Signed   By: DMarin OlpM.D.   On: 01/12/2019 13:23    Labs:  CBC: Recent Labs    01/11/19 1349 01/12/19 0400 01/12/19 0859 01/13/19 0443  WBC 1.9* 1.5* 1.3* 1.0*  HGB 8.6* 6.0* 5.6* 8.6*  HCT 26.6* 18.9* 17.6* 25.8*  PLT 9* 37* 29* 22*    COAGS: Recent Labs    06/21/18 0846 01/11/19 1349  INR 1.0 1.0  APTT  --  26    BMP: Recent Labs    12/30/18 0940 01/11/19 1349 01/12/19 0400 01/13/19 0443  NA 137 137 139 139  K 3.5 3.4* 3.7 3.0*  CL 101 97* 103 107  CO2 27 32 30 27  GLUCOSE 134* 117* 154* 103*  BUN 6* 26* 23 25*  CALCIUM 11.3* >15.0*  13.7* 12.1*  CREATININE 0.76 1.14 0.97 1.01  GFRNONAA >60 >60 >60 >60  GFRAA >60 >60 >60 >60    LIVER FUNCTION TESTS: Recent Labs    12/15/18 0958 12/30/18 0940 01/11/19 1349 01/13/19 0443  BILITOT 0.5 0.4 0.5 0.5  AST 15 23 33 25  ALT _1 ALKPHOS 77 74 74 59  PROT 7.4 7.5 7.7 5.9*  ALBUMIN 3.8 3.6 3.4* 2.6*    Assessment and Plan: Pt with hx of multiple myeloma with prior chemotherapy (currently daratumumab), and SCT. [facility]. He has received blood transfusion.  Current labs include WBC 1.0, HGB 8.6, PLTS 22K,K 3.0, calcium 12.1. Request now received from oncology for CT guided BM bx to rule out myeloma progression.Risks and benefits of procedure was discussed with the patient's spouse including, but not limited to bleeding, infection, damage to adjacent structures or low yield requiring additional tests.  All of the questions were answered and there is agreement to proceed.  Consent signed and in chart.  Procedure scheduled for 12/14   Electronically Signed: D. Rowe Robert, PA-C 01/13/2019, 3:35 PM   I spent a total of  20 minutes at the the patient's bedside AND on the patient's hospital floor or unit, greater than 50% of which was counseling/coordinating care for CT guided bone marrow biopsy    Patient ID: Jesus [name], male   DOB: 1955/07/04, 63 y.o.   MRN: [id number]

## 2019-01-13 NOTE — Progress Notes (Signed)
PROGRESS NOTE    DARCELL SABINO  IRJ:[id number] DOB: 1955-04-03 DOA: 01/11/2019 PCP: Patient, No Pcp Per    Brief Narrative:  Patient is a 63 year old male with history of multiple myeloma, arthritis, cervical stenosis who presented with nosebleed.  He had a similar episode about 2 weeks ago which lasted for 8 hours but spontaneously resolved.  He follows with oncologist at [place] to have severe thrombocytopenia at presentation and also has normocytic anemia.  Hematology/oncology consulted.  Nosebleed has stopped.  Being transfused today for anemia.  Also found to have hypercalcemia secondary to MM and was given a dose of Zometa and Decadron.  Assessment & Plan:   Active Problems:   Hypercalcemia   Epistaxis   Thrombocytopenia (HCC)   Anemia   Leukopenia   Multiple myeloma: Follows with Dr Irene Limbo and also  Dr. Blinda Leatherwood ,,oncology, at [facility].  Has history of recent C3 compression fracture status post corpectomy, C2-C4 plating.  Oncology following, suggest bone marrow biopsy by IR today. Bone scan: Uptake over the right acetabulum, mid to lower thoracic spine, upper anterior ribs and possibly cervicothoracic junction likely representing patient's known multiple myeloma  Hypercalcemia: Secondary to malignancy.  Calcium level improving.  Continue aggressive  IV fluids.  Given Zometa and Decadron.  Pancytopenia: Secondary to malignancy.  Hemoglobin this morning 8.6.  S/p 1 PRBC.  Also transfused with a unit of  platelets.  Transfuse platelets if is less than 20000.  Nosebleed: Secondary to severe thrombocytopenia.  Stopped.  Severe protein calorie malnutrition: Will request for dietician  evaluation   DVT prophylaxis:SCD Code Status: Full Family Communication: Called wife on and gave update Disposition Plan: Home after full work-up, improvement in the pancytopenia  Consultants:   Oncology  Procedures: Scheduled to have a bone marrow biopsy by IR  today.  Antimicrobials: Anti-infectives (From admission, onward)   None      Subjective: Patient was seen and examined at bedside.  He appears comfortable, hemodynamically stable, denies any nosebleed, nurse reported no overnight events.  Objective: Vitals:   01/12/19 2009 01/13/19 0415 01/13/19 0447 01/13/19 1411  BP: 106/67 112/66  97/67  Pulse: 90 83  91  Resp: _0 Temp: 98.2 F (36.8 C) 97.8 F (36.6 C)  98.4 F (36.9 C)  TempSrc: Oral Oral  Oral  SpO2: 100% 99%  100%  Weight:   52.8 kg   Height:        Intake/Output Summary (Last 24 hours) at 01/13/2019 1443 Last data filed at 01/13/2019 3016 Gross per 24 hour  Intake 1418.96 ml  Output 2300 ml  Net -881.04 ml   Filed Weights   01/12/19 0500 01/13/19 0447  Weight: 49.1 kg 52.8 kg    Examination:  General exam: Appears calm and comfortable, thin built appears generally weak. Respiratory system: Clear to auscultation. Respiratory effort normal. Cardiovascular system: S1 & S2 heard, RRR. No JVD, murmurs, rubs, gallops or clicks. No pedal edema. Gastrointestinal system: Abdomen is nondistended, soft and nontender. No organomegaly or masses felt. Normal bowel sounds heard. Central nervous system: Alert and oriented. No focal neurological deficits. Extremities: No edema, no clubbing Skin: No rashes, lesions or ulcers Psychiatry: Judgement and insight appear normal. Mood & affect appropriate.     Data Reviewed: I have personally reviewed following labs and imaging studies  CBC: Recent Labs  Lab 01/11/19 1349 01/12/19 0400 01/12/19 0859 01/13/19 0443  WBC 1.9* 1.5* 1.3* 1.0*  NEUTROABS 0.8*  --   --  0.5*  HGB 8.6* 6.0* 5.6* 8.6*  HCT 26.6* 18.9* 17.6* 25.8*  MCV 94.7 97.4 97.8 88.1  PLT 9* 37* 29* 22*   Basic Metabolic Panel: Recent Labs  Lab 01/11/19 1349 01/12/19 0400 01/13/19 0443  NA 137 139 139  K 3.4* 3.7 3.0*  CL 97* 103 107  CO2 32 30 27  GLUCOSE 117* 154* 103*  BUN 26* 23  25*  CREATININE 1.14 0.97 1.01  CALCIUM >15.0* 13.7* 12.1*   GFR: Estimated Creatinine Clearance: 55.9 mL/min (by C-G formula based on SCr of 1.01 mg/dL). Liver Function Tests: Recent Labs  Lab 01/11/19 1349 01/13/19 0443  AST 33 25  ALT 20 16  ALKPHOS 74 59  BILITOT 0.5 0.5  PROT 7.7 5.9*  ALBUMIN 3.4* 2.6*   No results for input(s): LIPASE, AMYLASE in the last 168 hours. No results for input(s): AMMONIA in the last 168 hours. Coagulation Profile: Recent Labs  Lab 01/11/19 1349  INR 1.0   Cardiac Enzymes: No results for input(s): CKTOTAL, CKMB, CKMBINDEX, TROPONINI in the last 168 hours. BNP (last 3 results) No results for input(s): PROBNP in the last 8760 hours. HbA1C: No results for input(s): HGBA1C in the last 72 hours. CBG: Recent Labs  Lab 01/12/19 0953  GLUCAP 124*   Lipid Profile: No results for input(s): CHOL, HDL, LDLCALC, TRIG, CHOLHDL, LDLDIRECT in the last 72 hours. Thyroid Function Tests: No results for input(s): TSH, T4TOTAL, FREET4, T3FREE, THYROIDAB in the last 72 hours. Anemia Panel: No results for input(s): VITAMINB12, FOLATE, FERRITIN, TIBC, IRON, RETICCTPCT in the last 72 hours. Sepsis Labs: No results for input(s): PROCALCITON, LATICACIDVEN in the last 168 hours.  Recent Results (from the past 240 hour(s))  SARS CORONAVIRUS 2 (TAT 6-24 HRS) Nasopharyngeal Nasopharyngeal Swab     Status: None   Collection Time: 01/11/19  3:32 PM   Specimen: Nasopharyngeal Swab  Result Value Ref Range Status   SARS Coronavirus 2 NEGATIVE NEGATIVE Final    Comment: (NOTE) SARS-CoV-2 target nucleic acids are NOT DETECTED. The SARS-CoV-2 RNA is generally detectable in upper and lower respiratory specimens during the acute phase of infection. Negative results do not preclude SARS-CoV-2 infection, do not rule out co-infections with other pathogens, and should not be used as the sole basis for treatment or other patient management decisions. Negative results  must be combined with clinical observations, patient history, and epidemiological information. The expected result is Negative. Fact Sheet for Patients: SugarRoll.be Fact Sheet for Healthcare Providers: https://www.woods-mathews.com/ This test is not yet approved or cleared by the [place] FDA and  has been authorized for detection and/or diagnosis of SARS-CoV-2 by FDA under an Emergency Use Authorization (EUA). This EUA will remain  in effect (meaning this test can be used) for the duration of the COVID-19 declaration under Section 56 4(b)(1) of the Act, 21 U.S.C. section 360bbb-3(b)(1), unless the authorization is terminated or revoked sooner. Performed at [facility], [place] [address], [place], [place] [address]    Radiology Studies: NM Bone Scan Whole Body  Result Date: 01/12/2019 CLINICAL DATA:  History of multiple myeloma with acute back pain. EXAM: NUCLEAR MEDICINE WHOLE BODY BONE SCAN TECHNIQUE: Whole body anterior and posterior images were obtained approximately 3 hours after intravenous injection of radiopharmaceutical. RADIOPHARMACEUTICALS:  21.8 mCi Technetium-43mMDP IV COMPARISON:  PET-CT 10/13/2018 FINDINGS: Examination demonstrates patchy increased uptake over the mid to lower thoracic spine over known areas of fusion hardware and bone destruction representing patient's known multiple myeloma. Mild uptake  over the right acetabulum likely representing patient's multiple myeloma. Minimal hazy uptake on the anterior image at the cervicothoracic junction which may represent patient's known myelomatous involvement of the cervical spine. Minimal patchy uptake over several anterior upper ribs bilaterally which may be due to patient's myomatous disease. Minimal degenerative changes over the shoulders and knees. IMPRESSION: 1. Uptake over the right acetabulum, mid to lower thoracic spine, upper anterior ribs and possibly cervicothoracic  junction likely representing patient's known multiple myeloma. 2.  Degenerative changes as described. Electronically Signed   By: Marin Olp M.D.   On: 01/12/2019 13:23   Scheduled Meds: . sodium chloride   Intravenous Once  . sodium chloride   Intravenous Once  . Chlorhexidine Gluconate Cloth  6 each Topical Daily  . docusate sodium  100 mg Oral BID  . dronabinol  10 mg Oral BID  . feeding supplement  1 Container Oral TID BM  . feeding supplement (PRO-STAT SUGAR FREE 64)  30 mL Oral BID  . gabapentin  300 mg Oral TID  . morphine  30 mg Oral Q12H  . oxymetazoline  1 spray Each Nare BID  . potassium chloride  40 mEq Oral BID  . potassium chloride  40 mEq Oral Once  . senna-docusate  1 tablet Oral Daily  . sucralfate  1 g Oral TID AC & HS  . tranexamic acid  500 mg Topical Once   Continuous Infusions: . sodium chloride 125 mL/hr at 01/13/19 1216     LOS: 1 day    Time spent:     Shawna Clamp, MD [facility]   If 7PM-7AM, please contact night-coverage

## 2019-01-13 NOTE — Progress Notes (Addendum)
[name]   HEMATOLOGY/ONCOLOGY INPATIENT PROGRESS NOTE  Date of Service: 01/13/2019  Inpatient Attending: .Shawna Clamp, MD   SUBJECTIVE  Patient still having some periods of confusion and somnolence. No longer having epistaxis or any other bleeding.  He is not complaining of any pain at this time.  Received 2 units PRBCs on 01/12/2019 with improvement of his hemoglobin to 8.6.  Noted to be more neutropenic today with a total white count of 1.0 and ANC of 0.5.  He is not febrile.  Platelet count currently at 22,000 without active bleeding.  Bone marrow biopsy has been requested to be performed in interventional radiology hopefully later today. 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     OBJECTIVE:  PHYSICAL EXAMINATION: . Vitals:   01/12/19 1715 01/12/19 2009 01/13/19 0415 01/13/19 0447  BP: (!) 102/54 106/67 112/66   Pulse: 88 90 83   Resp: _0 Temp: 97.8 F (36.6 C) 98.2 F (36.8 C) 97.8 F (36.6 C)   TempSrc: Oral Oral Oral   SpO2: 100% 100% 99%   Weight:    116 lb 6.5 oz (52.8 kg)  Height:       Filed Weights   01/12/19 0500 01/13/19 0447  Weight: 108 lb 3.9 oz (49.1 kg) 116 lb 6.5 oz (52.8 kg)   .Body mass index is 18.23 kg/m.  GENERAL:alert, resolved epistaxis.  Looks comfortable SKIN: Decreased skin turgor suggesting dehydration EYES: Sunken eyes OROPHARYNX: Dry mucous membranes  NECK: supple, LYMPH:  no palpable lymphadenopathy in the cervical, axillary or inguinal LUNGS: clear to auscultation with normal respiratory effort HEART: regular rate & rhythm,  no murmurs and no lower extremity edema ABDOMEN: abdomen soft, non-tender, normoactive bowel sounds  Musculoskeletal: no cyanosis of digits and no clubbing  PSYCH: alert & somewhat confused and a little sleepy. NEURO: no focal motor/sensory deficits.  Noted to have intermittent tremors to his right arm.   MEDICAL HISTORY:  Past Medical History:   Diagnosis Date  . Arthritis   . Cancer ([place])    multiple myeloma  . Cervical stenosis of spinal canal   . Wears glasses     SURGICAL HISTORY: Past Surgical History:  Procedure Laterality Date  . ANTERIOR CERVICAL CORPECTOMY N/A 11/04/2018   Procedure: Cervical three Corpectomy with Cervical two to Cervical four Plating;  Surgeon: Erline Levine, MD;  Location: [facility];  Service: Neurosurgery;  Laterality: N/A;  . APPENDECTOMY    . BACK SURGERY    . EYE SURGERY Bilateral    cataract surgery with lens implants  . HERNIA REPAIR Left    inguinal   . IR IMAGING GUIDED PORT INSERTION  06/21/2018  . ROTATOR CUFF REPAIR Left     SOCIAL HISTORY: Social History   Socioeconomic History  . Marital status: Married    Spouse name: Not on file  . Number of children: Not on file  . Years of education: Not on file  . Highest education level: Not on file  Occupational History  . Not on file  Tobacco Use  . Smoking status: Never Smoker  . Smokeless tobacco: Never Used  Substance and Sexual Activity  . Alcohol use: Never  . Drug use: Never  . Sexual activity: Not on file  Other Topics Concern  . Not on file  Social History Narrative  . Not on file   Social Determinants of Health   Financial Resource Strain:   . Difficulty of  Paying Living Expenses: Not on file  Food Insecurity:   . Worried About Charity fundraiser in the Last Year: Not on file  . Ran Out of Food in the Last Year: Not on file  Transportation Needs: No Transportation Needs  . Lack of Transportation (Medical): No  . Lack of Transportation (Non-Medical): No  Physical Activity:   . Days of Exercise per Week: Not on file  . Minutes of Exercise per Session: Not on file  Stress:   . Feeling of Stress : Not on file  Social Connections:   . Frequency of Communication with Friends and Family: Not on file  . Frequency of Social Gatherings with Friends and Family: Not on file  . Attends Religious Services: Not on file  .  Active Member of Clubs or Organizations: Not on file  . Attends Archivist Meetings: Not on file  . Marital Status: Not on file  Intimate Partner Violence:   . Fear of Current or Ex-Partner: Not on file  . Emotionally Abused: Not on file  . Physically Abused: Not on file  . Sexually Abused: Not on file    FAMILY HISTORY: Family History  Problem Relation Age of Onset  . Breast cancer Sister     ALLERGIES:  is allergic to penicillins.  MEDICATIONS:  Scheduled Meds: . sodium chloride   Intravenous Once  . sodium chloride   Intravenous Once  . Chlorhexidine Gluconate Cloth  6 each Topical Daily  . docusate sodium  100 mg Oral BID  . dronabinol  10 mg Oral BID  . feeding supplement  1 Container Oral TID BM  . feeding supplement (PRO-STAT SUGAR FREE 64)  30 mL Oral BID  . gabapentin  300 mg Oral TID  . morphine  30 mg Oral Q12H  . oxymetazoline  1 spray Each Nare BID  . potassium chloride  40 mEq Oral BID  . senna-docusate  1 tablet Oral Daily  . sucralfate  1 g Oral TID AC & HS  . tranexamic acid  500 mg Topical Once   Continuous Infusions: . sodium chloride 125 mL/hr at 01/13/19 0414   PRN Meds:.  REVIEW OF SYSTEMS:    10 Point review of Systems was done is negative except as noted above.   LABORATORY DATA:  I have reviewed the data as listed  . CBC Latest Ref Rng & Units 01/13/2019 01/12/2019 01/12/2019  WBC 4.0 - 10.5 K/uL 1.0(LL) 1.3(LL) 1.5(L)  Hemoglobin 13.0 - 17.0 g/dL 8.6(L) 5.6(LL) 6.0(LL)  Hematocrit 39.0 - 52.0 % 25.8(L) 17.6(L) 18.9(L)  Platelets 150 - 400 K/uL 22(LL) 29(LL) 37(L)    . CMP Latest Ref Rng & Units 01/13/2019 01/12/2019 01/11/2019  Glucose 70 - 99 mg/dL 103(H) 154(H) 117(H)  BUN 8 - 23 mg/dL 25(H) 23 26(H)  Creatinine 0.61 - 1.24 mg/dL 1.01 0.97 1.14  Sodium 135 - 145 mmol/L 139 139 137  Potassium 3.5 - 5.1 mmol/L 3.0(L) 3.7 3.4(L)  Chloride 98 - 111 mmol/L 107 103 97(L)  CO2 22 - 32 mmol/L 27 30 32  Calcium 8.9 - 10.3  mg/dL 12.1(H) 13.7(HH) >15.0(HH)  Total Protein 6.5 - 8.1 g/dL 5.9(L) - 7.7  Total Bilirubin 0.3 - 1.2 mg/dL 0.5 - 0.5  Alkaline Phos 38 - 126 U/L 59 - 74  AST 15 - 41 U/L 25 - 33  ALT 0 - 44 U/L 16 - 20     RADIOGRAPHIC STUDIES: I have personally reviewed the radiological images as listed  and agreed with the findings in the report. NM Bone Scan Whole Body  Result Date: 01/12/2019 CLINICAL DATA:  History of multiple myeloma with acute back pain. EXAM: NUCLEAR MEDICINE WHOLE BODY BONE SCAN TECHNIQUE: Whole body anterior and posterior images were obtained approximately 3 hours after intravenous injection of radiopharmaceutical. RADIOPHARMACEUTICALS:  21.8 mCi Technetium-56mMDP IV COMPARISON:  PET-CT 10/13/2018 FINDINGS: Examination demonstrates patchy increased uptake over the mid to lower thoracic spine over known areas of fusion hardware and bone destruction representing patient's known multiple myeloma. Mild uptake over the right acetabulum likely representing patient's multiple myeloma. Minimal hazy uptake on the anterior image at the cervicothoracic junction which may represent patient's known myelomatous involvement of the cervical spine. Minimal patchy uptake over several anterior upper ribs bilaterally which may be due to patient's myomatous disease. Minimal degenerative changes over the shoulders and knees. IMPRESSION: 1. Uptake over the right acetabulum, mid to lower thoracic spine, upper anterior ribs and possibly cervicothoracic junction likely representing patient's known multiple myeloma. 2.  Degenerative changes as described. Electronically Signed   By: DMarin OlpM.D.   On: 01/12/2019 13:23    ASSESSMENT & PLAN:   63y.o. male with  1. 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 IgG at 2220.  08/11/16 PET/CT revealed Hypermetabolic large soft tissue mass in the lower thoracic paraspinal region associated with lytic destruction of T10 vertebral body, concerning for multiple myeloma. Additional smaller lytic lesions involving the skeleton. S/p RT x 4 fractions, discontinued due to T10 pathologic fracture with severe cord compression S/p 08/26/16 T10 corpectomy and T8-L1 posterior spinal fusion   Began 7 cycles of VRd on 09/24/16 S/p autologous stem cell transplant, Day 0 on 02/23/17 04/21/17 BM Bx with residual less than 5% lambda light chain restricted plasma cells in the bone marrow. M Spike at 0.4g Began maintenance 551mm2 Carfilzomib every 2 weeks on 06/16/17  06/09/18 PET/CT revealed "Multifocal hypermetabolic lytic lesions in the skeleton as noted above, compatible with active myeloma. In the spine, the most notable active lesion of concern is at the T12 level with there is a large right eccentric vertebral body lesion with demineralization of the cortex and likely some paravertebral extension of tumor. This could cause loosening of the right pedicle screw. Intraspinal extension of tumor is difficult to exclude. MRI might be considered although the posterolateral rod and pedicle screws may cause artifact. 2. There is likewise cortical breakthrough associated with the lytic lesion along the left upper acetabulum. 3. A lesion in the left intertrochanteric area of the femur is large enough to potentially cause biomechanical weakening which increases risk of fracture. 4. Other skeletal sites of active involvement are detailed above in the skeleton section. 5. Several lucent lesions are observed including the calvarium, T1 vertebral body, and left L4 pedicle which are not hypermetabolic and may represent effectively previously treated lesions. 6. 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 There is also a lipoma along the right  brachialis muscle. 7. Nonspecific subcutaneous edema especially in the right forearm but also extending up into the right upper arm. Cause is uncertain. There is some low-grade nonfocal metabolic activity along this area. Strictly speaking, cellulitis is not excluded, correlate with clinical history and visual inspection. 8.  Other imaging findings of potential clinical significance: Aortic Atherosclerosis. Mild cardiomegaly. Cholelithiasis. Prostatomegaly."  06/17/18 MMP revealed M Protein at 0.5g, just before beginning C1 Daratumumab  06/30/18 BM Biopsy revealed normocellular marrow with minimal involvement by plasma cell neoplasm (5% plasma cells), normal cytogenetics.  06/30/18 Molecular Cytogenetics did not have enough material for testing.  10/13/2018 PET whole body revealed "1. Mixed response to therapy with some lesions decreased significantly in metabolic activity and others increased in metabolic activity as well size on the CT portion exam. 2. Expansion of lytic lesion at C3 with loss of a large portion of the vertebral body structure and intense metabolic activity. Recommend non emergent neuro surgical consultation for evaluation of this destructive C3 lesion. Dedicated CT or MRI of the neck may also provide more information. 3. Increase in size and metabolic activity of RIGHT iliac lesion and T5 vertebral body lesion. 4. Marked improvement with reduction in metabolic activity of RIGHT humerus and scapular metastatic lesions as well as marked reduction in metabolic activity large LEFT iliac bone lesion. 5. No evidence of soft tissue plasmacytoma or metastasis."  #2 severe hypercalcemia likely due to progression of his multiple myeloma.  Mild confusion and significant dehydration.  #3 severe uncontrolled epistaxis likely from dry mucous membranes plus severe thrombocytopenia   PLAN: -Discussed lab work today with the patient.  No family at the bedside. - status post 2 units PRBCs on  01/12/2019 with improvement of his hemoglobin to 8.6.  No additional transfusion indicated today.  Continue to monitor CBC closely and transfuse for hemoglobin less than 8 or symptomatic. -Transfuse platelets as needed to keep platelets more than 20k in the setting of severe epistaxis.  Will hold on platelet transfusion today. 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  Bone marrow biopsy has been ordered to be performed in interventional radiology later today. -Continue aggressive IV hydration for his hypercalcemia. -He is status post dexamethasone 12 mg and Zometa to try to help control his hypercalcemia.  Calcium level is trending down.   -Bone scan shows active bone disease. -Patient has been following with Dr. Blinda Leatherwood at [facility] to determine clinical trial options. -We will discuss with him other salvage treatment options based on goals of care.   Mikey Bussing, DNP, AGPCNP-BC, AOCNP  01/13/2019 10:16 AM   ADDENDUM  .Patient was Personally and independently interviewed, examined and relevant elements of the history of present illness were reviewed in details and an assessment and plan was created. All elements of the patient's history of present illness , assessment and plan were discussed in details with Mikey Bussing, DNP. The above documentation reflects our combined findings assessment and plan. -Bone marrow biopsy scheduled for 01/16/2019. -Calcium levels trending down. Monitor daily. IV fluids per hospitalist. Could additionally consider calcitonin if needed or repeat additional dose of dexamethasone. -Treatment options going ahead would be single agent bendamustine versus PACE vs [place]-mafodotin vs other clinical trials at [facility]. -will f/u on Monday. -transfusion support and IVF over the weekend -PT/OT evaluation -if any new acute questions call -on call physician over the weekend.   Sullivan Lone MD MS

## 2019-01-14 ENCOUNTER — Encounter (HOSPITAL_COMMUNITY): Payer: Self-pay | Admitting: Internal Medicine

## 2019-01-14 DIAGNOSIS — D599 Acquired hemolytic anemia, unspecified: Secondary | ICD-10-CM

## 2019-01-14 LAB — CALCIUM, IONIZED: Calcium, Ionized, Serum: 7.6 mg/dL — ABNORMAL HIGH (ref 4.5–5.6)

## 2019-01-14 LAB — CBC
HCT: 30.1 % — ABNORMAL LOW (ref 39.0–52.0)
Hemoglobin: 9.7 g/dL — ABNORMAL LOW (ref 13.0–17.0)
MCH: 28.9 pg (ref 26.0–34.0)
MCHC: 32.2 g/dL (ref 30.0–36.0)
MCV: 89.6 fL (ref 80.0–100.0)
Platelets: 18 10*3/uL — CL (ref 150–400)
RBC: 3.36 MIL/uL — ABNORMAL LOW (ref 4.22–5.81)
RDW: 21.9 % — ABNORMAL HIGH (ref 11.5–15.5)
WBC: 1.1 10*3/uL — CL (ref 4.0–10.5)
nRBC: 1.8 % — ABNORMAL HIGH (ref 0.0–0.2)

## 2019-01-14 LAB — COMPREHENSIVE METABOLIC PANEL
ALT: 54 U/L — ABNORMAL HIGH (ref 0–44)
AST: 56 U/L — ABNORMAL HIGH (ref 15–41)
Albumin: 2.9 g/dL — ABNORMAL LOW (ref 3.5–5.0)
Alkaline Phosphatase: 132 U/L — ABNORMAL HIGH (ref 38–126)
Anion gap: 5 (ref 5–15)
BUN: 23 mg/dL (ref 8–23)
CO2: 27 mmol/L (ref 22–32)
Calcium: 13.6 mg/dL (ref 8.9–10.3)
Chloride: 119 mmol/L — ABNORMAL HIGH (ref 98–111)
Creatinine, Ser: 1.16 mg/dL (ref 0.61–1.24)
GFR calc Af Amer: >60 mL/min (ref >60)
GFR calc non Af Amer: >60 mL/min (ref >60)
Glucose, Bld: 105 mg/dL — ABNORMAL HIGH (ref 70–99)
Potassium: 4.1 mmol/L (ref 3.5–5.1)
Sodium: 151 mmol/L — ABNORMAL HIGH (ref 135–145)
Total Bilirubin: 0.8 mg/dL (ref 0.3–1.2)
Total Protein: 7.1 g/dL (ref 6.5–8.1)

## 2019-01-14 LAB — PHOSPHORUS: Phosphorus: 2.3 mg/dL — ABNORMAL LOW (ref 2.5–4.6)

## 2019-01-14 LAB — MAGNESIUM: Magnesium: 1.8 mg/dL (ref 1.7–2.4)

## 2019-01-14 MED ORDER — DEXTROSE 5 % IV SOLN
INTRAVENOUS | Status: DC
  Administered 2019-01-14 – 2019-01-16 (×4): via INTRAVENOUS

## 2019-01-14 MED ORDER — SODIUM CHLORIDE 0.9 % IV BOLUS
500.0000 mL | Freq: Once | INTRAVENOUS | Status: AC
  Administered 2019-01-14: 500 mL via INTRAVENOUS

## 2019-01-14 MED ORDER — CALCITONIN (SALMON) 200 UNIT/ML IJ SOLN
4.0000 [IU]/kg | Freq: Two times a day (BID) | INTRAMUSCULAR | Status: AC
  Administered 2019-01-14 – 2019-01-16 (×4): 206 [IU] via SUBCUTANEOUS
  Filled 2019-01-14 (×4): qty 1.03

## 2019-01-14 NOTE — Progress Notes (Signed)
PROGRESS NOTE    Jesus [name]  WLN:[id number] DOB: [birth year] DOA: 01/11/2019 PCP: Patient, No Pcp Per    Brief Narrative:  Patient is a 63 year old male with history of multiple myeloma, arthritis, cervical stenosis who presented with nosebleed.  He had a similar episode about 2 weeks ago which lasted for 8 hours but spontaneously resolved.  He follows with oncologist at [place] to have severe thrombocytopenia at presentation and also has normocytic anemia.  Hematology/oncology consulted.  Nosebleed has stopped.  Being transfused today for anemia.  Also found to have hypercalcemia secondary to MM and was given a dose of Zometa and Decadron.  Assessment & Plan:   Active Problems:   Hypercalcemia   Epistaxis   Thrombocytopenia (HCC)   Anemia   Leukopenia   Multiple myeloma: Follows with Dr Irene Limbo and also  Dr. Blinda Leatherwood ,,oncology, at [facility].  Has history of recent C3 compression fracture status post corpectomy, C2-C4 plating.  Oncology following, suggest bone marrow biopsy by IR , completed yesterday, follow up results. Bone scan: Uptake over the right acetabulum, mid to lower thoracic spine, upper anterior ribs and possibly cervicothoracic junction likely representing patient's known multiple myeloma  Hypercalcemia: Secondary to malignancy.  Calcium level still elevated..  Continue aggressive  IV fluids.  Given Zometa and Decadron. Nephrology consulted, follow up recommendation.  Pancytopenia: Secondary to malignancy.  Hemoglobin this morning 8.6.  S/p 1 PRBC.  Also transfused with a unit of  platelets.  Transfuse platelets if is less than 20000.  Nosebleed: Secondary to severe thrombocytopenia.  Stopped.  Severe protein calorie malnutrition: Will request for dietician  evaluation   DVT prophylaxis:SCD Code Status: Full Family Communication: Called wife on and gave update Disposition Plan: Home after full work-up, improvement in the  pancytopenia  Consultants:   Oncology  Procedures: Scheduled to have a bone marrow biopsy by IR today.  Antimicrobials: Anti-infectives (From admission, onward)   None      Subjective: Patient was seen and examined at bedside.  He appears comfortable, hemodynamically stable, denies any nosebleed, nurse reported no overnight events.  He appears very slow to respond could be due to high calcium levels.  Objective: Vitals:   01/13/19 2029 01/14/19 0015 01/14/19 0415 01/14/19 0812  BP: (!) 147/86 (!) 143/87 (!) 159/89 135/85  Pulse: 91 98 98 (!) 120  Resp: _0 Temp: 98 F (36.7 C) 98.3 F (36.8 C) 98.2 F (36.8 C) 98 F (36.7 C)  TempSrc: Oral Oral Oral   SpO2: 100% 99% 100% 99%  Weight:   51.3 kg   Height:        Intake/Output Summary (Last 24 hours) at 01/14/2019 1431 Last data filed at 01/14/2019 1040 Gross per 24 hour  Intake 606.25 ml  Output 8900 ml  Net -8293.75 ml   Filed Weights   01/12/19 0500 01/13/19 0447 01/14/19 0415  Weight: 49.1 kg 52.8 kg 51.3 kg    Examination:  General exam: Appears calm and comfortable, thin built,  appears generally weak. Respiratory system: Clear to auscultation. Respiratory effort normal. Cardiovascular system: S1 & S2 heard, RRR. No JVD, murmurs, rubs, gallops or clicks. No pedal edema. Gastrointestinal system: Abdomen is nondistended, soft and nontender. No organomegaly or masses felt. Normal bowel sounds heard. Central nervous system: Alert and oriented. No focal neurological deficits. Extremities: No edema, no clubbing Skin: No rashes, lesions or ulcers Psychiatry: Judgement and insight appear normal. Mood & affect appropriate.  Data Reviewed: I have personally reviewed following labs and imaging studies  CBC: Recent Labs  Lab 01/11/19 1349 01/12/19 0400 01/12/19 0859 01/13/19 0443 01/14/19 0431  WBC 1.9* 1.5* 1.3* 1.0* 1.1*  NEUTROABS 0.8*  --   --  0.5*  --   HGB 8.6* 6.0* 5.6* 8.6* 9.7*   HCT 26.6* 18.9* 17.6* 25.8* 30.1*  MCV 94.7 97.4 97.8 88.1 89.6  PLT 9* 37* 29* 22* 18*   Basic Metabolic Panel: Recent Labs  Lab 01/11/19 1349 01/12/19 0400 01/13/19 0443 01/14/19 0431  NA 137 139 139 151*  K 3.4* 3.7 3.0* 4.1  CL 97* 103 107 119*  CO2 32 _0 GLUCOSE 117* 154* 103* 105*  BUN 26* 23 25* 23  CREATININE 1.14 0.97 1.01 1.16  CALCIUM >15.0* 13.7* 12.1* 13.6*  MG  --   --   --  1.8  PHOS  --   --   --  2.3*   GFR: Estimated Creatinine Clearance: 47.3 mL/min (by C-G formula based on SCr of 1.16 mg/dL). Liver Function Tests: Recent Labs  Lab 01/11/19 1349 01/13/19 0443 01/14/19 0431  AST 33 25 56*  ALT 20 16 54*  ALKPHOS 74 59 132*  BILITOT 0.5 0.5 0.8  PROT 7.7 5.9* 7.1  ALBUMIN 3.4* 2.6* 2.9*   No results for input(s): LIPASE, AMYLASE in the last 168 hours. No results for input(s): AMMONIA in the last 168 hours. Coagulation Profile: Recent Labs  Lab 01/11/19 1349 01/13/19 1608  INR 1.0 1.0   Cardiac Enzymes: No results for input(s): CKTOTAL, CKMB, CKMBINDEX, TROPONINI in the last 168 hours. BNP (last 3 results) No results for input(s): PROBNP in the last 8760 hours. HbA1C: No results for input(s): HGBA1C in the last 72 hours. CBG: Recent Labs  Lab 01/12/19 0953  GLUCAP 124*   Lipid Profile: No results for input(s): CHOL, HDL, LDLCALC, TRIG, CHOLHDL, LDLDIRECT in the last 72 hours. Thyroid Function Tests: No results for input(s): TSH, T4TOTAL, FREET4, T3FREE, THYROIDAB in the last 72 hours. Anemia Panel: No results for input(s): VITAMINB12, FOLATE, FERRITIN, TIBC, IRON, RETICCTPCT in the last 72 hours. Sepsis Labs: No results for input(s): PROCALCITON, LATICACIDVEN in the last 168 hours.  Recent Results (from the past 240 hour(s))  SARS CORONAVIRUS 2 (TAT 6-24 HRS) Nasopharyngeal Nasopharyngeal Swab     Status: None   Collection Time: 01/11/19  3:32 PM   Specimen: Nasopharyngeal Swab  Result Value Ref Range Status   SARS  Coronavirus 2 NEGATIVE NEGATIVE Final    Comment: (NOTE) SARS-CoV-2 target nucleic acids are NOT DETECTED. The SARS-CoV-2 RNA is generally detectable in upper and lower respiratory specimens during the acute phase of infection. Negative results do not preclude SARS-CoV-2 infection, do not rule out co-infections with other pathogens, and should not be used as the sole basis for treatment or other patient management decisions. Negative results must be combined with clinical observations, patient history, and epidemiological information. The expected result is Negative. Fact Sheet for Patients: SugarRoll.be Fact Sheet for Healthcare Providers: https://www.woods-mathews.com/ This test is not yet approved or cleared by the [place] FDA and  has been authorized for detection and/or diagnosis of SARS-CoV-2 by FDA under an Emergency Use Authorization (EUA). This EUA will remain  in effect (meaning this test can be used) for the duration of the COVID-19 declaration under Section 56 4(b)(1) of the Act, 21 U.S.C. section 360bbb-3(b)(1), unless the authorization is terminated or revoked sooner. Performed at [facility], [address] [place], [place] [address]    Radiology Studies: No results found. Scheduled Meds: . sodium chloride   Intravenous Once  . sodium chloride   Intravenous Once  . Chlorhexidine Gluconate Cloth  6 each Topical Daily  . docusate sodium  100 mg Oral BID  . dronabinol  10 mg Oral BID  . feeding supplement  1 Container Oral TID BM  . feeding supplement (PRO-STAT SUGAR FREE 64)  30 mL Oral BID  . gabapentin  300 mg Oral TID  . morphine  30 mg Oral Q12H  . oxymetazoline  1 spray Each Nare BID  . senna-docusate  1 tablet Oral Daily  . sucralfate  1 g Oral TID AC & HS  . tranexamic acid  500 mg Topical Once   Continuous Infusions: . sodium chloride 125 mL/hr at 01/14/19 1032     LOS: 2 days    Time spent:     Shawna Clamp, MD [facility]   If 7PM-7AM, please contact night-coverage

## 2019-01-14 NOTE — Consult Note (Signed)
Renal Service Consult Note [facility]  Jesus [name] 01/14/2019 [place] Requesting Physician:  Dr Dwyane Dee  Reason for Consult:  Hypercalcemia, myeloma HPI: The patient is a 63 y.o. year-old presented on 12/09 w/ epistaxis, hx of myeloma.  In ED labs show platelet count of 9K, wbc 1k.  He rec'd 1u plts and was admitted. ONC consulted.  Calcium was 12-13 range on admission. Started on IVF's and given IV dexamethasone, Ca supplement was stopped. Today Ca levels have not improved, asked to see for hypercalcemia.   In Care everywhere, Ca++ was 9.2 in July 2020 and 10.8 on Dec 12, 2018. Creat baseline is 0.4- 0.6.  Myeloma panels back in March showed M spike IgG lambda LC of 0.2 in March, 0.4- 0.5 in May and 0.4 in June.  Repeat panel is pending here.   Pt seen in room, wife is at bedside.  She states pt dx'd w/ myeloma in 2018 and started chemoRx at [facility] in [place], he then also did his maint chemoRx in [place], per the wife travelling back and forth frequently.  In march w/ the pandemic he no longer went to [place] and started seeing [facility] in [place]. NO hx renal failure per the wife.  Pt is confused and doesn't provide history.     ROS n/a    Past Medical History  Past Medical History:  Diagnosis Date  . Arthritis   . Cancer ([place])    multiple myeloma  . Cervical stenosis of spinal canal   . Wears glasses    Past Surgical History  Past Surgical History:  Procedure Laterality Date  . ANTERIOR CERVICAL CORPECTOMY N/A 11/04/2018   Procedure: Cervical three Corpectomy with Cervical two to Cervical four Plating;  Surgeon: Erline Levine, MD;  Location: [place];  Service: Neurosurgery;  Laterality: N/A;  . APPENDECTOMY    . BACK SURGERY    . EYE SURGERY Bilateral    cataract surgery with lens implants  . HERNIA REPAIR Left    inguinal   . IR IMAGING GUIDED PORT INSERTION  06/21/2018  . ROTATOR CUFF REPAIR Left    Family History  Family History  Problem Relation Age of  Onset  . Breast cancer Sister    Social History  reports that he has never smoked. He has never used smokeless tobacco. He reports that he does not drink alcohol or use drugs. Allergies  Allergies  Allergen Reactions  . Penicillins Tinitus    Did it involve swelling of the face/tongue/throat, SOB, or low BP? No Did it involve sudden or severe rash/hives, skin peeling, or any reaction on the inside of your mouth or nose? No Did you need to seek medical attention at a hospital or doctor's office? Yes When did it last happen? More than 30 years ago If all above answers are "NO", may proceed with cephalosporin use.    Home medications Prior to Admission medications   Medication Sig Start Date End Date Taking? Authorizing Provider  acetaminophen (TYLENOL) 325 MG tablet Take 325-650 mg by mouth every 6 (six) hours as needed (pain.).  09/07/16  Yes [provider]  acyclovir (ZOVIRAX) 400 MG tablet Take 1 tablet (400 mg total) by mouth 2 (two) times daily. 10/21/18  Yes Brunetta Genera, MD  Calcium Carb-Cholecalciferol (CALCIUM 600+D3) 600-800 MG-UNIT TABS Take 1 tablet by mouth daily.   Yes [provider]  dexamethasone (DECADRON) 4 MG tablet Take 5 tablets (43m) with breakfast the day after each  dose of Daratumumab 06/15/18  Yes Brunetta Genera, MD  dronabinol (MARINOL) 10 MG capsule TAKE 1 CAPSULE BY MOUTH TWICE DAILY BEFORE MEAL(S) Patient taking differently: Take 10 mg by mouth 2 (two) times daily before a meal.  11/28/18  Yes Brunetta Genera, MD  ergocalciferol (VITAMIN D2) 1.25 MG (50000 UT) capsule Take 1 capsule (50,000 Units total) by mouth 2 (two) times a week. 09/29/18  Yes Brunetta Genera, MD  morphine (MS CONTIN) 30 MG 12 hr tablet Take 1 tablet (30 mg total) by mouth every 12 (twelve) hours. 11/29/18  Yes Hayden Pedro, PA-C  ondansetron (ZOFRAN) 4 MG tablet Take 1 tablet (4 mg total) by mouth every 8 (eight) hours as needed for nausea or  vomiting. 04/20/18  Yes Brunetta Genera, MD  oxyCODONE (ROXICODONE) 15 MG immediate release tablet Take 1 tablet (15 mg total) by mouth every 4 (four) hours as needed for severe pain. 11/29/18  Yes Hayden Pedro, PA-C  senna-docusate (SENOKOT-S) 8.6-50 MG tablet Take 1 tablet by mouth daily.  08/31/16  Yes [provider]  gabapentin (NEURONTIN) 300 MG capsule Take 1 capsule (300 mg total) by mouth 3 (three) times daily. Patient not taking: Reported on 01/11/2019 11/07/18   Erline Levine, MD  methocarbamol (ROBAXIN) 500 MG tablet Take 1 tablet (500 mg total) by mouth every 6 (six) hours as needed for muscle spasms. Patient not taking: Reported on 01/11/2019 11/07/18   Erline Levine, MD  sucralfate (CARAFATE) 1 g tablet Take 1 tablet (1 g total) by mouth 4 (four) times daily. Patient not taking: Reported on 01/11/2019 07/22/18   Kyung Rudd, MD   Liver Function Tests Recent Labs  Lab 01/11/19 1349 01/13/19 0443 01/14/19 0431  AST 33 25 56*  ALT 20 16 54*  ALKPHOS 74 59 132*  BILITOT 0.5 0.5 0.8  PROT 7.7 5.9* 7.1  ALBUMIN 3.4* 2.6* 2.9*   No results for input(s): LIPASE, AMYLASE in the last 168 hours. CBC Recent Labs  Lab 01/11/19 1349 01/12/19 0859 01/13/19 0443 01/14/19 0431  WBC 1.9* 1.3* 1.0* 1.1*  NEUTROABS 0.8*  --  0.5*  --   HGB 8.6* 5.6* 8.6* 9.7*  HCT 26.6* 17.6* 25.8* 30.1*  MCV 94.7 97.8 88.1 89.6  PLT 9* 29* 22* 18*   Basic Metabolic Panel Recent Labs  Lab 01/11/19 1349 01/12/19 0400 01/13/19 0443 01/14/19 0431  NA 137 139 139 151*  K 3.4* 3.7 3.0* 4.1  CL 97* 103 107 119*  CO2 32 '30 27 27  ' GLUCOSE 117* 154* 103* 105*  BUN 26* 23 25* 23  CREATININE 1.14 0.97 1.01 1.16  CALCIUM >15.0* 13.7* 12.1* 13.6*  PHOS  --   --   --  2.3*   Iron/TIBC/Ferritin/ %Sat No results found for: IRON, TIBC, FERRITIN, IRONPCTSAT  Vitals:   01/14/19 0015 01/14/19 0415 01/14/19 0812 01/14/19 1453  BP: (!) 143/87 (!) 159/89 135/85 (!) 164/88  Pulse: 98  98 (!) 120 (!) 102  Resp: '14 16 16 15  ' Temp: 98.3 F (36.8 C) 98.2 F (36.8 C) 98 F (36.7 C) 98.7 F (37.1 C)  TempSrc: Oral Oral    SpO2: 99% 100% 99% 98%  Weight:  51.3 kg    Height:        Exam Gen frail a bit cachectic, confused and lethargic No rash, cyanosis or gangrene Sclera anicteric, throat clear and dry  No jvd or bruits Chest clear bilat to bases . No rales or wheezing  RRR no RG, soft sem  Abd soft ntnd no mass or ascites +bs GU normal male, condom cath MS no joint effusions or deformity Ext no LE or UE edema, no wounds or ulcers Neuro is as above     Na 151  K 4.1  CO2 27  BUN 23  Cr 1.16  Phos 2.3  Alb 2.9  TP 7.1   WBC 1.1  Hb 9.7  plt 18k   UA not done   Assessment/ Plan: 1. Hypercalcemia - due to myeloma bony infiltration most likely.  Pancytopenia supports this possibility as well.  Will slightly increase saline (goal 200- 250 / hr but hypernatremia should be corrected first) and start calcitonin for acute lowering of Ca++ levels. Pt still sig symptomatic.  He rec'd already 67m of zoledronic acid which usually takes 2-4 days to work.  Pt is getting IV decadron for myeloma Rx at this time per Dr KIrene Limbo/Adella Nissen  2. Hypernatremia - needs D5W, will start at 125/ hr 3. AMS - prob due to ^[place] should resolve w/ correction 4. Mult myeloma - per ONC 5. AKI - mild ^creat but this is 2 x his normal creat of 0.5, due to hyperCa++ +/- myeloma kidney (less likely) 6. SP recent Cspine surgery      RKelly Splinter MD 01/14/2019, 5:20 PM

## 2019-01-14 NOTE — Progress Notes (Signed)
CRITICAL VALUE STICKER  CRITICAL VALUE: Ca 13.6  RECEIVER (on-site recipient of call): Debbie  DATE & TIME NOTIFIED:   01/14/19; 0530  MD NOTIFIED:  Lennox Grumbles  TIME OF NOTIFICATION: 0533  RESPONSE: Awaiting    On call also aware pt alerted & unable to swallow pills overnight. Speech consulted. See previous note.

## 2019-01-14 NOTE — Progress Notes (Signed)
Upon assessment, Patient really weak and unable to hold cup to take medication. Patient with delayed responses. Oriented to self. Dozing off during conversation. Slight tremors noted. Attempted to administer medications. Patient coughing and choking on water. Unable to get all medications down only some. Patient stable. Patient may benefit from speech consult. Will continue to monitor.

## 2019-01-14 NOTE — Evaluation (Signed)
Clinical/Bedside Swallow Evaluation Patient Details  Name: Jesus [name] MRN: [id number] Date of Birth: 02/09/1955  Today's Date: 01/14/2019 Time: SLP Start Time (ACUTE ONLY): 0830 SLP Stop Time (ACUTE ONLY): 0845 SLP Time Calculation (min) (ACUTE ONLY): 15 min  Past Medical History:  Past Medical History:  Diagnosis Date  . Arthritis   . Cancer ([place])    multiple myeloma  . Cervical stenosis of spinal canal   . Wears glasses    Past Surgical History:  Past Surgical History:  Procedure Laterality Date  . ANTERIOR CERVICAL CORPECTOMY N/A 11/04/2018   Procedure: Cervical three Corpectomy with Cervical two to Cervical four Plating;  Surgeon: Erline Levine, MD;  Location: [facility];  Service: Neurosurgery;  Laterality: N/A;  . APPENDECTOMY    . BACK SURGERY    . EYE SURGERY Bilateral    cataract surgery with lens implants  . HERNIA REPAIR Left    inguinal   . IR IMAGING GUIDED PORT INSERTION  06/21/2018  . ROTATOR CUFF REPAIR Left    HPI:  Patient is a 63 y.o. male with PMH: multiple myeloma, arthritis, cervical stenosis, who presented to hospital with nosebleed which was not able to be controlled at home despite attempts with pressure, paper towels and ice. He had similar episodes two weeks prior where bleeding lasted 8 hours before it finally stopped. Patient has had poor appetite for past couple weeks. Wife reported he seemed more confused in AM of admission. on 12/11, RN reported patient with significant difficulty with swallowing and unable to take medications and so SLP BSE ordered.   Assessment / Plan / Recommendation Clinical Impression  Patient presents with a mod-severe oropharyngeal dysphagia with impact from current level of alertness. His eyes remained open, but verbal responses were very limited and delayed; when following commands he was very delayed and inconsistently perfoming. Patient exhibited significant oral delay with small ice chip as well as multiple swallows and  discoordinated hyolaryngeal movement per palpation. Swallow initiation delays and discoordination of swallow occurerd with spoon sip and small cup sip of water. Patient was unable to suck from straw. When SLP attempted to have patient hold cup, he would start to lift hands up, then suddently drop them. This occured when taking cup sip and attempted straw sip as well; he would start to actively attempt then suddently stop.Patient is at high aspiration risk and risk for dehydration and malnutrion at this time and recommendation is for NPO. SLP Visit Diagnosis: Dysphagia, unspecified (R13.10)    Aspiration Risk  Severe aspiration risk;Risk for inadequate nutrition/hydration    Diet Recommendation NPO   Medication Administration: Via alternative means    Other  Recommendations Oral Care Recommendations: Oral care QID   Follow up Recommendations Skilled Nursing facility      Frequency and Duration min 2x/week  1 week       Prognosis Prognosis for Safe Diet Advancement: Good      Swallow Study   General Date of Onset: 01/14/19 HPI: Patient is a 63 y.o. male with PMH: multiple myeloma, arthritis, cervical stenosis, who presented to hospital with nosebleed which was not able to be controlled at home despite attempts with pressure, paper towels and ice. He had similar episodes two weeks prior where bleeding lasted 8 hours before it finally stopped. Patient has had poor appetite for past couple weeks. Wife reported he seemed more confused in AM of admission. on 12/11, RN reported patient with significant difficulty with swallowing and unable to take medications and  so SLP BSE ordered. Type of Study: Bedside Swallow Evaluation Previous Swallow Assessment: N/A Diet Prior to this Study: Regular;Thin liquids Temperature Spikes Noted: No Respiratory Status: Nasal cannula History of Recent Intubation: No Behavior/Cognition: Alert;Requires cueing;Doesn't follow directions Oral Cavity Assessment:  Within Functional Limits Oral Care Completed by SLP: Yes Oral Cavity - Dentition: Adequate natural dentition Self-Feeding Abilities: Total assist Patient Positioning: Upright in bed;Postural control adequate for testing Baseline Vocal Quality: Hoarse;Low vocal intensity Volitional Cough: Cognitively unable to elicit Volitional Swallow: Unable to elicit    Oral/Motor/Sensory Function Overall Oral Motor/Sensory Function: Moderate impairment Facial ROM: Reduced right;Reduced left Facial Symmetry: Within Functional Limits Facial Strength: Reduced right;Reduced left Lingual ROM: Reduced right;Reduced left Lingual Symmetry: Within Functional Limits Lingual Strength: Reduced   Ice Chips Ice chips: Impaired Presentation: Spoon Oral Phase Impairments: Reduced lingual movement/coordination;Poor awareness of bolus;Impaired mastication Oral Phase Functional Implications: Prolonged oral transit Pharyngeal Phase Impairments: Suspected delayed Swallow;Decreased hyoid-laryngeal movement;Multiple swallows Other Comments: discoordinated swallow   Thin Liquid Thin Liquid: Impaired Presentation: Spoon;Cup;Straw Oral Phase Impairments: Poor awareness of bolus;Reduced lingual movement/coordination Oral Phase Functional Implications: Prolonged oral transit Pharyngeal  Phase Impairments: Suspected delayed Swallow;Multiple swallows Other Comments: unable to suck with straw; cup sip and spoon sip resulted in discoordinated swallow    Nectar Thick Nectar Thick Liquid: Not tested   Honey Thick Honey Thick Liquid: Not tested   Puree Puree: Not tested   Solid     Solid: Not tested      Sonia Baller, MA, CCC-SLP 01/14/19 9:39 AM

## 2019-01-15 LAB — COMPREHENSIVE METABOLIC PANEL
ALT: 40 U/L (ref 0–44)
AST: 36 U/L (ref 15–41)
Albumin: 2.9 g/dL — ABNORMAL LOW (ref 3.5–5.0)
Alkaline Phosphatase: 123 U/L (ref 38–126)
Anion gap: 7 (ref 5–15)
BUN: 23 mg/dL (ref 8–23)
CO2: 26 mmol/L (ref 22–32)
Calcium: 12.9 mg/dL — ABNORMAL HIGH (ref 8.9–10.3)
Chloride: 119 mmol/L — ABNORMAL HIGH (ref 98–111)
Creatinine, Ser: 1.05 mg/dL (ref 0.61–1.24)
GFR calc Af Amer: >60 mL/min (ref >60)
GFR calc non Af Amer: >60 mL/min (ref >60)
Glucose, Bld: 163 mg/dL — ABNORMAL HIGH (ref 70–99)
Potassium: 3.1 mmol/L — ABNORMAL LOW (ref 3.5–5.1)
Sodium: 152 mmol/L — ABNORMAL HIGH (ref 135–145)
Total Bilirubin: 0.5 mg/dL (ref 0.3–1.2)
Total Protein: 7.2 g/dL (ref 6.5–8.1)

## 2019-01-15 LAB — RENAL FUNCTION PANEL
Albumin: 2.7 g/dL — ABNORMAL LOW (ref 3.5–5.0)
Anion gap: 7 (ref 5–15)
BUN: 22 mg/dL (ref 8–23)
CO2: 27 mmol/L (ref 22–32)
Calcium: 13 mg/dL — ABNORMAL HIGH (ref 8.9–10.3)
Chloride: 119 mmol/L — ABNORMAL HIGH (ref 98–111)
Creatinine, Ser: 1.06 mg/dL (ref 0.61–1.24)
GFR calc Af Amer: >60 mL/min (ref >60)
GFR calc non Af Amer: >60 mL/min (ref >60)
Glucose, Bld: 165 mg/dL — ABNORMAL HIGH (ref 70–99)
Phosphorus: 2.1 mg/dL — ABNORMAL LOW (ref 2.5–4.6)
Potassium: 3.1 mmol/L — ABNORMAL LOW (ref 3.5–5.1)
Sodium: 153 mmol/L — ABNORMAL HIGH (ref 135–145)

## 2019-01-15 LAB — CBC
HCT: 29.3 % — ABNORMAL LOW (ref 39.0–52.0)
Hemoglobin: 9.2 g/dL — ABNORMAL LOW (ref 13.0–17.0)
MCH: 28.3 pg (ref 26.0–34.0)
MCHC: 31.4 g/dL (ref 30.0–36.0)
MCV: 90.2 fL (ref 80.0–100.0)
Platelets: 14 10*3/uL — CL (ref 150–400)
RBC: 3.25 MIL/uL — ABNORMAL LOW (ref 4.22–5.81)
RDW: 21.5 % — ABNORMAL HIGH (ref 11.5–15.5)
WBC: 1.5 10*3/uL — ABNORMAL LOW (ref 4.0–10.5)
nRBC: 0 % (ref 0.0–0.2)

## 2019-01-15 LAB — URINALYSIS, ROUTINE W REFLEX MICROSCOPIC
Bacteria, UA: NONE SEEN
Bilirubin Urine: NEGATIVE
Glucose, UA: NEGATIVE mg/dL
Ketones, ur: NEGATIVE mg/dL
Leukocytes,Ua: NEGATIVE
Nitrite: NEGATIVE
Protein, ur: NEGATIVE mg/dL
Specific Gravity, Urine: 1.008 (ref 1.005–1.030)
pH: 7 (ref 5.0–8.0)

## 2019-01-15 LAB — POTASSIUM: Potassium: 3 mmol/L — ABNORMAL LOW (ref 3.5–5.1)

## 2019-01-15 LAB — MAGNESIUM: Magnesium: 1.7 mg/dL (ref 1.7–2.4)

## 2019-01-15 MED ORDER — POTASSIUM CHLORIDE 10 MEQ/100ML IV SOLN
10.0000 meq | INTRAVENOUS | Status: AC
  Administered 2019-01-15 – 2019-01-16 (×3): 10 meq via INTRAVENOUS
  Filled 2019-01-15: qty 100

## 2019-01-15 MED ORDER — MORPHINE SULFATE (PF) 2 MG/ML IV SOLN
INTRAVENOUS | Status: AC
  Administered 2019-01-15: 15:00:00 2 mg via INTRAVENOUS
  Filled 2019-01-15: qty 1

## 2019-01-15 MED ORDER — BISACODYL 10 MG RE SUPP
10.0000 mg | Freq: Every day | RECTAL | Status: DC | PRN
  Administered 2019-01-15: 10 mg via RECTAL
  Filled 2019-01-15: qty 1

## 2019-01-15 MED ORDER — K PHOS MONO-SOD PHOS DI & MONO 155-852-130 MG PO TABS
250.0000 mg | ORAL_TABLET | Freq: Three times a day (TID) | ORAL | Status: AC
  Administered 2019-01-16: 250 mg via ORAL
  Filled 2019-01-15 (×4): qty 1

## 2019-01-15 MED ORDER — MORPHINE SULFATE (PF) 2 MG/ML IV SOLN: 1.0000 mg | Freq: Four times a day (QID) | INTRAVENOUS | Status: DC | PRN

## 2019-01-15 MED ORDER — POTASSIUM CHLORIDE 10 MEQ/100ML IV SOLN
10.0000 meq | INTRAVENOUS | Status: AC
  Administered 2019-01-15 (×3): 10 meq via INTRAVENOUS
  Filled 2019-01-15 (×3): qty 100

## 2019-01-15 NOTE — Progress Notes (Signed)
PROGRESS NOTE    DENZAL MEIR  TIR:[id number] DOB: Mar 10, 1955 DOA: 01/11/2019 PCP: Patient, No Pcp Per    Brief Narrative:  Patient is a 63 year old male with history of multiple myeloma, arthritis, cervical stenosis who presented with nosebleed.  He had a similar episode about 2 weeks ago which lasted for 8 hours but spontaneously resolved.  He follows with oncologist at [facility] to have severe thrombocytopenia at presentation and also has normocytic anemia.  Hematology/oncology consulted.  Nosebleed has stopped.  Being transfused today for anemia.  Also found to have hypercalcemia secondary to MM and was given a dose of Zometa and Decadron.  Assessment & Plan:   Active Problems:   Hypercalcemia   Epistaxis   Thrombocytopenia (HCC)   Anemia   Leukopenia   # Multiple myeloma: Follows with Dr Irene Limbo and also Dr. Blinda Leatherwood ,,oncology, at [facility].  Has history of recent C3 compression fracture status post corpectomy, C2-C4 plating.  Oncology following, suggest bone marrow biopsy by IR , completed 12/11, follow up results. Bone scan: Uptake over the right acetabulum, mid to lower thoracic spine, upper anterior ribs and possibly cervicothoracic junction likely representing patient's known multiple myeloma.  Hypercalcemia: Secondary to malignancy.  Calcium level still elevated..  Continue aggressive  IV fluids.  Given Zometa and Decadron. Nephrology consulted, suggest : Increase saline (goal 200 to 250 cc/hr),  Calcitonin.  Hypernatremia: D5W @ 125 cc/hr, recheck labs  Pancytopenia: Secondary to malignancy.  Hemoglobin this morning 8.6.  S/p 1 PRBC.  Also transfused with a unit of  platelets.  Transfuse platelets if is less than 20000 and if bleeding.  Nosebleed: Secondary to severe thrombocytopenia.  Stopped.  Severe protein calorie malnutrition: Will request for dietician  evaluation   DVT prophylaxis:SCD Code Status: Full Family Communication: Called  wife on and gave update Disposition Plan: Home after full work-up, improvement in the pancytopenia  Consultants:   Oncology  Procedures:  bone marrow biopsy by IR   Antimicrobials: Anti-infectives (From admission, onward)   None      Subjective: Patient was seen and examined at bedside.  He appears comfortable, hemodynamically stable, denies any nosebleed, nurse reported no overnight events.  He appears very slow to respond could be due to high calcium levels.  Objective: Vitals:   01/14/19 1453 01/14/19 2020 01/15/19 0524 01/15/19 1453  BP: (!) 164/88 (!) 143/92 (!) 137/94 (!) 148/87  Pulse: (!) 102 94 95 (!) 101  Resp: '15 16 18 20  '$ Temp: 98.7 F (37.1 C) 97.6 F (36.4 C) 98.1 F (36.7 C) 98.2 F (36.8 C)  TempSrc:  Oral Axillary   SpO2: 98% 99% 100% 100%  Weight:   49.7 kg   Height:        Intake/Output Summary (Last 24 hours) at 01/15/2019 1712 Last data filed at 01/15/2019 1400 Gross per 24 hour  Intake 4637.73 ml  Output 1450 ml  Net 3187.73 ml   Filed Weights   01/13/19 0447 01/14/19 0415 01/15/19 0524  Weight: 52.8 kg 51.3 kg 49.7 kg    Examination:  General exam: Appears calm and comfortable, thin built,  appears generally weak. Respiratory system: Clear to auscultation. Respiratory effort normal. Cardiovascular system: S1 & S2 heard, RRR. No JVD, murmurs, rubs, gallops or clicks. No pedal edema. Gastrointestinal system: Abdomen is nondistended, soft and nontender. No organomegaly or masses felt. Normal bowel sounds heard. Central nervous system: Alert and oriented. No focal neurological deficits. Extremities: No edema, no clubbing Skin:  No rashes, lesions or ulcers Psychiatry: Judgement and insight appear normal. Mood & affect appropriate.     Data Reviewed: I have personally reviewed following labs and imaging studies  CBC: Recent Labs  Lab 01/11/19 1349 01/12/19 0400 01/12/19 0859 01/13/19 0443 01/14/19 0431 01/15/19 0442  WBC 1.9*  1.5* 1.3* 1.0* 1.1* 1.5*  NEUTROABS 0.8*  --   --  0.5*  --   --   HGB 8.6* 6.0* 5.6* 8.6* 9.7* 9.2*  HCT 26.6* 18.9* 17.6* 25.8* 30.1* 29.3*  MCV 94.7 97.4 97.8 88.1 89.6 90.2  PLT 9* 37* 29* 22* 18* 14*   Basic Metabolic Panel: Recent Labs  Lab 01/11/19 1349 01/12/19 0400 01/13/19 0443 01/14/19 0431 01/15/19 0442  NA 137 139 139 151* 152*  153*  K 3.4* 3.7 3.0* 4.1 3.1*  3.1*  CL 97* 103 107 119* 119*  119*  CO2 32 _0 GLUCOSE 117* 154* 103* 105* 163*  165*  BUN 26* 23 25* _1 CREATININE 1.14 0.97 1.01 1.16 1.05  1.06  CALCIUM >15.0* 13.7* 12.1* 13.6* 12.9*  13.0*  MG  --   --   --  1.8 1.7  PHOS  --   --   --  2.3* 2.1*   GFR: Estimated Creatinine Clearance: 50.6 mL/min (by C-G formula based on SCr of 1.05 mg/dL). Liver Function Tests: Recent Labs  Lab 01/11/19 1349 01/13/19 0443 01/14/19 0431 01/15/19 0442  AST 33 25 56* 36  ALT 20 16 54* 40  ALKPHOS 74 59 132* 123  BILITOT 0.5 0.5 0.8 0.5  PROT 7.7 5.9* 7.1 7.2  ALBUMIN 3.4* 2.6* 2.9* 2.9*  2.7*   No results for input(s): LIPASE, AMYLASE in the last 168 hours. No results for input(s): AMMONIA in the last 168 hours. Coagulation Profile: Recent Labs  Lab 01/11/19 1349 01/13/19 1608  INR 1.0 1.0   Cardiac Enzymes: No results for input(s): CKTOTAL, CKMB, CKMBINDEX, TROPONINI in the last 168 hours. BNP (last 3 results) No results for input(s): PROBNP in the last 8760 hours. HbA1C: No results for input(s): HGBA1C in the last 72 hours. CBG: Recent Labs  Lab 01/12/19 0953  GLUCAP 124*   Lipid Profile: No results for input(s): CHOL, HDL, LDLCALC, TRIG, CHOLHDL, LDLDIRECT in the last 72 hours. Thyroid Function Tests: No results for input(s): TSH, T4TOTAL, FREET4, T3FREE, THYROIDAB in the last 72 hours. Anemia Panel: No results for input(s): VITAMINB12, FOLATE, FERRITIN, TIBC, IRON, RETICCTPCT in the last 72 hours. Sepsis Labs: No results for input(s): PROCALCITON,  LATICACIDVEN in the last 168 hours.  Recent Results (from the past 240 hour(s))  SARS CORONAVIRUS 2 (TAT 6-24 HRS) Nasopharyngeal Nasopharyngeal Swab     Status: None   Collection Time: 01/11/19  3:32 PM   Specimen: Nasopharyngeal Swab  Result Value Ref Range Status   SARS Coronavirus 2 NEGATIVE NEGATIVE Final    Comment: (NOTE) SARS-CoV-2 target nucleic acids are NOT DETECTED. The SARS-CoV-2 RNA is generally detectable in upper and lower respiratory specimens during the acute phase of infection. Negative results do not preclude SARS-CoV-2 infection, do not rule out co-infections with other pathogens, and should not be used as the sole basis for treatment or other patient management decisions. Negative results must be combined with clinical observations, patient history, and epidemiological information. The expected result is Negative. Fact Sheet for Patients: SugarRoll.be Fact Sheet for Healthcare Providers: https://www.woods-mathews.com/ This test is not yet approved or cleared by the [place]  States FDA and  has been authorized for detection and/or diagnosis of SARS-CoV-2 by FDA under an Emergency Use Authorization (EUA). This EUA will remain  in effect (meaning this test can be used) for the duration of the COVID-19 declaration under Section 56 4(b)(1) of the Act, 21 U.S.C. section 360bbb-3(b)(1), unless the authorization is terminated or revoked sooner. Performed at [facility], [place] [address] [place], [place] [address]    Radiology Studies: No results found. Scheduled Meds: . sodium chloride   Intravenous Once  . sodium chloride   Intravenous Once  . calcitonin  4 Units/kg Subcutaneous BID  . Chlorhexidine Gluconate Cloth  6 each Topical Daily  . docusate sodium  100 mg Oral BID  . dronabinol  10 mg Oral BID  . feeding supplement  1 Container Oral TID BM  . feeding supplement (PRO-STAT SUGAR FREE 64)  30 mL Oral BID  .  gabapentin  300 mg Oral TID  . morphine  30 mg Oral Q12H  . oxymetazoline  1 spray Each Nare BID  . senna-docusate  1 tablet Oral Daily  . sucralfate  1 g Oral TID AC & HS  . tranexamic acid  500 mg Topical Once   Continuous Infusions: . sodium chloride 50 mL/hr at 01/15/19 0833  . dextrose 150 mL/hr at 01/15/19 1417     LOS: 3 days    Time spent:    Shawna Clamp, MD [facility]   If 7PM-7AM, please contact night-coverage

## 2019-01-15 NOTE — Progress Notes (Signed)
[facility] Progress Note  Subjective: Ca+= not much better early this am, but this evening pt is more alert and interactive per the wife.  Repeat labs are pending  Vitals:   01/14/19 1453 01/14/19 2020 01/15/19 0524 01/15/19 1453  BP: (!) 164/88 (!) 143/92 (!) 137/94 (!) 148/87  Pulse: (!) 102 94 95 (!) 101  Resp: 15 16 18 20   Temp: 98.7 F (37.1 C) 97.6 F (36.4 C) 98.1 F (36.7 C) 98.2 F (36.8 C)  TempSrc:  Oral Axillary   SpO2: 98% 99% 100% 100%  Weight:   49.7 kg   Height:        Exam: Gen frail , more interactive and not as sleepy, hoarse whisper voice No rash, cyanosis or gangrene Sclera anicteric, throat clear  No jvd or bruits Chest clear bilat to bases . No rales or wheezing RRR no RG, soft sem  Abd soft ntnd no mass or ascites +bs GU normal male, condom cath MS no joint effusions or deformity Ext no LE or UE edema, no wounds or ulcers Neuro is as above     UA - clear 6-10 rbc, 0-5 wbc, neg protein  Assessment/ Plan: 1. Hypercalcemia - due to myeloma bony infiltration most likely.  Pancytopenia supports this possibility as well - cont NS 0.9% and will ^ to 100 cc/hr - cont calcitonin for another 24 hrs approx - pt's MS appears to be improving - has already 4mg  of zoledronic acid - treatment of cancer , pt getting IV decadron per oncology  2. Hypernatremia - cont D5W at 150 cc/hr 3. AMS - prob due to Dola, should resolve w/ correction. Would consider head CT as well.  4. Mult myeloma - onset in 2018, per ONC 5. AKI - mild ^creat 1.2 but this is 2 x his normal creat of 0.6, down today 1.0 6. SP recent Cspine surgery 7. Pancytopenia -per ONC      Rob Romain Erion 01/15/2019, 6:59 PM  Inpatient medications: . sodium chloride   Intravenous Once  . sodium chloride   Intravenous Once  . calcitonin  4 Units/kg Subcutaneous BID  . Chlorhexidine Gluconate Cloth  6 each Topical Daily  . docusate sodium  100 mg Oral BID  . dronabinol  10  mg Oral BID  . feeding supplement  1 Container Oral TID BM  . feeding supplement (PRO-STAT SUGAR FREE 64)  30 mL Oral BID  . gabapentin  300 mg Oral TID  . morphine  30 mg Oral Q12H  . oxymetazoline  1 spray Each Nare BID  . phosphorus  250 mg Oral TID  . senna-docusate  1 tablet Oral Daily  . sucralfate  1 g Oral TID AC & HS  . tranexamic acid  500 mg Topical Once   . sodium chloride 50 mL/hr at 01/15/19 0833  . dextrose 150 mL/hr at 01/15/19 1417   acetaminophen, bisacodyl, methocarbamol, morphine injection, ondansetron, oxyCODONE, senna-docusate, sodium chloride flush

## 2019-01-16 ENCOUNTER — Inpatient Hospital Stay (HOSPITAL_COMMUNITY): Payer: Medicare Other

## 2019-01-16 ENCOUNTER — Other Ambulatory Visit: Payer: Self-pay

## 2019-01-16 LAB — CBC WITH DIFFERENTIAL/PLATELET
Abs Immature Granulocytes: 0.01 10*3/uL (ref 0.00–0.07)
Basophils Absolute: 0 10*3/uL (ref 0.0–0.1)
Basophils Relative: 0 %
Eosinophils Absolute: 0 10*3/uL (ref 0.0–0.5)
Eosinophils Relative: 1 %
HCT: 26.4 % — ABNORMAL LOW (ref 39.0–52.0)
Hemoglobin: 8.7 g/dL — ABNORMAL LOW (ref 13.0–17.0)
Immature Granulocytes: 1 %
Lymphocytes Relative: 40 %
Lymphs Abs: 0.6 10*3/uL — ABNORMAL LOW (ref 0.7–4.0)
MCH: 28.9 pg (ref 26.0–34.0)
MCHC: 33 g/dL (ref 30.0–36.0)
MCV: 87.7 fL (ref 80.0–100.0)
Monocytes Absolute: 0.2 10*3/uL (ref 0.1–1.0)
Monocytes Relative: 12 %
Neutro Abs: 0.7 10*3/uL — ABNORMAL LOW (ref 1.7–7.7)
Neutrophils Relative %: 46 %
Platelets: 10 10*3/uL — CL (ref 150–400)
RBC: 3.01 MIL/uL — ABNORMAL LOW (ref 4.22–5.81)
RDW: 20.1 % — ABNORMAL HIGH (ref 11.5–15.5)
WBC: 1.4 10*3/uL — CL (ref 4.0–10.5)
nRBC: 0 % (ref 0.0–0.2)

## 2019-01-16 LAB — TYPE AND SCREEN
ABO/RH(D): A POS
Antibody Screen: POSITIVE

## 2019-01-16 LAB — BASIC METABOLIC PANEL
Anion gap: 5 (ref 5–15)
Anion gap: 7 (ref 5–15)
Anion gap: 9 (ref 5–15)
BUN: 14 mg/dL (ref 8–23)
BUN: 11 mg/dL (ref 8–23)
BUN: 9 mg/dL (ref 8–23)
CO2: 25 mmol/L (ref 22–32)
CO2: 23 mmol/L (ref 22–32)
CO2: 24 mmol/L (ref 22–32)
Calcium: 11.7 mg/dL — ABNORMAL HIGH (ref 8.9–10.3)
Calcium: 11.4 mg/dL — ABNORMAL HIGH (ref 8.9–10.3)
Calcium: 11 mg/dL — ABNORMAL HIGH (ref 8.9–10.3)
Chloride: 109 mmol/L (ref 98–111)
Chloride: 104 mmol/L (ref 98–111)
Chloride: 102 mmol/L (ref 98–111)
Creatinine, Ser: 0.83 mg/dL (ref 0.61–1.24)
Creatinine, Ser: 0.86 mg/dL (ref 0.61–1.24)
Creatinine, Ser: 0.76 mg/dL (ref 0.61–1.24)
GFR calc Af Amer: >60 mL/min (ref >60)
GFR calc Af Amer: >60 mL/min (ref >60)
GFR calc Af Amer: >60 mL/min (ref >60)
GFR calc non Af Amer: >60 mL/min (ref >60)
GFR calc non Af Amer: >60 mL/min (ref >60)
GFR calc non Af Amer: >60 mL/min (ref >60)
Glucose, Bld: 134 mg/dL — ABNORMAL HIGH (ref 70–99)
Glucose, Bld: 127 mg/dL — ABNORMAL HIGH (ref 70–99)
Glucose, Bld: 108 mg/dL — ABNORMAL HIGH (ref 70–99)
Potassium: 2.9 mmol/L — ABNORMAL LOW (ref 3.5–5.1)
Potassium: 2.8 mmol/L — ABNORMAL LOW (ref 3.5–5.1)
Potassium: 2.9 mmol/L — ABNORMAL LOW (ref 3.5–5.1)
Sodium: 139 mmol/L (ref 135–145)
Sodium: 134 mmol/L — ABNORMAL LOW (ref 135–145)
Sodium: 135 mmol/L (ref 135–145)

## 2019-01-16 LAB — MULTIPLE MYELOMA PANEL, SERUM
Albumin SerPl Elph-Mcnc: 3.2 g/dL (ref 2.9–4.4)
Albumin/Glob SerPl: 1 (ref 0.7–1.7)
Alpha 1: 0.3 g/dL (ref 0.0–0.4)
Alpha2 Glob SerPl Elph-Mcnc: 0.7 g/dL (ref 0.4–1.0)
B-Globulin SerPl Elph-Mcnc: 0.8 g/dL (ref 0.7–1.3)
Gamma Glob SerPl Elph-Mcnc: 1.7 g/dL (ref 0.4–1.8)
Globulin, Total: 3.5 g/dL (ref 2.2–3.9)
IgA: 11 mg/dL — ABNORMAL LOW (ref 61–437)
IgG (Immunoglobin G), Serum: 2066 mg/dL — ABNORMAL HIGH (ref 603–1613)
IgM (Immunoglobulin M), Srm: 10 mg/dL — ABNORMAL LOW (ref 20–172)
M Protein SerPl Elph-Mcnc: 1.6 g/dL — ABNORMAL HIGH
Total Protein ELP: 6.7 g/dL (ref 6.0–8.5)

## 2019-01-16 LAB — PHOSPHORUS: Phosphorus: 1.5 mg/dL — ABNORMAL LOW (ref 2.5–4.6)

## 2019-01-16 LAB — MAGNESIUM: Magnesium: 1.1 mg/dL — ABNORMAL LOW (ref 1.7–2.4)

## 2019-01-16 MED ORDER — SODIUM CHLORIDE 0.9% IV SOLUTION: Freq: Once | INTRAVENOUS | Status: DC

## 2019-01-16 MED ORDER — FLUMAZENIL 0.5 MG/5ML IV SOLN
INTRAVENOUS | Status: AC
  Filled 2019-01-16: qty 5

## 2019-01-16 MED ORDER — MIDAZOLAM HCL 2 MG/2ML IJ SOLN
INTRAMUSCULAR | Status: AC
  Filled 2019-01-16: qty 4

## 2019-01-16 MED ORDER — POTASSIUM CHLORIDE 10 MEQ/100ML IV SOLN
10.0000 meq | INTRAVENOUS | Status: AC
  Administered 2019-01-16 (×4): 10 meq via INTRAVENOUS
  Filled 2019-01-16 (×5): qty 100

## 2019-01-16 MED ORDER — POTASSIUM CHLORIDE 20 MEQ PO PACK
40.0000 meq | PACK | Freq: Once | ORAL | Status: AC
  Administered 2019-01-16: 18:00:00 40 meq via ORAL
  Filled 2019-01-16: qty 2

## 2019-01-16 MED ORDER — FENTANYL CITRATE (PF) 100 MCG/2ML IJ SOLN
INTRAMUSCULAR | Status: AC
  Filled 2019-01-16: qty 2

## 2019-01-16 MED ORDER — K PHOS MONO-SOD PHOS DI & MONO 155-852-130 MG PO TABS
250.0000 mg | ORAL_TABLET | Freq: Three times a day (TID) | ORAL | Status: DC
  Administered 2019-01-16: 22:00:00 250 mg via ORAL
  Filled 2019-01-16 (×2): qty 1

## 2019-01-16 MED ORDER — SODIUM CHLORIDE 0.9 % IV SOLN
INTRAVENOUS | Status: DC
  Administered 2019-01-16: 15:00:00 via INTRAVENOUS

## 2019-01-16 MED ORDER — LIDOCAINE HCL (PF) 1 % IJ SOLN
INTRAMUSCULAR | Status: AC | PRN
  Administered 2019-01-16: 10 mL

## 2019-01-16 MED ORDER — MIDAZOLAM HCL 2 MG/2ML IJ SOLN
INTRAMUSCULAR | Status: AC | PRN
  Administered 2019-01-16 (×2): 1 mg via INTRAVENOUS

## 2019-01-16 MED ORDER — FENTANYL CITRATE (PF) 100 MCG/2ML IJ SOLN
INTRAMUSCULAR | Status: AC | PRN
  Administered 2019-01-16 (×2): 50 ug via INTRAVENOUS

## 2019-01-16 MED ORDER — NALOXONE HCL 0.4 MG/ML IJ SOLN
INTRAMUSCULAR | Status: AC
  Filled 2019-01-16: qty 1

## 2019-01-16 MED ORDER — SODIUM PHOSPHATES 45 MMOLE/15ML IV SOLN
15.0000 mmol | Freq: Once | INTRAVENOUS | Status: AC
  Administered 2019-01-16: 20:00:00 15 mmol via INTRAVENOUS
  Filled 2019-01-16: qty 5

## 2019-01-16 MED ORDER — MAGNESIUM SULFATE 50 % IJ SOLN
3.0000 g | Freq: Once | INTRAVENOUS | Status: AC
  Administered 2019-01-16: 08:00:00 3 g via INTRAVENOUS
  Filled 2019-01-16: qty 6

## 2019-01-16 NOTE — Progress Notes (Signed)
[facility] Progress Note  Subjective:  Received platelets earlier today.  Spoke with his wife at bedside.  Mental status improving.    Review of systems  Denies shortness of breath or chest pain Denies n/v    Vitals:   01/16/19 0945 01/16/19 1351 01/16/19 1406 01/16/19 1640  BP: 123/78 134/90 (!) 143/87 137/80  Pulse: 99 87 85 86  Resp: 18 14 14 18   Temp:  97.8 F (36.6 C) 98 F (36.7 C) 97.6 F (36.4 C)  TempSrc:  Oral Oral Oral  SpO2: 100% 100% 100% 99%  Weight:      Height:        Exam: Gen frail adult male in bed in NAD; whispers  HEENT NCAT Sclera anicteric Neck supple No jvd  Chest clear bilat to bases . No rales or wheezing Heart RRR no Rubs  Abd soft ntnd Psych calm mood and affect Ext no LE or UE edema appreciated; no cyanosis or clubbing Neuro oriented to person and year; answers questions      UA - clear 6-10 rbc, 0-5 wbc, neg protein  Assessment/ Plan: 1. Hypercalcemia - due to myeloma bony infiltration most likely.  Pancytopenia supports this possibility as well - corrected calcium now 12.0 given albumin  - resume NS  - s/p calcitonin - pt's MS appears to be improving - has already 4mg  of zoledronic acid - treatment of cancer , pt getting IV decadron per oncology  2. Hypernatremia - unclear if lab effect.  Have discontinued D5W and resumed NS  3. AMS -felt secondary in part to hypercalcemia; improving  4. Mult myeloma - onset in 2018, per ONC 5. AKI - mild ^creat 1.2 but this is 2 x his normal creat of 0.6 at baseline  6. SP recent Cspine surgery 7. Pancytopenia -per ONC 8. Hypokalemia - replete potassium - additional 40 meq once now; and magnesium; s/p magnesium 3 gram today  9. hypophos - replete with additional 15 mmol once; renal panel in AM      Inpatient medications: . sodium chloride   Intravenous Once  . sodium chloride   Intravenous Once  . sodium chloride   Intravenous Once  . Chlorhexidine Gluconate Cloth  6  each Topical Daily  . docusate sodium  100 mg Oral BID  . dronabinol  10 mg Oral BID  . feeding supplement  1 Container Oral TID BM  . feeding supplement (PRO-STAT SUGAR FREE 64)  30 mL Oral BID  . gabapentin  300 mg Oral TID  . morphine  30 mg Oral Q12H  . oxymetazoline  1 spray Each Nare BID  . phosphorus  250 mg Oral TID  . senna-docusate  1 tablet Oral Daily  . sucralfate  1 g Oral TID AC & HS  . tranexamic acid  500 mg Topical Once   . sodium chloride 125 mL/hr at 01/16/19 1449   acetaminophen, bisacodyl, methocarbamol, morphine injection, ondansetron, oxyCODONE, senna-docusate, sodium chloride flush   Claudia Desanctis 01/16/2019 5:40 PM

## 2019-01-16 NOTE — Progress Notes (Addendum)
Modified Barium Swallow Progress Note  Patient Details  Name: STERLING KEARNEY MRN: [id number] Date of Birth: 07-21-1955  Today's Date: 01/16/2019  Modified Barium Swallow completed.  Full report located under Chart Review in the Imaging Section.  Brief recommendations include the following:  Clinical Impression  Pt presents with minimal oropharyngeal dysphagia without aspiration or laryngeal penetration of any consistency tested.  In general, pt's swallow is strong and timely with adequate airway protection even with sequential liquid bolus swallows.  He did demonstrate difficulty orally transiting tablet with thin barium - and after 3 unsuccesful attempts, pudding effective to transit.  In addition, decreased UES relaxation noted when swallowing masticated cracker which results in residuals without pt awareness.  Liquid swallow faciliated clearance but also resulted in trace backflow, thus recommend strict esophageal precautions.    Given pt's generalized weakness and decreased UES clearance with solids (not sensate) - dys3 diet advised at this time.  Medicine with puree- whole - starting and following with liquids.  Of note, pt did NOT cough during entire MBS - symptoms of cough with liquids reported prior to admission and during hospitalization.  SLP educated pt and his wife (present for test) to findings/recommendations.  Will follow up for dysphagia management/treatment. Anticipate pt will benefit from dysphagia treatment given his XRT to pharyngeal region.   Swallow Evaluation Recommendations       SLP Diet Recommendations: Dysphagia 3 (Mech soft) solids;Thin liquid   Liquid Administration via: Cup;Straw   Medication Administration: Whole meds with puree(start and follow with thin)   Supervision: Patient able to self feed   Compensations: Small sips/bites;Slow rate;Effortful swallow;Minimize environmental distractions(drink liquids t/o meals, start po with liquids)       Oral  Care Recommendations: Oral care BID   Other Recommendations: Clarify dietary restrictions   Kathleen Lime, MS [facility] SLP Acute Rehab Services Office [phone]  [name] 01/16/2019,2:17 PM

## 2019-01-16 NOTE — Progress Notes (Addendum)
  Speech Language Pathology Treatment: Dysphagia  Patient Details Name: Jesus [name] MRN: [id number] DOB: 15-Jan-1956 Today's Date: 01/16/2019 Time: 1194-1740 SLP Time Calculation (min) (ACUTE ONLY): 21 min  Assessment / Plan / Recommendation Clinical Impression  Today pt sitting upright in bed, awake but closes eyes frequently during session. Wife present and provides information during session regarding premorbid status.  Spouse reports pt coughing with liquids prior to admission  = with sudden onset last week.  Pt able to self feed crackers, applesauce and thin water  Today although he is weak and benefits from extra time- Slow mastication noted and pt used puree independently to moisten orally retained cracker bolus to aid transit.  No indication of aspiration c/b coughing/throat clearing however pt is dysphonic which may allow silent aspiration.    Given h/o multiple myeloma with C3 fx s/p corpectomy and C2-C4 fusion, recent coughing with intake and significant weakness/WBC low = MBS indicated to allow direct observation of swallowing, determine least restrictive diet and determine if compensation strategies are indicated.  Pt and wife agreeable to plan. MBS on schedule for 1300, RN and MD as well as pt/family made aware and agreeable.    HPI HPI: Patient is a 63 y.o. male with PMH: multiple myeloma, arthritis, cervical stenosis, who presented to hospital with nosebleed which was not able to be controlled at home despite attempts with pressure, paper towels and ice. He had similar episodes two weeks prior where bleeding lasted 8 hours before it finally stopped. Patient has had poor appetite for past couple weeks. Wife reported he seemed more confused in AM of admission. on 12/11, RN reported patient with significant difficulty with swallowing and unable to take medications and so SLP BSE ordered.  Pt underwent BSE on 01/14/2019 and was found to be too high risk for aspiration with recommendation  for npo.      SLP Plan  New goals to be determined pending instrumental study       Recommendations  Diet recommendations: (tbd following MBS) Liquids provided via: Cup Medication Administration: Whole meds with liquid                Oral Care Recommendations: Oral care QID Follow up Recommendations: Skilled Nursing facility SLP Visit Diagnosis: Dysphagia, unspecified (R13.10) Plan: New goals to be determined pending instrumental study       GO                Jesus [name] 01/16/2019, 12:05 PM  Jesus Lime, MS [place] Office [phone]

## 2019-01-16 NOTE — Care Management Important Message (Signed)
Important Message  Patient Details IM Letter given to Roque Lias SW Case Manager to present to the Patient Name: Jesus [name] MRN: [id number] Date of Birth: [birth year]   Medicare Important Message Given:  Yes     Kerin Salen 01/16/2019, 11:06 AM

## 2019-01-16 NOTE — Progress Notes (Signed)
CRITICAL VALUE ALERT  Critical Value: plts 10, wbc 1.4 k 2.8 Date & Time Notied:  12/14 0600  Provider Notified: yes  Orders Received/Actions taken: pending

## 2019-01-16 NOTE — Progress Notes (Signed)
Vascular site assessed q15 minutes x1 hour to right hip. No signs of bleeding or bruising. Mild tenderness to be expected following bone biopsy.

## 2019-01-16 NOTE — Procedures (Signed)
Interventional Radiology Procedure Note  Procedure: CT guided aspirate and core biopsy of right iliac bone Complications: None Recommendations: - Bedrest supine x 1 hrs - Hydrocodone PRN  Pain - Follow biopsy results  Signed,  Kalix Meinecke K. Rishi Vicario, MD   

## 2019-01-16 NOTE — Progress Notes (Signed)
PROGRESS NOTE    Jesus [name]  GZF:[id number] DOB: 12/08/1955 DOA: 01/11/2019 PCP: Jesus [name]    Brief Narrative:  Patient is a 63 year old male with history of multiple myeloma, arthritis, cervical stenosis who presented with nosebleed.  He had a similar episode about 2 weeks ago which lasted for 8 hours but spontaneously resolved.  He follows with oncologist at [place] to have severe thrombocytopenia at presentation and also has normocytic anemia.  Hematology/oncology consulted.  Nosebleed has stopped.  Being transfused today for anemia.  Also found to have hypercalcemia secondary to MM and was given a dose of Zometa and Decadron.  Assessment & Plan:   Active Problems:   Hypercalcemia   Epistaxis   Thrombocytopenia (HCC)   Anemia   Leukopenia   # Multiple myeloma: Follows with Dr Irene Limbo and also Dr. Blinda Leatherwood ,,oncology, at [facility].  Has history of recent C3 compression fracture status post corpectomy, C2-C4 plating.  Oncology following, suggest bone marrow biopsy by IR , completed 12/11, follow up results. Bone scan: Uptake over the right acetabulum, mid to lower thoracic spine, upper anterior ribs and possibly cervicothoracic junction likely representing patient's known multiple myeloma.   Hypercalcemia: Secondary to malignancy.  Calcium level still elevated..  Continue aggressive  IV fluids.  Given Zometa and Decadron. Nephrology consulted, suggest : Increase saline (goal 200 to 250 cc/hr),  Calcitonin.  Hypokalemia  - replete as needed  Hypernatremia: ns  @ 125 cc/hr, recheck labs  Pancytopenia: Secondary to malignancy.  Hemoglobin this morning 8.6.  S/p 1 PRBC.  Also transfused with a unit of  platelets.  Transfuse platelets if is less than 20000 and if bleeding. Recheck cbc  Nosebleed: Secondary to severe thrombocytopenia.  Stopped.  Severe protein calorie malnutrition: Will request for dietician  evaluation. Started on liquid diet     DVT prophylaxis:SCD Code Status: Full Family Communication:  discussed with wife by bedside  Disposition Plan: Home after full work-up, improvement in the pancytopenia  Consultants:   Oncology  Procedures:  bone marrow biopsy by IR   Antimicrobials: Anti-infectives (From admission, onward)   None      Subjective: Patient was seen and examined at bedside.  He appears comfortable, hemodynamically stable, denies any nosebleed, nurse reported no overnight events.   He had his bone marrow biopsy.  somewhat drowsy.  Objective: Vitals:   01/16/19 0945 01/16/19 1351 01/16/19 1406 01/16/19 1640  BP: 123/78 134/90 (!) 143/87 137/80  Pulse: 99 87 85 86  Resp: _0 Temp:  97.8 F (36.6 C) 98 F (36.7 C) 97.6 F (36.4 C)  TempSrc:  Oral Oral Oral  SpO2: 100% 100% 100% 99%  Weight:      Height:        Intake/Output Summary (Last 24 hours) at 01/16/2019 1828 Last data filed at 01/16/2019 1640 Gross [name] 24 hour  Intake 5802.24 ml  Output 3200 ml  Net 2602.24 ml   Filed Weights   01/13/19 0447 01/14/19 0415 01/15/19 0524  Weight: 52.8 kg 51.3 kg 49.7 kg    Examination:  General exam: Appears calm and comfortable, thin built,  appears generally weak. Respiratory system: Clear to auscultation. Respiratory effort normal. Cardiovascular system: S1 & S2 heard, RRR. No JVD, murmurs, rubs, gallops or clicks. No pedal edema. Gastrointestinal system: Abdomen is nondistended, soft and nontender. No organomegaly or masses felt. Normal bowel sounds heard. Central nervous system: Alert and oriented. No focal neurological deficits. Extremities:  No edema, no clubbing Skin: No rashes, lesions or ulcers Psychiatry: Judgement and insight appear normal. Mood & affect appropriate.     Data Reviewed: I have personally reviewed following labs and imaging studies  CBC: Recent Labs  Lab 01/11/19 1349 01/12/19 0859 01/13/19 0443 01/14/19 0431 01/15/19 0442 01/16/19 0526   WBC 1.9* 1.3* 1.0* 1.1* 1.5* 1.4*  NEUTROABS 0.8*  --  0.5*  --   --  0.7*  HGB 8.6* 5.6* 8.6* 9.7* 9.2* 8.7*  HCT 26.6* 17.6* 25.8* 30.1* 29.3* 26.4*  MCV 94.7 97.8 88.1 89.6 90.2 87.7  PLT 9* 29* 22* 18* 14* 10*   Basic Metabolic Panel: Recent Labs  Lab 01/14/19 0431 01/15/19 0442 01/15/19 1744 01/15/19 2235 01/16/19 0526 01/16/19 1644  NA 151* 152*  153*  --  139 134* 135  K 4.1 3.1*  3.1* 3.0* 2.9* 2.8* 2.9*  CL 119* 119*  119*  --  109 104 102  CO2 _0 --  _1 GLUCOSE 105* 163*  165*  --  134* 127* 108*  BUN _2 --  _3 CREATININE 1.16 1.05  1.06  --  0.83 0.86 0.76  CALCIUM 13.6* 12.9*  13.0*  --  11.7* 11.4* 11.0*  MG 1.8 1.7  --   --  1.1*  --   PHOS 2.3* 2.1*  --   --  1.5*  --    GFR: Estimated Creatinine Clearance: 66.4 mL/min (by C-G formula based on SCr of 0.76 mg/dL). Liver Function Tests: Recent Labs  Lab 01/11/19 1349 01/13/19 0443 01/14/19 0431 01/15/19 0442  AST 33 25 56* 36  ALT 20 16 54* 40  ALKPHOS 74 59 132* 123  BILITOT 0.5 0.5 0.8 0.5  PROT 7.7 5.9* 7.1 7.2  ALBUMIN 3.4* 2.6* 2.9* 2.9*  2.7*   No results for input(s): LIPASE, AMYLASE in the last 168 hours. No results for input(s): AMMONIA in the last 168 hours. Coagulation Profile: Recent Labs  Lab 01/11/19 1349 01/13/19 1608  INR 1.0 1.0   Cardiac Enzymes: No results for input(s): CKTOTAL, CKMB, CKMBINDEX, TROPONINI in the last 168 hours. BNP (last 3 results) No results for input(s): PROBNP in the last 8760 hours. HbA1C: No results for input(s): HGBA1C in the last 72 hours. CBG: Recent Labs  Lab 01/12/19 0953  GLUCAP 124*   Lipid Profile: No results for input(s): CHOL, HDL, LDLCALC, TRIG, CHOLHDL, LDLDIRECT in the last 72 hours. Thyroid Function Tests: No results for input(s): TSH, T4TOTAL, FREET4, T3FREE, THYROIDAB in the last 72 hours. Anemia Panel: No results for input(s): VITAMINB12, FOLATE, FERRITIN, TIBC, IRON, RETICCTPCT in the  last 72 hours. Sepsis Labs: No results for input(s): PROCALCITON, LATICACIDVEN in the last 168 hours.  Recent Results (from the past 240 hour(s))  SARS CORONAVIRUS 2 (TAT 6-24 HRS) Nasopharyngeal Nasopharyngeal Swab     Status: None   Collection Time: 01/11/19  3:32 PM   Specimen: Nasopharyngeal Swab  Result Value Ref Range Status   SARS Coronavirus 2 NEGATIVE NEGATIVE Final    Comment: (NOTE) SARS-CoV-2 target nucleic acids are NOT DETECTED. The SARS-CoV-2 RNA is generally detectable in upper and lower respiratory specimens during the acute phase of infection. Negative results do not preclude SARS-CoV-2 infection, do not rule out co-infections with other pathogens, and should not be used as the sole basis for treatment or other patient management decisions. Negative results must be combined with clinical observations, patient history, and  epidemiological information. The expected result is Negative. Fact Sheet for Patients: SugarRoll.be Fact Sheet for Healthcare Providers: https://www.woods-mathews.com/ This test is not yet approved or cleared by the [place] FDA and  has been authorized for detection and/or diagnosis of SARS-CoV-2 by FDA under an Emergency Use Authorization (EUA). This EUA will remain  in effect (meaning this test can be used) for the duration of the COVID-19 declaration under Section 56 4(b)(1) of the Act, 21 U.S.C. section 360bbb-3(b)(1), unless the authorization is terminated or revoked sooner. Performed at [facility], [place] [address] [place], [place] [address]    Radiology Studies: CT BIOPSY  Result Date: January 31, 2019 INDICATION: 63 year old male with a history of multiple myeloma status post stem cell transplant and chemotherapy. He presents with pancytopenia and severe hypercalcemia. He requires bone marrow biopsy to evaluate for progression of disease. EXAM: CT GUIDED BONE MARROW ASPIRATION AND CORE  BIOPSY Interventional Radiologist:  Criselda Peaches, MD MEDICATIONS: None. ANESTHESIA/SEDATION: Moderate (conscious) sedation was employed during this procedure. A total of 2 milligrams versed and 100 micrograms fentanyl were administered intravenously. The patient's level of consciousness and vital signs were monitored continuously by radiology nursing throughout the procedure under my direct supervision. Total monitored sedation time: 10 minutes FLUOROSCOPY TIME:  None COMPLICATIONS: None immediate. Estimated blood loss: <25 mL PROCEDURE: Informed written consent was obtained from the patient after a thorough discussion of the procedural risks, benefits and alternatives. All questions were addressed. Maximal Sterile Barrier Technique was utilized including caps, mask, sterile gowns, sterile gloves, sterile drape, hand hygiene and skin antiseptic. A timeout was performed prior to the initiation of the procedure. The patient was positioned prone and non-contrast localization CT was performed of the pelvis to demonstrate the iliac marrow spaces. Maximal barrier sterile technique utilized including caps, mask, sterile gowns, sterile gloves, large sterile drape, hand hygiene, and betadine prep. Under sterile conditions and local anesthesia, an 11 gauge coaxial bone biopsy needle was advanced into the right iliac marrow space. Needle position was confirmed with CT imaging. Initially, bone marrow aspiration was performed. Next, the 11 gauge outer cannula was utilized to obtain a right iliac bone marrow core biopsy. Needle was removed. Hemostasis was obtained with compression. The patient tolerated the procedure well. Samples were prepared with the cytotechnologist. IMPRESSION: Technically successful CT-guided bone marrow biopsy and aspiration. Signed, Criselda Peaches, MD, [facility] Radiology Electronically Signed   By: Jacqulynn Cadet M.D.   On: 01/31/19  10:58   DG Swallowing Func-Speech Pathology  Result Date: 01/31/2019 Objective Swallowing Evaluation: Type of Study: Bedside Swallow Evaluation  Patient Details Name: DONALDO TEEGARDEN MRN: [id number] Date of Birth: Jul 06, 1955 Today's Date: 01-31-2019 Time: SLP Start Time (ACUTE ONLY): 1305 -SLP Stop Time (ACUTE ONLY): 1335 SLP Time Calculation (min) (ACUTE ONLY): 30 min Past Medical History: Past Medical History: Diagnosis Date . Arthritis  . Cancer ([place])   multiple myeloma . Cervical stenosis of spinal canal  . Wears glasses  Past Surgical History: Past Surgical History: Procedure Laterality Date . ANTERIOR CERVICAL CORPECTOMY N/A 11/04/2018  Procedure: Cervical three Corpectomy with Cervical two to Cervical four Plating;  Surgeon: Erline Levine, MD;  Location: [place];  Service: Neurosurgery;  Laterality: N/A; . APPENDECTOMY   . BACK SURGERY   . EYE SURGERY Bilateral   cataract surgery with lens implants . HERNIA REPAIR Left   inguinal  . IR IMAGING GUIDED PORT INSERTION  06/21/2018 . ROTATOR CUFF REPAIR Left  HPI:  Patient is a 63 y.o. male with PMH: multiple myeloma, arthritis, cervical stenosis, who presented to hospital with nosebleed which was not able to be controlled at home despite attempts with pressure, paper towels and ice. He had similar episodes two weeks prior where bleeding lasted 8 hours before it finally stopped. Patient has had poor appetite for past couple weeks. Wife reported he seemed more confused in AM of admission. on 12/11, RN reported patient with significant difficulty with swallowing and unable to take medications and so SLP BSE ordered.  Pt underwent BSE on 01/14/2019 and was found to be too high risk for aspiration with recommendation for npo.  Subjective: pt awake in chair in flouro suite Assessment / Plan / Recommendation CHL IP CLINICAL IMPRESSIONS 01/16/2019 Clinical Impression Pt presents with minimal oropharyngeal dysphagia without aspiration or laryngeal penetration of any  consistency tested.  In general, pt's swallow is strong and timely with adequate airway protection even with sequential liquid bolus swallows.  He did demonstrate difficulty orally transiting tablet with thin barium - and after 3 unsuccesful attempts, pudding effective to transit.  In addition, decreased UES relaxation noted when swallowing masticated cracker which results in residuals without pt awareness.  Liquid swallow faciliated clearance but also resulted in trace backflow, thus recommend strict esophageal precautions.  Given pt's generalized weakness and decreased UES clearance with solids (not sensate) - dys3 diet advised at this time.  Medicine with puree- whole - starting and following with liquids.  Of note, pt did NOT cough during entire MBS - symptoms of cough with liquids reported prior to admission and during hospitalization.  SLP educated pt and his wife (present for test) to findings/recommendations.  Will follow up for dysphagia management/treatment. Anticipate pt will benefit from dysphagia treatment given his XRT to pharyngeal region. SLP Visit Diagnosis Dysphagia, oropharyngeal phase (R13.12) Attention and concentration deficit following -- Frontal lobe and executive function deficit following -- Impact on safety and function Mild aspiration risk   CHL IP TREATMENT RECOMMENDATION 01/16/2019 Treatment Recommendations Therapy as outlined in treatment plan below   Prognosis 01/16/2019 Prognosis for Safe Diet Advancement Good Barriers to Reach Goals -- Barriers/Prognosis Comment -- CHL IP DIET RECOMMENDATION 01/16/2019 SLP Diet Recommendations Dysphagia 3 (Mech soft) solids;Thin liquid Liquid Administration via Cup;Straw Medication Administration Whole meds with puree Compensations Small sips/bites;Slow rate;Effortful swallow;Minimize environmental distractions Postural Changes --   CHL IP OTHER RECOMMENDATIONS 01/16/2019 Recommended Consults -- Oral Care Recommendations Oral care BID Other  Recommendations Clarify dietary restrictions   CHL IP FOLLOW UP RECOMMENDATIONS 01/16/2019 Follow up Recommendations Skilled Nursing facility   [facility] IP FREQUENCY AND DURATION 01/16/2019 Speech Therapy Frequency (ACUTE ONLY) min 2x/week Treatment Duration 2 weeks      CHL IP ORAL PHASE 01/16/2019 Oral Phase Impaired Oral - Pudding Teaspoon -- Oral - Pudding Cup -- Oral - Honey Teaspoon -- Oral - Honey Cup -- Oral - Nectar Teaspoon -- Oral - Nectar Cup WFL Oral - Nectar Straw -- Oral - Thin Teaspoon -- Oral - Thin Cup WFL Oral - Thin Straw WFL Oral - Puree WFL Oral - Mech Soft WFL Oral - Regular -- Oral - Multi-Consistency -- Oral - Pill Reduced posterior propulsion;Weak lingual manipulation Oral Phase - Comment pt needed pudding to orally transit tablet after unable x3 with liquids  CHL IP PHARYNGEAL PHASE 01/16/2019 Pharyngeal Phase WFL;Impaired Pharyngeal- Pudding Teaspoon -- Pharyngeal -- Pharyngeal- Pudding Cup -- Pharyngeal -- Pharyngeal- Honey Teaspoon -- Pharyngeal -- Pharyngeal- Honey Cup -- Pharyngeal -- Pharyngeal- Nectar Teaspoon --  Pharyngeal -- Pharyngeal- Nectar Cup [facility] Pharyngeal Material does not enter airway Pharyngeal- Nectar Straw -- Pharyngeal -- Pharyngeal- Thin Teaspoon -- Pharyngeal -- Pharyngeal- Thin Cup [facility] Pharyngeal Material does not enter airway Pharyngeal- Thin Straw WFL Pharyngeal Material does not enter airway Pharyngeal- Puree WFL Pharyngeal Material does not enter airway Pharyngeal- Mechanical Soft Pharyngeal residue - cp segment Pharyngeal Material does not enter airway Pharyngeal- Regular NT Pharyngeal -- Pharyngeal- Multi-consistency -- Pharyngeal -- Pharyngeal- Pill WFL Pharyngeal Material does not enter airway Pharyngeal Comment --  CHL IP CERVICAL ESOPHAGEAL PHASE 01/16/2019 Cervical Esophageal Phase Impaired Pudding Teaspoon -- Pudding Cup -- Honey Teaspoon -- Honey Cup -- Nectar Teaspoon -- Nectar Cup WFL Nectar Straw -- Thin Teaspoon -- Thin Cup WFL Thin Straw Reduced  cricopharyngeal relaxation;Esophageal backflow into cervical esophagus Puree WFL Mechanical Soft Reduced cricopharyngeal relaxation;Esophageal backflow into cervical esophagus Regular -- Multi-consistency -- Pill WFL Cervical Esophageal Comment pt without sensation to minimal residuals at pyriform/above UES region, liquid assisted clearance but also resulted in trace backflow, barium tablet swallowed with pudding appeared to transit into sttomach without delay Kathleen Lime, MS [facility] SLP Acute Rehab Services Office [phone] [name] 01/16/2019, 2:19 PM              CT BONE MARROW BIOPSY & ASPIRATION  Result Date: 01/16/2019 INDICATION: 63 year old male with a history of multiple myeloma status post stem cell transplant and chemotherapy. He presents with pancytopenia and severe hypercalcemia. He requires bone marrow biopsy to evaluate for progression of disease. EXAM: CT GUIDED BONE MARROW ASPIRATION AND CORE BIOPSY Interventional Radiologist:  Criselda Peaches, MD MEDICATIONS: None. ANESTHESIA/SEDATION: Moderate (conscious) sedation was employed during this procedure. A total of 2 milligrams versed and 100 micrograms fentanyl were administered intravenously. The patient's level of consciousness and vital signs were monitored continuously by radiology nursing throughout the procedure under my direct supervision. Total monitored sedation time: 10 minutes FLUOROSCOPY TIME:  None COMPLICATIONS: None immediate. Estimated blood loss: <25 mL PROCEDURE: Informed written consent was obtained from the patient after a thorough discussion of the procedural risks, benefits and alternatives. All questions were addressed. Maximal Sterile Barrier Technique was utilized including caps, mask, sterile gowns, sterile gloves, sterile drape, hand hygiene and skin antiseptic. A timeout was performed prior to the initiation of the procedure. The patient was positioned prone and non-contrast localization CT was performed of  the pelvis to demonstrate the iliac marrow spaces. Maximal barrier sterile technique utilized including caps, mask, sterile gowns, sterile gloves, large sterile drape, hand hygiene, and betadine prep. Under sterile conditions and local anesthesia, an 11 gauge coaxial bone biopsy needle was advanced into the right iliac marrow space. Needle position was confirmed with CT imaging. Initially, bone marrow aspiration was performed. Next, the 11 gauge outer cannula was utilized to obtain a right iliac bone marrow core biopsy. Needle was removed. Hemostasis was obtained with compression. The patient tolerated the procedure well. Samples were prepared with the cytotechnologist. IMPRESSION: Technically successful CT-guided bone marrow biopsy and aspiration. Signed, Criselda Peaches, MD, [facility] Radiology Electronically Signed   By: Jacqulynn Cadet M.D.   On: 01/16/2019 10:58   Scheduled Meds: . sodium chloride   Intravenous Once  . sodium chloride   Intravenous Once  . sodium chloride   Intravenous Once  . Chlorhexidine Gluconate Cloth  6 each Topical Daily  . docusate sodium  100 mg Oral BID  . dronabinol  10 mg Oral BID  . feeding  supplement  1 Container Oral TID BM  . feeding supplement (PRO-STAT SUGAR FREE 64)  30 mL Oral BID  . gabapentin  300 mg Oral TID  . morphine  30 mg Oral Q12H  . oxymetazoline  1 spray Each Nare BID  . phosphorus  250 mg Oral TID  . phosphorus  250 mg Oral TID  . senna-docusate  1 tablet Oral Daily  . sucralfate  1 g Oral TID AC & HS  . tranexamic acid  500 mg Topical Once   Continuous Infusions: . sodium chloride 125 mL/hr at 01/16/19 1449  . sodium phosphate  Dextrose 5% IVPB       LOS: 4 days    Time spent:    Jesus Poplin Harmon Pier, MD [facility]   If 7PM-7AM, please contact night-coverage

## 2019-01-16 NOTE — Progress Notes (Addendum)
[name]   HEMATOLOGY/ONCOLOGY INPATIENT PROGRESS NOTE  Date of Service: 01/16/2019  Inpatient Attending: .Vicenta Dunning, MD   SUBJECTIVE  Patient still having some periods of confusion and somnolence. No longer having epistaxis or any other bleeding.  He is not complaining of any pain at this time.  Scheduled to receive 1 unit of platelets today for platelet count 10,000. Hemoglobin currently stable at 8.7.  His total white count is 1.4 with an ANC of 0.7. This is consistent with previously reported values.  He is not febrile.  Bone marrow biopsy was performed earlier today. 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     OBJECTIVE:  PHYSICAL EXAMINATION: . Vitals:   01/16/19 0514 01/16/19 0933 01/16/19 0940 01/16/19 0945  BP: (!) 141/80 140/87 122/79 123/78  Pulse: 86 100 100 99  Resp: 17 (!) '26 20 18  ' Temp: 98.3 F (36.8 C)     TempSrc: Oral     SpO2: 100% 100% 100% 100%  Weight:      Height:       Filed Weights   01/13/19 0447 01/14/19 0415 01/15/19 0524  Weight: 116 lb 6.5 oz (52.8 kg) 113 lb 1.5 oz (51.3 kg) 109 lb 9.1 oz (49.7 kg)   .Body mass index is 17.16 kg/m.  GENERAL:alert, resolved epistaxis.  Looks comfortable SKIN: Decreased skin turgor suggesting dehydration EYES: Sunken eyes OROPHARYNX: Dry mucous membranes  NECK: supple, LYMPH:  no palpable lymphadenopathy in the cervical, axillary or inguinal LUNGS: clear to auscultation with normal respiratory effort HEART: regular rate & rhythm,  no murmurs and no lower extremity edema ABDOMEN: abdomen soft, non-tender, normoactive bowel sounds  Musculoskeletal: no cyanosis of digits and no clubbing  PSYCH: alert & somewhat confused and a little sleepy. NEURO: no focal motor/sensory deficits.  Minimally conversant -received mild sedation during bone marrow biopsy procedure   MEDICAL HISTORY:  Past Medical History:  Diagnosis Date  . Arthritis   . Cancer ([place])     multiple myeloma  . Cervical stenosis of spinal canal   . Wears glasses     SURGICAL HISTORY: Past Surgical History:  Procedure Laterality Date  . ANTERIOR CERVICAL CORPECTOMY N/A 11/04/2018   Procedure: Cervical three Corpectomy with Cervical two to Cervical four Plating;  Surgeon: Erline Levine, MD;  Location: [facility];  Service: Neurosurgery;  Laterality: N/A;  . APPENDECTOMY    . BACK SURGERY    . EYE SURGERY Bilateral    cataract surgery with lens implants  . HERNIA REPAIR Left    inguinal   . IR IMAGING GUIDED PORT INSERTION  06/21/2018  . ROTATOR CUFF REPAIR Left     SOCIAL HISTORY: Social History   Socioeconomic History  . Marital status: Married    Spouse name: Not on file  . Number of children: Not on file  . Years of education: Not on file  . Highest education level: Not on file  Occupational History  . Not on file  Tobacco Use  . Smoking status: Never Smoker  . Smokeless tobacco: Never Used  Substance and Sexual Activity  . Alcohol use: Never  . Drug use: Never  . Sexual activity: Not on file  Other Topics Concern  . Not on file  Social History Narrative  . Not on file   Social Determinants of Health   Financial Resource Strain:   . Difficulty of Paying Living Expenses: Not on file  Food Insecurity:   .  Worried About Charity fundraiser in the Last Year: Not on file  . Ran Out of Food in the Last Year: Not on file  Transportation Needs: No Transportation Needs  . Lack of Transportation (Medical): No  . Lack of Transportation (Non-Medical): No  Physical Activity:   . Days of Exercise per Week: Not on file  . Minutes of Exercise per Session: Not on file  Stress:   . Feeling of Stress : Not on file  Social Connections:   . Frequency of Communication with Friends and Family: Not on file  . Frequency of Social Gatherings with Friends and Family: Not on file  . Attends Religious Services: Not on file  . Active Member of Clubs or Organizations: Not on  file  . Attends Archivist Meetings: Not on file  . Marital Status: Not on file  Intimate Partner Violence:   . Fear of Current or Ex-Partner: Not on file  . Emotionally Abused: Not on file  . Physically Abused: Not on file  . Sexually Abused: Not on file    FAMILY HISTORY: Family History  Problem Relation Age of Onset  . Breast cancer Sister     ALLERGIES:  is allergic to penicillins.  MEDICATIONS:  Scheduled Meds: . sodium chloride   Intravenous Once  . sodium chloride   Intravenous Once  . sodium chloride   Intravenous Once  . Chlorhexidine Gluconate Cloth  6 each Topical Daily  . docusate sodium  100 mg Oral BID  . dronabinol  10 mg Oral BID  . feeding supplement  1 Container Oral TID BM  . feeding supplement (PRO-STAT SUGAR FREE 64)  30 mL Oral BID  . gabapentin  300 mg Oral TID  . morphine  30 mg Oral Q12H  . oxymetazoline  1 spray Each Nare BID  . phosphorus  250 mg Oral TID  . senna-docusate  1 tablet Oral Daily  . sucralfate  1 g Oral TID AC & HS  . tranexamic acid  500 mg Topical Once   Continuous Infusions: . sodium chloride 75 mL/hr at 01/15/19 2044  . dextrose 150 mL/hr at 01/16/19 0334   PRN Meds:.  REVIEW OF SYSTEMS:    10 Point review of Systems was done is negative except as noted above.   LABORATORY DATA:  I have reviewed the data as listed  . CBC Latest Ref Rng & Units 01/16/2019 01/15/2019 01/14/2019  WBC 4.0 - 10.5 K/uL 1.4(LL) 1.5(L) 1.1(LL)  Hemoglobin 13.0 - 17.0 g/dL 8.7(L) 9.2(L) 9.7(L)  Hematocrit 39.0 - 52.0 % 26.4(L) 29.3(L) 30.1(L)  Platelets 150 - 400 K/uL 10(LL) 14(LL) 18(LL)    . CMP Latest Ref Rng & Units 01/16/2019 01/15/2019 01/15/2019  Glucose 70 - 99 mg/dL 127(H) 134(H) -  BUN 8 - 23 mg/dL 11 14 -  Creatinine 0.61 - 1.24 mg/dL 0.86 0.83 -  Sodium 135 - 145 mmol/L 134(L) 139 -  Potassium 3.5 - 5.1 mmol/L 2.8(L) 2.9(L) 3.0(L)  Chloride 98 - 111 mmol/L 104 109 -  CO2 22 - 32 mmol/L 23 25 -  Calcium 8.9 -  10.3 mg/dL 11.4(H) 11.7(H) -  Total Protein 6.5 - 8.1 g/dL - - -  Total Bilirubin 0.3 - 1.2 mg/dL - - -  Alkaline Phos 38 - 126 U/L - - -  AST 15 - 41 U/L - - -  ALT 0 - 44 U/L - - -     RADIOGRAPHIC STUDIES: I have personally reviewed the radiological  images as listed and agreed with the findings in the report. NM Bone Scan Whole Body  Result Date: 01/12/2019 CLINICAL DATA:  History of multiple myeloma with acute back pain. EXAM: NUCLEAR MEDICINE WHOLE BODY BONE SCAN TECHNIQUE: Whole body anterior and posterior images were obtained approximately 3 hours after intravenous injection of radiopharmaceutical. RADIOPHARMACEUTICALS:  21.8 mCi Technetium-56mMDP IV COMPARISON:  PET-CT 10/13/2018 FINDINGS: Examination demonstrates patchy increased uptake over the mid to lower thoracic spine over known areas of fusion hardware and bone destruction representing patient's known multiple myeloma. Mild uptake over the right acetabulum likely representing patient's multiple myeloma. Minimal hazy uptake on the anterior image at the cervicothoracic junction which may represent patient's known myelomatous involvement of the cervical spine. Minimal patchy uptake over several anterior upper ribs bilaterally which may be due to patient's myomatous disease. Minimal degenerative changes over the shoulders and knees. IMPRESSION: 1. Uptake over the right acetabulum, mid to lower thoracic spine, upper anterior ribs and possibly cervicothoracic junction likely representing patient's known multiple myeloma. 2.  Degenerative changes as described. Electronically Signed   By: DMarin OlpM.D.   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 He presents with pancytopenia and severe hypercalcemia. He requires bone marrow biopsy to evaluate for progression of disease. EXAM: CT GUIDED BONE MARROW  ASPIRATION AND CORE BIOPSY Interventional Radiologist:  HCriselda Peaches MD MEDICATIONS: None. ANESTHESIA/SEDATION: Moderate (conscious) sedation was employed during this procedure. A total of 2 milligrams versed and 100 micrograms fentanyl were administered intravenously. The patient's level of consciousness and vital signs were monitored continuously by radiology nursing throughout the procedure under my direct supervision. Total monitored sedation time: 10 minutes FLUOROSCOPY TIME:  None COMPLICATIONS: None immediate. Estimated blood loss: <25 mL PROCEDURE: Informed written consent was obtained from the patient after a thorough discussion of the procedural risks, benefits and alternatives. All questions were addressed. Maximal Sterile Barrier Technique was utilized including caps, mask, sterile gowns, sterile gloves, sterile drape, hand hygiene and skin antiseptic. A timeout was performed prior to the initiation of the procedure. The patient was positioned prone and non-contrast localization CT was performed of the pelvis to demonstrate the iliac marrow spaces. Maximal barrier sterile technique utilized including caps, mask, sterile gowns, sterile gloves, large sterile drape, hand hygiene, and betadine prep. Under sterile conditions and local anesthesia, an 11 gauge coaxial bone biopsy needle was advanced into the right iliac marrow space. Needle position was confirmed with CT imaging. Initially, bone marrow aspiration was performed. Next, the 11 gauge outer cannula was utilized to obtain a right iliac bone marrow core biopsy. Needle was removed. Hemostasis was obtained with compression. The patient tolerated the procedure well. Samples were prepared with the cytotechnologist. IMPRESSION: Technically successful CT-guided bone marrow biopsy and aspiration. Signed, HCriselda Peaches MD, [facility] Electronically Signed   By: HJacqulynn Cadet M.D.   On: 01/16/2019 10:58   CT BONE MARROW BIOPSY & ASPIRATION  Result Date: 01/16/2019 INDICATION: 63year old male with a history of multiple myeloma status post stem cell transplant and chemotherapy. He presents with pancytopenia and severe hypercalcemia. He requires bone marrow biopsy to evaluate for progression of disease. EXAM: CT GUIDED BONE MARROW ASPIRATION AND CORE BIOPSY Interventional Radiologist:  HCriselda Peaches MD MEDICATIONS: None. ANESTHESIA/SEDATION: Moderate (conscious) sedation was employed during this procedure. A total of 2 milligrams versed  and 100 micrograms fentanyl were administered intravenously. The patient's level of consciousness and vital signs were monitored continuously by radiology nursing throughout the procedure under my direct supervision. Total monitored sedation time: 10 minutes FLUOROSCOPY TIME:  None COMPLICATIONS: None immediate. Estimated blood loss: <25 mL PROCEDURE: Informed written consent was obtained from the patient after a thorough discussion of the procedural risks, benefits and alternatives. All questions were addressed. Maximal Sterile Barrier Technique was utilized including caps, mask, sterile gowns, sterile gloves, sterile drape, hand hygiene and skin antiseptic. A timeout was performed prior to the initiation of the procedure. The patient was positioned prone and non-contrast localization CT was performed of the pelvis to demonstrate the iliac marrow spaces. Maximal barrier sterile technique utilized including caps, mask, sterile gowns, sterile gloves, large sterile drape, hand hygiene, and betadine prep. Under sterile conditions and local anesthesia, an 11 gauge coaxial bone biopsy needle was advanced into the right iliac marrow space. Needle position was confirmed with CT imaging. Initially, bone marrow aspiration was performed. Next, the 11 gauge outer cannula was utilized to obtain a right iliac bone marrow core biopsy. Needle was removed.  Hemostasis was obtained with compression. The patient tolerated the procedure well. Samples were prepared with the cytotechnologist. IMPRESSION: Technically successful CT-guided bone marrow biopsy and aspiration. Signed, Criselda Peaches, MD, [place] [facility] Radiology Electronically Signed   By: Jacqulynn Cadet M.D.   On: 01/16/2019 10:58    ASSESSMENT & PLAN:   63 y.o. male with  1. 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 IgG at 2220. 08/11/16 PET/CT revealed Hypermetabolic large soft tissue mass in the lower thoracic paraspinal region associated with lytic destruction of T10 vertebral body, concerning for multiple myeloma. Additional smaller lytic lesions involving the skeleton. S/p RT x 4 fractions, discontinued due to T10 pathologic fracture with severe cord compression S/p 08/26/16 T10 corpectomy and T8-L1 posterior spinal fusion   Began 7 cycles of VRd on 09/24/16 S/p autologous stem cell transplant, Day 0 on 02/23/17 04/21/17 BM Bx with residual less than 5% lambda light chain restricted plasma cells in the bone marrow. M Spike at 0.4g Began maintenance 6m/m2 Carfilzomib every 2 weeks on 06/16/17  06/09/18 PET/CT revealed "Multifocal hypermetabolic lytic lesions in the skeleton as noted above, compatible with active myeloma. In the spine, the most notable active lesion of concern is at the T12 level with there is a large right eccentric vertebral body lesion with demineralization of the cortex and likely some paravertebral extension of tumor. This could cause loosening of the right pedicle screw. Intraspinal extension of tumor is difficult to exclude. MRI might be considered although the posterolateral rod and pedicle screws may cause artifact. 2. There  is likewise cortical breakthrough associated with the lytic lesion along the left upper acetabulum. 3. A lesion in the left intertrochanteric area of the femur is large enough to potentially cause biomechanical weakening which increases risk of fracture. 4. Other skeletal sites of active involvement are detailed above in the skeleton section. 5. Several lucent lesions are observed including the calvarium, T1 vertebral body, and left L4 pedicle which are not hypermetabolic and may represent effectively previously treated lesions. 6. 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  There is also a lipoma along the right brachialis muscle. 7. Nonspecific subcutaneous edema especially in the right forearm but also extending up into the right upper arm. Cause is uncertain. There is some low-grade nonfocal metabolic activity along this area. Strictly speaking, cellulitis is not excluded, correlate with clinical history and visual inspection. 8. Other imaging findings of potential clinical significance: Aortic Atherosclerosis. Mild cardiomegaly. Cholelithiasis. Prostatomegaly."  06/17/18 MMP revealed M Protein at 0.5g, just before beginning C1 Daratumumab  06/30/18 BM Biopsy revealed normocellular marrow with minimal involvement by plasma cell neoplasm (5% plasma cells), normal cytogenetics.  06/30/18 Molecular Cytogenetics did not have enough material for testing.  10/13/2018 PET whole body revealed "1. Mixed response to therapy with some lesions decreased significantly in metabolic activity and others increased in metabolic activity as well size on the CT portion exam. 2. Expansion of lytic lesion at C3 with loss of a large portion of the vertebral body structure and intense metabolic activity. Recommend non emergent neuro surgical consultation for evaluation of this destructive C3 lesion. Dedicated CT or MRI of the neck may also provide more  information. 3. Increase in size and metabolic activity of RIGHT iliac lesion and T5 vertebral body lesion. 4. Marked improvement with reduction in metabolic activity of RIGHT humerus and scapular metastatic lesions as well as marked reduction in metabolic activity large LEFT iliac bone lesion. 5. No evidence of soft tissue plasmacytoma or metastasis."  #2 severe hypercalcemia likely due to progression of his multiple myeloma.  Mild confusion and significant dehydration.  #3 severe uncontrolled epistaxis likely from dry mucous membranes plus severe thrombocytopenia   PLAN: -Discussed lab work today with the patient.  No family at the bedside. - status post 2 units PRBCs on 01/12/2019 and now with stable hemoglobin 8.7 today.  No additional transfusion indicated today.  Continue to monitor CBC closely and transfuse for hemoglobin less than 8 or symptomatic. -Transfuse platelets as needed to keep platelets more than 20k in the setting of severe epistaxis.  Platelet count is 10,000 today and recommend 1 unit of platelets to be transfused. 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  Bone marrow biopsy was performed earlier today in interventional radiology and results are currently pending.  We will continue to follow-up on these results. -Continue aggressive IV hydration for his hypercalcemia. -He is status post dexamethasone 12 mg and Zometa to try to help control his hypercalcemia.  He also received 4 doses of calcitonin over the weekend.  Calcium down to 11.7 this morning. -Bone scan shows active bone disease. -Patient has been following with Dr. Blinda Leatherwood at [facility] to determine clinical trial options. -We will discuss with him other salvage treatment options based on goals of care.   Mikey Bussing, DNP, AGPCNP-BC, AOCNP  01/16/2019 12:26 PM   ADDENDUM  .Patient was Personally and independently  interviewed, examined and relevant elements of the history of present illness were reviewed in details and an assessment and plan was created. All elements of the patient's history of present illness , assessment and plan were discussed in details with  Mikey Bussing, DNP. The above documentation reflects our combined findings assessment and plan.  Awaiting BM Bx results. Discharge planning based on blood count stability and functional status and stability of hypercalcemia.  Sullivan Lone MD MS

## 2019-01-17 DIAGNOSIS — D701 Agranulocytosis secondary to cancer chemotherapy: Secondary | ICD-10-CM

## 2019-01-17 LAB — RENAL FUNCTION PANEL
Albumin: 2.6 g/dL — ABNORMAL LOW (ref 3.5–5.0)
Anion gap: 4 — ABNORMAL LOW (ref 5–15)
BUN: 12 mg/dL (ref 8–23)
CO2: 26 mmol/L (ref 22–32)
Calcium: 11 mg/dL — ABNORMAL HIGH (ref 8.9–10.3)
Chloride: 108 mmol/L (ref 98–111)
Creatinine, Ser: 0.93 mg/dL (ref 0.61–1.24)
GFR calc Af Amer: >60 mL/min (ref >60)
GFR calc non Af Amer: >60 mL/min (ref >60)
Glucose, Bld: 101 mg/dL — ABNORMAL HIGH (ref 70–99)
Phosphorus: 4.3 mg/dL (ref 2.5–4.6)
Potassium: 3.4 mmol/L — ABNORMAL LOW (ref 3.5–5.1)
Sodium: 138 mmol/L (ref 135–145)

## 2019-01-17 LAB — BPAM PLATELET PHERESIS
Blood Product Expiration Date: 202012152359
ISSUE DATE / TIME: 202012141343
Unit Type and Rh: 6200

## 2019-01-17 LAB — BASIC METABOLIC PANEL
Anion gap: 9 (ref 5–15)
BUN: 10 mg/dL (ref 8–23)
CO2: 22 mmol/L (ref 22–32)
Calcium: 10.9 mg/dL — ABNORMAL HIGH (ref 8.9–10.3)
Chloride: 104 mmol/L (ref 98–111)
Creatinine, Ser: 0.8 mg/dL (ref 0.61–1.24)
GFR calc Af Amer: >60 mL/min (ref >60)
GFR calc non Af Amer: >60 mL/min (ref >60)
Glucose, Bld: 114 mg/dL — ABNORMAL HIGH (ref 70–99)
Potassium: 3.2 mmol/L — ABNORMAL LOW (ref 3.5–5.1)
Sodium: 135 mmol/L (ref 135–145)

## 2019-01-17 LAB — PREPARE PLATELET PHERESIS: Unit division: 0

## 2019-01-17 LAB — CBC
HCT: 24.1 % — ABNORMAL LOW (ref 39.0–52.0)
Hemoglobin: 7.8 g/dL — ABNORMAL LOW (ref 13.0–17.0)
MCH: 28.2 pg (ref 26.0–34.0)
MCHC: 32.4 g/dL (ref 30.0–36.0)
MCV: 87 fL (ref 80.0–100.0)
Platelets: 61 10*3/uL — ABNORMAL LOW (ref 150–400)
RBC: 2.77 MIL/uL — ABNORMAL LOW (ref 4.22–5.81)
RDW: 19.6 % — ABNORMAL HIGH (ref 11.5–15.5)
WBC: 1.1 10*3/uL — CL (ref 4.0–10.5)
nRBC: 0 % (ref 0.0–0.2)

## 2019-01-17 LAB — MAGNESIUM: Magnesium: 1.8 mg/dL (ref 1.7–2.4)

## 2019-01-17 MED ORDER — POTASSIUM CHLORIDE 20 MEQ PO PACK
40.0000 meq | PACK | Freq: Every day | ORAL | Status: DC
  Administered 2019-01-17: 40 meq via ORAL

## 2019-01-17 MED ORDER — POTASSIUM CHLORIDE 20 MEQ PO PACK
20.0000 meq | PACK | Freq: Once | ORAL | Status: DC
  Filled 2019-01-17: qty 1

## 2019-01-17 NOTE — Progress Notes (Signed)
PROGRESS NOTE    Jesus [name]  GBT:[id number] DOB: 12/27/1955 DOA: 01/11/2019 PCP: Patient, No Pcp Per    Brief Narrative:  Patient is a 63 year old male with history of multiple myeloma, arthritis, cervical stenosis who presented with nosebleed.  He had a similar episode about 2 weeks ago which lasted for 8 hours but spontaneously resolved.  He follows with oncologist at [facility] to have severe thrombocytopenia at presentation and also has normocytic anemia.  Hematology/oncology consulted.  Nosebleed has stopped.  Being transfused today for anemia.  Also found to have hypercalcemia secondary to MM and was given a dose of Zometa and Decadron.  Assessment & Plan:   Active Problems:   Hypercalcemia   Epistaxis   Thrombocytopenia (HCC)   Anemia   Leukopenia   # Multiple myeloma: Follows with Dr Irene Limbo and also Dr. Blinda Leatherwood ,,oncology, at [facility].  Has history of recent C3 compression fracture status post corpectomy, C2-C4 plating.  Oncology following, suggest bone marrow biopsy by IR , completed 12/11, follow up results. Bone scan: Uptake over the right acetabulum, mid to lower thoracic spine, upper anterior ribs and possibly cervicothoracic junction likely representing patient's known multiple myeloma.  Patient has history of an aggressive 17P mutated multiple myeloma currently on daratumumab  Hypercalcemia: Secondary to malignancy.  Calcium level still elevated..  Continue aggressive  IV fluids.  Given Zometa and Decadron. Nephrology consulted, suggest : Increase saline (goal 200 to 250 cc/hr),  Calcitonin.  Hypokalemia  - replete as needed  Hypernatremia: ns  @ 125 cc/hr, recheck labs  Pancytopenia: Secondary to malignancy.  Hemoglobin this morning 8.6.  S/p 1 PRBC.  Also transfused with a unit of  platelets.  Transfuse platelets if is less than 20000 and if bleeding. Recheck cbc  Nosebleed: Secondary to severe thrombocytopenia.  Stopped.  Severe  protein calorie malnutrition: Will request for dietician  evaluation. Started on liquid diet    Neutropenia -awaiting bone marrow biopsy. Patient is currently afebrile.  Will defer further management to oncology    DVT prophylaxis:SCD Code Status: Full Family Communication:  discussed with wife by bedside  Disposition Plan: Home after full work-up, improvement in the pancytopenia  Consultants:   Oncology  Procedures:  bone marrow biopsy by IR   Antimicrobials: Anti-infectives (From admission, onward)   None      Subjective: Patient was seen and examined at bedside.  He appears comfortable, hemodynamically stable, denies any nosebleed, nurse reported no overnight events.   He is inquiring about results of his bone marrow biopsy.  Able to tolerate liquid diet.  Patient is neutropenic with white cell count around 1.1.  Platelets have increased to 60.  Calcium slightly elevated, potassium is low Objective: Vitals:   01/16/19 2031 01/17/19 0500 01/17/19 0524 01/17/19 1815  BP: 118/71  109/71 107/66  Pulse: 97  91 89  Resp: 17  17   Temp: 98.1 F (36.7 C)  98.2 F (36.8 C) 97.9 F (36.6 C)  TempSrc: Oral  Oral Oral  SpO2: 98%  100% 100%  Weight:  49.5 kg    Height:        Intake/Output Summary (Last 24 hours) at 01/17/2019 1853 Last data filed at 01/17/2019 1500 Gross per 24 hour  Intake 2304.99 ml  Output 1500 ml  Net 804.99 ml   Filed Weights   01/14/19 0415 01/15/19 0524 01/17/19 0500  Weight: 51.3 kg 49.7 kg 49.5 kg    Examination:  General exam: Appears calm  and comfortable, thin built,  appears generally weak. Respiratory system: Clear to auscultation. Respiratory effort normal. Cardiovascular system: S1 & S2 heard, RRR. No JVD, murmurs, rubs, gallops or clicks. No pedal edema. Gastrointestinal system: Abdomen is nondistended, soft and nontender. No organomegaly or masses felt. Normal bowel sounds heard. Central nervous system: Alert and oriented. No  focal neurological deficits. Extremities: No edema, no clubbing Skin: No rashes, lesions or ulcers Psychiatry: Judgement and insight appear normal. Mood & affect appropriate.     Data Reviewed: I have personally reviewed following labs and imaging studies  CBC: Recent Labs  Lab 01/11/19 1349 01/13/19 0443 01/14/19 0431 01/15/19 0442 01/16/19 0526 01/17/19 0559  WBC 1.9* 1.0* 1.1* 1.5* 1.4* 1.1*  NEUTROABS 0.8* 0.5*  --   --  0.7*  --   HGB 8.6* 8.6* 9.7* 9.2* 8.7* 7.8*  HCT 26.6* 25.8* 30.1* 29.3* 26.4* 24.1*  MCV 94.7 88.1 89.6 90.2 87.7 87.0  PLT 9* 22* 18* 14* 10* 61*   Basic Metabolic Panel: Recent Labs  Lab 01/14/19 0431 01/15/19 0442 01/15/19 2235 01/16/19 0526 01/16/19 1644 01/16/19 2342 01/17/19 0559  NA 151* 152*  153* 139 134* 135 135 138  K 4.1 3.1*  3.1* 2.9* 2.8* 2.9* 3.2* 3.4*  CL 119* 119*  119* 109 104 102 104 108  CO2 '27 26  27 25 23 24 22 26  ' GLUCOSE 105* 163*  165* 134* 127* 108* 114* 101*  BUN '23 23  22 14 11 9 10 12  ' CREATININE 1.16 1.05  1.06 0.83 0.86 0.76 0.80 0.93  CALCIUM 13.6* 12.9*  13.0* 11.7* 11.4* 11.0* 10.9* 11.0*  MG 1.8 1.7  --  1.1*  --   --  1.8  PHOS 2.3* 2.1*  --  1.5*  --   --  4.3   GFR: Estimated Creatinine Clearance: 56.9 mL/min (by C-G formula based on SCr of 0.93 mg/dL). Liver Function Tests: Recent Labs  Lab 01/11/19 1349 01/13/19 0443 01/14/19 0431 01/15/19 0442 01/17/19 0559  AST 33 25 56* 36  --   ALT 20 16 54* 40  --   ALKPHOS 74 59 132* 123  --   BILITOT 0.5 0.5 0.8 0.5  --   PROT 7.7 5.9* 7.1 7.2  --   ALBUMIN 3.4* 2.6* 2.9* 2.9*  2.7* 2.6*   No results for input(s): LIPASE, AMYLASE in the last 168 hours. No results for input(s): AMMONIA in the last 168 hours. Coagulation Profile: Recent Labs  Lab 01/11/19 1349 01/13/19 1608  INR 1.0 1.0   Cardiac Enzymes: No results for input(s): CKTOTAL, CKMB, CKMBINDEX, TROPONINI in the last 168 hours. BNP (last 3 results) No results for input(s):  PROBNP in the last 8760 hours. HbA1C: No results for input(s): HGBA1C in the last 72 hours. CBG: Recent Labs  Lab 01/12/19 0953  GLUCAP 124*   Lipid Profile: No results for input(s): CHOL, HDL, LDLCALC, TRIG, CHOLHDL, LDLDIRECT in the last 72 hours. Thyroid Function Tests: No results for input(s): TSH, T4TOTAL, FREET4, T3FREE, THYROIDAB in the last 72 hours. Anemia Panel: No results for input(s): VITAMINB12, FOLATE, FERRITIN, TIBC, IRON, RETICCTPCT in the last 72 hours. Sepsis Labs: No results for input(s): PROCALCITON, LATICACIDVEN in the last 168 hours.  Recent Results (from the past 240 hour(s))  SARS CORONAVIRUS 2 (TAT 6-24 HRS) Nasopharyngeal Nasopharyngeal Swab     Status: None   Collection Time: 01/11/19  3:32 PM   Specimen: Nasopharyngeal Swab  Result Value Ref Range Status   SARS  Coronavirus 2 NEGATIVE NEGATIVE Final    Comment: (NOTE) SARS-CoV-2 target nucleic acids are NOT DETECTED. The SARS-CoV-2 RNA is generally detectable in upper and lower respiratory specimens during the acute phase of infection. Negative results do not preclude SARS-CoV-2 infection, do not rule out co-infections with other pathogens, and should not be used as the sole basis for treatment or other patient management decisions. Negative results must be combined with clinical observations, patient history, and epidemiological information. The expected result is Negative. Fact Sheet for Patients: SugarRoll.be Fact Sheet for Healthcare Providers: https://www.woods-mathews.com/ This test is not yet approved or cleared by the [place] FDA and  has been authorized for detection and/or diagnosis of SARS-CoV-2 by FDA under an Emergency Use Authorization (EUA). This EUA will remain  in effect (meaning this test can be used) for the duration of the COVID-19 declaration under Section 56 4(b)(1) of the Act, 21 U.S.C. section 360bbb-3(b)(1), unless the  authorization is terminated or revoked sooner. Performed at [facility], [place] [address] [place], [place] [address]    Radiology Studies: CT BIOPSY  Result Date: 2019/02/04 INDICATION: 64 year old male with a history of multiple myeloma status post stem cell transplant and chemotherapy. He presents with pancytopenia and severe hypercalcemia. He requires bone marrow biopsy to evaluate for progression of disease. EXAM: CT GUIDED BONE MARROW ASPIRATION AND CORE BIOPSY Interventional Radiologist:  Criselda Peaches, MD MEDICATIONS: None. ANESTHESIA/SEDATION: Moderate (conscious) sedation was employed during this procedure. A total of 2 milligrams versed and 100 micrograms fentanyl were administered intravenously. The patient's level of consciousness and vital signs were monitored continuously by radiology nursing throughout the procedure under my direct supervision. Total monitored sedation time: 10 minutes FLUOROSCOPY TIME:  None COMPLICATIONS: None immediate. Estimated blood loss: <25 mL PROCEDURE: Informed written consent was obtained from the patient after a thorough discussion of the procedural risks, benefits and alternatives. All questions were addressed. Maximal Sterile Barrier Technique was utilized including caps, mask, sterile gowns, sterile gloves, sterile drape, hand hygiene and skin antiseptic. A timeout was performed prior to the initiation of the procedure. The patient was positioned prone and non-contrast localization CT was performed of the pelvis to demonstrate the iliac marrow spaces. Maximal barrier sterile technique utilized including caps, mask, sterile gowns, sterile gloves, large sterile drape, hand hygiene, and betadine prep. Under sterile conditions and local anesthesia, an 11 gauge coaxial bone biopsy needle was advanced into the right iliac marrow space. Needle position was confirmed with CT imaging. Initially, bone marrow aspiration was performed. Next, the 11 gauge outer  cannula was utilized to obtain a right iliac bone marrow core biopsy. Needle was removed. Hemostasis was obtained with compression. The patient tolerated the procedure well. Samples were prepared with the cytotechnologist. IMPRESSION: Technically successful CT-guided bone marrow biopsy and aspiration. Signed, Criselda Peaches, MD, [facility] Radiology Electronically Signed   By: Jacqulynn Cadet M.D.   On: 02-04-19 10:58   DG Swallowing Func-Speech Pathology  Result Date: 02/04/2019 Objective Swallowing Evaluation: Type of Study: Bedside Swallow Evaluation  Patient Details Name: EVERARD INTERRANTE MRN: [id number] Date of Birth: 1955-07-02 Today's Date: 02/04/19 Time: SLP Start Time (ACUTE ONLY): 1305 -SLP Stop Time (ACUTE ONLY): 1335 SLP Time Calculation (min) (ACUTE ONLY): 30 min Past Medical History: Past Medical History: Diagnosis Date . Arthritis  . Cancer ([place])   multiple myeloma . Cervical stenosis of spinal canal  . Wears glasses  Past Surgical History: Past Surgical History: Procedure Laterality  Date . ANTERIOR CERVICAL CORPECTOMY N/A 11/04/2018  Procedure: Cervical three Corpectomy with Cervical two to Cervical four Plating;  Surgeon: Erline Levine, MD;  Location: [place];  Service: Neurosurgery;  Laterality: N/A; . APPENDECTOMY   . BACK SURGERY   . EYE SURGERY Bilateral   cataract surgery with lens implants . HERNIA REPAIR Left   inguinal  . IR IMAGING GUIDED PORT INSERTION  06/21/2018 . ROTATOR CUFF REPAIR Left  HPI: Patient is a 63 y.o. male with PMH: multiple myeloma, arthritis, cervical stenosis, who presented to hospital with nosebleed which was not able to be controlled at home despite attempts with pressure, paper towels and ice. He had similar episodes two weeks prior where bleeding lasted 8 hours before it finally stopped. Patient has had poor appetite for past couple weeks. Wife reported he seemed more confused in AM of admission. on  12/11, RN reported patient with significant difficulty with swallowing and unable to take medications and so SLP BSE ordered.  Pt underwent BSE on 01/14/2019 and was found to be too high risk for aspiration with recommendation for npo.  Subjective: pt awake in chair in flouro suite Assessment / Plan / Recommendation CHL IP CLINICAL IMPRESSIONS 01/16/2019 Clinical Impression Pt presents with minimal oropharyngeal dysphagia without aspiration or laryngeal penetration of any consistency tested.  In general, pt's swallow is strong and timely with adequate airway protection even with sequential liquid bolus swallows.  He did demonstrate difficulty orally transiting tablet with thin barium - and after 3 unsuccesful attempts, pudding effective to transit.  In addition, decreased UES relaxation noted when swallowing masticated cracker which results in residuals without pt awareness.  Liquid swallow faciliated clearance but also resulted in trace backflow, thus recommend strict esophageal precautions.  Given pt's generalized weakness and decreased UES clearance with solids (not sensate) - dys3 diet advised at this time.  Medicine with puree- whole - starting and following with liquids.  Of note, pt did NOT cough during entire MBS - symptoms of cough with liquids reported prior to admission and during hospitalization.  SLP educated pt and his wife (present for test) to findings/recommendations.  Will follow up for dysphagia management/treatment. Anticipate pt will benefit from dysphagia treatment given his XRT to pharyngeal region. SLP Visit Diagnosis Dysphagia, oropharyngeal phase (R13.12) Attention and concentration deficit following -- Frontal lobe and executive function deficit following -- Impact on safety and function Mild aspiration risk   CHL IP TREATMENT RECOMMENDATION 01/16/2019 Treatment Recommendations Therapy as outlined in treatment plan below   Prognosis 01/16/2019 Prognosis for Safe Diet Advancement Good  Barriers to Reach Goals -- Barriers/Prognosis Comment -- CHL IP DIET RECOMMENDATION 01/16/2019 SLP Diet Recommendations Dysphagia 3 (Mech soft) solids;Thin liquid Liquid Administration via Cup;Straw Medication Administration Whole meds with puree Compensations Small sips/bites;Slow rate;Effortful swallow;Minimize environmental distractions Postural Changes --   CHL IP OTHER RECOMMENDATIONS 01/16/2019 Recommended Consults -- Oral Care Recommendations Oral care BID Other Recommendations Clarify dietary restrictions   CHL IP FOLLOW UP RECOMMENDATIONS 01/16/2019 Follow up Recommendations Skilled Nursing facility   [facility] IP FREQUENCY AND DURATION 01/16/2019 Speech Therapy Frequency (ACUTE ONLY) min 2x/week Treatment Duration 2 weeks      CHL IP ORAL PHASE 01/16/2019 Oral Phase Impaired Oral - Pudding Teaspoon -- Oral - Pudding Cup -- Oral - Honey Teaspoon -- Oral - Honey Cup -- Oral - Nectar Teaspoon -- Oral - Nectar Cup WFL Oral - Nectar Straw -- Oral - Thin Teaspoon -- Oral - Thin Cup WFL Oral - Thin Straw [facility]  Oral - Puree WFL Oral - Mech Soft WFL Oral - Regular -- Oral - Multi-Consistency -- Oral - Pill Reduced posterior propulsion;Weak lingual manipulation Oral Phase - Comment pt needed pudding to orally transit tablet after unable x3 with liquids  CHL IP PHARYNGEAL PHASE 01/16/2019 Pharyngeal Phase WFL;Impaired Pharyngeal- Pudding Teaspoon -- Pharyngeal -- Pharyngeal- Pudding Cup -- Pharyngeal -- Pharyngeal- Honey Teaspoon -- Pharyngeal -- Pharyngeal- Honey Cup -- Pharyngeal -- Pharyngeal- Nectar Teaspoon -- Pharyngeal -- Pharyngeal- Nectar Cup [facility] Pharyngeal Material does not enter airway Pharyngeal- Nectar Straw -- Pharyngeal -- Pharyngeal- Thin Teaspoon -- Pharyngeal -- Pharyngeal- Thin Cup [facility] Pharyngeal Material does not enter airway Pharyngeal- Thin Straw WFL Pharyngeal Material does not enter airway Pharyngeal- Puree WFL Pharyngeal Material does not enter airway Pharyngeal- Mechanical Soft Pharyngeal residue -  cp segment Pharyngeal Material does not enter airway Pharyngeal- Regular NT Pharyngeal -- Pharyngeal- Multi-consistency -- Pharyngeal -- Pharyngeal- Pill WFL Pharyngeal Material does not enter airway Pharyngeal Comment --  CHL IP CERVICAL ESOPHAGEAL PHASE 01/16/2019 Cervical Esophageal Phase Impaired Pudding Teaspoon -- Pudding Cup -- Honey Teaspoon -- Honey Cup -- Nectar Teaspoon -- Nectar Cup WFL Nectar Straw -- Thin Teaspoon -- Thin Cup WFL Thin Straw Reduced cricopharyngeal relaxation;Esophageal backflow into cervical esophagus Puree WFL Mechanical Soft Reduced cricopharyngeal relaxation;Esophageal backflow into cervical esophagus Regular -- Multi-consistency -- Pill WFL Cervical Esophageal Comment pt without sensation to minimal residuals at pyriform/above UES region, liquid assisted clearance but also resulted in trace backflow, barium tablet swallowed with pudding appeared to transit into sttomach without delay Kathleen Lime, MS [facility] SLP Acute Rehab Services Office [phone] [name] 01/16/2019, 2:19 PM              CT BONE MARROW BIOPSY & ASPIRATION  Result Date: 01/16/2019 INDICATION: 63 year old male with a history of multiple myeloma status post stem cell transplant and chemotherapy. He presents with pancytopenia and severe hypercalcemia. He requires bone marrow biopsy to evaluate for progression of disease. EXAM: CT GUIDED BONE MARROW ASPIRATION AND CORE BIOPSY Interventional Radiologist:  Criselda Peaches, MD MEDICATIONS: None. ANESTHESIA/SEDATION: Moderate (conscious) sedation was employed during this procedure. A total of 2 milligrams versed and 100 micrograms fentanyl were administered intravenously. The patient's level of consciousness and vital signs were monitored continuously by radiology nursing throughout the procedure under my direct supervision. Total monitored sedation time: 10 minutes FLUOROSCOPY TIME:  None COMPLICATIONS: None immediate. Estimated blood loss: <25 mL  PROCEDURE: Informed written consent was obtained from the patient after a thorough discussion of the procedural risks, benefits and alternatives. All questions were addressed. Maximal Sterile Barrier Technique was utilized including caps, mask, sterile gowns, sterile gloves, sterile drape, hand hygiene and skin antiseptic. A timeout was performed prior to the initiation of the procedure. The patient was positioned prone and non-contrast localization CT was performed of the pelvis to demonstrate the iliac marrow spaces. Maximal barrier sterile technique utilized including caps, mask, sterile gowns, sterile gloves, large sterile drape, hand hygiene, and betadine prep. Under sterile conditions and local anesthesia, an 11 gauge coaxial bone biopsy needle was advanced into the right iliac marrow space. Needle position was confirmed with CT imaging. Initially, bone marrow aspiration was performed. Next, the 11 gauge outer cannula was utilized to obtain a right iliac bone marrow core biopsy. Needle was removed. Hemostasis was obtained with compression. The patient tolerated the procedure well. Samples were prepared with the cytotechnologist. IMPRESSION: Technically successful CT-guided bone marrow biopsy and aspiration. Signed, Criselda Peaches, MD, [place]  [facility] Radiology Electronically Signed   By: Jacqulynn Cadet M.D.   On: 01/16/2019 10:58   Scheduled Meds: . sodium chloride   Intravenous Once  . sodium chloride   Intravenous Once  . sodium chloride   Intravenous Once  . Chlorhexidine Gluconate Cloth  6 each Topical Daily  . docusate sodium  100 mg Oral BID  . dronabinol  10 mg Oral BID  . feeding supplement  1 Container Oral TID BM  . feeding supplement (PRO-STAT SUGAR FREE 64)  30 mL Oral BID  . gabapentin  300 mg Oral TID  . morphine  30 mg Oral Q12H  . oxymetazoline  1 spray Each Nare BID  . senna-docusate  1 tablet Oral Daily  . sucralfate  1  g Oral TID AC & HS  . tranexamic acid  500 mg Topical Once   Continuous Infusions: . sodium chloride 125 mL/hr at 01/17/19 0532     LOS: 5 days    Time spent:    Vicenta Dunning, MD [facility]   If 7PM-7AM, please contact night-coverage

## 2019-01-17 NOTE — Progress Notes (Signed)
[facility] Progress Note  Subjective:  He had 3.1 liters UOP over 12/14.  Feels ok - has been on NS at 125 cc/hr.  Family is at bedside.  States mental status has continued to improve  Review of systems  Denies shortness of breath or chest pain Denies n/v States eating ok - about to have dinner    Vitals:   01/16/19 2031 01/17/19 0500 01/17/19 0524 01/17/19 1815  BP: 118/71  109/71 107/66  Pulse: 97  91 89  Resp: 17  17   Temp: 98.1 F (36.7 C)  98.2 F (36.8 C) 97.9 F (36.6 C)  TempSrc: Oral  Oral Oral  SpO2: 98%  100% 100%  Weight:  49.5 kg    Height:        Exam: Gen frail adult male in bed in NAD; whispers   HEENT NCAT Sclera anicteric Neck supple No jvd  Chest clear bilat to bases; no crackles or wheezing; unlabored on room air  Heart RRR no Rubs  Abd soft ntnd Psych calm mood and affect Ext no LE or UE edema appreciated; no cyanosis or clubbing Neuro alert and oriented and answers questions     UA - clear 6-10 rbc, 0-5 wbc, neg protein  Assessment/ Plan: 1. Hypercalcemia - due to myeloma bony infiltration most likely.  Pancytopenia supports this possibility as well - Improving with NS  - s/p calcitonin x 4 doses per charting  - pt's MS appears to be improving - s/p 4mg  of zoledronic acid - treatment of cancer , pt getting IV decadron per oncology  2. Hypernatremia -resolved and back on normal saline  3. AMS -felt secondary in part to hypercalcemia; improving.  Would use caution with any sedating/pain meds - noted has gabapentin and narcotics. 4. Mult myeloma - onset in 2018, per ONC 5. AKI - mild and resolving with hydration; Cr of 0.6 at baseline  6. SP recent Cspine surgery 7. Pancytopenia -per ONC 8. Hypokalemia - received additional 40 meq potassium. magnesium improved   9. hypophos - resolved with repletion     Inpatient medications: . sodium chloride   Intravenous Once  . sodium chloride   Intravenous Once  . sodium  chloride   Intravenous Once  . Chlorhexidine Gluconate Cloth  6 each Topical Daily  . docusate sodium  100 mg Oral BID  . dronabinol  10 mg Oral BID  . feeding supplement  1 Container Oral TID BM  . feeding supplement (PRO-STAT SUGAR FREE 64)  30 mL Oral BID  . gabapentin  300 mg Oral TID  . morphine  30 mg Oral Q12H  . oxymetazoline  1 spray Each Nare BID  . potassium chloride  40 mEq Oral Daily  . senna-docusate  1 tablet Oral Daily  . sucralfate  1 g Oral TID AC & HS  . tranexamic acid  500 mg Topical Once   . sodium chloride 125 mL/hr at 01/17/19 0532   acetaminophen, bisacodyl, methocarbamol, morphine injection, ondansetron, oxyCODONE, senna-docusate, sodium chloride flush   Claudia Desanctis 01/17/2019 6:29 PM

## 2019-01-18 ENCOUNTER — Encounter (HOSPITAL_COMMUNITY): Payer: Self-pay | Admitting: Hematology

## 2019-01-18 DIAGNOSIS — K5900 Constipation, unspecified: Secondary | ICD-10-CM

## 2019-01-18 DIAGNOSIS — C9002 Multiple myeloma in relapse: Secondary | ICD-10-CM

## 2019-01-18 LAB — BASIC METABOLIC PANEL
Anion gap: 4 — ABNORMAL LOW (ref 5–15)
Anion gap: 5 (ref 5–15)
BUN: 18 mg/dL (ref 8–23)
BUN: 14 mg/dL (ref 8–23)
CO2: 26 mmol/L (ref 22–32)
CO2: 25 mmol/L (ref 22–32)
Calcium: 12.5 mg/dL — ABNORMAL HIGH (ref 8.9–10.3)
Calcium: 12.5 mg/dL — ABNORMAL HIGH (ref 8.9–10.3)
Chloride: 117 mmol/L — ABNORMAL HIGH (ref 98–111)
Chloride: 112 mmol/L — ABNORMAL HIGH (ref 98–111)
Creatinine, Ser: 0.93 mg/dL (ref 0.61–1.24)
Creatinine, Ser: 0.84 mg/dL (ref 0.61–1.24)
GFR calc Af Amer: >60 mL/min (ref >60)
GFR calc Af Amer: >60 mL/min (ref >60)
GFR calc non Af Amer: >60 mL/min (ref >60)
GFR calc non Af Amer: >60 mL/min (ref >60)
Glucose, Bld: 104 mg/dL — ABNORMAL HIGH (ref 70–99)
Glucose, Bld: 125 mg/dL — ABNORMAL HIGH (ref 70–99)
Potassium: 3.6 mmol/L (ref 3.5–5.1)
Potassium: 3.5 mmol/L (ref 3.5–5.1)
Sodium: 147 mmol/L — ABNORMAL HIGH (ref 135–145)
Sodium: 142 mmol/L (ref 135–145)

## 2019-01-18 LAB — RENAL FUNCTION PANEL
Albumin: 2.7 g/dL — ABNORMAL LOW (ref 3.5–5.0)
Anion gap: 5 (ref 5–15)
BUN: 18 mg/dL (ref 8–23)
CO2: 26 mmol/L (ref 22–32)
Calcium: 12.5 mg/dL — ABNORMAL HIGH (ref 8.9–10.3)
Chloride: 116 mmol/L — ABNORMAL HIGH (ref 98–111)
Creatinine, Ser: 0.9 mg/dL (ref 0.61–1.24)
GFR calc Af Amer: >60 mL/min (ref >60)
GFR calc non Af Amer: >60 mL/min (ref >60)
Glucose, Bld: 103 mg/dL — ABNORMAL HIGH (ref 70–99)
Phosphorus: 4.1 mg/dL (ref 2.5–4.6)
Potassium: 3.6 mmol/L (ref 3.5–5.1)
Sodium: 147 mmol/L — ABNORMAL HIGH (ref 135–145)

## 2019-01-18 LAB — CBC
HCT: 25.6 % — ABNORMAL LOW (ref 39.0–52.0)
Hemoglobin: 8.3 g/dL — ABNORMAL LOW (ref 13.0–17.0)
MCH: 28.8 pg (ref 26.0–34.0)
MCHC: 32.4 g/dL (ref 30.0–36.0)
MCV: 88.9 fL (ref 80.0–100.0)
Platelets: 48 10*3/uL — ABNORMAL LOW (ref 150–400)
RBC: 2.88 MIL/uL — ABNORMAL LOW (ref 4.22–5.81)
RDW: 19.9 % — ABNORMAL HIGH (ref 11.5–15.5)
WBC: 1 10*3/uL — CL (ref 4.0–10.5)
nRBC: 0 % (ref 0.0–0.2)

## 2019-01-18 LAB — MAGNESIUM: Magnesium: 1.8 mg/dL (ref 1.7–2.4)

## 2019-01-18 LAB — SURGICAL PATHOLOGY

## 2019-01-18 MED ORDER — DEXAMETHASONE SODIUM PHOSPHATE 4 MG/ML IJ SOLN
12.0000 mg | Freq: Once | INTRAMUSCULAR | Status: AC
  Administered 2019-01-18: 12 mg via INTRAVENOUS
  Filled 2019-01-18: qty 3

## 2019-01-18 MED ORDER — DOCUSATE SODIUM 50 MG/5ML PO LIQD
50.0000 mg | Freq: Two times a day (BID) | ORAL | Status: DC | PRN
  Filled 2019-01-18: qty 10

## 2019-01-18 MED ORDER — SODIUM CHLORIDE 0.45 % IV SOLN: INTRAVENOUS | Status: DC

## 2019-01-18 MED ORDER — CALCITONIN (SALMON) 200 UNIT/ML IJ SOLN
4.0000 [IU]/kg | Freq: Once | INTRAMUSCULAR | Status: AC
  Administered 2019-01-18: 198 [IU] via INTRAMUSCULAR
  Filled 2019-01-18: qty 0.99

## 2019-01-18 MED ORDER — POTASSIUM CHLORIDE 20 MEQ PO PACK
40.0000 meq | PACK | Freq: Once | ORAL | Status: AC
  Administered 2019-01-18: 40 meq via ORAL
  Filled 2019-01-18: qty 2

## 2019-01-18 MED ORDER — CALCITONIN (SALMON) 200 UNIT/ML IJ SOLN: 4.0000 [IU]/kg | Freq: Two times a day (BID) | INTRAMUSCULAR | Status: DC

## 2019-01-18 NOTE — Progress Notes (Addendum)
[name]   HEMATOLOGY/ONCOLOGY INPATIENT PROGRESS NOTE  Date of Service: 01/18/2019  Inpatient Attending: .Vicenta Dunning, MD   SUBJECTIVE  Patient more awake and interactive. No longer having epistaxis or any other bleeding.  He is not complaining of any pain at this time.  Labs from today have been reviewed which show a white blood cell count of 1.0, hemoglobin 8.3, and platelet count of 48,000.  His calcium level has increased to 12.5 with an albumin of 2.7-corrected calcium is 13.1. 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     OBJECTIVE:  PHYSICAL EXAMINATION: . Vitals:   01/17/19 0524 01/17/19 1815 01/17/19 2056 01/18/19 0509  BP: 109/71 107/66 109/70 130/72  Pulse: 91 89 88 (!) 101  Resp: _0 Temp: 98.2 F (36.8 C) 97.9 F (36.6 C) 98.6 F (37 C) (!) 97.5 F (36.4 C)  TempSrc: Oral Oral Oral Oral  SpO2: 100% 100% 97% 100%  Weight:      Height:       Filed Weights   01/14/19 0415 01/15/19 0524 01/17/19 0500  Weight: 113 lb 1.5 oz (51.3 kg) 109 lb 9.1 oz (49.7 kg) 109 lb 2 oz (49.5 kg)   .Body mass index is 17.09 kg/m.  GENERAL:alert, resolved epistaxis.  Looks comfortable SKIN: No rashes or petechiae EYES: Sunken eyes OROPHARYNX: Dry mucous membranes  NECK: supple, LYMPH:  no palpable lymphadenopathy in the cervical, axillary or inguinal LUNGS: clear to auscultation with normal respiratory effort HEART: regular rate & rhythm,  no murmurs and no lower extremity edema ABDOMEN: abdomen soft, non-tender, normoactive bowel sounds  Musculoskeletal: no cyanosis of digits and no clubbing  PSYCH: alert & somewhat confused and a little sleepy. NEURO: no focal motor/sensory deficits.  Minimally conversant -received mild sedation during bone marrow biopsy procedure   MEDICAL HISTORY:  Past Medical History:  Diagnosis Date   Arthritis    Cancer ([place])    multiple myeloma   Cervical stenosis of spinal canal     Wears glasses     SURGICAL HISTORY: Past Surgical History:  Procedure Laterality Date   ANTERIOR CERVICAL CORPECTOMY N/A 11/04/2018   Procedure: Cervical three Corpectomy with Cervical two to Cervical four Plating;  Surgeon: Erline Levine, MD;  Location: [facility];  Service: Neurosurgery;  Laterality: N/A;   APPENDECTOMY     BACK SURGERY     EYE SURGERY Bilateral    cataract surgery with lens implants   HERNIA REPAIR Left    inguinal    IR IMAGING GUIDED PORT INSERTION  06/21/2018   ROTATOR CUFF REPAIR Left     SOCIAL HISTORY: Social History   Socioeconomic History   Marital status: Married    Spouse name: Not on file   Number of children: Not on file   Years of education: Not on file   Highest education level: Not on file  Occupational History   Not on file  Tobacco Use   Smoking status: Never Smoker   Smokeless tobacco: Never Used  Substance and Sexual Activity   Alcohol use: Never   Drug use: Never   Sexual activity: Not on file  Other Topics Concern   Not on file  Social History Narrative   Not on file   Social Determinants of Health   Financial Resource Strain:    Difficulty of Paying Living Expenses: Not on file  Food Insecurity:    Worried About Running Out of Food in  the Last Year: Not on file   Ran Out of Food in the Last Year: Not on file  Transportation Needs: No Transportation Needs   Lack of Transportation (Medical): No   Lack of Transportation (Non-Medical): No  Physical Activity:    Days of Exercise per Week: Not on file   Minutes of Exercise per Session: Not on file  Stress:    Feeling of Stress : Not on file  Social Connections:    Frequency of Communication with Friends and Family: Not on file   Frequency of Social Gatherings with Friends and Family: Not on file   Attends Religious Services: Not on file   Active Member of Clubs or Organizations: Not on file   Attends Archivist Meetings: Not on file    Marital Status: Not on file  Intimate Partner Violence:    Fear of Current or Ex-Partner: Not on file   Emotionally Abused: Not on file   Physically Abused: Not on file   Sexually Abused: Not on file    FAMILY HISTORY: Family History  Problem Relation Age of Onset   Breast cancer Sister     ALLERGIES:  is allergic to penicillins.  MEDICATIONS:  Scheduled Meds:  sodium chloride   Intravenous Once   sodium chloride   Intravenous Once   sodium chloride   Intravenous Once   Chlorhexidine Gluconate Cloth  6 each Topical Daily   dexamethasone  12 mg Intravenous Once   docusate sodium  100 mg Oral BID   dronabinol  10 mg Oral BID   feeding supplement  1 Container Oral TID BM   feeding supplement (PRO-STAT SUGAR FREE 64)  30 mL Oral BID   gabapentin  300 mg Oral TID   morphine  30 mg Oral Q12H   oxymetazoline  1 spray Each Nare BID   senna-docusate  1 tablet Oral Daily   sucralfate  1 g Oral TID AC & HS   tranexamic acid  500 mg Topical Once   Continuous Infusions:  sodium chloride 125 mL/hr at 01/18/19 1042   PRN Meds:.  REVIEW OF SYSTEMS:    10 Point review of Systems was done is negative except as noted above.   LABORATORY DATA:  I have reviewed the data as listed  . CBC Latest Ref Rng & Units 01/18/2019 01/17/2019 01/16/2019  WBC 4.0 - 10.5 K/uL 1.0(LL) 1.1(LL) 1.4(LL)  Hemoglobin 13.0 - 17.0 g/dL 8.3(L) 7.8(L) 8.7(L)  Hematocrit 39.0 - 52.0 % 25.6(L) 24.1(L) 26.4(L)  Platelets 150 - 400 K/uL 48(L) 61(L) 10(LL)    . CMP Latest Ref Rng & Units 01/18/2019 01/18/2019 01/17/2019  Glucose 70 - 99 mg/dL 104(H) 103(H) 101(H)  BUN 8 - 23 mg/dL _0 Creatinine 0.61 - 1.24 mg/dL 0.93 0.90 0.93  Sodium 135 - 145 mmol/L 147(H) 147(H) 138  Potassium 3.5 - 5.1 mmol/L 3.6 3.6 3.4(L)  Chloride 98 - 111 mmol/L 117(H) 116(H) 108  CO2 22 - 32 mmol/L _1 Calcium 8.9 - 10.3 mg/dL 12.5(H) 12.5(H) 11.0(H)  Total Protein 6.5 - 8.1 g/dL - - -    Total Bilirubin 0.3 - 1.2 mg/dL - - -  Alkaline Phos 38 - 126 U/L - - -  AST 15 - 41 U/L - - -  ALT 0 - 44 U/L - - -     RADIOGRAPHIC STUDIES: I have personally reviewed the radiological images as listed and agreed with the findings in the report. NM Bone Scan  Whole Body  Result Date: 01/12/2019 CLINICAL DATA:  History of multiple myeloma with acute back pain. EXAM: NUCLEAR MEDICINE WHOLE BODY BONE SCAN TECHNIQUE: Whole body anterior and posterior images were obtained approximately 3 hours after intravenous injection of radiopharmaceutical. RADIOPHARMACEUTICALS:  21.8 mCi Technetium-27mMDP IV COMPARISON:  PET-CT 10/13/2018 FINDINGS: Examination demonstrates patchy increased uptake over the mid to lower thoracic spine over known areas of fusion hardware and bone destruction representing patient's known multiple myeloma. Mild uptake over the right acetabulum likely representing patient's multiple myeloma. Minimal hazy uptake on the anterior image at the cervicothoracic junction which may represent patient's known myelomatous involvement of the cervical spine. Minimal patchy uptake over several anterior upper ribs bilaterally which may be due to patient's myomatous disease. Minimal degenerative changes over the shoulders and knees. IMPRESSION: 1. Uptake over the right acetabulum, mid to lower thoracic spine, upper anterior ribs and possibly cervicothoracic junction likely representing patient's known multiple myeloma. 2.  Degenerative changes as described. Electronically Signed   By: DMarin OlpM.D.   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 He presents with pancytopenia and severe hypercalcemia. He requires bone marrow biopsy to evaluate for progression of disease. EXAM: CT GUIDED BONE MARROW ASPIRATION AND CORE BIOPSY Interventional Radiologist:  HCriselda Peaches MD MEDICATIONS: None. ANESTHESIA/SEDATION: Moderate (conscious) sedation was employed during this procedure. A total of 2 milligrams versed and 100 micrograms fentanyl were administered intravenously. The patient's level of consciousness and vital signs were monitored continuously by radiology nursing throughout the procedure under my direct supervision. Total monitored sedation time: 10 minutes FLUOROSCOPY TIME:  None COMPLICATIONS: None immediate. Estimated blood loss: <25 mL PROCEDURE: Informed written consent was obtained from the patient after a thorough discussion of the procedural risks, benefits and alternatives. All questions were addressed. Maximal Sterile Barrier Technique was utilized including caps, mask, sterile gowns, sterile gloves, sterile drape, hand hygiene and skin antiseptic. A timeout was performed prior to the initiation of the procedure. The patient was positioned prone and non-contrast localization CT was performed of the pelvis to demonstrate the iliac marrow spaces. Maximal barrier sterile technique utilized including caps, mask, sterile gowns, sterile gloves, large sterile drape, hand hygiene, and betadine prep. Under sterile conditions and local anesthesia, an 11 gauge coaxial bone biopsy needle was advanced into the right iliac marrow space. Needle position was confirmed with CT imaging. Initially, bone marrow aspiration was performed. Next, the 11 gauge outer cannula was utilized to obtain a right iliac bone marrow core biopsy. Needle was removed. Hemostasis was obtained with compression. The patient tolerated the procedure well. Samples were prepared with the cytotechnologist. IMPRESSION: Technically successful CT-guided bone marrow biopsy and aspiration. Signed, HCriselda Peaches MD, RDuggerVascular and Interventional Radiology Specialists [facility] Electronically Signed   By: HJacqulynn CadetM.D.   On: 01/16/2019 10:58   DG Swallowing Func-Speech  Pathology  Result Date: 01/16/2019 Objective Swallowing Evaluation: Type of Study: Bedside Swallow Evaluation  Patient Details Name: WFLORIAN CHAUCAMRN: [id number] of Birth: [birth year]Today's Date: 01/16/2019 Time: SLP Start Time (ACUTE ONLY): 1305 -SLP Stop Time (ACUTE ONLY): 1335 SLP Time Calculation (min) (ACUTE ONLY): 30 min Past Medical History: Past Medical History: Diagnosis Date  Arthritis   [place]   [place]oma  Cervical stenosis of spinal canal   Wears glasses  Past Surgical History: Past Surgical History: Procedure Laterality  Date  ANTERIOR CERVICAL CORPECTOMY N/A 11/04/2018  Procedure: Cervical three Corpectomy with Cervical two to Cervical four Plating;  Surgeon: Erline Levine, MD;  Location: [facility];  Service: Neurosurgery;  Laterality: N/A;  APPENDECTOMY    BACK SURGERY    EYE SURGERY Bilateral   cataract surgery with lens implants  HERNIA REPAIR Left   inguinal   IR IMAGING GUIDED PORT INSERTION  06/21/2018  ROTATOR CUFF REPAIR Left  HPI: Patient is a 63 y.o. male with PMH: multiple myeloma, arthritis, cervical stenosis, who presented to hospital with nosebleed which was not able to be controlled at home despite attempts with pressure, paper towels and ice. He had similar episodes two weeks prior where bleeding lasted 8 hours before it finally stopped. Patient has had poor appetite for past couple weeks. Wife reported he seemed more confused in AM of admission. on 12/11, RN reported patient with significant difficulty with swallowing and unable to take medications and so SLP BSE ordered.  Pt underwent BSE on 01/14/2019 and was found to be too high risk for aspiration with recommendation for npo.  Subjective: pt awake in chair in flouro suite Assessment / Plan / Recommendation CHL IP CLINICAL IMPRESSIONS 01/16/2019 Clinical Impression Pt presents with minimal oropharyngeal dysphagia without aspiration or laryngeal penetration of any consistency tested.  In general, pt's  swallow is strong and timely with adequate airway protection even with sequential liquid bolus swallows.  He did demonstrate difficulty orally transiting tablet with thin barium - and after 3 unsuccesful attempts, pudding effective to transit.  In addition, decreased UES relaxation noted when swallowing masticated cracker which results in residuals without pt awareness.  Liquid swallow faciliated clearance but also resulted in trace backflow, thus recommend strict esophageal precautions.  Given pt's generalized weakness and decreased UES clearance with solids (not sensate) - dys3 diet advised at this time.  Medicine with puree- whole - starting and following with liquids.  Of note, pt did NOT cough during entire MBS - symptoms of cough with liquids reported prior to admission and during hospitalization.  SLP educated pt and his wife (present for test) to findings/recommendations.  Will follow up for dysphagia management/treatment. Anticipate pt will benefit from dysphagia treatment given his XRT to pharyngeal region. SLP Visit Diagnosis Dysphagia, oropharyngeal phase (R13.12) Attention and concentration deficit following -- Frontal lobe and executive function deficit following -- Impact on safety and function Mild aspiration risk   CHL IP TREATMENT RECOMMENDATION 01/16/2019 Treatment Recommendations Therapy as outlined in treatment plan below   Prognosis 01/16/2019 Prognosis for Safe Diet Advancement Good Barriers to Reach Goals -- Barriers/Prognosis Comment -- CHL IP DIET RECOMMENDATION 01/16/2019 SLP Diet Recommendations Dysphagia 3 (Mech soft) solids;Thin liquid Liquid Administration via Cup;Straw Medication Administration Whole meds with puree Compensations Small sips/bites;Slow rate;Effortful swallow;Minimize environmental distractions Postural Changes --   CHL IP OTHER RECOMMENDATIONS 01/16/2019 Recommended Consults -- Oral Care Recommendations Oral care BID Other Recommendations Clarify dietary restrictions    CHL IP FOLLOW UP RECOMMENDATIONS 01/16/2019 Follow up Recommendations Skilled Nursing facility   [facility] IP FREQUENCY AND DURATION 01/16/2019 Speech Therapy Frequency (ACUTE ONLY) min 2x/week Treatment Duration 2 weeks      CHL IP ORAL PHASE 01/16/2019 Oral Phase Impaired Oral - Pudding Teaspoon -- Oral - Pudding Cup -- Oral - Honey Teaspoon -- Oral - Honey Cup -- Oral - Nectar Teaspoon -- Oral - Nectar Cup WFL Oral - Nectar Straw -- Oral - Thin Teaspoon -- Oral - Thin Cup WFL Oral - Thin Straw [facility]  Oral - Puree WFL Oral - Mech Soft WFL Oral - Regular -- Oral - Multi-Consistency -- Oral - Pill Reduced posterior propulsion;Weak lingual manipulation Oral Phase - Comment pt needed pudding to orally transit tablet after unable x3 with liquids  CHL IP PHARYNGEAL PHASE 01/16/2019 Pharyngeal Phase WFL;Impaired Pharyngeal- Pudding Teaspoon -- Pharyngeal -- Pharyngeal- Pudding Cup -- Pharyngeal -- Pharyngeal- Honey Teaspoon -- Pharyngeal -- Pharyngeal- Honey Cup -- Pharyngeal -- Pharyngeal- Nectar Teaspoon -- Pharyngeal -- Pharyngeal- Nectar Cup [facility] Pharyngeal Material does not enter airway Pharyngeal- Nectar Straw -- Pharyngeal -- Pharyngeal- Thin Teaspoon -- Pharyngeal -- Pharyngeal- Thin Cup [facility] Pharyngeal Material does not enter airway Pharyngeal- Thin Straw WFL Pharyngeal Material does not enter airway Pharyngeal- Puree WFL Pharyngeal Material does not enter airway Pharyngeal- Mechanical Soft Pharyngeal residue - cp segment Pharyngeal Material does not enter airway Pharyngeal- Regular NT Pharyngeal -- Pharyngeal- Multi-consistency -- Pharyngeal -- Pharyngeal- Pill WFL Pharyngeal Material does not enter airway Pharyngeal Comment --  CHL IP CERVICAL ESOPHAGEAL PHASE 01/16/2019 Cervical Esophageal Phase Impaired Pudding Teaspoon -- Pudding Cup -- Honey Teaspoon -- Honey Cup -- Nectar Teaspoon -- Nectar Cup WFL Nectar Straw -- Thin Teaspoon -- Thin Cup WFL Thin Straw Reduced cricopharyngeal relaxation;Esophageal backflow into  cervical esophagus Puree WFL Mechanical Soft Reduced cricopharyngeal relaxation;Esophageal backflow into cervical esophagus Regular -- Multi-consistency -- Pill WFL Cervical Esophageal Comment pt without sensation to minimal residuals at pyriform/above UES region, liquid assisted clearance but also resulted in trace backflow, barium tablet swallowed with pudding appeared to transit into sttomach without delay Kathleen Lime, MS [facility] SLP Acute Rehab Services Office [phone] [name] 01/16/2019, 2:19 PM              CT BONE MARROW BIOPSY & ASPIRATION  Result Date: 01/16/2019 INDICATION: 63 year old male with a history of multiple myeloma status post stem cell transplant and chemotherapy. He presents with pancytopenia and severe hypercalcemia. He requires bone marrow biopsy to evaluate for progression of disease. EXAM: CT GUIDED BONE MARROW ASPIRATION AND CORE BIOPSY Interventional Radiologist:  Criselda Peaches, MD MEDICATIONS: None. ANESTHESIA/SEDATION: Moderate (conscious) sedation was employed during this procedure. A total of 2 milligrams versed and 100 micrograms fentanyl were administered intravenously. The patient's level of consciousness and vital signs were monitored continuously by radiology nursing throughout the procedure under my direct supervision. Total monitored sedation time: 10 minutes FLUOROSCOPY TIME:  None COMPLICATIONS: None immediate. Estimated blood loss: <25 mL PROCEDURE: Informed written consent was obtained from the patient after a thorough discussion of the procedural risks, benefits and alternatives. All questions were addressed. Maximal Sterile Barrier Technique was utilized including caps, mask, sterile gowns, sterile gloves, sterile drape, hand hygiene and skin antiseptic. A timeout was performed prior to the initiation of the procedure. The patient was positioned prone and non-contrast localization CT was performed of the pelvis to demonstrate the iliac marrow spaces.  Maximal barrier sterile technique utilized including caps, mask, sterile gowns, sterile gloves, large sterile drape, hand hygiene, and betadine prep. Under sterile conditions and local anesthesia, an 11 gauge coaxial bone biopsy needle was advanced into the right iliac marrow space. Needle position was confirmed with CT imaging. Initially, bone marrow aspiration was performed. Next, the 11 gauge outer cannula was utilized to obtain a right iliac bone marrow core biopsy. Needle was removed. Hemostasis was obtained with compression. The patient tolerated the procedure well. Samples were prepared with the cytotechnologist. IMPRESSION: Technically successful CT-guided bone marrow biopsy and aspiration. Signed, Criselda Peaches, MD, [place]  [facility] Radiology Electronically Signed   By: Jacqulynn Cadet M.D.   On: 01/16/2019 10:58    ASSESSMENT & PLAN:   63 y.o. male with  1. 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 IgG at 2220. 08/11/16 PET/CT revealed Hypermetabolic large soft tissue mass in the lower thoracic paraspinal region associated with lytic destruction of T10 vertebral body, concerning for multiple myeloma. Additional smaller lytic lesions involving the skeleton. S/p RT x 4 fractions, discontinued due to T10 pathologic fracture with severe cord compression S/p 08/26/16 T10 corpectomy and T8-L1 posterior spinal fusion   Began 7 cycles of VRd on 09/24/16 S/p autologous stem cell transplant, Day 0 on 02/23/17 04/21/17 BM Bx with residual less than 5% lambda light chain restricted plasma cells in the bone marrow. M Spike at 0.4g Began maintenance 48m/m2 Carfilzomib every 2 weeks on 06/16/17  06/09/18 PET/CT revealed "Multifocal hypermetabolic lytic lesions in  the skeleton as noted above, compatible with active myeloma. In the spine, the most notable active lesion of concern is at the T12 level with there is a large right eccentric vertebral body lesion with demineralization of the cortex and likely some paravertebral extension of tumor. This could cause loosening of the right pedicle screw. Intraspinal extension of tumor is difficult to exclude. MRI might be considered although the posterolateral rod and pedicle screws may cause artifact. 2. There is likewise cortical breakthrough associated with the lytic lesion along the left upper acetabulum. 3. A lesion in the left intertrochanteric area of the femur is large enough to potentially cause biomechanical weakening which increases risk of fracture. 4. Other skeletal sites of active involvement are detailed above in the skeleton section. 5. Several lucent lesions are observed including the calvarium, T1 vertebral body, and left L4 pedicle which are not hypermetabolic and may represent effectively previously treated lesions. 6. 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 There is also a lipoma along the right brachialis muscle. 7. Nonspecific subcutaneous edema especially in the right forearm but also extending up into the right upper arm. Cause is uncertain. There is some low-grade nonfocal metabolic activity along this area. Strictly speaking, cellulitis is not excluded, correlate with clinical history and visual inspection. 8. Other imaging findings of potential clinical significance: Aortic Atherosclerosis. Mild cardiomegaly. Cholelithiasis. Prostatomegaly."  06/17/18 MMP revealed M Protein at 0.5g, just before beginning C1 Daratumumab  06/30/18 BM Biopsy revealed normocellular marrow with minimal involvement by plasma cell neoplasm (5% plasma cells), normal cytogenetics.  06/30/18 Molecular Cytogenetics did not have enough material for  testing.  10/13/2018 PET whole body revealed "1. Mixed response to therapy with some lesions decreased significantly in metabolic activity and others increased in metabolic activity as well size on the CT portion exam. 2. Expansion of lytic lesion at C3 with loss of a large portion of the vertebral body structure and intense metabolic activity. Recommend non emergent neuro surgical consultation for evaluation of this destructive C3 lesion. Dedicated CT or MRI of the neck may also provide more information. 3. Increase in size and metabolic activity of RIGHT iliac lesion and T5 vertebral body lesion. 4. Marked improvement with reduction in metabolic activity of RIGHT humerus and scapular metastatic lesions as well as marked reduction  in metabolic activity large LEFT iliac bone lesion. 5. No evidence of soft tissue plasmacytoma or metastasis."  #2 severe hypercalcemia likely due to progression of his multiple myeloma.  Mild confusion and significant dehydration.  #3 severe uncontrolled epistaxis likely from dry mucous membranes plus severe thrombocytopenia   PLAN: -Discussed lab work today with the patient and his wife who was at the bedside. - status post 2 units PRBCs on 01/12/2019 and now with stable hemoglobin 8.3 today.  No additional transfusion indicated today.  Continue to monitor CBC closely and transfuse for hemoglobin less than 8 or symptomatic. -Status post 1 unit of platelets on 01/11/2019 and another unit on 01/16/2019.  Platelet count is 48,000 today.  Transfuse platelets as needed to keep platelets more than 20k in the setting of severe epistaxis.  Platelet transfusion is not indicated today. 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  Bone marrow biopsy was performed earlier this week and results are still pending. We are hoping to get a preliminary result in the next 1 to 2 days.  We will have discussion of  possible treatment options and goals of care with the patient and his wife. -Continue aggressive IV hydration for his hypercalcemia. -He is status post dexamethasone 12 mg and Zometa to try to help control his hypercalcemia.  He also received 4 doses of calcitonin over the weekend.  Calcium is again trending upward.  Will administer dexamethasone 12 mg IV x1 dose today.  Correction of calcium will be dependent upon treating his underlying multiple myeloma if consistent with goals of care pending bone marrow biopsy results. -Bone scan shows active bone disease. -Patient has been following with Dr. Blinda Leatherwood at [facility] to determine clinical trial options. -We will discuss with him other salvage treatment options based on bone marrow biopsy results and goals of care.  Mikey Bussing, DNP, AGPCNP-BC, AOCNP  01/18/2019 11:50 AM    ADDENDUM  .Patient was Personally and independently interviewed, examined and relevant elements of the history of present illness were reviewed in details and an assessment and plan was created. All elements of the patient's history of present illness , assessment and plan were discussed in details with Mikey Bussing, DNP. The above documentation reflects our combined findings assessment and plan.  Sullivan Lone MD MS

## 2019-01-18 NOTE — Progress Notes (Signed)
[facility] Progress Note  Subjective:  He had 2.7 liters UOP over 12/15.  States he feels better than yesterday.  has been on 1/2 NS at 125 cc/hr - changed this AM due to sodium.  Updated family at bedside.   Review of systems  Denies shortness of breath or chest pain Denies n/v Eating dinner Urinating well - condom cath     Vitals:   01/17/19 1815 01/17/19 2056 01/18/19 0509 01/18/19 1451  BP: 107/66 109/70 130/72 (!) 145/86  Pulse: 89 88 (!) 101 92  Resp:  16 17 17   Temp: 97.9 F (36.6 C) 98.6 F (37 C) (!) 97.5 F (36.4 C) 97.6 F (36.4 C)  TempSrc: Oral Oral Oral Oral  SpO2: 100% 97% 100% 96%  Weight:      Height:        Exam: Gen frail adult male in bed in NAD; whispers but voice improving  HEENT NCAT Sclera anicteric Neck supple No jvd  Chest clear bilat to bases; no crackles or wheezing; unlabored on room air  Heart tachycardia no Rub  Abd soft ntnd Psych calm mood and affect Ext no LE or UE edema appreciated; no cyanosis or clubbing Neuro alert and oriented and answers questions GU condom cath     UA - clear 6-10 rbc, 0-5 wbc, neg protein  Assessment/ Plan: 1. Hypercalcemia - due to myeloma bony infiltration most likely.  Pancytopenia supports this possibility as well - Continue IV fluids - switched today to 1/2 NS given hypernatremia; inc rate to 150 ml/hr redose calcitonin x 1; s/p calcitonin last dose 12/14 at 10:52 am. Discussed with pharmacy - pt's MS appears to be improving - s/p 4mg  of zoledronic acid - treatment of cancer , pt getting IV decadron per oncology  2. Hypernatremia - improved on 1/2 NS; may be able to switch back to NS tomorrow per trends 3. AMS - felt secondary in part to hypercalcemia; improving.  Would use caution with any sedating/pain meds - noted has gabapentin and narcotics. 4. Mult myeloma - onset in 2018, per ONC 5. AKI - mild and resolving with hydration; Cr of 0.6 is his baseline  6. SP recent Cspine  surgery 7. Pancytopenia -per ONC 8. Hypokalemia - 40 meq potassium x 1. magnesium improved   9. hypophos - resolved with repletion     Inpatient medications: . sodium chloride   Intravenous Once  . sodium chloride   Intravenous Once  . sodium chloride   Intravenous Once  . calcitonin  4 Units/kg Intramuscular Once  . Chlorhexidine Gluconate Cloth  6 each Topical Daily  . docusate sodium  100 mg Oral BID  . dronabinol  10 mg Oral BID  . feeding supplement  1 Container Oral TID BM  . feeding supplement (PRO-STAT SUGAR FREE 64)  30 mL Oral BID  . gabapentin  300 mg Oral TID  . morphine  30 mg Oral Q12H  . oxymetazoline  1 spray Each Nare BID  . senna-docusate  1 tablet Oral Daily  . sucralfate  1 g Oral TID AC & HS  . tranexamic acid  500 mg Topical Once   . sodium chloride 125 mL/hr at 01/18/19 1500   acetaminophen, bisacodyl, docusate, methocarbamol, morphine injection, ondansetron, oxyCODONE, senna-docusate, sodium chloride flush   Claudia Desanctis 01/18/2019 6:44 PM

## 2019-01-18 NOTE — Progress Notes (Signed)
PROGRESS NOTE    Jesus [name]  PNT:[id number] DOB: 01-07-1956 DOA: 01/11/2019 PCP: Patient, No Pcp Per    Brief Narrative:  Patient is a 64 year old male with history of multiple myeloma, arthritis, cervical stenosis who presented with nosebleed.  He had a similar episode about 2 weeks ago which lasted for 8 hours but spontaneously resolved.  He follows with oncologist at [facility] to have severe thrombocytopenia at presentation and also has normocytic anemia.  Hematology/oncology consulted.  Nosebleed has stopped.  Being transfused today for anemia.  Also found to have hypercalcemia secondary to MM and was given a dose of Zometa and Decadron.  Assessment & Plan:   Active Problems:   Hypercalcemia   Epistaxis   Thrombocytopenia (HCC)   Anemia   Leukopenia   # Multiple myeloma: Follows with Dr Irene Limbo and also Dr. Blinda Leatherwood ,,oncology, at [facility].  Has history of recent C3 compression fracture status post corpectomy, C2-C4 plating.  Oncology following, suggest bone marrow biopsy by IR , completed 12/11, follow up results. Bone scan: Uptake over the right acetabulum, mid to lower thoracic spine, upper anterior ribs and possibly cervicothoracic junction likely representing patient's known multiple myeloma.  Patient has history of an aggressive 17P mutated multiple myeloma currently on daratumumab  Discussed with oncology nurse practitioner.  Awaiting bone marrow biopsy results.  Patient would likely benefit with palliative consultation, will defer to oncology.   Hypercalcemia: Secondary to malignancy.  Calcium level still elevated..  Continue aggressive  IV fluids.  Given Zometa and Decadron, calcitonin. Nephrology consulted IV fluid switched to 0.45 normal saline due to hyponatremia  Hypokalemia  - replete as needed  Hypernatremia: 0.45 ns  @ 125 cc/hr, recheck labs  Pancytopenia: Secondary to malignancy.  Hemoglobin this morning 8.6.  S/p 1 PRBC.  Also  transfused with a unit of  platelets.  Transfuse platelets if is less than 20000 and if bleeding. Recheck cbc  Nosebleed: Secondary to severe thrombocytopenia.  Stopped.  Severe protein calorie malnutrition: Will request for dietician  evaluation. Started on liquid diet    Neutropenia -awaiting bone marrow biopsy. Patient is currently afebrile.  Will defer further management to oncology  Constipation-we will give Colace  DVT prophylaxis:SCD Code Status: Full Family Communication:  discussed with wife by bedside  Disposition Plan: Home after full work-up, improvement in the pancytopenia  Consultants:  Oncology, nephrology Procedures:  bone marrow biopsy by IR   Antimicrobials: Anti-infectives (From admission, onward)   None      Subjective: Patient was seen and examined at bedside.  He appears comfortable, hemodynamically stable, denies any nosebleed, nurse reported no overnight events.   He is inquiring about results of his bone marrow biopsy.  He is also complaining of constipation Able to tolerate liquid diet.  Patient is neutropenic with white cell count around .  Platelets are stable .   Sodium is elevated, calcium is also high today.   Objective: Vitals:   01/17/19 0524 01/17/19 1815 01/17/19 2056 01/18/19 0509  BP: 109/71 107/66 109/70 130/72  Pulse: 91 89 88 (!) 101  Resp: '17  16 17  ' Temp: 98.2 F (36.8 C) 97.9 F (36.6 C) 98.6 F (37 C) (!) 97.5 F (36.4 C)  TempSrc: Oral Oral Oral Oral  SpO2: 100% 100% 97% 100%  Weight:      Height:        Intake/Output Summary (Last 24 hours) at 01/18/2019 1326 Last data filed at 01/18/2019 0820 Gross per 24  hour  Intake 1338.35 ml  Output 3700 ml  Net -2361.65 ml   Filed Weights   01/14/19 0415 01/15/19 0524 01/17/19 0500  Weight: 51.3 kg 49.7 kg 49.5 kg    Examination:  General exam: Appears calm and comfortable, thin built,  appears generally weak. Respiratory system: Clear to auscultation.  Respiratory effort normal. Cardiovascular system: S1 & S2 heard, RRR. No JVD, murmurs, rubs, gallops or clicks. No pedal edema. Gastrointestinal system: Abdomen is nondistended, soft and nontender. No organomegaly or masses felt. Normal bowel sounds heard. Central nervous system: Alert and oriented. No focal neurological deficits. Extremities: No edema, no clubbing Skin: No rashes, lesions or ulcers Psychiatry: Judgement and insight appear normal. Mood & affect appropriate.     Data Reviewed: I have personally reviewed following labs and imaging studies  CBC: Recent Labs  Lab 01/11/19 1349 01/13/19 0443 01/14/19 0431 01/15/19 0442 01/16/19 0526 01/17/19 0559 01/18/19 0407  WBC 1.9* 1.0* 1.1* 1.5* 1.4* 1.1* 1.0*  NEUTROABS 0.8* 0.5*  --   --  0.7*  --   --   HGB 8.6* 8.6* 9.7* 9.2* 8.7* 7.8* 8.3*  HCT 26.6* 25.8* 30.1* 29.3* 26.4* 24.1* 25.6*  MCV 94.7 88.1 89.6 90.2 87.7 87.0 88.9  PLT 9* 22* 18* 14* 10* 61* 48*   Basic Metabolic Panel: Recent Labs  Lab 01/14/19 0431 01/15/19 0442 01/15/19 1744 01/16/19 0526 01/16/19 1644 01/16/19 2342 01/17/19 0559 01/18/19 0407  NA 151* 152*  153*   < > 134* 135 135 138 147*  147*  K 4.1 3.1*  3.1*  --  2.8* 2.9* 3.2* 3.4* 3.6  3.6  CL 119* 119*  119*   < > 104 102 104 108 117*  116*  CO2 '27 26  27   ' < > '23 24 22 26 26  26  ' GLUCOSE 105* 163*  165*   < > 127* 108* 114* 101* 104*  103*  BUN '23 23  22   ' < > '11 9 10 12 18  18  ' CREATININE 1.16 1.05  1.06   < > 0.86 0.76 0.80 0.93 0.93  0.90  CALCIUM 13.6* 12.9*  13.0*   < > 11.4* 11.0* 10.9* 11.0* 12.5*  12.5*  MG 1.8 1.7  --  1.1*  --   --  1.8 1.8  PHOS 2.3* 2.1*  --  1.5*  --   --  4.3 4.1   < > = values in this interval not displayed.   GFR: Estimated Creatinine Clearance: 58.8 mL/min (by C-G formula based on SCr of 0.9 mg/dL). Liver Function Tests: Recent Labs  Lab 01/11/19 1349 01/13/19 0443 01/14/19 0431 01/15/19 0442 01/17/19 0559 01/18/19 0407  AST  33 25 56* 36  --   --   ALT 20 16 54* 40  --   --   ALKPHOS 74 59 132* 123  --   --   BILITOT 0.5 0.5 0.8 0.5  --   --   PROT 7.7 5.9* 7.1 7.2  --   --   ALBUMIN 3.4* 2.6* 2.9* 2.9*  2.7* 2.6* 2.7*   No results for input(s): LIPASE, AMYLASE in the last 168 hours. No results for input(s): AMMONIA in the last 168 hours. Coagulation Profile: Recent Labs  Lab 01/11/19 1349 01/13/19 1608  INR 1.0 1.0   Cardiac Enzymes: No results for input(s): CKTOTAL, CKMB, CKMBINDEX, TROPONINI in the last 168 hours. BNP (last 3 results) No results for input(s): PROBNP in the last 8760 hours. HbA1C:  No results for input(s): HGBA1C in the last 72 hours. CBG: Recent Labs  Lab 01/12/19 0953  GLUCAP 124*   Lipid Profile: No results for input(s): CHOL, HDL, LDLCALC, TRIG, CHOLHDL, LDLDIRECT in the last 72 hours. Thyroid Function Tests: No results for input(s): TSH, T4TOTAL, FREET4, T3FREE, THYROIDAB in the last 72 hours. Anemia Panel: No results for input(s): VITAMINB12, FOLATE, FERRITIN, TIBC, IRON, RETICCTPCT in the last 72 hours. Sepsis Labs: No results for input(s): PROCALCITON, LATICACIDVEN in the last 168 hours.  Recent Results (from the past 240 hour(s))  SARS CORONAVIRUS 2 (TAT 6-24 HRS) Nasopharyngeal Nasopharyngeal Swab     Status: None   Collection Time: 01/11/19  3:32 PM   Specimen: Nasopharyngeal Swab  Result Value Ref Range Status   SARS Coronavirus 2 NEGATIVE NEGATIVE Final    Comment: (NOTE) SARS-CoV-2 target nucleic acids are NOT DETECTED. The SARS-CoV-2 RNA is generally detectable in upper and lower respiratory specimens during the acute phase of infection. Negative results do not preclude SARS-CoV-2 infection, do not rule out co-infections with other pathogens, and should not be used as the sole basis for treatment or other patient management decisions. Negative results must be combined with clinical observations, patient history, and epidemiological information. The  expected result is Negative. Fact Sheet for Patients: SugarRoll.be Fact Sheet for Healthcare Providers: https://www.woods-mathews.com/ This test is not yet approved or cleared by the [place] FDA and  has been authorized for detection and/or diagnosis of SARS-CoV-2 by FDA under an Emergency Use Authorization (EUA). This EUA will remain  in effect (meaning this test can be used) for the duration of the COVID-19 declaration under Section 56 4(b)(1) of the Act, 21 U.S.C. section 360bbb-3(b)(1), unless the authorization is terminated or revoked sooner. Performed at [facility], [place] [address], [place], [place] [address]    Radiology Studies: DG Swallowing Func-Speech Pathology  Result Date: 01/16/2019 Objective Swallowing Evaluation: Type of Study: Bedside Swallow Evaluation  Patient Details Name: DYSEN EDMONDSON MRN: [id number] Date of Birth: 12-04-1955 Today's Date: 01/16/2019 Time: SLP Start Time (ACUTE ONLY): 1305 -SLP Stop Time (ACUTE ONLY): 1335 SLP Time Calculation (min) (ACUTE ONLY): 30 min Past Medical History: Past Medical History: Diagnosis Date . Arthritis  . Cancer ([place])   multiple myeloma . Cervical stenosis of spinal canal  . Wears glasses  Past Surgical History: Past Surgical History: Procedure Laterality Date . ANTERIOR CERVICAL CORPECTOMY N/A 11/04/2018  Procedure: Cervical three Corpectomy with Cervical two to Cervical four Plating;  Surgeon: Erline Levine, MD;  Location: [facility];  Service: Neurosurgery;  Laterality: N/A; . APPENDECTOMY   . BACK SURGERY   . EYE SURGERY Bilateral   cataract surgery with lens implants . HERNIA REPAIR Left   inguinal  . IR IMAGING GUIDED PORT INSERTION  06/21/2018 . ROTATOR CUFF REPAIR Left  HPI: Patient is a 63 y.o. male with PMH: multiple myeloma, arthritis, cervical stenosis, who presented to hospital with nosebleed which was not able to be controlled at home despite attempts with pressure, paper  towels and ice. He had similar episodes two weeks prior where bleeding lasted 8 hours before it finally stopped. Patient has had poor appetite for past couple weeks. Wife reported he seemed more confused in AM of admission. on 12/11, RN reported patient with significant difficulty with swallowing and unable to take medications and so SLP BSE ordered.  Pt underwent BSE on 01/14/2019 and was found to be too high risk for aspiration with recommendation for npo.  Subjective: pt  awake in chair in flouro suite Assessment / Plan / Recommendation CHL IP CLINICAL IMPRESSIONS 01/16/2019 Clinical Impression Pt presents with minimal oropharyngeal dysphagia without aspiration or laryngeal penetration of any consistency tested.  In general, pt's swallow is strong and timely with adequate airway protection even with sequential liquid bolus swallows.  He did demonstrate difficulty orally transiting tablet with thin barium - and after 3 unsuccesful attempts, pudding effective to transit.  In addition, decreased UES relaxation noted when swallowing masticated cracker which results in residuals without pt awareness.  Liquid swallow faciliated clearance but also resulted in trace backflow, thus recommend strict esophageal precautions.  Given pt's generalized weakness and decreased UES clearance with solids (not sensate) - dys3 diet advised at this time.  Medicine with puree- whole - starting and following with liquids.  Of note, pt did NOT cough during entire MBS - symptoms of cough with liquids reported prior to admission and during hospitalization.  SLP educated pt and his wife (present for test) to findings/recommendations.  Will follow up for dysphagia management/treatment. Anticipate pt will benefit from dysphagia treatment given his XRT to pharyngeal region. SLP Visit Diagnosis Dysphagia, oropharyngeal phase (R13.12) Attention and concentration deficit following -- Frontal lobe and executive function deficit following -- Impact  on safety and function Mild aspiration risk   CHL IP TREATMENT RECOMMENDATION 01/16/2019 Treatment Recommendations Therapy as outlined in treatment plan below   Prognosis 01/16/2019 Prognosis for Safe Diet Advancement Good Barriers to Reach Goals -- Barriers/Prognosis Comment -- CHL IP DIET RECOMMENDATION 01/16/2019 SLP Diet Recommendations Dysphagia 3 (Mech soft) solids;Thin liquid Liquid Administration via Cup;Straw Medication Administration Whole meds with puree Compensations Small sips/bites;Slow rate;Effortful swallow;Minimize environmental distractions Postural Changes --   CHL IP OTHER RECOMMENDATIONS 01/16/2019 Recommended Consults -- Oral Care Recommendations Oral care BID Other Recommendations Clarify dietary restrictions   CHL IP FOLLOW UP RECOMMENDATIONS 01/16/2019 Follow up Recommendations Skilled Nursing facility   [facility] IP FREQUENCY AND DURATION 01/16/2019 Speech Therapy Frequency (ACUTE ONLY) min 2x/week Treatment Duration 2 weeks      CHL IP ORAL PHASE 01/16/2019 Oral Phase Impaired Oral - Pudding Teaspoon -- Oral - Pudding Cup -- Oral - Honey Teaspoon -- Oral - Honey Cup -- Oral - Nectar Teaspoon -- Oral - Nectar Cup WFL Oral - Nectar Straw -- Oral - Thin Teaspoon -- Oral - Thin Cup WFL Oral - Thin Straw WFL Oral - Puree WFL Oral - Mech Soft WFL Oral - Regular -- Oral - Multi-Consistency -- Oral - Pill Reduced posterior propulsion;Weak lingual manipulation Oral Phase - Comment pt needed pudding to orally transit tablet after unable x3 with liquids  CHL IP PHARYNGEAL PHASE 01/16/2019 Pharyngeal Phase WFL;Impaired Pharyngeal- Pudding Teaspoon -- Pharyngeal -- Pharyngeal- Pudding Cup -- Pharyngeal -- Pharyngeal- Honey Teaspoon -- Pharyngeal -- Pharyngeal- Honey Cup -- Pharyngeal -- Pharyngeal- Nectar Teaspoon -- Pharyngeal -- Pharyngeal- Nectar Cup [facility] Pharyngeal Material does not enter airway Pharyngeal- Nectar Straw -- Pharyngeal -- Pharyngeal- Thin Teaspoon -- Pharyngeal -- Pharyngeal- Thin Cup [facility]  Pharyngeal Material does not enter airway Pharyngeal- Thin Straw WFL Pharyngeal Material does not enter airway Pharyngeal- Puree WFL Pharyngeal Material does not enter airway Pharyngeal- Mechanical Soft Pharyngeal residue - cp segment Pharyngeal Material does not enter airway Pharyngeal- Regular NT Pharyngeal -- Pharyngeal- Multi-consistency -- Pharyngeal -- Pharyngeal- Pill WFL Pharyngeal Material does not enter airway Pharyngeal Comment --  CHL IP CERVICAL ESOPHAGEAL PHASE 01/16/2019 Cervical Esophageal Phase Impaired Pudding Teaspoon -- Pudding Cup -- Honey Teaspoon -- Honey Cup -- [employer]  Teaspoon -- Nectar Cup [facility] Nectar Straw -- Thin Teaspoon -- Thin Cup WFL Thin Straw Reduced cricopharyngeal relaxation;Esophageal backflow into cervical esophagus Puree WFL Mechanical Soft Reduced cricopharyngeal relaxation;Esophageal backflow into cervical esophagus Regular -- Multi-consistency -- Pill WFL Cervical Esophageal Comment pt without sensation to minimal residuals at pyriform/above UES region, liquid assisted clearance but also resulted in trace backflow, barium tablet swallowed with pudding appeared to transit into sttomach without delay Kathleen Lime, MS [facility] SLP Acute Rehab Services Office [phone] [name] 01/16/2019, 2:19 PM              Scheduled Meds: . sodium chloride   Intravenous Once  . sodium chloride   Intravenous Once  . sodium chloride   Intravenous Once  . Chlorhexidine Gluconate Cloth  6 each Topical Daily  . docusate sodium  100 mg Oral BID  . dronabinol  10 mg Oral BID  . feeding supplement  1 Container Oral TID BM  . feeding supplement (PRO-STAT SUGAR FREE 64)  30 mL Oral BID  . gabapentin  300 mg Oral TID  . morphine  30 mg Oral Q12H  . oxymetazoline  1 spray Each Nare BID  . senna-docusate  1 tablet Oral Daily  . sucralfate  1 g Oral TID AC & HS  . tranexamic acid  500 mg Topical Once   Continuous Infusions: . sodium chloride 125 mL/hr at 01/18/19 1042     LOS: 6  days    Time spent:    Vicenta Dunning, MD [facility]   If 7PM-7AM, please contact night-coverage

## 2019-01-18 NOTE — Progress Notes (Signed)
Nutrition Follow-up  DOCUMENTATION CODES:   Underweight  INTERVENTION:   -Recommend checking serum vitamin D levels, replete if Vitamin D deficiency is present -Continue Boost Breeze po TID, each supplement provides 250 kcal and 9 grams of protein (contains 10 mg of Ca+ per bottle) -Continue Prostat liquid protein PO 30 ml BID with meals, each supplement provides 100 kcal, 15 grams protein.  NUTRITION DIAGNOSIS:   Increased nutrient needs related to cancer and cancer related treatments as evidenced by estimated needs.  Ongoing.  GOAL:   Patient will meet greater than or equal to 90% of their needs  Progressing.   MONITOR:   PO intake, Supplement acceptance, Labs, Weight trends, I & O's  REASON FOR ASSESSMENT:   Consult Assessment of nutrition requirement/status  ASSESSMENT:   63-year-old male with history of multiple myeloma, arthritis, cervical stenosis who presented with nosebleed.  He had a similar episode about 2 weeks ago which lasted for 8 hours but spontaneously resolved.  He follows with oncologist at [facility].  Found to have severe thrombocytopenia and presentation and also has normocytic anemia. Admitted for hypercalcemia.  **RD working remotely**  Patient continues to have hypercalcemia. Calcium levels improved after IVF and calcitonin doses over the weekend but now increasing again.  Per nephrology note, hypercalcemia likely related to myeloma bony infiltration.   Unable to reach pt by phone today. Per documentation, patient currently consuming 50% of meals, pt is drinking ~2 Boost Breezes daily and taking Prostat. Both of these supplements contain little to no calcium.   Recommend checking Vitamin D levels as this could be a contributing factor given poor PO intakes. Mg/Phos/K have all had to be repleted as well.   Admission weight: 108 lbs. Current weight: 109 lbs.   I/Os: -4L since admit UOP: 3.7L x 24 hrs  Medications: Marinol capsule, Carafate  tablet Labs reviewed: Elevated Na Calcium 12.5 -elevating Mg/Phos WNL  Diet Order:   Diet Order            DIET DYS 3 Room service appropriate? Yes with Assist; Fluid consistency: Thin  Diet effective now              EDUCATION NEEDS:   Not appropriate for education at this time  Skin:  Skin Assessment: Reviewed RN Assessment  Last BM:  12/14 -type 7  Height:   Ht Readings from Last 1 Encounters:  01/12/19 5' 7" (1.702 m)    Weight:   Wt Readings from Last 1 Encounters:  01/17/19 49.5 kg    Ideal Body Weight:  67.2 kg  BMI:  Body mass index is 17.09 kg/m.  Estimated Nutritional Needs:   Kcal:  1550-1750  Protein:  75-85g  Fluid:  1.8L/day   Lindsey Baker, MS, RD, LDN Inpatient Clinical Dietitian Pager: [phone] After Hours Pager: [phone]  

## 2019-01-19 ENCOUNTER — Telehealth: Payer: Self-pay | Admitting: Radiation Oncology

## 2019-01-19 LAB — PHOSPHORUS: Phosphorus: 2.3 mg/dL — ABNORMAL LOW (ref 2.5–4.6)

## 2019-01-19 LAB — BASIC METABOLIC PANEL
Anion gap: 7 (ref 5–15)
BUN: 16 mg/dL (ref 8–23)
CO2: 23 mmol/L (ref 22–32)
Calcium: 11.1 mg/dL — ABNORMAL HIGH (ref 8.9–10.3)
Chloride: 106 mmol/L (ref 98–111)
Creatinine, Ser: 0.74 mg/dL (ref 0.61–1.24)
GFR calc Af Amer: >60 mL/min (ref >60)
GFR calc non Af Amer: >60 mL/min (ref >60)
Glucose, Bld: 163 mg/dL — ABNORMAL HIGH (ref 70–99)
Potassium: 3.7 mmol/L (ref 3.5–5.1)
Sodium: 136 mmol/L (ref 135–145)

## 2019-01-19 LAB — CBC
HCT: 25.1 % — ABNORMAL LOW (ref 39.0–52.0)
Hemoglobin: 8.1 g/dL — ABNORMAL LOW (ref 13.0–17.0)
MCH: 28.7 pg (ref 26.0–34.0)
MCHC: 32.3 g/dL (ref 30.0–36.0)
MCV: 89 fL (ref 80.0–100.0)
Platelets: 36 10*3/uL — ABNORMAL LOW (ref 150–400)
RBC: 2.82 MIL/uL — ABNORMAL LOW (ref 4.22–5.81)
RDW: 19.7 % — ABNORMAL HIGH (ref 11.5–15.5)
WBC: 2.1 10*3/uL — ABNORMAL LOW (ref 4.0–10.5)
nRBC: 0 % (ref 0.0–0.2)

## 2019-01-19 LAB — MAGNESIUM: Magnesium: 1.4 mg/dL — ABNORMAL LOW (ref 1.7–2.4)

## 2019-01-19 MED ORDER — SODIUM CHLORIDE 0.9 % IV SOLN: INTRAVENOUS | Status: DC

## 2019-01-19 NOTE — Progress Notes (Signed)
While performing routine line care, patient's port a cath needle is due to be changed, patient at this time refused needle to be changed states he told them last night not to change it leave it alone its working fine

## 2019-01-19 NOTE — Care Management Important Message (Signed)
Important Message  Patient Details IM Letter given to Roque Lias SW Case Manager to present to the Patient Name: Jesus [name] MRN: [id number] Date of Birth: 1955-03-25   Medicare Important Message Given:  Yes     Kerin Salen 01/19/2019, 12:01 PM

## 2019-01-19 NOTE — Progress Notes (Signed)
PROGRESS NOTE    Jesus [name]  FAO:[id number] DOB: 1955/09/16 DOA: 01/11/2019 PCP: Patient, No Pcp Per    Brief Narrative:  Patient is a 63 year old male with history of multiple myeloma, arthritis, cervical stenosis who presented with nosebleed.  He had a similar episode about 2 weeks ago which lasted for 8 hours but spontaneously resolved.  He follows with oncologist at [facility] to have severe thrombocytopenia at presentation and also has normocytic anemia.  Hematology/oncology consulted.  Nosebleed has stopped.  Being transfused today for anemia.  Also found to have hypercalcemia secondary to MM and was given a dose of Zometa and Decadron.  Assessment & Plan:   Active Problems:   Hypercalcemia   Epistaxis   Thrombocytopenia (HCC)   Anemia   Leukopenia   # Multiple myeloma: Follows with Dr Irene Limbo and also Dr. Blinda Leatherwood ,,oncology, at [facility].  Has history of recent C3 compression fracture status post corpectomy, C2-C4 plating.  Oncology following, suggest bone marrow biopsy by IR , completed 12/11, follow up results. Bone scan: Uptake over the right acetabulum, mid to lower thoracic spine, upper anterior ribs and possibly cervicothoracic junction likely representing patient's known multiple myeloma.  Patient has history of an aggressive 17P mutated multiple myeloma currently on daratumumab  Bone marrow biopys report - There is a mild increased in monotypic plasma cells (5-10%) consistent  with persistent involvement by the patient's plasma cell myeloma. There  is mild myeloid dysplasia raising the question of an underlying  myelodysplastic syndrome and FISH for MDS has been ordered.   Discussed with oncology nurse practitioner.  Awaiting bone marrow biopsy results.  Patient would likely benefit with palliative consultation, will defer to oncology.   Hypercalcemia: Secondary to malignancy.  Calcium level still elevated..  Continue aggressive  IV fluids.  Given  Zometa and Decadron, calcitonin - last dose on 12/16  Nephrology consulted IV fluid switched to 0.45 normal saline due to hyponatremia  Hypokalemia  - replete as needed  Hypernatremia: 0.45 ns  @ 125 cc/hr, recheck labs  Pancytopenia: Secondary to malignancy.  Hemoglobin this morning 8.6.  S/p 1 PRBC.  Also transfused with a unit of  platelets.  Transfuse platelets if is less than 20000 and if bleeding. Recheck cbc  Nosebleed: Secondary to severe thrombocytopenia.  Stopped.  Severe protein calorie malnutrition: Will request for dietician  evaluation. Started on liquid diet    Neutropenia -awaiting bone marrow biopsy. Patient is currently afebrile.  Will defer further management to oncology  Constipation-we will give Colace  DVT prophylaxis:SCD Code Status: Full Family Communication:  discussed with wife by bedside  Disposition Plan: Home after full work-up, improvement in the pancytopenia  Consultants:  Oncology, nephrology Procedures:  bone marrow biopsy by IR   Antimicrobials: Anti-infectives (From admission, onward)   None      Subjective: Patient was seen and examined at bedside.  He appears comfortable, hemodynamically stable, denies any nosebleed, nurse reported no overnight events.   He is inquiring about results of his bone marrow biopsy.  He is also complaining of constipation Able to tolerate liquid diet.  Patient sodium and calcium got better with IV fluids and calcitonin, white cell count increased to 2, however platelets are steadily going down, 30 today.  No active bleeding.   Objective: Vitals:   01/18/19 1451 01/18/19 1943 01/19/19 0556 01/19/19 1447  BP: (!) 145/86 (!) 150/89 126/81 133/79  Pulse: 92 (!) 104 88 94  Resp: '17 18 18 ' 20  Temp: 97.6 F (36.4 C) 97.9 F (36.6 C) 98 F (36.7 C) 98.1 F (36.7 C)  TempSrc: Oral Oral Oral Oral  SpO2: 96% 100% 99% 98%  Weight:      Height:        Intake/Output Summary (Last 24 hours) at  01/19/2019 1849 Last data filed at 01/19/2019 1552 Gross per 24 hour  Intake 4062.41 ml  Output 7650 ml  Net -3587.59 ml   Filed Weights   01/14/19 0415 01/15/19 0524 01/17/19 0500  Weight: 51.3 kg 49.7 kg 49.5 kg    Examination:  General exam: Appears calm and comfortable, thin built,  appears generally weak. Respiratory system: Clear to auscultation. Respiratory effort normal. Cardiovascular system: S1 & S2 heard, RRR. No JVD, murmurs, rubs, gallops or clicks. No pedal edema. Gastrointestinal system: Abdomen is nondistended, soft and nontender. No organomegaly or masses felt. Normal bowel sounds heard. Central nervous system: Alert and oriented. No focal neurological deficits. Extremities: No edema, no clubbing Skin: No rashes, lesions or ulcers Psychiatry: Judgement and insight appear normal. Mood & affect appropriate.     Data Reviewed: I have personally reviewed following labs and imaging studies  CBC: Recent Labs  Lab 01/13/19 0443 01/15/19 0442 01/16/19 0526 01/17/19 0559 01/18/19 0407 01/19/19 0249  WBC 1.0* 1.5* 1.4* 1.1* 1.0* 2.1*  NEUTROABS 0.5*  --  0.7*  --   --   --   HGB 8.6* 9.2* 8.7* 7.8* 8.3* 8.1*  HCT 25.8* 29.3* 26.4* 24.1* 25.6* 25.1*  MCV 88.1 90.2 87.7 87.0 88.9 89.0  PLT 22* 14* 10* 61* 48* 36*   Basic Metabolic Panel: Recent Labs  Lab 01/15/19 0442 01/15/19 1744 01/16/19 0526 01/16/19 2342 01/17/19 0559 01/18/19 0407 01/18/19 1605 01/19/19 0249  NA 152*   153*   < > 134* 135 138 147*   147* 142 136  K 3.1*   3.1*  --  2.8* 3.2* 3.4* 3.6   3.6 3.5 3.7  CL 119*   119*   < > 104 104 108 117*   116* 112* 106  CO2 26   27   < > '23 22 26 26   26 25 23  ' GLUCOSE 163*   165*   < > 127* 114* 101* 104*   103* 125* 163*  BUN 23   22   < > '11 10 12 18   18 14 16  ' CREATININE 1.05   1.06   < > 0.86 0.80 0.93 0.93   0.90 0.84 0.74  CALCIUM 12.9*   13.0*   < > 11.4* 10.9* 11.0* 12.5*   12.5* 12.5* 11.1*  MG 1.7  --  1.1*  --  1.8 1.8  --  1.4*    PHOS 2.1*  --  1.5*  --  4.3 4.1  --  2.3*   < > = values in this interval not displayed.   GFR: Estimated Creatinine Clearance: 66.2 mL/min (by C-G formula based on SCr of 0.74 mg/dL). Liver Function Tests: Recent Labs  Lab 01/13/19 0443 01/14/19 0431 01/15/19 0442 01/17/19 0559 01/18/19 0407  AST 25 56* 36  --   --   ALT 16 54* 40  --   --   ALKPHOS 59 132* 123  --   --   BILITOT 0.5 0.8 0.5  --   --   PROT 5.9* 7.1 7.2  --   --   ALBUMIN 2.6* 2.9* 2.9*   2.7* 2.6* 2.7*   No results for input(s):  LIPASE, AMYLASE in the last 168 hours. No results for input(s): AMMONIA in the last 168 hours. Coagulation Profile: Recent Labs  Lab 01/13/19 1608  INR 1.0   Cardiac Enzymes: No results for input(s): CKTOTAL, CKMB, CKMBINDEX, TROPONINI in the last 168 hours. BNP (last 3 results) No results for input(s): PROBNP in the last 8760 hours. HbA1C: No results for input(s): HGBA1C in the last 72 hours. CBG: No results for input(s): GLUCAP in the last 168 hours. Lipid Profile: No results for input(s): CHOL, HDL, LDLCALC, TRIG, CHOLHDL, LDLDIRECT in the last 72 hours. Thyroid Function Tests: No results for input(s): TSH, T4TOTAL, FREET4, T3FREE, THYROIDAB in the last 72 hours. Anemia Panel: No results for input(s): VITAMINB12, FOLATE, FERRITIN, TIBC, IRON, RETICCTPCT in the last 72 hours. Sepsis Labs: No results for input(s): PROCALCITON, LATICACIDVEN in the last 168 hours.  Recent Results (from the past 240 hour(s))  SARS CORONAVIRUS 2 (TAT 6-24 HRS) Nasopharyngeal Nasopharyngeal Swab     Status: None   Collection Time: 01/11/19  3:32 PM   Specimen: Nasopharyngeal Swab  Result Value Ref Range Status   SARS Coronavirus 2 NEGATIVE NEGATIVE Final    Comment: (NOTE) SARS-CoV-2 target nucleic acids are NOT DETECTED. The SARS-CoV-2 RNA is generally detectable in upper and lower respiratory specimens during the acute phase of infection. Negative results do not preclude SARS-CoV-2  infection, do not rule out co-infections with other pathogens, and should not be used as the sole basis for treatment or other patient management decisions. Negative results must be combined with clinical observations, patient history, and epidemiological information. The expected result is Negative. Fact Sheet for Patients: SugarRoll.be Fact Sheet for Healthcare Providers: https://www.woods-mathews.com/ This test is not yet approved or cleared by the [place] FDA and  has been authorized for detection and/or diagnosis of SARS-CoV-2 by FDA under an Emergency Use Authorization (EUA). This EUA will remain  in effect (meaning this test can be used) for the duration of the COVID-19 declaration under Section 56 4(b)(1) of the Act, 21 U.S.C. section 360bbb-3(b)(1), unless the authorization is terminated or revoked sooner. Performed at [facility], [place] [address] [place], [place] [address]    Radiology Studies: No results found. Scheduled Meds:  sodium chloride   Intravenous Once   sodium chloride   Intravenous Once   sodium chloride   Intravenous Once   Chlorhexidine Gluconate Cloth  6 each Topical Daily   docusate sodium  100 mg Oral BID   dronabinol  10 mg Oral BID   feeding supplement  1 Container Oral TID BM   feeding supplement (PRO-STAT SUGAR FREE 64)  30 mL Oral BID   gabapentin  300 mg Oral TID   morphine  30 mg Oral Q12H   oxymetazoline  1 spray Each Nare BID   senna-docusate  1 tablet Oral Daily   sucralfate  1 g Oral TID AC & HS   tranexamic acid  500 mg Topical Once   Continuous Infusions:  sodium chloride 125 mL/hr at 01/19/19 1552     LOS: 7 days    Time spent:    Vicenta Dunning, MD [facility]   If 7PM-7AM, please contact night-coverage

## 2019-01-19 NOTE — Telephone Encounter (Signed)
  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 The right iliac region of the pelvis was treated to 35 Gy in 14 fractions.  07/11/2018-07/22/2018:  30 Gy in 10 fractions to the T spine, Right Scapula, Right Femur, and Left Hip.  08/19/16-08/24/16:  Thoracic spine levels T9-T11 received 12 Gy. He was planning to receive 30 Gy in 10 fractions but this was inturuppted by emergent surgery due to cord compression of T10. He did not complete his course. He was treated in [place] with Dr. Lysle Morales.  Narrative:  The patient was contacted today for routine follow-up. During treatment he did very well with radiotherapy and did not have significant desquamation. He is currently hospitalized with hypercalcemia from his myeloma but is starting to improve slowly per his wife.  Impression/Plan: 1. 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 I spoke with the patient's wife who answered my call The patient has been doing well with relief in his pain since completion of radiotherapy. We discussed that we would be happy to continue to follow her as needed, but he will also continue to follow up with Dr. Irene Limbo in medical oncology. He will continue his current care at [facility] while they manage side effects of hypercalcemia from his disease.     Carola Rhine, PAC

## 2019-01-19 NOTE — Progress Notes (Signed)
[facility] Progress Note  Subjective:  7.4L UOP yesterday rec calcitonin overnight Still on 1/2 NS@ 150mL/hr Ca down to 11.1 Nl GFR, K ok Rec zolendronate 12/9 VS stable    Vitals:   01/18/19 1451 01/18/19 1943 01/19/19 0556 01/19/19 1447  BP: (!) 145/86 (!) 150/89 126/81 133/79  Pulse: 92 (!) 104 88 94  Resp: 17 18 18 20   Temp: 97.6 F (36.4 C) 97.9 F (36.6 C) 98 F (36.7 C) 98.1 F (36.7 C)  TempSrc: Oral Oral Oral Oral  SpO2: 96% 100% 99% 98%  Weight:      Height:        Exam: NAD, debilitated HEENT NCAT Sclera anicteric Chest clear bilat to bases; no crackles or wheezing; unlabored on room air  Heart tachycardia no Rub  Abd soft ntnd Psych calm mood and affect Ext no LE or UE edema appreciated; no cyanosis or clubbing Neuro alert and oriented and answers questions   Assessment/ Plan: 1. Hypercalcemia - due to myeloma bony infiltration most likely.  Pancytopenia supports this possibility as well  Improving after bisphosphate, aggressive hydration, and calcitonin  Cont IVF today but change back to NS  treatment of cancer , pt getting IV decadron per oncology  2. Hypernatremia - improved, as above 3. AMS - felt secondary in part to hypercalcemia; improving. 4. Mult myeloma - onset in 2018, per ONC 5. AKI - resolved 6. SP recent Cspine surgery 7. Pancytopenia -per ONC 8. Hypokalemia - stable  9. hypophos - resolved with repletion     Inpatient medications: . sodium chloride   Intravenous Once  . sodium chloride   Intravenous Once  . sodium chloride   Intravenous Once  . Chlorhexidine Gluconate Cloth  6 each Topical Daily  . docusate sodium  100 mg Oral BID  . dronabinol  10 mg Oral BID  . feeding supplement  1 Container Oral TID BM  . feeding supplement (PRO-STAT SUGAR FREE 64)  30 mL Oral BID  . gabapentin  300 mg Oral TID  . morphine  30 mg Oral Q12H  . oxymetazoline  1 spray Each Nare BID  . senna-docusate  1 tablet Oral  Daily  . sucralfate  1 g Oral TID AC & HS  . tranexamic acid  500 mg Topical Once   . sodium chloride 125 mL/hr at 01/19/19 1511   acetaminophen, bisacodyl, docusate, methocarbamol, morphine injection, ondansetron, oxyCODONE, senna-docusate, sodium chloride flush   Rexene Agent 01/19/2019 3:29 PM

## 2019-01-20 LAB — BASIC METABOLIC PANEL
Anion gap: 4 — ABNORMAL LOW (ref 5–15)
BUN: 16 mg/dL (ref 8–23)
CO2: 29 mmol/L (ref 22–32)
Calcium: 11.9 mg/dL — ABNORMAL HIGH (ref 8.9–10.3)
Chloride: 113 mmol/L — ABNORMAL HIGH (ref 98–111)
Creatinine, Ser: 0.85 mg/dL (ref 0.61–1.24)
GFR calc Af Amer: >60 mL/min (ref >60)
GFR calc non Af Amer: >60 mL/min (ref >60)
Glucose, Bld: 99 mg/dL (ref 70–99)
Potassium: 3.1 mmol/L — ABNORMAL LOW (ref 3.5–5.1)
Sodium: 146 mmol/L — ABNORMAL HIGH (ref 135–145)

## 2019-01-20 LAB — CBC
HCT: 23.6 % — ABNORMAL LOW (ref 39.0–52.0)
Hemoglobin: 7.6 g/dL — ABNORMAL LOW (ref 13.0–17.0)
MCH: 28.5 pg (ref 26.0–34.0)
MCHC: 32.2 g/dL (ref 30.0–36.0)
MCV: 88.4 fL (ref 80.0–100.0)
Platelets: 25 10*3/uL — CL (ref 150–400)
RBC: 2.67 MIL/uL — ABNORMAL LOW (ref 4.22–5.81)
RDW: 19.7 % — ABNORMAL HIGH (ref 11.5–15.5)
WBC: 1.3 10*3/uL — CL (ref 4.0–10.5)
nRBC: 0 % (ref 0.0–0.2)

## 2019-01-20 LAB — MAGNESIUM: Magnesium: 1.6 mg/dL — ABNORMAL LOW (ref 1.7–2.4)

## 2019-01-20 MED ORDER — POTASSIUM CHLORIDE CRYS ER 20 MEQ PO TBCR
40.0000 meq | EXTENDED_RELEASE_TABLET | Freq: Every day | ORAL | Status: AC
  Administered 2019-01-20 – 2019-01-22 (×3): 40 meq via ORAL
  Filled 2019-01-20 (×3): qty 2

## 2019-01-20 MED ORDER — SODIUM CHLORIDE 0.45 % IV SOLN: INTRAVENOUS | Status: DC

## 2019-01-20 NOTE — Progress Notes (Signed)
Patient is having frequent nosebleeds after blowing his nose. Pressure held to nose.

## 2019-01-20 NOTE — Progress Notes (Signed)
CRITICAL VALUE ALERT  Critical Value:  WBC = 1.3; platelet = 25  Date & Time Notied:  01/20/2019 at 0558  Provider Notified: MD on call  Orders Received/Actions taken: waiting for new order.

## 2019-01-20 NOTE — Progress Notes (Signed)
Jesus [name]   HEMATOLOGY/ONCOLOGY INPATIENT PROGRESS NOTE  Date of Service: 01/19/2019 Inpatient Attending: .Vicenta Dunning, MD   SUBJECTIVE  Patient was seen in follow-up with his wife at bedside.  I discussed his bone marrow biopsy results in details with him and his wife.  Patient appears quite fatigued.  Notes he is eating a little better.  I discussed my concern for his significant cytopenias and his significantly worsened performance status.  We discussed at length the bone marrow biopsy which shows increase in multiple myeloma involvement of 5 to 10% of bone marrow with additional concerns for possible myelodysplastic syndrome that is affecting his blood counts.  FISH panel for MDS has been requested and is currently pending.  It is also possible that there could be some idiosyncratic cytopenias from immune reaction to daratumumab though this is less likely. His bone scan does show active bone disease involved in causing his hypercalcemia. We discussed that further treatment options are going to be based on functional status and ability to sustain himself.  He was to be followed by Dr. Debbrah Alar at the end of this month to consider clinical trial with ibrutinib/Revlimid/dexamethasone. We discussed that the additional presence of MDS does complicate his treatment options since the associated cytopenias will significantly increase his burden of care especially with consideration of chemotherapeutic options. We spent a significant bit of time discussing goals of cares and also discussed that a comfort care approach would be quite reasonable.  Patient and his wife would like to see how he does in the next few days.  I recommended he try to eat as well as he can and start trying to get out of bed and ambulate with help as much as possible.  I with their consent also requested physical therapy and Occupational Therapy evaluations to determine his functional status and discharge needs.  OBJECTIVE:  PHYSICAL  EXAMINATION: .  01/19/19 1447 01/19/19 2102  BP: 133/79 129/82  Pulse: 94 92  Resp: 20 18  Temp: 98.1 F (36.7 C) 98.2 F (36.8 C)  TempSrc: Oral Oral  SpO2: 98% 98%  Weight:    Height:     Filed Weights   01/15/19 0524 01/17/19 0500 01/20/19 0500  Weight: 109 lb 9.1 oz (49.7 kg) 109 lb 2 oz (49.5 kg) 110 lb 3.7 oz (50 kg)   .Body mass index is 17.26 kg/m.  GENERAL:alert, resolved epistaxis.  Looks comfortable SKIN: No rashes or petechiae EYES: Sunken eyes OROPHARYNX: Dry mucous membranes  NECK: supple, LYMPH:  no palpable lymphadenopathy in the cervical, axillary or inguinal LUNGS: clear to auscultation with normal respiratory effort HEART: regular rate & rhythm,  no murmurs and no lower extremity edema ABDOMEN: abdomen soft, non-tender, normoactive bowel sounds  Musculoskeletal: no cyanosis of digits and no clubbing  PSYCH: alert & somewhat confused and a little sleepy. NEURO: no focal motor/sensory deficits.  Minimally conversant -received mild sedation during bone marrow biopsy procedure   MEDICAL HISTORY:  Past Medical History:  Diagnosis Date   Arthritis    Cancer ([place])    multiple myeloma   Cervical stenosis of spinal canal    Wears glasses     SURGICAL HISTORY: Past Surgical History:  Procedure Laterality Date   ANTERIOR CERVICAL CORPECTOMY N/A 11/04/2018   Procedure: Cervical three Corpectomy with Cervical two to Cervical four Plating;  Surgeon: Erline Levine, MD;  Location: [facility];  Service: Neurosurgery;  Laterality: N/A;   APPENDECTOMY     BACK SURGERY  EYE SURGERY Bilateral    cataract surgery with lens implants   HERNIA REPAIR Left    inguinal    IR IMAGING GUIDED PORT INSERTION  06/21/2018   ROTATOR CUFF REPAIR Left     SOCIAL HISTORY: Social History   Socioeconomic History   Marital status: Married    Spouse name: Not on file   Number of children: Not on file   Years of education: Not on file   Highest education level:  Not on file  Occupational History   Not on file  Tobacco Use   Smoking status: Never Smoker   Smokeless tobacco: Never Used  Substance and Sexual Activity   Alcohol use: Never   Drug use: Never   Sexual activity: Not on file  Other Topics Concern   Not on file  Social History Narrative   Not on file   Social Determinants of Health   Financial Resource Strain:    Difficulty of Paying Living Expenses: Not on file  Food Insecurity:    Worried About Running Out of Food in the Last Year: Not on file   Ran Out of Food in the Last Year: Not on file  Transportation Needs: No Transportation Needs   Lack of Transportation (Medical): No   Lack of Transportation (Non-Medical): No  Physical Activity:    Days of Exercise per Week: Not on file   Minutes of Exercise per Session: Not on file  Stress:    Feeling of Stress : Not on file  Social Connections:    Frequency of Communication with Friends and Family: Not on file   Frequency of Social Gatherings with Friends and Family: Not on file   Attends Religious Services: Not on file   Active Member of Clubs or Organizations: Not on file   Attends Archivist Meetings: Not on file   Marital Status: Not on file  Intimate Partner Violence:    Fear of Current or Ex-Partner: Not on file   Emotionally Abused: Not on file   Physically Abused: Not on file   Sexually Abused: Not on file    FAMILY HISTORY: Family History  Problem Relation Age of Onset   Breast cancer Sister     ALLERGIES:  is allergic to penicillins.  MEDICATIONS:  Scheduled Meds:  sodium chloride   Intravenous Once   sodium chloride   Intravenous Once   sodium chloride   Intravenous Once   Chlorhexidine Gluconate Cloth  6 each Topical Daily   docusate sodium  100 mg Oral BID   dronabinol  10 mg Oral BID   feeding supplement  1 Container Oral TID BM   feeding supplement (PRO-STAT SUGAR FREE 64)  30 mL Oral BID   gabapentin   300 mg Oral TID   morphine  30 mg Oral Q12H   oxymetazoline  1 spray Each Nare BID   potassium chloride  40 mEq Oral Daily   senna-docusate  1 tablet Oral Daily   sucralfate  1 g Oral TID AC & HS   tranexamic acid  500 mg Topical Once   Continuous Infusions:  sodium chloride 125 mL/hr at 01/20/19 0536   PRN Meds:.  REVIEW OF SYSTEMS:    10 Point review of Systems was done is negative except as noted above.   LABORATORY DATA:  I have reviewed the data as listed  . CBC Latest Ref Rng & Units 01/19/2019 01/18/2019  WBC 4.0 - 10.5 K/uL 2.1(L) 1.0(LL)  Hemoglobin 13.0 - 17.0  g/dL 8.1(L) 8.3(L)  Hematocrit 39.0 - 52.0 % 25.1(L) 25.6(L)  Platelets 150 - 400 K/uL 36(L) 48(L)    . CMP Latest Ref Rng & Units 01/19/2019 01/18/2019  Glucose 70 - 99 mg/dL 163(H) 125(H)  BUN 8 - 23 mg/dL 16 14  Creatinine 0.61 - 1.24 mg/dL 0.74 0.84  Sodium 135 - 145 mmol/L 136 142  Potassium 3.5 - 5.1 mmol/L 3.7 3.5  Chloride 98 - 111 mmol/L 106 112(H)  CO2 22 - 32 mmol/L 23 25  Calcium 8.9 - 10.3 mg/dL 11.1(H) 12.5(H)  Total Protein 6.5 - 8.1 g/dL - -  Total Bilirubin 0.3 - 1.2 mg/dL - -  Alkaline Phos 38 - 126 U/L - -  AST 15 - 41 U/L - -  ALT 0 - 44 U/L - -   Surgical Pathology  CASE: [id number]  PATIENT: Kavian Freelove  Bone Marrow Report      Clinical History: Multiple myeloma, concern for disease progression      DIAGNOSIS:   BONE MARROW, ASPIRATE, CLOT, CORE:  - Persistent involvement by plasma cell myeloma.  - Mild myeloid dysplasia.  - See comment.   PERIPHERAL BLOOD:  - Pancytopenia.   COMMENT:   There is a mild increased in monotypic plasma cells (5-10%) consistent  with persistent involvement by the patient's plasma cell myeloma. There  is mild myeloid dysplasia raising the question of an underlying  myelodysplastic syndrome and FISH for MDS has been ordered.   MICROSCOPIC DESCRIPTION:   PERIPHERAL BLOOD SMEAR: There is a normocytic anemia with  hypochromic  cells and mild rouleaux formation. Leukocytes are reduced in numbers  with pseudo-Pelger-Hut type neutrophils. Circulating plasma cells are  not identified. Platelets are reduced in numbers.   BONE MARROW ASPIRATE: Spicular and cellular.  Erythroid precursors: Mild relative increase in numbers. No significant  dysplasia.  Granulocytic precursors: Mild relative decrease in. Mild left shifted  maturation. No increase in blasts.  Megakaryocytes: Decreased in numbers but with unremarkable morphology.  Lymphocytes/plasma cells: Plasma cells are not significantly increased,  but atypical forms (large forms, bizarre forms, multi-nucleated forms  and forms with prominent nucleoli) are present. There is no increase in  lymphocytes.   TOUCH PREPARATIONS: Similar to aspirate smears.   CLOT AND BIOPSY: The core biopsy and clot section are variably cellular  (30 to 60%). There are focal clusters of atypical plasma cells  highlighted by CD138 (estimated 5-10% of cells) with associated  fibrosis. By in situ hybridization plasma cells are lambda restricted.  Background hematopoiesis is present with a mild erythroid  hyperplasia/myeloid hypoplasia. Megakaryocytes are slightly reduced.  There are no atypical lymphoid aggregates.   IRON STAIN: Iron stains are performed on a bone marrow aspirate or touch  imprint smear and section of clot. The controls stained appropriately.     Storage Iron: Present    Ring Sideroblasts: Absent   ADDITIONAL DATA/TESTING: Cytogenetics, including FISH for myeloma and  MDS, was ordered and will be reported separately. c  RADIOGRAPHIC STUDIES: I have personally reviewed the radiological images as listed and agreed with the findings in the report. NM Bone Scan Whole Body  Result Date: 01/12/2019 CLINICAL DATA:  History of multiple myeloma with acute back pain. EXAM: NUCLEAR MEDICINE WHOLE BODY BONE SCAN TECHNIQUE: Whole body anterior and  posterior images were obtained approximately 3 hours after intravenous injection of radiopharmaceutical. RADIOPHARMACEUTICALS:  21.8 mCi Technetium-66mMDP IV COMPARISON:  PET-CT 10/13/2018 FINDINGS: Examination demonstrates patchy increased uptake over the mid to lower thoracic  spine over known areas of fusion hardware and bone destruction representing patient's known multiple myeloma. Mild uptake over the right acetabulum likely representing patient's multiple myeloma. Minimal hazy uptake on the anterior image at the cervicothoracic junction which may represent patient's known myelomatous involvement of the cervical spine. Minimal patchy uptake over several anterior upper ribs bilaterally which may be due to patient's myomatous disease. Minimal degenerative changes over the shoulders and knees. IMPRESSION: 1. Uptake over the right acetabulum, mid to lower thoracic spine, upper anterior ribs and possibly cervicothoracic junction likely representing patient's known multiple myeloma. 2.  Degenerative changes as described. Electronically Signed   By: Marin Olp M.D.   On: 01/12/2019 13:23   CT BIOPSY  Result Date: 01/16/2019 INDICATION: 63 year old male with a history of multiple myeloma status post stem cell transplant and chemotherapy. He presents with pancytopenia and severe hypercalcemia. He requires bone marrow biopsy to evaluate for progression of disease. EXAM: CT GUIDED BONE MARROW ASPIRATION AND CORE BIOPSY Interventional Radiologist:  Criselda Peaches, MD MEDICATIONS: None. ANESTHESIA/SEDATION: Moderate (conscious) sedation was employed during this procedure. A total of 2 milligrams versed and 100 micrograms fentanyl were administered intravenously. The patient's level of consciousness and vital signs were monitored continuously by radiology nursing throughout the procedure under my direct supervision. Total monitored sedation time: 10 minutes FLUOROSCOPY TIME:  None COMPLICATIONS: None immediate.  Estimated blood loss: <25 mL PROCEDURE: Informed written consent was obtained from the patient after a thorough discussion of the procedural risks, benefits and alternatives. All questions were addressed. Maximal Sterile Barrier Technique was utilized including caps, mask, sterile gowns, sterile gloves, sterile drape, hand hygiene and skin antiseptic. A timeout was performed prior to the initiation of the procedure. The patient was positioned prone and non-contrast localization CT was performed of the pelvis to demonstrate the iliac marrow spaces. Maximal barrier sterile technique utilized including caps, mask, sterile gowns, sterile gloves, large sterile drape, hand hygiene, and betadine prep. Under sterile conditions and local anesthesia, an 11 gauge coaxial bone biopsy needle was advanced into the right iliac marrow space. Needle position was confirmed with CT imaging. Initially, bone marrow aspiration was performed. Next, the 11 gauge outer cannula was utilized to obtain a right iliac bone marrow core biopsy. Needle was removed. Hemostasis was obtained with compression. The patient tolerated the procedure well. Samples were prepared with the cytotechnologist. IMPRESSION: Technically successful CT-guided bone marrow biopsy and aspiration. Signed, Criselda Peaches, MD, [facility] Electronically Signed   By: Jacqulynn Cadet M.D.   On: 01/16/2019 10:58   DG Swallowing Func-Speech Pathology  Result Date: 01/16/2019 Objective Swallowing Evaluation: Type of Study: Bedside Swallow Evaluation  Patient Details Name: Jesus [name] MRN: [id number] Date of Birth: 11-04-1955 Today's Date: 01/16/2019 Time: SLP Start Time (ACUTE ONLY): 1305 -SLP Stop Time (ACUTE ONLY): 1335 SLP Time Calculation (min) (ACUTE ONLY): 30 min Past Medical History: Past Medical History: Diagnosis Date  Arthritis   Cancer ([place])   multiple myeloma  Cervical stenosis of  spinal canal   Wears glasses  Past Surgical History: Past Surgical History: Procedure Laterality Date  ANTERIOR CERVICAL CORPECTOMY N/A 11/04/2018  Procedure: Cervical three Corpectomy with Cervical two to Cervical four Plating;  Surgeon: Erline Levine, MD;  Location: [facility];  Service: Neurosurgery;  Laterality: N/A;  APPENDECTOMY    BACK SURGERY    EYE SURGERY Bilateral   cataract surgery with lens implants  HERNIA REPAIR Left   inguinal   IR  IMAGING GUIDED PORT INSERTION  06/21/2018  ROTATOR CUFF REPAIR Left  HPI: Patient is a 63 y.o. male with PMH: multiple myeloma, arthritis, cervical stenosis, who presented to hospital with nosebleed which was not able to be controlled at home despite attempts with pressure, paper towels and ice. He had similar episodes two weeks prior where bleeding lasted 8 hours before it finally stopped. Patient has had poor appetite for past couple weeks. Wife reported he seemed more confused in AM of admission. on 12/11, RN reported patient with significant difficulty with swallowing and unable to take medications and so SLP BSE ordered.  Pt underwent BSE on 01/14/2019 and was found to be too high risk for aspiration with recommendation for npo.  Subjective: pt awake in chair in flouro suite Assessment / Plan / Recommendation CHL IP CLINICAL IMPRESSIONS 01/16/2019 Clinical Impression Pt presents with minimal oropharyngeal dysphagia without aspiration or laryngeal penetration of any consistency tested.  In general, pt's swallow is strong and timely with adequate airway protection even with sequential liquid bolus swallows.  He did demonstrate difficulty orally transiting tablet with thin barium - and after 3 unsuccesful attempts, pudding effective to transit.  In addition, decreased UES relaxation noted when swallowing masticated cracker which results in residuals without pt awareness.  Liquid swallow faciliated clearance but also resulted in trace backflow, thus recommend strict  esophageal precautions.  Given pt's generalized weakness and decreased UES clearance with solids (not sensate) - dys3 diet advised at this time.  Medicine with puree- whole - starting and following with liquids.  Of note, pt did NOT cough during entire MBS - symptoms of cough with liquids reported prior to admission and during hospitalization.  SLP educated pt and his wife (present for test) to findings/recommendations.  Will follow up for dysphagia management/treatment. Anticipate pt will benefit from dysphagia treatment given his XRT to pharyngeal region. SLP Visit Diagnosis Dysphagia, oropharyngeal phase (R13.12) Attention and concentration deficit following -- Frontal lobe and executive function deficit following -- Impact on safety and function Mild aspiration risk   CHL IP TREATMENT RECOMMENDATION 01/16/2019 Treatment Recommendations Therapy as outlined in treatment plan below   Prognosis 01/16/2019 Prognosis for Safe Diet Advancement Good Barriers to Reach Goals -- Barriers/Prognosis Comment -- CHL IP DIET RECOMMENDATION 01/16/2019 SLP Diet Recommendations Dysphagia 3 (Mech soft) solids;Thin liquid Liquid Administration via Cup;Straw Medication Administration Whole meds with puree Compensations Small sips/bites;Slow rate;Effortful swallow;Minimize environmental distractions Postural Changes --   CHL IP OTHER RECOMMENDATIONS 01/16/2019 Recommended Consults -- Oral Care Recommendations Oral care BID Other Recommendations Clarify dietary restrictions   CHL IP FOLLOW UP RECOMMENDATIONS 01/16/2019 Follow up Recommendations Skilled Nursing facility   [facility] IP FREQUENCY AND DURATION 01/16/2019 Speech Therapy Frequency (ACUTE ONLY) min 2x/week Treatment Duration 2 weeks      CHL IP ORAL PHASE 01/16/2019 Oral Phase Impaired Oral - Pudding Teaspoon -- Oral - Pudding Cup -- Oral - Honey Teaspoon -- Oral - Honey Cup -- Oral - Nectar Teaspoon -- Oral - Nectar Cup WFL Oral - Nectar Straw -- Oral - Thin Teaspoon -- Oral -  Thin Cup WFL Oral - Thin Straw WFL Oral - Puree WFL Oral - Mech Soft WFL Oral - Regular -- Oral - Multi-Consistency -- Oral - Pill Reduced posterior propulsion;Weak lingual manipulation Oral Phase - Comment pt needed pudding to orally transit tablet after unable x3 with liquids  CHL IP PHARYNGEAL PHASE 01/16/2019 Pharyngeal Phase WFL;Impaired Pharyngeal- Pudding Teaspoon -- Pharyngeal -- Pharyngeal- Pudding Cup -- Pharyngeal -- Pharyngeal- Honey  Teaspoon -- Pharyngeal -- Pharyngeal- Honey Cup -- Pharyngeal -- Pharyngeal- Nectar Teaspoon -- Pharyngeal -- Pharyngeal- Nectar Cup [facility] Pharyngeal Material does not enter airway Pharyngeal- Nectar Straw -- Pharyngeal -- Pharyngeal- Thin Teaspoon -- Pharyngeal -- Pharyngeal- Thin Cup [facility] Pharyngeal Material does not enter airway Pharyngeal- Thin Straw WFL Pharyngeal Material does not enter airway Pharyngeal- Puree WFL Pharyngeal Material does not enter airway Pharyngeal- Mechanical Soft Pharyngeal residue - cp segment Pharyngeal Material does not enter airway Pharyngeal- Regular NT Pharyngeal -- Pharyngeal- Multi-consistency -- Pharyngeal -- Pharyngeal- Pill WFL Pharyngeal Material does not enter airway Pharyngeal Comment --  CHL IP CERVICAL ESOPHAGEAL PHASE 01/16/2019 Cervical Esophageal Phase Impaired Pudding Teaspoon -- Pudding Cup -- Honey Teaspoon -- Honey Cup -- Nectar Teaspoon -- Nectar Cup WFL Nectar Straw -- Thin Teaspoon -- Thin Cup WFL Thin Straw Reduced cricopharyngeal relaxation;Esophageal backflow into cervical esophagus Puree WFL Mechanical Soft Reduced cricopharyngeal relaxation;Esophageal backflow into cervical esophagus Regular -- Multi-consistency -- Pill WFL Cervical Esophageal Comment pt without sensation to minimal residuals at pyriform/above UES region, liquid assisted clearance but also resulted in trace backflow, barium tablet swallowed with pudding appeared to transit into sttomach without delay Kathleen Lime, MS [facility] SLP Acute Rehab Services Office  [phone] [name] 01/16/2019, 2:19 PM              CT BONE MARROW BIOPSY & ASPIRATION  Result Date: 01/16/2019 INDICATION: 63 year old male with a history of multiple myeloma status post stem cell transplant and chemotherapy. He presents with pancytopenia and severe hypercalcemia. He requires bone marrow biopsy to evaluate for progression of disease. EXAM: CT GUIDED BONE MARROW ASPIRATION AND CORE BIOPSY Interventional Radiologist:  Criselda Peaches, MD MEDICATIONS: None. ANESTHESIA/SEDATION: Moderate (conscious) sedation was employed during this procedure. A total of 2 milligrams versed and 100 micrograms fentanyl were administered intravenously. The patient's level of consciousness and vital signs were monitored continuously by radiology nursing throughout the procedure under my direct supervision. Total monitored sedation time: 10 minutes FLUOROSCOPY TIME:  None COMPLICATIONS: None immediate. Estimated blood loss: <25 mL PROCEDURE: Informed written consent was obtained from the patient after a thorough discussion of the procedural risks, benefits and alternatives. All questions were addressed. Maximal Sterile Barrier Technique was utilized including caps, mask, sterile gowns, sterile gloves, sterile drape, hand hygiene and skin antiseptic. A timeout was performed prior to the initiation of the procedure. The patient was positioned prone and non-contrast localization CT was performed of the pelvis to demonstrate the iliac marrow spaces. Maximal barrier sterile technique utilized including caps, mask, sterile gowns, sterile gloves, large sterile drape, hand hygiene, and betadine prep. Under sterile conditions and local anesthesia, an 11 gauge coaxial bone biopsy needle was advanced into the right iliac marrow space. Needle position was confirmed with CT imaging. Initially, bone marrow aspiration was performed. Next, the 11 gauge outer cannula was utilized to obtain a right iliac bone marrow  core biopsy. Needle was removed. Hemostasis was obtained with compression. The patient tolerated the procedure well. Samples were prepared with the cytotechnologist. IMPRESSION: Technically successful CT-guided bone marrow biopsy and aspiration. Signed, Criselda Peaches, MD, [facility] Electronically Signed   By: Jacqulynn Cadet M.D.   On: 01/16/2019 10:58    ASSESSMENT & PLAN:   63 y.o. male with  1. Multiple Myeloma - high risk with 17p deletion July 2018 BM Bx revealed 20% monoclonal plasma cells July 2018 Cytogenetics revealed a 17p deletion July 2018 Initial M spike  at 1.5g with IgG Lambda specificity, K:L ratio of 0.27. IgG at 2220. 08/11/16 PET/CT revealed Hypermetabolic large soft tissue mass in the lower thoracic paraspinal region associated with lytic destruction of T10 vertebral body, concerning for multiple myeloma. Additional smaller lytic lesions involving the skeleton. S/p RT x 4 fractions, discontinued due to T10 pathologic fracture with severe cord compression S/p 08/26/16 T10 corpectomy and T8-L1 posterior spinal fusion   Began 7 cycles of VRd on 09/24/16 S/p autologous stem cell transplant, Day 0 on 02/23/17 04/21/17 BM Bx with residual less than 5% lambda light chain restricted plasma cells in the bone marrow. M Spike at 0.4g Began maintenance 48m/m2 Carfilzomib every 2 weeks on 06/16/17  06/09/18 PET/CT revealed "Multifocal hypermetabolic lytic lesions in the skeleton as noted above, compatible with active myeloma. In the spine, the most notable active lesion of concern is at the T12 level with there is a large right eccentric vertebral body lesion with demineralization of the cortex and likely some paravertebral extension of tumor. This could cause loosening of the right pedicle screw. Intraspinal extension of tumor is difficult to exclude. MRI might be considered although the posterolateral rod and pedicle  screws may cause artifact. 2. There is likewise cortical breakthrough associated with the lytic lesion along the left upper acetabulum. 3. A lesion in the left intertrochanteric area of the femur is large enough to potentially cause biomechanical weakening which increases risk of fracture. 4. Other skeletal sites of active involvement are detailed above in the skeleton section. 5. Several lucent lesions are observed including the calvarium, T1 vertebral body, and left L4 pedicle which are not hypermetabolic and may represent effectively previously treated lesions. 6. 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 There is also a lipoma along the right brachialis muscle. 7. Nonspecific subcutaneous edema especially in the right forearm but also extending up into the right upper arm. Cause is uncertain. There is some low-grade nonfocal metabolic activity along this area. Strictly speaking, cellulitis is not excluded, correlate with clinical history and visual inspection. 8. Other imaging findings of potential clinical significance: Aortic Atherosclerosis. Mild cardiomegaly. Cholelithiasis. Prostatomegaly."  06/17/18 MMP revealed M Protein at 0.5g, just before beginning C1 Daratumumab  06/30/18 BM Biopsy revealed normocellular marrow with minimal involvement by plasma cell neoplasm (5% plasma cells), normal cytogenetics.  06/30/18 Molecular Cytogenetics did not have enough material for testing.  10/13/2018 PET whole body revealed "1. Mixed response to therapy with some lesions decreased significantly in metabolic activity and others increased in metabolic activity as well size on the CT portion exam. 2. Expansion of lytic lesion at C3 with loss of a large portion of the vertebral body structure and intense metabolic activity. Recommend non emergent neuro surgical consultation for evaluation of this destructive C3 lesion. Dedicated CT or MRI of  the neck may also provide more information. 3. Increase in size and metabolic activity of RIGHT iliac lesion and T5 vertebral body lesion. 4. Marked improvement with reduction in metabolic activity of RIGHT humerus and scapular metastatic lesions as well as marked reduction in metabolic activity large LEFT iliac bone lesion. 5. No evidence of soft tissue plasmacytoma or metastasis."  #2 severe hypercalcemia likely due to progression of his multiple myeloma.  Mild confusion and significant dehydration.  #3 severe uncontrolled epistaxis likely from dry mucous membranes plus severe thrombocytopenia   PLAN: -Discussed lab work today with the patient and his wife who was at  the bedside. -Calcium levels a little better after additional dose of dexamethasone yesterday. -Still needing transfusion support with PRBC and platelet transfusions as needed every few days. -No more epistaxis noted. -I discussed the bone marrow biopsy results in details with the patient showing progression of multiple myeloma but 5 to 10% abnormal clonal plasma cells.  Also concerns with presence of myelodysplastic syndrome which is complicating his cytopenias. -PT OT evaluations to determine his functional status. -Dietary consultation to determine ability to sustain himself. -Discussed goals of care in detail with the patient with his wife at bedside. -Discussed treatment limitations given his functional status cytopenias and aggressive nature of his myeloma. -Patient and wife want to try to see if he can sustain himself and to see if his blood counts and functional status improved to be able to continue myeloma treatments. -I discussed that consideration of comfort only approach would also be reasonable. -Unless the patient chooses a comfort care approach he will need to have complete mental clearance, and ability to function with home care services and stable calcium levels and relative count stability to be able to discharge  home. -He was to follow-up with Dr. Norma Fredrickson at [facility] to consider clinical trial options including 1 with ibrutinib/Revlimid/dexamethasone.  These options might change since there has been a significant change in the patient's functional status. -Out of bed as much as possible. -Appreciate hospitalist cares. -We will continue to follow alongside as needed. -Transfuse as needed for hemoglobin less than 7.5 and platelets less than 10k or if bleeding. -Might need to consider G-CSF support if the patient develops any signs of infection with his neutropenia.  Sullivan Lone MD MS Total time spent 60 minutes more than 50% of time on direct patient contact counseling and coordination of care.

## 2019-01-20 NOTE — Evaluation (Signed)
Physical Therapy Evaluation Patient Details Name: Jesus [name] MRN: [id number] DOB: 02-10-1955 Today's Date: 01/20/2019   History of Present Illness  Pt is 63 year old male with history of multiple myeloma, arthritis, cervical stenosis who presented with nosebleed.  Pt was found to have severe thrombocytopenia, normocytic anemia, and hypercalcemia secondary to multiple myeloma.  Today plateltes are 25, Calcium is 11.9, and WBC are 1.3.  Clinical Impression  Pt admitted with above diagnosis. Pt with lethargy and jerking/buckling motions that limited safety and mobility today. He demonstrated good strength, and initiated transfers and gait with min A but required max A to maintain standing during buckling episode.  Wife reports pt lethargic and with these motions when calcium elevated.  Pt was mobile prior to admission and expected to progress well with PT.  Pt currently with functional limitations due to the deficits listed below (see PT Problem List). Pt will benefit from skilled PT to increase their independence and safety with mobility to allow discharge to the venue listed below.       Follow Up Recommendations Home health PT((likely will progress to HHPT ))    Equipment Recommendations  Rolling walker with 5" wheels    Recommendations for Other Services       Precautions / Restrictions Precautions Precautions: Fall Precaution Comments: Check lab values - low platelets and high calcium Restrictions Weight Bearing Restrictions: No      Mobility  Bed Mobility Overal bed mobility: Needs Assistance Bed Mobility: Sit to Supine       Sit to supine: Min assist   General bed mobility comments: in chair at arrival  Transfers Overall transfer level: Needs assistance Equipment used: Rolling walker (2 wheeled) Transfers: Sit to/from Stand Sit to Stand: Min assist            Ambulation/Gait Ambulation/Gait assistance: Min guard;Max assist Gait Distance (Feet): 2  Feet Assistive device: Rolling walker (2 wheeled) Gait Pattern/deviations: Decreased stride length Gait velocity: decreased   General Gait Details: Pt was able to take side steps to bed with min guard; however, pt having jerking motions in which knees buckled and he required Max A to maintain standing during this episode  Stairs            Wheelchair Mobility    Modified Rankin (Stroke Patients Only)       Balance Overall balance assessment: Needs assistance Sitting-balance support: Bilateral upper extremity supported;Feet supported Sitting balance-Leahy Scale: Fair     Standing balance support: Bilateral upper extremity supported;During functional activity Standing balance-Leahy Scale: Poor Standing balance comment: limited by jerking/knee  buckles                             Pertinent Vitals/Pain Pain Assessment: No/denies pain    Home Living Family/patient expects to be discharged to:: Private residence Living Arrangements: Spouse/significant other Available Help at Discharge: Available 24 hours/day;Family Type of Home: House Home Access: Stairs to enter Entrance Stairs-Rails: None Entrance Stairs-Number of Steps: 1, no railings but can hold onto a pillar Home Layout: Two level;Able to live on main level with bedroom/bathroom Home Equipment: Kasandra Knudsen - single point      Prior Function Level of Independence: Independent with assistive device(s)               Hand Dominance        Extremity/Trunk Assessment   Upper Extremity Assessment Upper Extremity Assessment: Overall WFL for tasks assessed    Lower  Extremity Assessment Lower Extremity Assessment: Overall WFL for tasks assessed    Cervical / Trunk Assessment Cervical / Trunk Assessment: Normal  Communication   Communication: No difficulties  Cognition Arousal/Alertness: Lethargic Behavior During Therapy: WFL for tasks assessed/performed Overall Cognitive Status:  Impaired/Different from baseline Area of Impairment: Following commands                       Following Commands: Follows one step commands with increased time       General Comments: Cognitive status limited by lethargy: pt with minimal responses and following minimal commands: wife reports pt like this when calcium elevated      General Comments General comments (skin integrity, edema, etc.): Pt with occasional jerking in extremities and trunk at rest - wife reports pt has this when calcium elevated.  She stated he was able to get up and to chair with min A earlier without buckling.    Exercises     Assessment/Plan    PT Assessment Patient needs continued PT services  PT Problem List Decreased strength;Decreased mobility;Decreased safety awareness;Decreased activity tolerance;Decreased balance;Decreased cognition;Decreased knowledge of use of DME       PT Treatment Interventions DME instruction;Therapeutic activities;Gait training;Therapeutic exercise;Patient/family education;Stair training;Functional mobility training;Balance training    PT Goals (Current goals can be found in the Care Plan section)  Acute Rehab PT Goals Patient Stated Goal: return home-per wife; pt unable PT Goal Formulation: With family Time For Goal Achievement: 02/03/19 Potential to Achieve Goals: Good    Frequency Min 3X/week   Barriers to discharge        Co-evaluation               AM-PAC PT "6 Clicks" Mobility  Outcome Measure Help needed turning from your back to your side while in a flat bed without using bedrails?: None Help needed moving from lying on your back to sitting on the side of a flat bed without using bedrails?: A Little Help needed moving to and from a bed to a chair (including a wheelchair)?: A Lot Help needed standing up from a chair using your arms (e.g., wheelchair or bedside chair)?: A Little Help needed to walk in hospital room?: A Lot Help needed climbing  3-5 steps with a railing? : Total 6 Click Score: 15    End of Session Equipment Utilized During Treatment: Gait belt Activity Tolerance: Patient limited by lethargy Patient left: in bed;with family/visitor present;with bed alarm set;with call bell/phone within reach Nurse Communication: Mobility status(Use of Gait Belt) PT Visit Diagnosis: Unsteadiness on feet (R26.81);Other abnormalities of gait and mobility (R26.89)    Time: 8242-3536 PT Time Calculation (min) (ACUTE ONLY): 23 min   Charges:   PT Evaluation $PT Eval Moderate Complexity: 1 Mod          Maggie Font, PT Acute Rehab Services Pager [phone] [facility] [phone] [facility] [phone]   Karlton Lemon 01/20/2019, 5:52 PM

## 2019-01-20 NOTE — Progress Notes (Signed)
PROGRESS NOTE    Jesus [name]  YWV:[id number] DOB: 08-02-1955 DOA: 01/11/2019 PCP: Patient, No Pcp Per    Brief Narrative:  Patient is a 63 year old male with history of multiple myeloma, arthritis, cervical stenosis who presented with nosebleed.  He had a similar episode about 2 weeks ago which lasted for 8 hours but spontaneously resolved.  He follows with oncologist at [place] to have severe thrombocytopenia at presentation and also has normocytic anemia.  Hematology/oncology consulted.  Nosebleed has stopped.  Being transfused today for anemia.  Also found to have hypercalcemia secondary to MM and was given a dose of Zometa and Decadron.  Assessment & Plan:   Active Problems:   Hypercalcemia   Epistaxis   Thrombocytopenia (HCC)   Anemia   Leukopenia   # Multiple myeloma: Follows with Dr Jesus [name] and also Dr. Blinda [name] ,,oncology, at [facility]  Has history of recent C3 compression fracture status post corpectomy, C2-C4 plating.  Oncology following, suggest bone marrow biopsy by IR , completed 12/11, follow up results. Bone scan: Uptake over the right acetabulum, mid to lower thoracic spine, upper anterior ribs and possibly cervicothoracic junction likely representing patient's known multiple myeloma.  Patient has history of an aggressive 17P mutated multiple myeloma currently on daratumumab  Bone marrow biopys report - There is a mild increased in monotypic plasma cells (5-10%) consistent  with persistent involvement by the patient's plasma cell myeloma. There  is mild myeloid dysplasia raising the question of an underlying  myelodysplastic syndrome and FISH for MDS has been ordered.   Oncology input appreciated.  Unless the patient chooses a comfort care approach he will need to have complete mental clearance, and ability to function with home care services and stable calcium levels and relative count stability to be able to discharge home. -He was to  follow-up with Dr. Norma [name] at [facility] to consider clinical trial options including 1 with ibrutinib/Revlimid/dexamethasone.  These options might change since there has been a significant change in the patient's functional status. -Transfuse as needed for hemoglobin less than 7.5 and platelets less than 10k or if bleeding. -Might need to consider G-CSF support if the patient develops any signs of infection with his neutropenia.  We will consult palliative care As per my discussion with patient and his wife on 01/20/2019, they are currently not amenable to comfort care measures.  They want to try and exhaust all supportive treatment for now.   Hypercalcemia: Secondary to malignancy.  Calcium level still elevated..  Continue aggressive  IV fluids.  Given Zometa and Decadron, calcitonin - last dose on 12/16  Nephrology consulted IV fluid switched to 0.45 normal saline due to hypernatremia   Hypokalemia  - replete as needed  Hypernatremia: 0.45 ns  @ 125 cc/hr, recheck labs  Pancytopenia: Secondary to malignancy.  Hemoglobin this morning 8.6.  S/p 1 PRBC.  Also transfused with a unit of  platelets.  Transfuse platelets if is less than 20000 and if bleeding. Recheck cbc  Nosebleed: Secondary to severe thrombocytopenia.  Stopped.last episode on 12/18   Severe protein calorie malnutrition: Will request for dietician  evaluation. Started on liquid diet    Neutropenia -  Patient is currently afebrile.  Will defer further management to oncology  Constipation-we will give Colace  DVT prophylaxis:SCD Code Status: Full Family Communication:  discussed with wife on 12/18  Disposition Plan: Home after full work-up, improvement in the pancytopenia  Consultants:  Oncology, nephrology Procedures:  bone  marrow biopsy by IR   Antimicrobials: Anti-infectives (From admission, onward)   None      Subjective: Patient was seen and examined at bedside.  He appears comfortable,  hemodynamically stable,  Had a small amount of nosebleed earlier this morning when he was trying to blow his nose softly.  His epistaxis has stopped spontaneously.  nurse reported no overnight events.   Patient informed about his biopsy results. Able to tolerate liquid diet.  Sodium and calcium are somewhat elevated, white cell count and platelets are trending down   Objective: Vitals:   01/19/19 1447 01/19/19 2102 01/20/19 0500 01/20/19 0514  BP: 133/79 129/82  124/72  Pulse: 94 92  85  Resp: '20 18  18  ' Temp: 98.1 F (36.7 C) 98.2 F (36.8 C)  98.4 F (36.9 C)  TempSrc: Oral Oral  Oral  SpO2: 98% 98%  98%  Weight:   50 kg   Height:        Intake/Output Summary (Last 24 hours) at 01/20/2019 1837 Last data filed at 01/20/2019 0653 Gross per 24 hour  Intake 1702.29 ml  Output 2300 ml  Net -597.71 ml   Filed Weights   01/15/19 0524 01/17/19 0500 01/20/19 0500  Weight: 49.7 kg 49.5 kg 50 kg    Examination:  General exam: Appears calm and comfortable, thin built,  appears generally weak. Respiratory system: Clear to auscultation. Respiratory effort normal. Cardiovascular system: S1 & S2 heard, RRR. No JVD, murmurs, rubs, gallops or clicks. No pedal edema. Gastrointestinal system: Abdomen is nondistended, soft and nontender. No organomegaly or masses felt. Normal bowel sounds heard. Central nervous system: Alert and oriented. No focal neurological deficits. Extremities: No edema, no clubbing Skin: No rashes, lesions or ulcers Psychiatry: Judgement and insight appear normal. Mood & affect appropriate.     Data Reviewed: I have personally reviewed following labs and imaging studies  CBC: Recent Labs  Lab 01/16/19 0526 01/17/19 0559 01/18/19 0407 01/19/19 0249 01/20/19 0420  WBC 1.4* 1.1* 1.0* 2.1* 1.3*  NEUTROABS 0.7*  --   --   --   --   HGB 8.7* 7.8* 8.3* 8.1* 7.6*  HCT 26.4* 24.1* 25.6* 25.1* 23.6*  MCV 87.7 87.0 88.9 89.0 88.4  PLT 10* 61* 48* 36* 25*    Basic Metabolic Panel: Recent Labs  Lab 01/15/19 0442 01/15/19 1744 01/16/19 0526 01/17/19 0559 01/18/19 0407 01/18/19 1605 01/19/19 0249 01/20/19 0420  NA 152*  153*   < > 134* 138 147*  147* 142 136 146*  K 3.1*  3.1*  --  2.8* 3.4* 3.6  3.6 3.5 3.7 3.1*  CL 119*  119*   < > 104 108 117*  116* 112* 106 113*  CO2 26  27   < > '23 26 26  26 25 23 29  ' GLUCOSE 163*  165*   < > 127* 101* 104*  103* 125* 163* 99  BUN 23  22   < > '11 12 18  18 14 16 16  ' CREATININE 1.05  1.06   < > 0.86 0.93 0.93  0.90 0.84 0.74 0.85  CALCIUM 12.9*  13.0*   < > 11.4* 11.0* 12.5*  12.5* 12.5* 11.1* 11.9*  MG 1.7  --  1.1* 1.8 1.8  --  1.4* 1.6*  PHOS 2.1*  --  1.5* 4.3 4.1  --  2.3*  --    < > = values in this interval not displayed.   GFR: Estimated Creatinine Clearance: 62.9 mL/min (by  C-G formula based on SCr of 0.85 mg/dL). Liver Function Tests: Recent Labs  Lab 01/14/19 0431 01/15/19 0442 01/17/19 0559 01/18/19 0407  AST 56* 36  --   --   ALT 54* 40  --   --   ALKPHOS 132* 123  --   --   BILITOT 0.8 0.5  --   --   PROT 7.1 7.2  --   --   ALBUMIN 2.9* 2.9*  2.7* 2.6* 2.7*   No results for input(s): LIPASE, AMYLASE in the last 168 hours. No results for input(s): AMMONIA in the last 168 hours. Coagulation Profile: No results for input(s): INR, PROTIME in the last 168 hours. Cardiac Enzymes: No results for input(s): CKTOTAL, CKMB, CKMBINDEX, TROPONINI in the last 168 hours. BNP (last 3 results) No results for input(s): PROBNP in the last 8760 hours. HbA1C: No results for input(s): HGBA1C in the last 72 hours. CBG: No results for input(s): GLUCAP in the last 168 hours. Lipid Profile: No results for input(s): CHOL, HDL, LDLCALC, TRIG, CHOLHDL, LDLDIRECT in the last 72 hours. Thyroid Function Tests: No results for input(s): TSH, T4TOTAL, FREET4, T3FREE, THYROIDAB in the last 72 hours. Anemia Panel: No results for input(s): VITAMINB12, FOLATE, FERRITIN, TIBC, IRON,  RETICCTPCT in the last 72 hours. Sepsis Labs: No results for input(s): PROCALCITON, LATICACIDVEN in the last 168 hours.  Recent Results (from the past 240 hour(s))  SARS CORONAVIRUS 2 (TAT 6-24 HRS) Nasopharyngeal Nasopharyngeal Swab     Status: None   Collection Time: 01/11/19  3:32 PM   Specimen: Nasopharyngeal Swab  Result Value Ref Range Status   SARS Coronavirus 2 NEGATIVE NEGATIVE Final    Comment: (NOTE) SARS-CoV-2 target nucleic acids are NOT DETECTED. The SARS-CoV-2 RNA is generally detectable in upper and lower respiratory specimens during the acute phase of infection. Negative results do not preclude SARS-CoV-2 infection, do not rule out co-infections with other pathogens, and should not be used as the sole basis for treatment or other patient management decisions. Negative results must be combined with clinical observations, patient history, and epidemiological information. The expected result is Negative. Fact Sheet for Patients: SugarRoll.be Fact Sheet for Healthcare Providers: https://www.woods-mathews.com/ This test is not yet approved or cleared by the [place] FDA and  has been authorized for detection and/or diagnosis of SARS-CoV-2 by FDA under an Emergency Use Authorization (EUA). This EUA will remain  in effect (meaning this test can be used) for the duration of the COVID-19 declaration under Section 56 4(b)(1) of the Act, 21 U.S.C. section 360bbb-3(b)(1), unless the authorization is terminated or revoked sooner. Performed at [facility], [place] [address], [place], [place] [address]    Radiology Studies: No results found. Scheduled Meds: . sodium chloride   Intravenous Once  . sodium chloride   Intravenous Once  . sodium chloride   Intravenous Once  . Chlorhexidine Gluconate Cloth  6 each Topical Daily  . docusate sodium  100 mg Oral BID  . dronabinol  10 mg Oral BID  . feeding supplement  1 Container  Oral TID BM  . feeding supplement (PRO-STAT SUGAR FREE 64)  30 mL Oral BID  . gabapentin  300 mg Oral TID  . morphine  30 mg Oral Q12H  . oxymetazoline  1 spray Each Nare BID  . potassium chloride  40 mEq Oral Daily  . senna-docusate  1 tablet Oral Daily  . sucralfate  1 g Oral TID AC & HS  . tranexamic acid  500 mg Topical Once   Continuous Infusions: . sodium chloride 150 mL/hr at 01/20/19 1255     LOS: 8 days    Time spent:    Vicenta Dunning, MD [facility]   If 7PM-7AM, please contact night-coverage

## 2019-01-20 NOTE — Progress Notes (Signed)
Patient ID: Jesus [name], male   DOB: 18-Aug-1955, 63 y.o.   MRN: [id number] S: Feeling well, no complaints. O:BP 124/72   Pulse 85   Temp 98.4 F (36.9 C) (Oral)   Resp 18   Ht '5\' 7"'  (1.702 m)   Wt 50 kg   SpO2 98%   BMI 17.26 kg/m   Intake/Output Summary (Last 24 hours) at 01/20/2019 1228 Last data filed at 01/20/2019 0653 Gross per 24 hour  Intake 2761.62 ml  Output 5500 ml  Net -2738.38 ml   Intake/Output: I/O last 3 completed shifts: In: 5764.7 [P.O.:826; I.V.:4938.7] Out: 8950 [Urine:8950]  Intake/Output this shift:  No intake/output data recorded. Weight change:  Gen: thin, male in NAD CVS: no rub Resp: cta Abd: +BS, soft, Nt/ND Ext: no edema  Recent Labs  Lab 01/14/19 0431 01/15/19 0442 01/15/19 1744 01/16/19 0526 01/16/19 1644 01/16/19 2342 01/17/19 0559 01/18/19 0407 01/18/19 1605 01/19/19 0249 01/20/19 0420  NA 151* 152*  153*   < > 134* 135 135 138 147*  147* 142 136 146*  K 4.1 3.1*  3.1*  --  2.8* 2.9* 3.2* 3.4* 3.6  3.6 3.5 3.7 3.1*  CL 119* 119*  119*   < > 104 102 104 108 117*  116* 112* 106 113*  CO2 '27 26  27   ' < > '23 24 22 26 26  26 25 23 29  ' GLUCOSE 105* 163*  165*   < > 127* 108* 114* 101* 104*  103* 125* 163* 99  BUN '23 23  22   ' < > '11 9 10 12 18  18 14 16 16  ' CREATININE 1.16 1.05  1.06   < > 0.86 0.76 0.80 0.93 0.93  0.90 0.84 0.74 0.85  ALBUMIN 2.9* 2.9*  2.7*  --   --   --   --  2.6* 2.7*  --   --   --   CALCIUM 13.6* 12.9*  13.0*   < > 11.4* 11.0* 10.9* 11.0* 12.5*  12.5* 12.5* 11.1* 11.9*  PHOS 2.3* 2.1*  --  1.5*  --   --  4.3 4.1  --  2.3*  --   AST 56* 36  --   --   --   --   --   --   --   --   --   ALT 54* 40  --   --   --   --   --   --   --   --   --    < > = values in this interval not displayed.   Liver Function Tests: Recent Labs  Lab 01/14/19 0431 01/15/19 0442 01/17/19 0559 01/18/19 0407  AST 56* 36  --   --   ALT 54* 40  --   --   ALKPHOS 132* 123  --   --   BILITOT 0.8 0.5  --   --    PROT 7.1 7.2  --   --   ALBUMIN 2.9* 2.9*  2.7* 2.6* 2.7*   No results for input(s): LIPASE, AMYLASE in the last 168 hours. No results for input(s): AMMONIA in the last 168 hours. CBC: Recent Labs  Lab 01/16/19 0526 01/17/19 0559 01/18/19 0407 01/19/19 0249 01/20/19 0420  WBC 1.4* 1.1* 1.0* 2.1* 1.3*  NEUTROABS 0.7*  --   --   --   --   HGB 8.7* 7.8* 8.3* 8.1* 7.6*  HCT 26.4* 24.1* 25.6* 25.1* 23.6*  MCV 87.7  87.0 88.9 89.0 88.4  PLT 10* 61* 48* 36* 25*   Cardiac Enzymes: No results for input(s): CKTOTAL, CKMB, CKMBINDEX, TROPONINI in the last 168 hours. CBG: No results for input(s): GLUCAP in the last 168 hours.  Iron Studies: No results for input(s): IRON, TIBC, TRANSFERRIN, FERRITIN in the last 72 hours. Studies/Results: No results found. . sodium chloride   Intravenous Once  . sodium chloride   Intravenous Once  . sodium chloride   Intravenous Once  . Chlorhexidine Gluconate Cloth  6 each Topical Daily  . docusate sodium  100 mg Oral BID  . dronabinol  10 mg Oral BID  . feeding supplement  1 Container Oral TID BM  . feeding supplement (PRO-STAT SUGAR FREE 64)  30 mL Oral BID  . gabapentin  300 mg Oral TID  . morphine  30 mg Oral Q12H  . oxymetazoline  1 spray Each Nare BID  . potassium chloride  40 mEq Oral Daily  . senna-docusate  1 tablet Oral Daily  . sucralfate  1 g Oral TID AC & HS  . tranexamic acid  500 mg Topical Once    BMET    Component Value Date/Time   NA 146 (H) 01/20/2019 0420   K 3.1 (L) 01/20/2019 0420   CL 113 (H) 01/20/2019 0420   CO2 29 01/20/2019 0420   GLUCOSE 99 01/20/2019 0420   BUN 16 01/20/2019 0420   CREATININE 0.85 01/20/2019 0420   CREATININE 0.76 12/30/2018 0940   CALCIUM 11.9 (H) 01/20/2019 0420   GFRNONAA >60 01/20/2019 0420   GFRNONAA >60 12/30/2018 0940   GFRAA >60 01/20/2019 0420   GFRAA >60 12/30/2018 0940   CBC    Component Value Date/Time   WBC 1.3 (LL) 01/20/2019 0420   RBC 2.67 (L) 01/20/2019 0420   HGB  7.6 (L) 01/20/2019 0420   HGB 8.5 (L) 12/30/2018 0940   HCT 23.6 (L) 01/20/2019 0420   PLT 25 (LL) 01/20/2019 0420   PLT 25 (L) 12/30/2018 0940   MCV 88.4 01/20/2019 0420   MCH 28.5 01/20/2019 0420   MCHC 32.2 01/20/2019 0420   RDW 19.7 (H) 01/20/2019 0420   LYMPHSABS 0.6 (L) 01/16/2019 0526   MONOABS 0.2 01/16/2019 0526   EOSABS 0.0 01/16/2019 0526   BASOSABS 0.0 01/16/2019 0526     Assessment/Plan:  1. 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 Received IV dexamethasone, calcitonin, xometa, calcitonin x 4 doses, and IVF's. 2. Calcium levels climbing again. Bone marrow biopsy consistent with persistent myeloma 3. Await Oncology input for alternative treatment options 2. Hypernatremia- will change back to 1/2NS and increase rate of infusion to 150 ml/min.  Pt is more awake and alert so should be able to improve free water intake 3. AMS- improving 4. AKI- resolved.  Donetta Potts, MD [employer] [phone]

## 2019-01-21 ENCOUNTER — Other Ambulatory Visit: Payer: Self-pay | Admitting: Oncology

## 2019-01-21 DIAGNOSIS — Z515 Encounter for palliative care: Secondary | ICD-10-CM

## 2019-01-21 LAB — CBC
HCT: 22.8 % — ABNORMAL LOW (ref 39.0–52.0)
Hemoglobin: 7.4 g/dL — ABNORMAL LOW (ref 13.0–17.0)
MCH: 29 pg (ref 26.0–34.0)
MCHC: 32.5 g/dL (ref 30.0–36.0)
MCV: 89.4 fL (ref 80.0–100.0)
Platelets: 17 10*3/uL — CL (ref 150–400)
RBC: 2.55 MIL/uL — ABNORMAL LOW (ref 4.22–5.81)
RDW: 19.9 % — ABNORMAL HIGH (ref 11.5–15.5)
WBC: 1.5 10*3/uL — ABNORMAL LOW (ref 4.0–10.5)
nRBC: 0 % (ref 0.0–0.2)

## 2019-01-21 LAB — BASIC METABOLIC PANEL
Anion gap: 5 (ref 5–15)
BUN: 16 mg/dL (ref 8–23)
CO2: 29 mmol/L (ref 22–32)
Calcium: 13 mg/dL — ABNORMAL HIGH (ref 8.9–10.3)
Chloride: 111 mmol/L (ref 98–111)
Creatinine, Ser: 0.87 mg/dL (ref 0.61–1.24)
GFR calc Af Amer: >60 mL/min (ref >60)
GFR calc non Af Amer: >60 mL/min (ref >60)
Glucose, Bld: 97 mg/dL (ref 70–99)
Potassium: 3.3 mmol/L — ABNORMAL LOW (ref 3.5–5.1)
Sodium: 145 mmol/L (ref 135–145)

## 2019-01-21 LAB — MAGNESIUM: Magnesium: 1.6 mg/dL — ABNORMAL LOW (ref 1.7–2.4)

## 2019-01-21 MED ORDER — ZOLEDRONIC ACID 4 MG/5ML IV CONC
4.0000 mg | Freq: Once | INTRAVENOUS | Status: AC
  Administered 2019-01-21: 4 mg via INTRAVENOUS
  Filled 2019-01-21: qty 5

## 2019-01-21 MED ORDER — WITCH HAZEL-GLYCERIN EX PADS
MEDICATED_PAD | CUTANEOUS | Status: DC | PRN
  Filled 2019-01-21: qty 100

## 2019-01-21 MED ORDER — CALCITONIN (SALMON) 200 UNIT/ACT NA SOLN: 1.0000 | Freq: Once | NASAL | Status: DC

## 2019-01-21 MED ORDER — ZOLEDRONIC ACID 4 MG/100ML IV SOLN
4.0000 mg | Freq: Once | INTRAVENOUS | Status: DC
  Filled 2019-01-21: qty 100

## 2019-01-21 MED ORDER — MAGNESIUM SULFATE 2 GM/50ML IV SOLN
2.0000 g | Freq: Once | INTRAVENOUS | Status: AC
  Administered 2019-01-21: 2 g via INTRAVENOUS
  Filled 2019-01-21: qty 50

## 2019-01-21 MED ORDER — SODIUM CHLORIDE 0.9% IV SOLUTION: Freq: Once | INTRAVENOUS | Status: AC

## 2019-01-21 MED ORDER — DEXAMETHASONE 4 MG PO TABS
8.0000 mg | ORAL_TABLET | Freq: Two times a day (BID) | ORAL | Status: AC
  Administered 2019-01-21 (×2): 8 mg via ORAL
  Filled 2019-01-21 (×2): qty 2

## 2019-01-21 MED ORDER — LACTATED RINGERS IV BOLUS
500.0000 mL | Freq: Once | INTRAVENOUS | Status: AC
  Administered 2019-01-21: 500 mL via INTRAVENOUS

## 2019-01-21 MED ORDER — DEXAMETHASONE 4 MG PO TABS
4.0000 mg | ORAL_TABLET | Freq: Two times a day (BID) | ORAL | Status: DC
  Administered 2019-01-22 – 2019-01-23 (×3): 4 mg via ORAL
  Filled 2019-01-21 (×3): qty 1

## 2019-01-21 NOTE — Progress Notes (Signed)
Updated patient's wife on current clinical status. All questions answered.

## 2019-01-21 NOTE — Progress Notes (Signed)
Patient ID: Jesus [name], male   DOB: [birth year], 63 y.o.   MRN: [id number] S: No new complaints O:BP 136/73   Pulse 99   Temp 98.2 F (36.8 C) (Oral)   Resp 17   Ht 5' 7.01" (1.702 m)   Wt 52.9 kg   SpO2 100%   BMI 18.26 kg/m   Intake/Output Summary (Last 24 hours) at 01/21/2019 1109 Last data filed at 01/21/2019 1009 Gross per 24 hour  Intake 4394.33 ml  Output 6650 ml  Net -2255.67 ml   Intake/Output: I/O last 3 completed shifts: In: 6080.6 [P.O.:2066; I.V.:4014.6] Out: 7750 [Urine:7750]  Intake/Output this shift:  Total I/O In: 370 [Blood:370] Out: 1800 [Urine:1800] Weight change: 1.3 kg Gen: chronically ill-appearing AAM in NAD CVS: no rub Resp: cta Abd: benign Ext: no edema  Recent Labs  Lab 01/15/19 0442 01/15/19 1744 01/16/19 0526 01/16/19 2342 01/17/19 0559 01/18/19 0407 01/18/19 1605 01/19/19 0249 01/20/19 0420 01/21/19 0439  NA 152*  153*   < > 134* 135 138 147*  147* 142 136 146* 145  K 3.1*  3.1*  --  2.8* 3.2* 3.4* 3.6  3.6 3.5 3.7 3.1* 3.3*  CL 119*  119*   < > 104 104 108 117*  116* 112* 106 113* 111  CO2 26  27   < > '23 22 26 26  26 25 23 29 29  ' GLUCOSE 163*  165*   < > 127* 114* 101* 104*  103* 125* 163* 99 97  BUN 23  22   < > '11 10 12 18  18 14 16 16 16  ' CREATININE 1.05  1.06   < > 0.86 0.80 0.93 0.93  0.90 0.84 0.74 0.85 0.87  ALBUMIN 2.9*  2.7*  --   --   --  2.6* 2.7*  --   --   --   --   CALCIUM 12.9*  13.0*   < > 11.4* 10.9* 11.0* 12.5*  12.5* 12.5* 11.1* 11.9* 13.0*  PHOS 2.1*  --  1.5*  --  4.3 4.1  --  2.3*  --   --   AST 36  --   --   --   --   --   --   --   --   --   ALT 40  --   --   --   --   --   --   --   --   --    < > = values in this interval not displayed.   Liver Function Tests: Recent Labs  Lab 01/15/19 0442 01/17/19 0559 01/18/19 0407  AST 36  --   --   ALT 40  --   --   ALKPHOS 123  --   --   BILITOT 0.5  --   --   PROT 7.2  --   --   ALBUMIN 2.9*  2.7* 2.6* 2.7*   No results for  input(s): LIPASE, AMYLASE in the last 168 hours. No results for input(s): AMMONIA in the last 168 hours. CBC: Recent Labs  Lab 01/16/19 0526 01/17/19 0559 01/18/19 0407 01/19/19 0249 01/20/19 0420 01/21/19 0439  WBC 1.4* 1.1* 1.0* 2.1* 1.3* 1.5*  NEUTROABS 0.7*  --   --   --   --   --   HGB 8.7* 7.8* 8.3* 8.1* 7.6* 7.4*  HCT 26.4* 24.1* 25.6* 25.1* 23.6* 22.8*  MCV 87.7 87.0 88.9 89.0 88.4 89.4  PLT 10* 61* 48* 36*  25* 17*   Cardiac Enzymes: No results for input(s): CKTOTAL, CKMB, CKMBINDEX, TROPONINI in the last 168 hours. CBG: No results for input(s): GLUCAP in the last 168 hours.  Iron Studies: No results for input(s): IRON, TIBC, TRANSFERRIN, FERRITIN in the last 72 hours. Studies/Results: No results found. . sodium chloride   Intravenous Once  . sodium chloride   Intravenous Once  . sodium chloride   Intravenous Once  . Chlorhexidine Gluconate Cloth  6 each Topical Daily  . dexamethasone  8 mg Oral Q12H   Followed by  . [START ON 01/22/2019] dexamethasone  4 mg Oral Q12H  . docusate sodium  100 mg Oral BID  . dronabinol  10 mg Oral BID  . feeding supplement  1 Container Oral TID BM  . feeding supplement (PRO-STAT SUGAR FREE 64)  30 mL Oral BID  . gabapentin  300 mg Oral TID  . morphine  30 mg Oral Q12H  . oxymetazoline  1 spray Each Nare BID  . potassium chloride  40 mEq Oral Daily  . senna-docusate  1 tablet Oral Daily  . sucralfate  1 g Oral TID AC & HS  . tranexamic acid  500 mg Topical Once    BMET    Component Value Date/Time   NA 145 01/21/2019 0439   K 3.3 (L) 01/21/2019 0439   CL 111 01/21/2019 0439   CO2 29 01/21/2019 0439   GLUCOSE 97 01/21/2019 0439   BUN 16 01/21/2019 0439   CREATININE 0.87 01/21/2019 0439   CREATININE 0.76 12/30/2018 0940   CALCIUM 13.0 (H) 01/21/2019 0439   GFRNONAA >60 01/21/2019 0439   GFRNONAA >60 12/30/2018 0940   GFRAA >60 01/21/2019 0439   GFRAA >60 12/30/2018 0940   CBC    Component Value Date/Time   WBC 1.5  (L) 01/21/2019 0439   RBC 2.55 (L) 01/21/2019 0439   HGB 7.4 (L) 01/21/2019 0439   HGB 8.5 (L) 12/30/2018 0940   HCT 22.8 (L) 01/21/2019 0439   PLT 17 (LL) 01/21/2019 0439   PLT 25 (L) 12/30/2018 0940   MCV 89.4 01/21/2019 0439   MCH 29.0 01/21/2019 0439   MCHC 32.5 01/21/2019 0439   RDW 19.9 (H) 01/21/2019 0439   LYMPHSABS 0.6 (L) 01/16/2019 0526   MONOABS 0.2 01/16/2019 0526   EOSABS 0.0 01/16/2019 0526   BASOSABS 0.0 01/16/2019 0526    Assessment/Plan:  1. 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 Received IV dexamethasone, calcitonin, zometa, calcitonin x 4 doses, and IVF's. 2. Calcium levels climbing again. Bone marrow biopsy consistent with persistent myeloma 3. Await Oncology input for alternative treatment options 4. Will increase rate of IVF's (would prefer NS but given poor po intake and hypernatremia, need 1/2NS 5. redose zometa 4 mg IV today (it has been 10 days since initial dose and calcium levels are climbing again) 6. Poor overall prognosis.  Agree with palliative care consult for goals/limits of care. 2. Hypernatremia- back on 1/2NS and increase rate of infusion to 200 ml/min.  Pt is more awake and alert so should be able to improve free water intake 3. AMS- improving 4. AKI- resolved. 5. Hypokalemia- due to poor po intake, replete and follow. Donetta Potts, MD [employer] [phone]

## 2019-01-21 NOTE — Progress Notes (Addendum)
PROGRESS NOTE    Jesus [name]  [name] DOB: 1955/05/17 DOA: 01/11/2019 PCP: Patient, No Pcp Per    Brief Narrative:  Patient is a 63 year old male with history of multiple myeloma, arthritis, cervical stenosis who presented with nosebleed.  He had a similar episode about 2 weeks ago which lasted for 8 hours but spontaneously resolved.  He follows with oncologist at [place] to have severe thrombocytopenia at presentation and also has normocytic anemia.  Hematology/oncology consulted.  Nosebleed has stopped.  Being transfused today for anemia.  Also found to have hypercalcemia secondary to MM and was given a dose of Zometa and Decadron.  Assessment & Plan:   Active Problems:   Hypercalcemia   Epistaxis   Thrombocytopenia (HCC)   Anemia   Leukopenia  Hypercalcemia Secondary to malignancy Calcium levels increasing.  Continue 1/2 normal saline at 150 cc/hr.  s/p Zometa, Decadron, calcitonin Discussed with on call oncologist: recommended dexamethasone 8 mg BID for today followed by 4 mg BID, calcitonin (will avoid intranasal calcitonin given pancytopenia and ongoing epistaxis) and IV fluid boluses. Appreciate assistance.  Nephrology on board to assist with IV fluids   Multiple myeloma, high risk Follows with Dr Irene Limbo and also Dr. Costella Hatcher Rodriguez,oncology, at [facility].  Recent C3 compression fracture status post corpectomy, C2-C4 plating.   Bone scan: Uptake over the right acetabulum, mid to lower thoracic spine, upper anterior ribs and possibly cervicothoracic junction likely representing patient's known multiple myeloma. BM biopsy showed progression of MM with concern for concomitant MDS Currently not on therapy Oncology on board    Hypokalemia  replete as needed  Hypernatremia 0.45 ns  @ 150 cc/hr  Pancytopenia Secondary to malignancy Transfuse as needed for hemoglobin less than 7.5 and platelets less than 10k or if bleeding per onc recs   Epistaxis  Secondary to severe thrombocytopenia Better today  Severe protein calorie malnutrition In setting of advanced disease  Nutrition consult    Neutropenia  Patient is currently afebrile.  Will defer further management to oncology  Constipation Colace   Given severity/ complexity of illness and overall guarded prognosis, palliative care consulted to assist with goals of care discussion.   DVT prophylaxis:SCD Code Status: Full Family Communication:  will call wife  Disposition Plan: Home after full work-up, improvement in the pancytopenia  Consultants:  Oncology, nephrology  Procedures:  bone marrow biopsy by IR   Antimicrobials: Anti-infectives (From admission, onward)   None      Subjective: Patient was seen and examined at bedside.  He is somnolent but awakens on verbal commands.  No nosebleeds this morning. He is not very communicative. No family at bedside. Denies any dyspnea, abdominal pain, bleeding.    Objective: Vitals:   01/21/19 0500 01/21/19 0516 01/21/19 0640 01/21/19 0700  BP:  124/75 127/73 (!) 144/78  Pulse:  97 93 97  Resp:  '18 16 16  ' Temp:  98.6 F (37 C) 98.8 F (37.1 C) 98.8 F (37.1 C)  TempSrc:  Oral Oral Oral  SpO2:  99% 100% 100%  Weight: 51.3 kg  52.9 kg   Height:   5' 7.01" (1.702 m)     Intake/Output Summary (Last 24 hours) at 01/21/2019 0938 Last data filed at 01/21/2019 0700 Gross per 24 hour  Intake 4378.33 ml  Output 5450 ml  Net -1071.67 ml   Filed Weights   01/20/19 0500 01/21/19 0500 01/21/19 0640  Weight: 50 kg 51.3 kg 52.9 kg    Examination:  General exam: Appears comfortable, frail appearing  Respiratory system:  Respiratory effort normal. Cardiovascular system: S1 & S2 heard, RRR. No JVD. No pedal edema. Gastrointestinal system: Abdomen is nondistended, soft and nontender. No organomegaly or masses felt. Normal bowel sounds heard. Central nervous system: Intermittent somnolent. Oriented to place. Short  attention span. Frequent jerking movements of head and forearm. No focal neurological deficits. Extremities: No edema Skin: No obvious rashes  Psychiatry: Judgement and insight appear impaired    Data Reviewed: I have personally reviewed following labs and imaging studies  CBC: Recent Labs  Lab 01/16/19 0526 01/17/19 0559 01/18/19 0407 01/19/19 0249 01/20/19 0420 01/21/19 0439  WBC 1.4* 1.1* 1.0* 2.1* 1.3* 1.5*  NEUTROABS 0.7*  --   --   --   --   --   HGB 8.7* 7.8* 8.3* 8.1* 7.6* 7.4*  HCT 26.4* 24.1* 25.6* 25.1* 23.6* 22.8*  MCV 87.7 87.0 88.9 89.0 88.4 89.4  PLT 10* 61* 48* 36* 25* 17*   Basic Metabolic Panel: Recent Labs  Lab 01/15/19 0442 01/15/19 1744 01/16/19 0526 01/17/19 0559 01/18/19 0407 01/18/19 1605 01/19/19 0249 01/20/19 0420 01/21/19 0439  NA 152*  153*   < > 134* 138 147*  147* 142 136 146* 145  K 3.1*  3.1*  --  2.8* 3.4* 3.6  3.6 3.5 3.7 3.1* 3.3*  CL 119*  119*   < > 104 108 117*  116* 112* 106 113* 111  CO2 26  27   < > '23 26 26  26 25 23 29 29  ' GLUCOSE 163*  165*   < > 127* 101* 104*  103* 125* 163* 99 97  BUN 23  22   < > '11 12 18  18 14 16 16 16  ' CREATININE 1.05  1.06   < > 0.86 0.93 0.93  0.90 0.84 0.74 0.85 0.87  CALCIUM 12.9*  13.0*   < > 11.4* 11.0* 12.5*  12.5* 12.5* 11.1* 11.9* 13.0*  MG 1.7  --  1.1* 1.8 1.8  --  1.4* 1.6* 1.6*  PHOS 2.1*  --  1.5* 4.3 4.1  --  2.3*  --   --    < > = values in this interval not displayed.   GFR: Estimated Creatinine Clearance: 65 mL/min (by C-G formula based on SCr of 0.87 mg/dL). Liver Function Tests: Recent Labs  Lab 01/15/19 0442 01/17/19 0559 01/18/19 0407  AST 36  --   --   ALT 40  --   --   ALKPHOS 123  --   --   BILITOT 0.5  --   --   PROT 7.2  --   --   ALBUMIN 2.9*  2.7* 2.6* 2.7*   No results for input(s): LIPASE, AMYLASE in the last 168 hours. No results for input(s): AMMONIA in the last 168 hours. Coagulation Profile: No results for input(s): INR, PROTIME in  the last 168 hours. Cardiac Enzymes: No results for input(s): CKTOTAL, CKMB, CKMBINDEX, TROPONINI in the last 168 hours. BNP (last 3 results) No results for input(s): PROBNP in the last 8760 hours. HbA1C: No results for input(s): HGBA1C in the last 72 hours. CBG: No results for input(s): GLUCAP in the last 168 hours. Lipid Profile: No results for input(s): CHOL, HDL, LDLCALC, TRIG, CHOLHDL, LDLDIRECT in the last 72 hours. Thyroid Function Tests: No results for input(s): TSH, T4TOTAL, FREET4, T3FREE, THYROIDAB in the last 72 hours. Anemia Panel: No results for input(s): VITAMINB12, FOLATE, FERRITIN, TIBC, IRON, RETICCTPCT  in the last 72 hours. Sepsis Labs: No results for input(s): PROCALCITON, LATICACIDVEN in the last 168 hours.  Recent Results (from the past 240 hour(s))  SARS CORONAVIRUS 2 (TAT 6-24 HRS) Nasopharyngeal Nasopharyngeal Swab     Status: None   Collection Time: 01/11/19  3:32 PM   Specimen: Nasopharyngeal Swab  Result Value Ref Range Status   SARS Coronavirus 2 NEGATIVE NEGATIVE Final    Comment: (NOTE) SARS-CoV-2 target nucleic acids are NOT DETECTED. The SARS-CoV-2 RNA is generally detectable in upper and lower respiratory specimens during the acute phase of infection. Negative results do not preclude SARS-CoV-2 infection, do not rule out co-infections with other pathogens, and should not be used as the sole basis for treatment or other patient management decisions. Negative results must be combined with clinical observations, patient history, and epidemiological information. The expected result is Negative. Fact Sheet for Patients: SugarRoll.be Fact Sheet for Healthcare Providers: https://www.woods-mathews.com/ This test is not yet approved or cleared by the [place] FDA and  has been authorized for detection and/or diagnosis of SARS-CoV-2 by FDA under an Emergency Use Authorization (EUA). This EUA will remain  in  effect (meaning this test can be used) for the duration of the COVID-19 declaration under Section 56 4(b)(1) of the Act, 21 U.S.C. section 360bbb-3(b)(1), unless the authorization is terminated or revoked sooner. Performed at [facility], [place] [address], [place], [place] [address]    Radiology Studies: No results found. Scheduled Meds: . sodium chloride   Intravenous Once  . sodium chloride   Intravenous Once  . sodium chloride   Intravenous Once  . Chlorhexidine Gluconate Cloth  6 each Topical Daily  . docusate sodium  100 mg Oral BID  . dronabinol  10 mg Oral BID  . feeding supplement  1 Container Oral TID BM  . feeding supplement (PRO-STAT SUGAR FREE 64)  30 mL Oral BID  . gabapentin  300 mg Oral TID  . morphine  30 mg Oral Q12H  . oxymetazoline  1 spray Each Nare BID  . potassium chloride  40 mEq Oral Daily  . senna-docusate  1 tablet Oral Daily  . sucralfate  1 g Oral TID AC & HS  . tranexamic acid  500 mg Topical Once   Continuous Infusions: . sodium chloride 150 mL/hr at 01/21/19 0424  . magnesium sulfate bolus IVPB       LOS: 9 days    Time spent: Spent more than 30 minutes in coordinating care for this patient including bedside patient care.    Lucky Cowboy, MD [facility]   If 7PM-7AM, please contact night-coverage

## 2019-01-21 NOTE — Progress Notes (Signed)
CRITICAL VALUE ALERT  Critical Value:  Platelet = 17  Date & Time Notied:  01/21/2019 at 0535  Provider Notified: Floor coverage  Orders Received/Actions taken: waiting for new order.

## 2019-01-21 NOTE — Progress Notes (Addendum)
Physical Therapy Treatment Patient Details Name: Jesus [name] MRN: [id number] DOB: May 27, 1955 Today's Date: 01/21/2019    History of Present Illness Pt is 63 year old male with history of multiple myeloma, arthritis, cervical stenosis who presented with nosebleed.  Pt was found to have severe thrombocytopenia, normocytic anemia, and hypercalcemia secondary to multiple myeloma.  Today platelets are 17 (nurse report pt received platelets since this reading) and Calcium is 13    PT Comments    Pt with significant improvement today.  He was more awake and no episodes of "jerking" or buckling with PT, despite further increase in calcium (wife had reported pt tends to jerk when calcium elevated).   Pt able to ambulate 200' with RW and min guard.  Cont POC.  Unsure of cause of jerking/lethargy at last visit - pt had been OOB for several hours - if occurs again recommend checking BP.   Follow Up Recommendations  Home health PT     Equipment Recommendations  Rolling walker with 5" wheels    Recommendations for Other Services       Precautions / Restrictions Precautions Precautions: Fall Precaution Comments: Check lab values - low platelets and high calcium    Mobility  Bed Mobility Overal bed mobility: Needs Assistance Bed Mobility: Sit to Supine       Sit to supine: Supervision      Transfers Overall transfer level: Needs assistance Equipment used: Rolling walker (2 wheeled) Transfers: Sit to/from Stand Sit to Stand: Min guard         General transfer comment: cues for safe hand placement  Ambulation/Gait Ambulation/Gait assistance: Min guard Gait Distance (Feet): 200 Feet Assistive device: Rolling walker (2 wheeled) Gait Pattern/deviations: Step-through pattern Gait velocity: decreased   General Gait Details: cues for RW; no episodes of buckling   Stairs             Wheelchair Mobility    Modified Rankin (Stroke Patients Only)       Balance                                            Cognition Arousal/Alertness: Awake/alert Behavior During Therapy: WFL for tasks assessed/performed Overall Cognitive Status: Within Functional Limits for tasks assessed                                        Exercises      General Comments        Pertinent Vitals/Pain Pain Assessment: No/denies pain    Home Living                      Prior Function            PT Goals (current goals can now be found in the care plan section) Progress towards PT goals: Progressing toward goals    Frequency    Min 3X/week      PT Plan Current plan remains appropriate    Co-evaluation              AM-PAC PT "6 Clicks" Mobility   Outcome Measure  Help needed turning from your back to your side while in a flat bed without using bedrails?: None Help needed moving from lying on your back to sitting on the side  of a flat bed without using bedrails?: None Help needed moving to and from a bed to a chair (including a wheelchair)?: None Help needed standing up from a chair using your arms (e.g., wheelchair or bedside chair)?: None Help needed to walk in hospital room?: None Help needed climbing 3-5 steps with a railing? : A Little 6 Click Score: 23    End of Session Equipment Utilized During Treatment: Gait belt Activity Tolerance: Patient tolerated treatment well Patient left: with chair alarm set;in chair;with call bell/phone within reach Nurse Communication: Mobility status PT Visit Diagnosis: Unsteadiness on feet (R26.81);Other abnormalities of gait and mobility (R26.89)     Time: 0940-7680 PT Time Calculation (min) (ACUTE ONLY): 18 min  Charges:  $Gait Training: 8-22 mins                     Maggie Font, PT Acute Rehab Services Pager [phone] [facility] [phone] [facility] 01/21/2019, 1:46 PM

## 2019-01-21 NOTE — Progress Notes (Signed)
Occupational Therapy Evaluation Patient Details Name: Jesus [name] MRN: [id number] DOB: [birth year] Today's Date: 01/21/2019    History of Present Illness Pt is 63 year old male with history of multiple myeloma, arthritis, cervical stenosis who presented with nosebleed.  Pt was found to have severe thrombocytopenia, normocytic anemia, and hypercalcemia secondary to multiple myeloma.  Today platelets are 17 (nurse report pt received platelets since this reading) and Calcium is 13   Clinical Impression   PTA, pt was living at home with his wife, pt was independent with ADL/IADL and functional mobiltiy. Pt very lethargic at start of session requiring frequent vc to keep eyes open. Pt more awake once sitting EOB. Pt currently requires modA during grooming at sink level, he requires modA-maxA with functional mobility at RW level due to "jerking" movements that cause knees to buckle, occurred 3x during session requiring physical assistance from therapist to correct. Wife reports the "jerking" is typical when calcium is high. Due to decline in current level of function, pt would benefit from acute OT to address established goals to facilitate safe D/C to venue listed below. At this time, recommend HHOT follow-up. Will continue to follow acutely.     Follow Up Recommendations  Home health OT;Supervision/Assistance - 24 hour    Equipment Recommendations  3 in 1 bedside commode(wife requested hospital bed to assist with transfers)    Recommendations for Other Services       Precautions / Restrictions Precautions Precautions: Fall Precaution Comments: Check lab values - low platelets and high calcium Restrictions Weight Bearing Restrictions: No      Mobility Bed Mobility Overal bed mobility: Needs Assistance Bed Mobility: Sit to Supine;Supine to Sit     Supine to sit: Min guard;HOB elevated Sit to supine: Min guard   General bed mobility comments: minguard for  safety  Transfers Overall transfer level: Needs assistance Equipment used: Rolling walker (2 wheeled) Transfers: Sit to/from Stand Sit to Stand: Mod assist;Max assist         General transfer comment: modA for powerup into standing;intermittent maxA due to "jerking" in BLE causing knees to buckle;initial sit<>stand pt knees buckled causing pt to return to sitting     Balance Overall balance assessment: Needs assistance Sitting-balance support: Bilateral upper extremity supported;Feet supported Sitting balance-Leahy Scale: Fair     Standing balance support: Single extremity supported;During functional activity Standing balance-Leahy Scale: Poor Standing balance comment: limited by jerking/knee  buckles                           ADL either performed or assessed with clinical judgement   ADL Overall ADL's : Needs assistance/impaired Eating/Feeding: Set up;Sitting   Grooming: Moderate assistance;Standing;Oral care;Wash/dry face;Wash/dry hands Grooming Details (indicate cue type and reason): completed at sink level Upper Body Bathing: Minimal assistance;Sitting   Lower Body Bathing: Moderate assistance;Sit to/from stand   Upper Body Dressing : Minimal assistance;Sitting   Lower Body Dressing: Moderate assistance;Sit to/from stand   Toilet Transfer: Moderate assistance;Ambulation;RW Toilet Transfer Details (indicate cue type and reason): simulated Toileting- Clothing Manipulation and Hygiene: Moderate assistance;Sit to/from stand       Functional mobility during ADLs: Moderate assistance;Rolling walker General ADL Comments: pt with frequent "jerking" movements during session primarily in bilateral upper extremities impacting safety and independence with ADL and functional mobiltiy;wife reports "jerking" movements typical when calcium levels are high     Vision Baseline Vision/History: Wears glasses Wears Glasses: At all times Patient Visual Report: No change  from baseline       Perception     Praxis      Pertinent Vitals/Pain Pain Assessment: No/denies pain Faces Pain Scale: No hurt     Hand Dominance Right   Extremity/Trunk Assessment Upper Extremity Assessment Upper Extremity Assessment: Generalized weakness(frequent "jerking" movements throughout session)   Lower Extremity Assessment Lower Extremity Assessment: Defer to PT evaluation   Cervical / Trunk Assessment Cervical / Trunk Assessment: Normal   Communication Communication Communication: No difficulties   Cognition Arousal/Alertness: Awake/alert Behavior During Therapy: WFL for tasks assessed/performed Overall Cognitive Status: Within Functional Limits for tasks assessed Area of Impairment: Following commands                       Following Commands: Follows one step commands with increased time       General Comments: Cognitive status limited by lethargy: pt with minimal responses and following minimal commands: wife reports pt like this when calcium elevated;pt very lethargic at start of session   General Comments       Exercises     Shoulder Instructions      Home Living Family/patient expects to be discharged to:: Private residence Living Arrangements: Spouse/significant other Available Help at Discharge: Available 24 hours/day;Family Type of Home: House Home Access: Stairs to enter [employer] of Steps: 1, no railings but can hold onto a pillar Entrance Stairs-Rails: None Home Layout: Two level;Able to live on main level with bedroom/bathroom Alternate Level Stairs-Number of Steps: flight Alternate Level Stairs-Rails: Right;Left Bathroom Shower/Tub: Occupational psychologist: Standard     Home Equipment: Cane - single point          Prior Functioning/Environment Level of Independence: Independent with assistive device(s)                 OT Problem List: Decreased strength;Decreased activity  tolerance;Decreased range of motion;Impaired balance (sitting and/or standing);Decreased safety awareness;Decreased knowledge of use of DME or AE;Decreased knowledge of precautions      OT Treatment/Interventions: Self-care/ADL training;Therapeutic exercise;Energy conservation;Therapeutic activities;Patient/family education;Balance training    OT Goals(Current goals can be found in the care plan section) Acute Rehab OT Goals Patient Stated Goal: return home OT Goal Formulation: With patient Time For Goal Achievement: 02/04/19 Potential to Achieve Goals: Good ADL Goals Pt Will Perform Grooming: with supervision;standing Pt Will Perform Lower Body Dressing: with supervision;sit to/from stand Pt Will Transfer to Toilet: with supervision;ambulating Additional ADL Goal #1: Pt/wife will demonstrate independence with 3 fall prevention strategies during ADL and functional mobiltiy.  OT Frequency: Min 2X/week   Barriers to D/C: Decreased caregiver support  pt lives with wife       Co-evaluation              AM-PAC OT "6 Clicks" Daily Activity     Outcome Measure Help from another person eating meals?: A Little Help from another person taking care of personal grooming?: A Lot Help from another person toileting, which includes using toliet, bedpan, or urinal?: A Lot Help from another person bathing (including washing, rinsing, drying)?: A Lot Help from another person to put on and taking off regular upper body clothing?: A Little Help from another person to put on and taking off regular lower body clothing?: A Lot 6 Click Score: 14   End of Session Equipment Utilized During Treatment: Gait belt;Rolling walker Nurse Communication: Mobility status  Activity Tolerance: Patient tolerated treatment well Patient left: in bed;with call bell/phone within reach;with  bed alarm set  OT Visit Diagnosis: Unsteadiness on feet (R26.81);Other abnormalities of gait and mobility (R26.89);Muscle  weakness (generalized) (M62.81)                Time: 1594-7076 OT Time Calculation (min): 30 min Charges:  OT General Charges $OT Visit: 1 Visit OT Evaluation $OT Eval Moderate Complexity: 1 Mod OT Treatments $Self Care/Home Management : 8-22 mins  Dorinda Hill OTR/L Acute Rehabilitation Services Office: [place] 01/21/2019, 4:51 PM

## 2019-01-22 LAB — CBC
HCT: 23.2 % — ABNORMAL LOW (ref 39.0–52.0)
Hemoglobin: 7.5 g/dL — ABNORMAL LOW (ref 13.0–17.0)
MCH: 28.4 pg (ref 26.0–34.0)
MCHC: 32.3 g/dL (ref 30.0–36.0)
MCV: 87.9 fL (ref 80.0–100.0)
Platelets: 51 10*3/uL — ABNORMAL LOW (ref 150–400)
RBC: 2.64 MIL/uL — ABNORMAL LOW (ref 4.22–5.81)
RDW: 19.6 % — ABNORMAL HIGH (ref 11.5–15.5)
WBC: 2.4 10*3/uL — ABNORMAL LOW (ref 4.0–10.5)
nRBC: 0 % (ref 0.0–0.2)

## 2019-01-22 LAB — RENAL FUNCTION PANEL
Albumin: 2.5 g/dL — ABNORMAL LOW (ref 3.5–5.0)
Anion gap: 7 (ref 5–15)
BUN: 15 mg/dL (ref 8–23)
CO2: 29 mmol/L (ref 22–32)
Calcium: 12.6 mg/dL — ABNORMAL HIGH (ref 8.9–10.3)
Chloride: 101 mmol/L (ref 98–111)
Creatinine, Ser: 0.73 mg/dL (ref 0.61–1.24)
GFR calc Af Amer: >60 mL/min (ref >60)
GFR calc non Af Amer: >60 mL/min (ref >60)
Glucose, Bld: 140 mg/dL — ABNORMAL HIGH (ref 70–99)
Phosphorus: 2.9 mg/dL (ref 2.5–4.6)
Potassium: 4.1 mmol/L (ref 3.5–5.1)
Sodium: 137 mmol/L (ref 135–145)

## 2019-01-22 LAB — BPAM PLATELET PHERESIS
Blood Product Expiration Date: 202012202359
ISSUE DATE / TIME: 202012190641
Unit Type and Rh: 6200

## 2019-01-22 LAB — PREPARE PLATELET PHERESIS: Unit division: 0

## 2019-01-22 LAB — MAGNESIUM: Magnesium: 2.2 mg/dL (ref 1.7–2.4)

## 2019-01-22 MED ORDER — SODIUM CHLORIDE 0.9 % IV SOLN: INTRAVENOUS | Status: DC

## 2019-01-22 MED ORDER — SORBITOL 70 % SOLN
960.0000 mL | TOPICAL_OIL | Freq: Once | ORAL | Status: AC
  Administered 2019-01-22: 960 mL via RECTAL
  Filled 2019-01-22: qty 473

## 2019-01-22 MED ORDER — BISACODYL 5 MG PO TBEC
10.0000 mg | DELAYED_RELEASE_TABLET | Freq: Every day | ORAL | Status: DC
  Administered 2019-01-22 – 2019-01-26 (×4): 10 mg via ORAL
  Filled 2019-01-22 (×4): qty 2

## 2019-01-22 NOTE — Progress Notes (Signed)
Patient ID: Jesus [name], male   DOB: 22-Nov-1955, 63 y.o.   MRN: [id number] S:More confused this morning and less talkative.  Also with myoclonic jerks O:BP (!) 150/81 (BP Location: Left Arm)   Pulse 77   Temp 98.1 F (36.7 C) (Oral)   Resp 14   Ht 5' 7.01" (1.702 m)   Wt 52.8 kg   SpO2 98%   BMI 18.23 kg/m   Intake/Output Summary (Last 24 hours) at 01/22/2019 0842 Last data filed at 01/22/2019 0533 Gross per 24 hour  Intake 5949.82 ml  Output 9700 ml  Net -3750.18 ml   Intake/Output: I/O last 3 completed shifts: In: 8262.2 [P.O.:1528; I.V.:5609.2; Blood:370; IV Piggyback:755] Out: 40981 [XBJYN:[id number]]  Intake/Output this shift:  No intake/output data recorded. Weight change: 1.5 kg OZH:YQMVHQION AAM lying in bed, confused but in NAD CVS: no rub Resp: cta Abd: +BS, soft, NT/Nd Ext: no edema  Recent Labs  Lab 01/16/19 0526 01/17/19 0559 01/18/19 0407 01/18/19 1605 01/19/19 0249 01/20/19 0420 01/21/19 0439 01/22/19 0412  NA 134* 138 147*  147* 142 136 146* 145 137  K 2.8* 3.4* 3.6  3.6 3.5 3.7 3.1* 3.3* 4.1  CL 104 108 117*  116* 112* 106 113* 111 101  CO2 _0 GLUCOSE 127* 101* 104*  103* 125* 163* 99 97 140*  BUN _1 CREATININE 0.86 0.93 0.93  0.90 0.84 0.74 0.85 0.87 0.73  ALBUMIN  --  2.6* 2.7*  --   --   --   --  2.5*  CALCIUM 11.4* 11.0* 12.5*  12.5* 12.5* 11.1* 11.9* 13.0* 12.6*  PHOS 1.5* 4.3 4.1  --  2.3*  --   --  2.9   Liver Function Tests: Recent Labs  Lab 01/17/19 0559 01/18/19 0407 01/22/19 0412  ALBUMIN 2.6* 2.7* 2.5*   No results for input(s): LIPASE, AMYLASE in the last 168 hours. No results for input(s): AMMONIA in the last 168 hours. CBC: Recent Labs  Lab 01/16/19 0526 01/17/19 0559 01/18/19 0407 01/19/19 0249 01/20/19 0420 01/21/19 0439  WBC 1.4* 1.1* 1.0* 2.1* 1.3* 1.5*  NEUTROABS 0.7*  --   --   --   --   --   HGB 8.7* 7.8* 8.3* 8.1* 7.6* 7.4*  HCT 26.4* 24.1*  25.6* 25.1* 23.6* 22.8*  MCV 87.7 87.0 88.9 89.0 88.4 89.4  PLT 10* 61* 48* 36* 25* 17*   Cardiac Enzymes: No results for input(s): CKTOTAL, CKMB, CKMBINDEX, TROPONINI in the last 168 hours. CBG: No results for input(s): GLUCAP in the last 168 hours.  Iron Studies: No results for input(s): IRON, TIBC, TRANSFERRIN, FERRITIN in the last 72 hours. Studies/Results: No results found. . Chlorhexidine Gluconate Cloth  6 each Topical Daily  . dexamethasone  4 mg Oral Q12H  . docusate sodium  100 mg Oral BID  . dronabinol  10 mg Oral BID  . feeding supplement  1 Container Oral TID BM  . feeding supplement (PRO-STAT SUGAR FREE 64)  30 mL Oral BID  . morphine  30 mg Oral Q12H  . oxymetazoline  1 spray Each Nare BID  . potassium chloride  40 mEq Oral Daily  . senna-docusate  1 tablet Oral Daily  . sucralfate  1 g Oral TID AC & HS    BMET    Component Value Date/Time   NA 137 01/22/2019 0412   K 4.1 01/22/2019 0412  CL 101 01/22/2019 0412   CO2 29 01/22/2019 0412   GLUCOSE 140 (H) 01/22/2019 0412   BUN 15 01/22/2019 0412   CREATININE 0.73 01/22/2019 0412   CREATININE 0.76 12/30/2018 0940   CALCIUM 12.6 (H) 01/22/2019 0412   GFRNONAA >60 01/22/2019 0412   GFRNONAA >60 12/30/2018 0940   GFRAA >60 01/22/2019 0412   GFRAA >60 12/30/2018 0940   CBC    Component Value Date/Time   WBC 1.5 (L) 01/21/2019 0439   RBC 2.55 (L) 01/21/2019 0439   HGB 7.4 (L) 01/21/2019 0439   HGB 8.5 (L) 12/30/2018 0940   HCT 22.8 (L) 01/21/2019 0439   PLT 17 (LL) 01/21/2019 0439   PLT 25 (L) 12/30/2018 0940   MCV 89.4 01/21/2019 0439   MCH 29.0 01/21/2019 0439   MCHC 32.5 01/21/2019 0439   RDW 19.9 (H) 01/21/2019 0439   LYMPHSABS 0.6 (L) 01/16/2019 0526   MONOABS 0.2 01/16/2019 0526   EOSABS 0.0 01/16/2019 0526   BASOSABS 0.0 01/16/2019 0526    Assessment/Plan:  1. 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 Received IV dexamethasone, calcitonin, zometa x 2, calcitonin x 4 doses,  and IVF's. 2. Calcium levels began climbing again so he was dosed a second time with zometa 13m IV on 01/21/19 3. Bone marrow biopsy consistent with persistent myeloma 4. Await Oncology input for alternative treatment options 5. Will change IVFs back to NS 6. Poor overall prognosis.  Agree with palliative care consult for goals/limits of care. 2. Hypernatremia- resolved with 1/2NS will change back to NS to help lower calcium levels 3. Myoclonic jerking- possibly related to gabapentin.  Will stop gabapentin and follow 4. AMS- worse this am.  Stop gabapentin and follow 5. AKI- resolved. 6. Hypokalemia- due to poor po intake, replete and follow. 7. Disposition- poor overall prognosis and agree with palliative care for goals/limits of care.   JDonetta Potts MD [phone]

## 2019-01-22 NOTE — Progress Notes (Signed)
PROGRESS NOTE    Jesus [name]  QJF:[id number] DOB: 01/15/1956 DOA: 01/11/2019 PCP: Patient, No Pcp Per  Brief Narrative:63 year old male with history of multiple myeloma, arthritis, cervical stenosis who presented with nosebleed. He had a similar episode about 2 weeks ago which lasted for 8 hours but spontaneously resolved. He follows with oncologist at [place] to have severe thrombocytopenia at presentation and also has normocytic anemia. Hematology/oncology consulted. Nosebleed has stopped. Being transfused today for anemia. Also found to have hypercalcemia secondary to [facility] was given a dose of Zometa and Decadron.  Assessment & Plan:   Active Problems:   Hypercalcemia   Epistaxis   Thrombocytopenia (HCC)   Anemia   Leukopenia    #1 hypercalcemia secondary to progressive multiple myeloma-s/p zometa x2,decadron and calcitonin x4 and IV fluids Previous attending discussed with oncology who recommended Decadron 8 mg twice daily for 1 day followed by  4 mg twice daily. Intranasal calcitonin was not given due to ongoing epistaxis and pancytopenia.  Calcium 12.6 down from 13 on 01/21/2019 with Decadron and ongoing IV fluids.  #2 hypernatremia resolved sodium 137 today  #3 myoclonic jerking question related to gabapentin nephrology has already stopped gabapentin will monitor.  #4 AKI resolved creatinine 0.73  #5 hypokalemia resolved potassium 4.1 due to decreased nutritional intake  #6 pancytopenia I do not have a CBC from today will check today.  Transfuse to keep hemoglobin above 7.5 and platelets about 10,000 unless patient is bleeding.  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  Bone scan showed uptake over the right acetabulum mid to lower thoracic spine upper anterior ribs and likely cervicothoracic junction.  Recent C3 compression fracture status post corpectomy C2-C4 plating.  Wife would like to know when or how  or if his cancer is going to be treated.  #8 epistaxis seems to have resolved  #9 constipation continue Colace and Dulcolax expected with hypercalcemia  #10 severe protein calorie malnutrition dietary consulted  #11 goals of care patient remains a full code he has progressive multiple myeloma with poor prognosis       Nutrition Problem: Increased nutrient needs Etiology: cancer and cancer related treatments     Signs/Symptoms: estimated needs    Interventions: Boost Breeze, Prostat  Estimated body mass index is 18.23 kg/m as calculated from the following:   Height as of this encounter: 5' 7.01" (1.702 m).   Weight as of this encounter: 52.8 kg.  DVT prophylaxis:SCD Code Status:Full Family Communication:  Discussed with his wife who is concerned that he is not on cancer treatment.   Disposition Plan:Pending clinical improvement Consultants:  Oncology, nephrology  Procedures:  bone marrow biopsy by IR   Antimicrobials: None  Subjective: He was awake he asked me to call his wife and update her.  He had one episode of jerking while I was in the room.  He denied any pain nausea vomiting.  Objective: Vitals:   01/21/19 0950 01/21/19 1504 01/21/19 2000 01/22/19 0532  BP: 136/73 139/79 (!) 147/85 (!) 150/81  Pulse: 99 84 88 77  Resp: '17 20 19 14  ' Temp: 98.2 F (36.8 C) 98.2 F (36.8 C) 98.3 F (36.8 C) 98.1 F (36.7 C)  TempSrc: Oral Oral Oral Oral  SpO2: 100% 100% 100% 98%  Weight:    52.8 kg  Height:        Intake/Output Summary (Last 24 hours) at 01/22/2019 1117 Last data filed at 01/22/2019 0533 Gross per 24  hour  Intake 5579.82 ml  Output 7900 ml  Net -2320.18 ml   Filed Weights   01/21/19 0500 01/21/19 0640 01/22/19 0532  Weight: 51.3 kg 52.9 kg 52.8 kg    Examination:  General exam: Appears calm and comfortable  Respiratory system: Clear to auscultation. Respiratory effort normal. Cardiovascular system: S1 & S2 heard, RRR. No JVD,  murmurs, rubs, gallops or clicks. No pedal edema. Gastrointestinal system: Abdomen is nondistended, soft and nontender. No organomegaly or masses felt. Normal bowel sounds heard. Central nervous system: Alert and oriented. No focal neurological deficits.  Myoclonic jerking noted. Extremities: Symmetric 5 x 5 power. Skin: No rashes, lesions or ulcers Psychiatry: Judgement and insight appear normal. Mood & affect appropriate.     Data Reviewed: I have personally reviewed following labs and imaging studies  CBC: Recent Labs  Lab 01/16/19 0526 01/17/19 0559 01/18/19 0407 01/19/19 0249 01/20/19 0420 01/21/19 0439  WBC 1.4* 1.1* 1.0* 2.1* 1.3* 1.5*  NEUTROABS 0.7*  --   --   --   --   --   HGB 8.7* 7.8* 8.3* 8.1* 7.6* 7.4*  HCT 26.4* 24.1* 25.6* 25.1* 23.6* 22.8*  MCV 87.7 87.0 88.9 89.0 88.4 89.4  PLT 10* 61* 48* 36* 25* 17*   Basic Metabolic Panel: Recent Labs  Lab 01/16/19 0526 01/17/19 0559 01/18/19 0407 01/18/19 1605 01/19/19 0249 01/20/19 0420 01/21/19 0439 01/22/19 0412  NA 134* 138 147*  147* 142 136 146* 145 137  K 2.8* 3.4* 3.6  3.6 3.5 3.7 3.1* 3.3* 4.1  CL 104 108 117*  116* 112* 106 113* 111 101  CO2 '23 26 26  26 25 23 29 29 29  ' GLUCOSE 127* 101* 104*  103* 125* 163* 99 97 140*  BUN '11 12 18  18 14 16 16 16 15  ' CREATININE 0.86 0.93 0.93  0.90 0.84 0.74 0.85 0.87 0.73  CALCIUM 11.4* 11.0* 12.5*  12.5* 12.5* 11.1* 11.9* 13.0* 12.6*  MG 1.1* 1.8 1.8  --  1.4* 1.6* 1.6*  --   PHOS 1.5* 4.3 4.1  --  2.3*  --   --  2.9   GFR: Estimated Creatinine Clearance: 70.6 mL/min (by C-G formula based on SCr of 0.73 mg/dL). Liver Function Tests: Recent Labs  Lab 01/17/19 0559 01/18/19 0407 01/22/19 0412  ALBUMIN 2.6* 2.7* 2.5*   No results for input(s): LIPASE, AMYLASE in the last 168 hours. No results for input(s): AMMONIA in the last 168 hours. Coagulation Profile: No results for input(s): INR, PROTIME in the last 168 hours. Cardiac Enzymes: No results  for input(s): CKTOTAL, CKMB, CKMBINDEX, TROPONINI in the last 168 hours. BNP (last 3 results) No results for input(s): PROBNP in the last 8760 hours. HbA1C: No results for input(s): HGBA1C in the last 72 hours. CBG: No results for input(s): GLUCAP in the last 168 hours. Lipid Profile: No results for input(s): CHOL, HDL, LDLCALC, TRIG, CHOLHDL, LDLDIRECT in the last 72 hours. Thyroid Function Tests: No results for input(s): TSH, T4TOTAL, FREET4, T3FREE, THYROIDAB in the last 72 hours. Anemia Panel: No results for input(s): VITAMINB12, FOLATE, FERRITIN, TIBC, IRON, RETICCTPCT in the last 72 hours. Sepsis Labs: No results for input(s): PROCALCITON, LATICACIDVEN in the last 168 hours.  No results found for this or any previous visit (from the past 240 hour(s)).       Radiology Studies: No results found.      Scheduled Meds: . Chlorhexidine Gluconate Cloth  6 each Topical Daily  . dexamethasone  4  mg Oral Q12H  . docusate sodium  100 mg Oral BID  . dronabinol  10 mg Oral BID  . feeding supplement  1 Container Oral TID BM  . feeding supplement (PRO-STAT SUGAR FREE 64)  30 mL Oral BID  . morphine  30 mg Oral Q12H  . oxymetazoline  1 spray Each Nare BID  . potassium chloride  40 mEq Oral Daily  . senna-docusate  1 tablet Oral Daily  . sucralfate  1 g Oral TID AC & HS   Continuous Infusions: . sodium chloride       LOS: 10 days     Georgette Shell, MD [facility] If 7PM-7AM, please contact night-coverage [email or web address] Password [email or web address] 01/22/2019, 11:17 AM

## 2019-01-23 ENCOUNTER — Telehealth: Payer: Self-pay | Admitting: *Deleted

## 2019-01-23 LAB — RENAL FUNCTION PANEL
Albumin: 2.7 g/dL — ABNORMAL LOW (ref 3.5–5.0)
Anion gap: 6 (ref 5–15)
BUN: 19 mg/dL (ref 8–23)
CO2: 26 mmol/L (ref 22–32)
Calcium: 11 mg/dL — ABNORMAL HIGH (ref 8.9–10.3)
Chloride: 106 mmol/L (ref 98–111)
Creatinine, Ser: 0.75 mg/dL (ref 0.61–1.24)
GFR calc Af Amer: >60 mL/min (ref >60)
GFR calc non Af Amer: >60 mL/min (ref >60)
Glucose, Bld: 132 mg/dL — ABNORMAL HIGH (ref 70–99)
Phosphorus: 2.2 mg/dL — ABNORMAL LOW (ref 2.5–4.6)
Potassium: 3.9 mmol/L (ref 3.5–5.1)
Sodium: 138 mmol/L (ref 135–145)

## 2019-01-23 LAB — CBC
HCT: 23.1 % — ABNORMAL LOW (ref 39.0–52.0)
Hemoglobin: 7.6 g/dL — ABNORMAL LOW (ref 13.0–17.0)
MCH: 28.9 pg (ref 26.0–34.0)
MCHC: 32.9 g/dL (ref 30.0–36.0)
MCV: 87.8 fL (ref 80.0–100.0)
Platelets: 45 10*3/uL — ABNORMAL LOW (ref 150–400)
RBC: 2.63 MIL/uL — ABNORMAL LOW (ref 4.22–5.81)
RDW: 19.7 % — ABNORMAL HIGH (ref 11.5–15.5)
WBC: 2.6 10*3/uL — ABNORMAL LOW (ref 4.0–10.5)
nRBC: 0 % (ref 0.0–0.2)

## 2019-01-23 LAB — MAGNESIUM: Magnesium: 1.9 mg/dL (ref 1.7–2.4)

## 2019-01-23 MED ORDER — LORAZEPAM 2 MG/ML IJ SOLN
0.5000 mg | Freq: Once | INTRAMUSCULAR | Status: AC
  Administered 2019-01-23: 0.5 mg via INTRAVENOUS
  Filled 2019-01-23: qty 1

## 2019-01-23 MED ORDER — DEXAMETHASONE 4 MG PO TABS: 4.0000 mg | ORAL_TABLET | Freq: Two times a day (BID) | ORAL | Status: DC

## 2019-01-23 MED ORDER — LORAZEPAM 2 MG/ML IJ SOLN
0.5000 mg | Freq: Four times a day (QID) | INTRAMUSCULAR | Status: DC | PRN
  Administered 2019-01-24 – 2019-02-08 (×12): 0.5 mg via INTRAVENOUS
  Filled 2019-01-23 (×13): qty 1

## 2019-01-23 NOTE — Telephone Encounter (Signed)
Wife called requesting to talk to Dr. Irene Limbo about pt's treatment.  Pt is currently in hospital.   Spoke with wife and was informed that pt was notified last week by [facility] provider about eligibility for a clinical trial.  Pt asked the prvider to discuss issue with Dr. Irene Limbo.  Wife would like  to know the outcome of the phone call between the providers. Wife's  Phone     [phone].

## 2019-01-23 NOTE — Progress Notes (Signed)
PROGRESS NOTE    Jesus [name]  SPQ:[id number] DOB: February 09, 1955 DOA: 01/11/2019 PCP: Patient, No Pcp Per    Brief Narrative:  63 year old male with history of multiple myeloma, arthritis, cervical stenosis who presented with nosebleed. He had a similar episode about 2 weeks ago which lasted for 8 hours but spontaneously resolved. He follows with oncologist at [place] to have severe thrombocytopenia at presentation and also has normocytic anemia. Hematology/oncology consulted. Nosebleed has stopped. Being transfused today for anemia. Also found to have hypercalcemia secondary to [facility] was given a dose of Zometa and Decadron Assessment & Plan:   Active Problems:   Hypercalcemia   Epistaxis   Thrombocytopenia (HCC)   Anemia   Leukopenia   #1 hypercalcemia secondary to progressive multiple myeloma-s/p zometa x2,decadron and calcitonin x4 and IV fluids Previous attending discussed with oncology who recommended Decadron 8 mg twice daily for 1 day followed by  4 mg twice daily. Intranasal calcitonin was not given due to ongoing epistaxis and pancytopenia.  Calcium 11.0 down from 12.6 down from 13 on 01/21/2019 with Decadron and ongoing IV fluids.  #2 hypernatremia resolved sodium 138 today.  Continue normal saline.  #3 myoclonic jerking almost resolved secondary to gabapentin which has been stopped.    #4 AKI resolved creatinine 0.75  #5 hypokalemia resolved potassium 3.9 due to decreased nutritional intake.  #6 pancytopenia hemoglobin 7.6 platelets 45.   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  Bone scan showed uptake over the right acetabulum mid to lower thoracic spine upper anterior ribs and likely cervicothoracic junction.  Recent C3 compression fracture status post corpectomy C2-C4 plating.  Wife would like to know when or how or if his cancer is going to be treated.  #8 epistaxis seems to have  resolved  #9 constipation continue Colace and Dulcolax expected with hypercalcemia.  Patient had bowel movement yesterday after smog enema.  #10 severe protein calorie malnutrition dietary consulted  #11 goals of care patient remains a full code he has progressive multiple myeloma with poor prognosis        Nutrition Problem: Increased nutrient needs Etiology: cancer and cancer related treatments     Signs/Symptoms: estimated needs    Interventions: Boost Breeze, Prostat  Estimated body mass index is 18.85 kg/m as calculated from the following:   Height as of this encounter: 5' 7.01" (1.702 m).   Weight as of this encounter: 54.6 kg.  DVT prophylaxis:SCD Code Status:Full Family Communication: Discussed with his wife who is concerned that he is not on cancer treatment.   Disposition Plan:Pending clinical improvement Consultants: Oncology, nephrology  Procedures:bone marrow biopsy by IR   Antimicrobials: None   Subjective:  Patient sitting up in chair decreased jerking noted compared to yesterday awake alert reports having a bowel movement yesterday.  Objective: Vitals:   01/22/19 1428 01/22/19 2056 01/23/19 0500 01/23/19 0541  BP: (!) 162/78 131/72  (!) 159/97  Pulse: 100 86  100  Resp: _0 Temp: 98.2 F (36.8 C) 97.7 F (36.5 C)  98.1 F (36.7 C)  TempSrc: Oral Oral  Oral  SpO2: 100% 100%  100%  Weight:   54.6 kg   Height:        Intake/Output Summary (Last 24 hours) at 01/23/2019 1124 Last data filed at 01/23/2019 0941 Gross per 24 hour  Intake 355 ml  Output 5200 ml  Net -4845 ml   Filed Weights   01/21/19 0640  01/22/19 0532 01/23/19 0500  Weight: 52.9 kg 52.8 kg 54.6 kg    Examination:  General exam: Appears calm and comfortable  Respiratory system: Clear to auscultation. Respiratory effort normal. Cardiovascular system: S1 & S2 heard, RRR. No JVD, murmurs, rubs, gallops or clicks. No pedal edema. Gastrointestinal  system: Abdomen is nondistended, soft and nontender. No organomegaly or masses felt. Normal bowel sounds heard. Central nervous system: Alert and oriented. No focal neurological deficits. Extremities: Symmetric 5 x 5 power. Skin: No rashes, lesions or ulcers Psychiatry: Judgement and insight appear normal. Mood & affect appropriate.     Data Reviewed: I have personally reviewed following labs and imaging studies  CBC: Recent Labs  Lab 01/19/19 0249 01/20/19 0420 01/21/19 0439 01/22/19 1541 01/23/19 0348  WBC 2.1* 1.3* 1.5* 2.4* 2.6*  HGB 8.1* 7.6* 7.4* 7.5* 7.6*  HCT 25.1* 23.6* 22.8* 23.2* 23.1*  MCV 89.0 88.4 89.4 87.9 87.8  PLT 36* 25* 17* 51* 45*   Basic Metabolic Panel: Recent Labs  Lab 01/17/19 0559 01/18/19 0407 01/19/19 0249 01/20/19 0420 01/21/19 0439 01/22/19 0412 01/23/19 0348  NA 138 147*  147* 136 146* 145 137 138  K 3.4* 3.6  3.6 3.7 3.1* 3.3* 4.1 3.9  CL 108 117*  116* 106 113* 111 101 106  CO2 _0 GLUCOSE 101* 104*  103* 163* 99 97 140* 132*  BUN _1 CREATININE 0.93 0.93  0.90 0.74 0.85 0.87 0.73 0.75  CALCIUM 11.0* 12.5*  12.5* 11.1* 11.9* 13.0* 12.6* 11.0*  MG 1.8 1.8 1.4* 1.6* 1.6* 2.2 1.9  PHOS 4.3 4.1 2.3*  --   --  2.9 2.2*   GFR: Estimated Creatinine Clearance: 73 mL/min (by C-G formula based on SCr of 0.75 mg/dL). Liver Function Tests: Recent Labs  Lab 01/17/19 0559 01/18/19 0407 01/22/19 0412 01/23/19 0348  ALBUMIN 2.6* 2.7* 2.5* 2.7*   No results for input(s): LIPASE, AMYLASE in the last 168 hours. No results for input(s): AMMONIA in the last 168 hours. Coagulation Profile: No results for input(s): INR, PROTIME in the last 168 hours. Cardiac Enzymes: No results for input(s): CKTOTAL, CKMB, CKMBINDEX, TROPONINI in the last 168 hours. BNP (last 3 results) No results for input(s): PROBNP in the last 8760 hours. HbA1C: No results for input(s): HGBA1C in the last 72  hours. CBG: No results for input(s): GLUCAP in the last 168 hours. Lipid Profile: No results for input(s): CHOL, HDL, LDLCALC, TRIG, CHOLHDL, LDLDIRECT in the last 72 hours. Thyroid Function Tests: No results for input(s): TSH, T4TOTAL, FREET4, T3FREE, THYROIDAB in the last 72 hours. Anemia Panel: No results for input(s): VITAMINB12, FOLATE, FERRITIN, TIBC, IRON, RETICCTPCT in the last 72 hours. Sepsis Labs: No results for input(s): PROCALCITON, LATICACIDVEN in the last 168 hours.  No results found for this or any previous visit (from the past 240 hour(s)).       Radiology Studies: No results found.      Scheduled Meds: . bisacodyl  10 mg Oral Daily  . Chlorhexidine Gluconate Cloth  6 each Topical Daily  . dexamethasone  4 mg Oral Q12H  . docusate sodium  100 mg Oral BID  . dronabinol  10 mg Oral BID  . feeding supplement  1 Container Oral TID BM  . feeding supplement (PRO-STAT SUGAR FREE 64)  30 mL Oral BID  . morphine  30 mg Oral Q12H  . oxymetazoline  1 spray  Each Nare BID  . senna-docusate  1 tablet Oral Daily  . sucralfate  1 g Oral TID AC & HS   Continuous Infusions: . sodium chloride 150 mL/hr at 01/23/19 0844     LOS: 11 days     Georgette Shell, MD [facility]   If 7PM-7AM, please contact night-coverage [email or web address] Password [facility] 01/23/2019, 11:24 AM

## 2019-01-23 NOTE — Care Management Important Message (Signed)
Important Message  Patient Details IM Letter given to Roque Lias SW Case Manager to present to the Patient Name: Jesus [name] MRN: [id number] Date of Birth: 21-Dec-1955   Medicare Important Message Given:  Yes     Kerin Salen 01/23/2019, 11:52 AM

## 2019-01-23 NOTE — Consult Note (Signed)
Palliative Care Consult Note  63 yo with Multiple Myeloma, admitted with severe hypercalcemia and thrombocytopenia. Palliative care consulted for goals of care.   Jesus [name] answers simple questions for me today, but he is not in any distress and denies pain. His calcium level remains high despite treatment with IV dexamethasone, bisphosphonate and calcitonin. Radiation has been very helpful for painful bone mets and he is not requiring additional pain medication.   His goals remain to accept and treatment offered and continue to regain his strength and functional status. His wife understands that there may be no additional tx option soon but wants to "keep going" for as long as possible.  At this time he would mostly benefit from [place] - would like for his calcium to improve and for him to be a part of that discussion.  Recommend a Community Based Palliative Care consultation upon discharge from the hospital. Please call us if his condition declines or there any additional symptom management needs.  Time: 30 min Greater than 50%  of this time was spent counseling and coordinating care related to the above assessment and plan.  Lane Hacker, DO Palliative Medicine

## 2019-01-23 NOTE — Progress Notes (Signed)
Patient noted to be very confused, increased confusion from this morning.  Wife concerned.  MD notified.  See new orders.  Will continue to monitor for any needed interventions.  Virginia Rochester, RN

## 2019-01-23 NOTE — Progress Notes (Signed)
[name]   HEMATOLOGY/ONCOLOGY INPATIENT PROGRESS NOTE  Inpatient Attending: .Georgette Shell, MD   SUBJECTIVE  When seen today, the patient is sitting up in the recliner.  His wife is at the bedside.  He is awake and answers all my questions.  The patient's wife reports that he ate a small amount of lunch and that he is full.  She reports that he has been working with PT/OT.  He is not complaining of any pain.  He denies fevers and chills.  Denies bleeding.  He last received a platelet transfusion on 01/21/2019.  Last PRBC transfusion was on 01/12/2019.  Wife is inquiring as to when his next chemotherapy will be.  OBJECTIVE:  PHYSICAL EXAMINATION: .  01/19/19 1447 01/19/19 2102  BP: 133/79 129/82  Pulse: 94 92  Resp: 20 18  Temp: 98.1 F (36.7 C) 98.2 F (36.8 C)  TempSrc: Oral Oral  SpO2: 98% 98%  Weight:    Height:     Filed Weights   01/21/19 0640 01/22/19 0532 01/23/19 0500  Weight: 116 lb 10 oz (52.9 kg) 116 lb 6.5 oz (52.8 kg) 120 lb 5.9 oz (54.6 kg)   .Body mass index is 18.85 kg/m.  GENERAL: Awake and alert, comfortable SKIN: No rashes or petechiae Oropharynx: No thrush or mucositis LYMPH:  no palpable lymphadenopathy in the cervical, axillary or inguinal LUNGS: clear to auscultation with normal respiratory effort HEART: regular rate & rhythm,  no murmurs and no lower extremity edema ABDOMEN: abdomen soft, non-tender, normoactive bowel sounds  Musculoskeletal: no cyanosis of digits and no clubbing  PSYCH: alert & somewhat confused and a little sleepy. NEURO: no focal motor/sensory deficits.     MEDICAL HISTORY:  Past Medical History:  Diagnosis Date  . Arthritis   . Cancer ([place])    multiple myeloma  . Cervical stenosis of spinal canal   . Wears glasses     SURGICAL HISTORY: Past Surgical History:  Procedure Laterality Date  . ANTERIOR CERVICAL CORPECTOMY N/A 11/04/2018   Procedure: Cervical three Corpectomy with Cervical two to Cervical four Plating;   Surgeon: Erline Levine, MD;  Location: [facility];  Service: Neurosurgery;  Laterality: N/A;  . APPENDECTOMY    . BACK SURGERY    . EYE SURGERY Bilateral    cataract surgery with lens implants  . HERNIA REPAIR Left    inguinal   . IR IMAGING GUIDED PORT INSERTION  06/21/2018  . ROTATOR CUFF REPAIR Left     SOCIAL HISTORY: Social History   Socioeconomic History  . Marital status: Married    Spouse name: Not on file  . Number of children: Not on file  . Years of education: Not on file  . Highest education level: Not on file  Occupational History  . Not on file  Tobacco Use  . Smoking status: Never Smoker  . Smokeless tobacco: Never Used  Substance and Sexual Activity  . Alcohol use: Never  . Drug use: Never  . Sexual activity: Not on file  Other Topics Concern  . Not on file  Social History Narrative  . Not on file   Social Determinants of Health   Financial Resource Strain:   . Difficulty of Paying Living Expenses: Not on file  Food Insecurity:   . Worried About Charity fundraiser in the Last Year: Not on file  . Ran Out of Food in the Last Year: Not on file  Transportation Needs: No Transportation Needs  . Lack of Transportation (Medical):  No  . Lack of Transportation (Non-Medical): No  Physical Activity:   . Days of Exercise per Week: Not on file  . Minutes of Exercise per Session: Not on file  Stress:   . Feeling of Stress : Not on file  Social Connections:   . Frequency of Communication with Friends and Family: Not on file  . Frequency of Social Gatherings with Friends and Family: Not on file  . Attends Religious Services: Not on file  . Active Member of Clubs or Organizations: Not on file  . Attends Archivist Meetings: Not on file  . Marital Status: Not on file  Intimate Partner Violence:   . Fear of Current or Ex-Partner: Not on file  . Emotionally Abused: Not on file  . Physically Abused: Not on file  . Sexually Abused: Not on file    FAMILY  HISTORY: Family History  Problem Relation Age of Onset  . Breast cancer Sister     ALLERGIES:  is allergic to penicillins.  MEDICATIONS:  Scheduled Meds: . bisacodyl  10 mg Oral Daily  . Chlorhexidine Gluconate Cloth  6 each Topical Daily  . dexamethasone  4 mg Oral Q12H  . docusate sodium  100 mg Oral BID  . dronabinol  10 mg Oral BID  . feeding supplement  1 Container Oral TID BM  . feeding supplement (PRO-STAT SUGAR FREE 64)  30 mL Oral BID  . morphine  30 mg Oral Q12H  . oxymetazoline  1 spray Each Nare BID  . senna-docusate  1 tablet Oral Daily  . sucralfate  1 g Oral TID AC & HS   Continuous Infusions: . sodium chloride 150 mL/hr at 01/23/19 0844   PRN Meds:.  REVIEW OF SYSTEMS:    10 Point review of Systems was done is negative except as noted above.   LABORATORY DATA:  I have reviewed the data as listed  . CBC Latest Ref Rng & Units 01/19/2019 01/18/2019  WBC 4.0 - 10.5 K/uL 2.1(L) 1.0(LL)  Hemoglobin 13.0 - 17.0 g/dL 8.1(L) 8.3(L)  Hematocrit 39.0 - 52.0 % 25.1(L) 25.6(L)  Platelets 150 - 400 K/uL 36(L) 48(L)    . CMP Latest Ref Rng & Units 01/19/2019 01/18/2019  Glucose 70 - 99 mg/dL 163(H) 125(H)  BUN 8 - 23 mg/dL 16 14  Creatinine 0.61 - 1.24 mg/dL 0.74 0.84  Sodium 135 - 145 mmol/L 136 142  Potassium 3.5 - 5.1 mmol/L 3.7 3.5  Chloride 98 - 111 mmol/L 106 112(H)  CO2 22 - 32 mmol/L 23 25  Calcium 8.9 - 10.3 mg/dL 11.1(H) 12.5(H)  Total Protein 6.5 - 8.1 g/dL - -  Total Bilirubin 0.3 - 1.2 mg/dL - -  Alkaline Phos 38 - 126 U/L - -  AST 15 - 41 U/L - -  ALT 0 - 44 U/L - -   Surgical Pathology  CASE: [id number]  PATIENT: Amando Winograd  Bone Marrow Report   Clinical History: Multiple myeloma, concern for disease progression    DIAGNOSIS:   BONE MARROW, ASPIRATE, CLOT, CORE:  - Persistent involvement by plasma cell myeloma.  - Mild myeloid dysplasia.  - See comment.   PERIPHERAL BLOOD:  - Pancytopenia.   COMMENT:   There is a  mild increased in monotypic plasma cells (5-10%) consistent  with persistent involvement by the patient's plasma cell myeloma. There  is mild myeloid dysplasia raising the question of an underlying  myelodysplastic syndrome and FISH for MDS has been ordered.  MICROSCOPIC DESCRIPTION:   PERIPHERAL BLOOD SMEAR: There is a normocytic anemia with hypochromic  cells and mild rouleaux formation. Leukocytes are reduced in numbers  with pseudo-Pelger-Hut type neutrophils. Circulating plasma cells are  not identified. Platelets are reduced in numbers.   BONE MARROW ASPIRATE: Spicular and cellular.  Erythroid precursors: Mild relative increase in numbers. No significant  dysplasia.  Granulocytic precursors: Mild relative decrease in. Mild left shifted  maturation. No increase in blasts.  Megakaryocytes: Decreased in numbers but with unremarkable morphology.  Lymphocytes/plasma cells: Plasma cells are not significantly increased,  but atypical forms (large forms, bizarre forms, multi-nucleated forms  and forms with prominent nucleoli) are present. There is no increase in  lymphocytes.   TOUCH PREPARATIONS: Similar to aspirate smears.   CLOT AND BIOPSY: The core biopsy and clot section are variably cellular  (30 to 60%). There are focal clusters of atypical plasma cells  highlighted by CD138 (estimated 5-10% of cells) with associated  fibrosis. By in situ hybridization plasma cells are lambda restricted.  Background hematopoiesis is present with a mild erythroid  hyperplasia/myeloid hypoplasia. Megakaryocytes are slightly reduced.  There are no atypical lymphoid aggregates.   IRON STAIN: Iron stains are performed on a bone marrow aspirate or touch  imprint smear and section of clot. The controls stained appropriately.     Storage Iron: Present    Ring Sideroblasts: Absent   ADDITIONAL DATA/TESTING: Cytogenetics, including FISH for myeloma and  MDS, was ordered and will  be reported separately. c  RADIOGRAPHIC STUDIES: I have personally reviewed the radiological images as listed and agreed with the findings in the report. NM Bone Scan Whole Body  Result Date: 01/12/2019 CLINICAL DATA:  History of multiple myeloma with acute back pain. EXAM: NUCLEAR MEDICINE WHOLE BODY BONE SCAN TECHNIQUE: Whole body anterior and posterior images were obtained approximately 3 hours after intravenous injection of radiopharmaceutical. RADIOPHARMACEUTICALS:  21.8 mCi Technetium-102mMDP IV COMPARISON:  PET-CT 10/13/2018 FINDINGS: Examination demonstrates patchy increased uptake over the mid to lower thoracic spine over known areas of fusion hardware and bone destruction representing patient's known multiple myeloma. Mild uptake over the right acetabulum likely representing patient's multiple myeloma. Minimal hazy uptake on the anterior image at the cervicothoracic junction which may represent patient's known myelomatous involvement of the cervical spine. Minimal patchy uptake over several anterior upper ribs bilaterally which may be due to patient's myomatous disease. Minimal degenerative changes over the shoulders and knees. IMPRESSION: 1. Uptake over the right acetabulum, mid to lower thoracic spine, upper anterior ribs and possibly cervicothoracic junction likely representing patient's known multiple myeloma. 2.  Degenerative changes as described. Electronically Signed   By: DMarin OlpM.D.   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 He presents with pancytopenia and severe hypercalcemia. He requires bone marrow biopsy to evaluate for progression of disease. EXAM: CT GUIDED BONE MARROW ASPIRATION AND CORE BIOPSY Interventional Radiologist:  HCriselda Peaches MD MEDICATIONS: None. ANESTHESIA/SEDATION: Moderate (conscious) sedation was employed during this  procedure. A total of 2 milligrams versed and 100 micrograms fentanyl were administered intravenously. The patient's level of consciousness and vital signs were monitored continuously by radiology nursing throughout the procedure under my direct supervision. Total monitored sedation time: 10 minutes FLUOROSCOPY TIME:  None COMPLICATIONS: None immediate. Estimated blood loss: <25 mL PROCEDURE: Informed written consent was obtained from the patient after a thorough discussion of  the procedural risks, benefits and alternatives. All questions were addressed. Maximal Sterile Barrier Technique was utilized including caps, mask, sterile gowns, sterile gloves, sterile drape, hand hygiene and skin antiseptic. A timeout was performed prior to the initiation of the procedure. The patient was positioned prone and non-contrast localization CT was performed of the pelvis to demonstrate the iliac marrow spaces. Maximal barrier sterile technique utilized including caps, mask, sterile gowns, sterile gloves, large sterile drape, hand hygiene, and betadine prep. Under sterile conditions and local anesthesia, an 11 gauge coaxial bone biopsy needle was advanced into the right iliac marrow space. Needle position was confirmed with CT imaging. Initially, bone marrow aspiration was performed. Next, the 11 gauge outer cannula was utilized to obtain a right iliac bone marrow core biopsy. Needle was removed. Hemostasis was obtained with compression. The patient tolerated the procedure well. Samples were prepared with the cytotechnologist. IMPRESSION: Technically successful CT-guided bone marrow biopsy and aspiration. Signed, Criselda Peaches, MD, [facility] Radiology Electronically Signed   By: Jacqulynn Cadet M.D.   On: 01/16/2019 10:58   DG Swallowing Func-Speech Pathology  Result Date: 01/16/2019 Objective Swallowing Evaluation: Type of Study: Bedside Swallow Evaluation   Patient Details Name: QUANTAVIUS HUMM MRN: [id number] Date of Birth: 1955-02-08 Today's Date: 01/16/2019 Time: SLP Start Time (ACUTE ONLY): 1305 -SLP Stop Time (ACUTE ONLY): 1335 SLP Time Calculation (min) (ACUTE ONLY): 30 min Past Medical History: Past Medical History: Diagnosis Date . Arthritis  . Cancer ([place])   multiple myeloma . Cervical stenosis of spinal canal  . Wears glasses  Past Surgical History: Past Surgical History: Procedure Laterality Date . ANTERIOR CERVICAL CORPECTOMY N/A 11/04/2018  Procedure: Cervical three Corpectomy with Cervical two to Cervical four Plating;  Surgeon: Erline Levine, MD;  Location: [facility];  Service: Neurosurgery;  Laterality: N/A; . APPENDECTOMY   . BACK SURGERY   . EYE SURGERY Bilateral   cataract surgery with lens implants . HERNIA REPAIR Left   inguinal  . IR IMAGING GUIDED PORT INSERTION  06/21/2018 . ROTATOR CUFF REPAIR Left  HPI: Patient is a 63 y.o. male with PMH: multiple myeloma, arthritis, cervical stenosis, who presented to hospital with nosebleed which was not able to be controlled at home despite attempts with pressure, paper towels and ice. He had similar episodes two weeks prior where bleeding lasted 8 hours before it finally stopped. Patient has had poor appetite for past couple weeks. Wife reported he seemed more confused in AM of admission. on 12/11, RN reported patient with significant difficulty with swallowing and unable to take medications and so SLP BSE ordered.  Pt underwent BSE on 01/14/2019 and was found to be too high risk for aspiration with recommendation for npo.  Subjective: pt awake in chair in flouro suite Assessment / Plan / Recommendation CHL IP CLINICAL IMPRESSIONS 01/16/2019 Clinical Impression Pt presents with minimal oropharyngeal dysphagia without aspiration or laryngeal penetration of any consistency tested.  In general, pt's swallow is strong and timely with adequate airway protection even with sequential liquid bolus swallows.  He did  demonstrate difficulty orally transiting tablet with thin barium - and after 3 unsuccesful attempts, pudding effective to transit.  In addition, decreased UES relaxation noted when swallowing masticated cracker which results in residuals without pt awareness.  Liquid swallow faciliated clearance but also resulted in trace backflow, thus recommend strict esophageal precautions.  Given pt's generalized weakness and decreased UES clearance with solids (not sensate) - dys3 diet advised at this  time.  Medicine with puree- whole - starting and following with liquids.  Of note, pt did NOT cough during entire MBS - symptoms of cough with liquids reported prior to admission and during hospitalization.  SLP educated pt and his wife (present for test) to findings/recommendations.  Will follow up for dysphagia management/treatment. Anticipate pt will benefit from dysphagia treatment given his XRT to pharyngeal region. SLP Visit Diagnosis Dysphagia, oropharyngeal phase (R13.12) Attention and concentration deficit following -- Frontal lobe and executive function deficit following -- Impact on safety and function Mild aspiration risk   CHL IP TREATMENT RECOMMENDATION 01/16/2019 Treatment Recommendations Therapy as outlined in treatment plan below   Prognosis 01/16/2019 Prognosis for Safe Diet Advancement Good Barriers to Reach Goals -- Barriers/Prognosis Comment -- CHL IP DIET RECOMMENDATION 01/16/2019 SLP Diet Recommendations Dysphagia 3 (Mech soft) solids;Thin liquid Liquid Administration via Cup;Straw Medication Administration Whole meds with puree Compensations Small sips/bites;Slow rate;Effortful swallow;Minimize environmental distractions Postural Changes --   CHL IP OTHER RECOMMENDATIONS 01/16/2019 Recommended Consults -- Oral Care Recommendations Oral care BID Other Recommendations Clarify dietary restrictions   CHL IP FOLLOW UP RECOMMENDATIONS 01/16/2019 Follow up Recommendations Skilled Nursing facility   [facility] IP  FREQUENCY AND DURATION 01/16/2019 Speech Therapy Frequency (ACUTE ONLY) min 2x/week Treatment Duration 2 weeks      CHL IP ORAL PHASE 01/16/2019 Oral Phase Impaired Oral - Pudding Teaspoon -- Oral - Pudding Cup -- Oral - Honey Teaspoon -- Oral - Honey Cup -- Oral - Nectar Teaspoon -- Oral - Nectar Cup WFL Oral - Nectar Straw -- Oral - Thin Teaspoon -- Oral - Thin Cup WFL Oral - Thin Straw WFL Oral - Puree WFL Oral - Mech Soft WFL Oral - Regular -- Oral - Multi-Consistency -- Oral - Pill Reduced posterior propulsion;Weak lingual manipulation Oral Phase - Comment pt needed pudding to orally transit tablet after unable x3 with liquids  CHL IP PHARYNGEAL PHASE 01/16/2019 Pharyngeal Phase WFL;Impaired Pharyngeal- Pudding Teaspoon -- Pharyngeal -- Pharyngeal- Pudding Cup -- Pharyngeal -- Pharyngeal- Honey Teaspoon -- Pharyngeal -- Pharyngeal- Honey Cup -- Pharyngeal -- Pharyngeal- Nectar Teaspoon -- Pharyngeal -- Pharyngeal- Nectar Cup [facility] Pharyngeal Material does not enter airway Pharyngeal- Nectar Straw -- Pharyngeal -- Pharyngeal- Thin Teaspoon -- Pharyngeal -- Pharyngeal- Thin Cup [facility] Pharyngeal Material does not enter airway Pharyngeal- Thin Straw WFL Pharyngeal Material does not enter airway Pharyngeal- Puree WFL Pharyngeal Material does not enter airway Pharyngeal- Mechanical Soft Pharyngeal residue - cp segment Pharyngeal Material does not enter airway Pharyngeal- Regular NT Pharyngeal -- Pharyngeal- Multi-consistency -- Pharyngeal -- Pharyngeal- Pill WFL Pharyngeal Material does not enter airway Pharyngeal Comment --  CHL IP CERVICAL ESOPHAGEAL PHASE 01/16/2019 Cervical Esophageal Phase Impaired Pudding Teaspoon -- Pudding Cup -- Honey Teaspoon -- Honey Cup -- Nectar Teaspoon -- Nectar Cup WFL Nectar Straw -- Thin Teaspoon -- Thin Cup WFL Thin Straw Reduced cricopharyngeal relaxation;Esophageal backflow into cervical esophagus Puree WFL Mechanical Soft Reduced cricopharyngeal relaxation;Esophageal backflow into  cervical esophagus Regular -- Multi-consistency -- Pill WFL Cervical Esophageal Comment pt without sensation to minimal residuals at pyriform/above UES region, liquid assisted clearance but also resulted in trace backflow, barium tablet swallowed with pudding appeared to transit into sttomach without delay Kathleen Lime, MS [facility] SLP Acute Rehab Services Office [phone] [name] 01/16/2019, 2:19 PM              CT BONE MARROW BIOPSY & ASPIRATION  Result Date: 01/16/2019 INDICATION: 63 year old male with a history of multiple myeloma status post stem cell  transplant and chemotherapy. He presents with pancytopenia and severe hypercalcemia. He requires bone marrow biopsy to evaluate for progression of disease. EXAM: CT GUIDED BONE MARROW ASPIRATION AND CORE BIOPSY Interventional Radiologist:  Criselda Peaches, MD MEDICATIONS: None. ANESTHESIA/SEDATION: Moderate (conscious) sedation was employed during this procedure. A total of 2 milligrams versed and 100 micrograms fentanyl were administered intravenously. The patient's level of consciousness and vital signs were monitored continuously by radiology nursing throughout the procedure under my direct supervision. Total monitored sedation time: 10 minutes FLUOROSCOPY TIME:  None COMPLICATIONS: None immediate. Estimated blood loss: <25 mL PROCEDURE: Informed written consent was obtained from the patient after a thorough discussion of the procedural risks, benefits and alternatives. All questions were addressed. Maximal Sterile Barrier Technique was utilized including caps, mask, sterile gowns, sterile gloves, sterile drape, hand hygiene and skin antiseptic. A timeout was performed prior to the initiation of the procedure. The patient was positioned prone and non-contrast localization CT was performed of the pelvis to demonstrate the iliac marrow spaces. Maximal barrier sterile technique utilized including caps, mask, sterile gowns, sterile gloves, large  sterile drape, hand hygiene, and betadine prep. Under sterile conditions and local anesthesia, an 11 gauge coaxial bone biopsy needle was advanced into the right iliac marrow space. Needle position was confirmed with CT imaging. Initially, bone marrow aspiration was performed. Next, the 11 gauge outer cannula was utilized to obtain a right iliac bone marrow core biopsy. Needle was removed. Hemostasis was obtained with compression. The patient tolerated the procedure well. Samples were prepared with the cytotechnologist. IMPRESSION: Technically successful CT-guided bone marrow biopsy and aspiration. Signed, Criselda Peaches, MD, [place] [facility] Radiology Electronically Signed   By: Jacqulynn Cadet M.D.   On: 01/16/2019 10:58    ASSESSMENT & PLAN:   63 y.o. male with  1. 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 IgG at 2220. 08/11/16 PET/CT revealed Hypermetabolic large soft tissue mass in the lower thoracic paraspinal region associated with lytic destruction of T10 vertebral body, concerning for multiple myeloma. Additional smaller lytic lesions involving the skeleton. S/p RT x 4 fractions, discontinued due to T10 pathologic fracture with severe cord compression S/p 08/26/16 T10 corpectomy and T8-L1 posterior spinal fusion   Began 7 cycles of VRd on 09/24/16 S/p autologous stem cell transplant, Day 0 on 02/23/17 04/21/17 BM Bx with residual less than 5% lambda light chain restricted plasma cells in the bone marrow. M Spike at 0.4g Began maintenance 54m/m2 Carfilzomib every 2 weeks on 06/16/17  06/09/18 PET/CT revealed "Multifocal hypermetabolic lytic lesions in the skeleton as noted above, compatible with active myeloma. In the spine, the most notable active  lesion of concern is at the T12 level with there is a large right eccentric vertebral body lesion with demineralization of the cortex and likely some paravertebral extension of tumor. This could cause loosening of the right pedicle screw. Intraspinal extension of tumor is difficult to exclude. MRI might be considered although the posterolateral rod and pedicle screws may cause artifact. 2. There is likewise cortical breakthrough associated with the lytic lesion along the left upper acetabulum. 3. A lesion in the left intertrochanteric area of the femur is large enough to potentially cause biomechanical weakening which increases risk of fracture. 4. Other skeletal sites of active involvement are  detailed above in the skeleton section. 5. Several lucent lesions are observed including the calvarium, T1 vertebral body, and left L4 pedicle which are not hypermetabolic and may represent effectively previously treated lesions. 6. 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 There is also a lipoma along the right brachialis muscle. 7. Nonspecific subcutaneous edema especially in the right forearm but also extending up into the right upper arm. Cause is uncertain. There is some low-grade nonfocal metabolic activity along this area. Strictly speaking, cellulitis is not excluded, correlate with clinical history and visual inspection. 8. Other imaging findings of potential clinical significance: Aortic Atherosclerosis. Mild cardiomegaly. Cholelithiasis. Prostatomegaly."  06/17/18 MMP revealed M Protein at 0.5g, just before beginning C1 Daratumumab  06/30/18 BM Biopsy revealed normocellular marrow with minimal involvement by plasma cell neoplasm (5% plasma cells), normal cytogenetics.  06/30/18 Molecular Cytogenetics did not have enough material for testing.  10/13/2018 PET whole body revealed "1. Mixed response to therapy with some lesions decreased  significantly in metabolic activity and others increased in metabolic activity as well size on the CT portion exam. 2. Expansion of lytic lesion at C3 with loss of a large portion of the vertebral body structure and intense metabolic activity. Recommend non emergent neuro surgical consultation for evaluation of this destructive C3 lesion. Dedicated CT or MRI of the neck may also provide more information. 3. Increase in size and metabolic activity of RIGHT iliac lesion and T5 vertebral body lesion. 4. Marked improvement with reduction in metabolic activity of RIGHT humerus and scapular metastatic lesions as well as marked reduction in metabolic activity large LEFT iliac bone lesion. 5. No evidence of soft tissue plasmacytoma or metastasis."  #2 severe hypercalcemia likely due to progression of his multiple myeloma.  Mild confusion and significant dehydration.  #3 severe uncontrolled epistaxis likely from dry mucous membranes plus severe thrombocytopenia   PLAN: -Labs from today have been reviewed and discussed with the patient and his wife.  His white blood cell count is up to 2.6, his hemoglobin is stable at 7.6, and his platelets are stable at 45,000.  No more epistaxis noted.  No transfusion is indicated today. -Calcium is down to 11.0 today.  He is status post Zometa and several doses of IV dexamethasone.  He remains on IV hydration. -Bone marrow biopsy results have previously been discussed with the patient and his wife showing progression of multiple myeloma but 5 to 10% abnormal clonal plasma cells.  Also concerns with presence of myelodysplastic syndrome which is complicating his cytopenias. -The patient's wife is inquiring about additional treatment for his myeloma.  I again discussed with the patient and his wife that due to his declining performance status, he needs to work with PT/OT and improve his nutritional status before we can consider treatment.  He also needs to not be transfusion  dependent.  Any treatment will be given on outpatient basis.  She states understanding of this. -PT has recommended home health physical therapy -Dietitian is following -Goals of care and treatment limitations given his functional status cytopenias and aggressive nature of his myeloma have previously been discussed with the patient and his wife. -Patient and wife want to try to see if he can sustain himself and to see if his blood counts and functional status improved to be able to continue myeloma treatments. -I discussed that consideration of comfort only approach would also be reasonable if he is unable to improve  his functional status. -He was to follow-up with Dr. Norma Fredrickson at [facility] to consider clinical trial options including 1 with ibrutinib/Revlimid/dexamethasone.  These options might change since there has been a significant change in the patient's functional status. -Out of bed as much as possible. -Appreciate hospitalist care. -We will continue to follow alongside as needed. -Transfuse as needed for hemoglobin less than 7.5 and platelets less than 10k or if bleeding. -Might need to consider G-CSF support if the patient develops any signs of infection with his neutropenia.  Mikey Bussing, DNP, AGPCNP-BC, AOCNP

## 2019-01-23 NOTE — Progress Notes (Signed)
Pt is now more awake , climbing out of foot of bed - states he is going home - returned to bed with the assistance of RN &NT

## 2019-01-23 NOTE — Progress Notes (Signed)
[facility] ROUNDING NOTE   Subjective:   63 year old gentleman multiple myeloma arthritis cervical stenosis presented 01/11/2019 with nosebleed.  Followed by oncology at [facility] has severe thrombocytopenia and anemia.  Hypercalcemia was also noted and nephrology was consulted.  Received IV dexamethasone, calcitonin and Zometa and IV fluids.  Blood pressure 159/97 pulse 100 temperature 98.1 O2 sats 100% room air  Sodium 138 potassium 3.9 chloride 106 CO2 26 BUN 19 creatinine 0.75 glucose 132 calcium 11.0 phosphorus 2.2 magnesium 1.9 albumin 2.7  WBC 2.6 hemoglobin 7.6 platelets 45  Calcitonin subcu 4 units/kg x 4 doses 01/14/2019 01/15/2019 01/16/2019. Decadron 12 mg IV 01/18/2019 Decadron 8 mg 01/21/2019 Decadron 4 mg 01/22/2019 to end 01/27/2019 Zometa 4 mg IV 01/21/2019   Objective:  Vital signs in last 24 hours:  Temp:  [97.7 F (36.5 C)-98.2 F (36.8 C)] 98.1 F (36.7 C) (12/21 0541) Pulse Rate:  [86-100] 100 (12/21 0541) Resp:  [16-18] 16 (12/21 0541) BP: (131-162)/(72-97) 159/97 (12/21 0541) SpO2:  [100 %] 100 % (12/21 0541) Weight:  [54.6 kg] 54.6 kg (12/21 0500)  Weight change: 1.8 kg Filed Weights   01/21/19 0640 01/22/19 0532 01/23/19 0500  Weight: 52.9 kg 52.8 kg 54.6 kg    Intake/Output: I/O last 3 completed shifts: In: 2192.8 [P.O.:350; I.V.:1842.8] Out: 9300 [Urine:9300]   Intake/Output this shift:  No intake/output data recorded.  DJM:EQASTMHDQ somewhat confused CVS: no rub Resp: cta Abd: +BS, soft, NT/Nd Ext: no edema   Basic Metabolic Panel: Recent Labs  Lab 01/17/19 0559 01/18/19 0407 01/19/19 0249 01/20/19 0420 01/21/19 0439 01/22/19 0412 01/23/19 0348  NA 138 147*  147* 136 146* 145 137 138  K 3.4* 3.6  3.6 3.7 3.1* 3.3* 4.1 3.9  CL 108 117*  116* 106 113* 111 101 106  CO2 '26 26  26 23 29 29 29 26  ' GLUCOSE 101* 104*  103* 163* 99 97 140* 132*  BUN '12 18  18 16 16 16 15 19  ' CREATININE 0.93 0.93  0.90  0.74 0.85 0.87 0.73 0.75  CALCIUM 11.0* 12.5*  12.5* 11.1* 11.9* 13.0* 12.6* 11.0*  MG 1.8 1.8 1.4* 1.6* 1.6* 2.2 1.9  PHOS 4.3 4.1 2.3*  --   --  2.9 2.2*    Liver Function Tests: Recent Labs  Lab 01/17/19 0559 01/18/19 0407 01/22/19 0412 01/23/19 0348  ALBUMIN 2.6* 2.7* 2.5* 2.7*   No results for input(s): LIPASE, AMYLASE in the last 168 hours. No results for input(s): AMMONIA in the last 168 hours.  CBC: Recent Labs  Lab 01/19/19 0249 01/20/19 0420 01/21/19 0439 01/22/19 1541 01/23/19 0348  WBC 2.1* 1.3* 1.5* 2.4* 2.6*  HGB 8.1* 7.6* 7.4* 7.5* 7.6*  HCT 25.1* 23.6* 22.8* 23.2* 23.1*  MCV 89.0 88.4 89.4 87.9 87.8  PLT 36* 25* 17* 51* 45*    Cardiac Enzymes: No results for input(s): CKTOTAL, CKMB, CKMBINDEX, TROPONINI in the last 168 hours.  BNP: Invalid input(s): POCBNP  CBG: No results for input(s): GLUCAP in the last 168 hours.  Microbiology: Results for orders placed or performed during the hospital encounter of 01/11/19  SARS CORONAVIRUS 2 (TAT 6-24 HRS) Nasopharyngeal Nasopharyngeal Swab     Status: None   Collection Time: 01/11/19  3:32 PM   Specimen: Nasopharyngeal Swab  Result Value Ref Range Status   SARS Coronavirus 2 NEGATIVE NEGATIVE Final    Comment: (NOTE) SARS-CoV-2 target nucleic acids are NOT DETECTED. The SARS-CoV-2 RNA is generally detectable in upper and lower respiratory specimens  during the acute phase of infection. Negative results do not preclude SARS-CoV-2 infection, do not rule out co-infections with other pathogens, and should not be used as the sole basis for treatment or other patient management decisions. Negative results must be combined with clinical observations, patient history, and epidemiological information. The expected result is Negative. Fact Sheet for Patients: SugarRoll.be Fact Sheet for Healthcare Providers: https://www.woods-mathews.com/ This test is not yet approved  or cleared by the [place] FDA and  has been authorized for detection and/or diagnosis of SARS-CoV-2 by FDA under an Emergency Use Authorization (EUA). This EUA will remain  in effect (meaning this test can be used) for the duration of the COVID-19 declaration under Section 56 4(b)(1) of the Act, 21 U.S.C. section 360bbb-3(b)(1), unless the authorization is terminated or revoked sooner. Performed at [facility], [place] [address] [place], [place] [address]     Coagulation Studies: No results for input(s): LABPROT, INR in the last 72 hours.  Urinalysis: No results for input(s): COLORURINE, LABSPEC, PHURINE, GLUCOSEU, HGBUR, BILIRUBINUR, KETONESUR, PROTEINUR, UROBILINOGEN, NITRITE, LEUKOCYTESUR in the last 72 hours.  Invalid input(s): APPERANCEUR    Imaging: No results found.   Medications:   . sodium chloride 150 mL/hr at 01/23/19 0156   . bisacodyl  10 mg Oral Daily  . Chlorhexidine Gluconate Cloth  6 each Topical Daily  . dexamethasone  4 mg Oral Q12H  . docusate sodium  100 mg Oral BID  . dronabinol  10 mg Oral BID  . feeding supplement  1 Container Oral TID BM  . feeding supplement (PRO-STAT SUGAR FREE 64)  30 mL Oral BID  . morphine  30 mg Oral Q12H  . oxymetazoline  1 spray Each Nare BID  . senna-docusate  1 tablet Oral Daily  . sucralfate  1 g Oral TID AC & HS   acetaminophen, bisacodyl, docusate, methocarbamol, morphine injection, ondansetron, oxyCODONE, senna-docusate, sodium chloride flush, witch hazel-glycerin  Assessment/ Plan:   Hypercalcemia secondary to multiple myeloma.  Appears to have responded to second dose of Zometa IV.  Continues on Decadron.  Palliative medicine to help overall prognosis is poor.  Hypernatremia has improved.  Is currently on normal saline to help Calciuresis.  Is receiving 150 cc an hour.  I would continue.  Patient has great urine output 5.2 L 01/22/2019  Myotonic jerks improved  Altered mental status.  Gabapentin  discontinued  Acute kidney injury resolved   LOS: [place] '@TODAY' '@7' :44 AM

## 2019-01-23 NOTE — Progress Notes (Signed)
2050_ Pt assessment noted pt to be asleep on his rt side listening to bible verses being read aloud on his phone, pt appears peaceful. More difficult to arouse, requiring me to rub his back & call his name , slept thru me listening to heart & lungs and the tech getting VSs. Pt woke up to me elevating HOB and talking to him, he was able to squeeze my fingers bilaterally and responded to me tickling him but no verbal responses, shook his head No to offering of beverage.

## 2019-01-23 NOTE — Progress Notes (Signed)
Occupational Therapy Treatment Patient Details Name: Jesus [name] MRN: [id number] DOB: 1955-04-19 Today's Date: 01/23/2019    History of present illness Pt is 63 year old male with history of multiple myeloma, arthritis, cervical stenosis who presented with nosebleed.  Pt was found to have severe thrombocytopenia, normocytic anemia, and hypercalcemia secondary to multiple myeloma.  Today platelets are 17 (nurse report pt received platelets since this reading) and Calcium is 13   OT comments  Patient doing better today, no jerking but still having some trouble holding onto washcloth while standing at sink to wash face. Wife reports she will be with him all the time. I did make her aware she could use the 3n1 as a seat in their walk in shower at home. We will continue to follow.  Follow Up Recommendations  Home health OT;Supervision/Assistance - 24 hour    Equipment Recommendations  3 in 1 bedside commode       Precautions / Restrictions Precautions Precautions: Fall Restrictions Weight Bearing Restrictions: No       Mobility Bed Mobility               General bed mobility comments: Pt up in chair next to bed when I entered (pt's wife reports she assisted him up to it  Transfers Overall transfer level: Needs assistance Equipment used: Rolling walker (2 wheeled) Transfers: Sit to/from Stand Sit to Stand: Min guard              Balance Overall balance assessment: Needs assistance Sitting-balance support: Feet supported;No upper extremity supported Sitting balance-Leahy Scale: Fair     Standing balance support: No upper extremity supported;During functional activity Standing balance-Leahy Scale: Fair Standing balance comment: standing at sink to wash face                           ADL either performed or assessed with clinical judgement   ADL Overall ADL's : Needs assistance/impaired     Grooming: Min guard;Standing;Wash/dry face                  Lower Body Dressing Details (indicate cue type and reason): min guard A sitting in chair to adjust socks Toilet Transfer: Min guard;Ambulation;RW Toilet Transfer Details (indicate cue type and reason): chair>out of room and down hallway (100 ft)>recliner                 Vision Baseline Vision/History: Wears glasses Wears Glasses: At all times Patient Visual Report: No change from baseline            Cognition Arousal/Alertness: Awake/alert Behavior During Therapy: WFL for tasks assessed/performed Overall Cognitive Status: Within Functional Limits for tasks assessed                                 General Comments: Very low voice volume        Exercises Other Exercises Other Exercises: Noted patient dropped washcloth out of both hand several times while standing at sink. Asked wife if pt was able to feed himself today and she said yes but did say he did drop the spoon several times           Pertinent Vitals/ Pain       Pain Assessment: No/denies pain     Prior Functioning/Environment              Frequency  Min 2X/week  Progress Toward Goals  OT Goals(current goals can now be found in the care plan section)  Progress towards OT goals: Progressing toward goals     Plan Discharge plan remains appropriate       AM-PAC OT "6 Clicks" Daily Activity     Outcome Measure   Help from another person eating meals?: A Little Help from another person taking care of personal grooming?: A Little Help from another person toileting, which includes using toliet, bedpan, or urinal?: A Little Help from another person bathing (including washing, rinsing, drying)?: A Little Help from another person to put on and taking off regular upper body clothing?: A Little Help from another person to put on and taking off regular lower body clothing?: A Little 6 Click Score: 18    End of Session Equipment Utilized During Treatment: Gait  belt;Rolling walker  OT Visit Diagnosis: Unsteadiness on feet (R26.81);Muscle weakness (generalized) (M62.81)   Activity Tolerance Patient tolerated treatment well   Patient Left in chair;with call bell/phone within reach;with chair alarm set;with family/visitor present   Nurse Communication          Time: 1518-3437 OT Time Calculation (min): 23 min  Charges: OT General Charges $OT Visit: 1 Visit OT Treatments $Self Care/Home Management : 23-37 mins  Tye [place] , OTR/L [place] Pager [phone] Office [phone]      01/23/2019, 4:28 PM

## 2019-01-24 ENCOUNTER — Inpatient Hospital Stay (HOSPITAL_COMMUNITY): Payer: Medicare Other

## 2019-01-24 ENCOUNTER — Inpatient Hospital Stay (HOSPITAL_COMMUNITY)
Admit: 2019-01-24 | Discharge: 2019-01-24 | Disposition: A | Discharge status: Home / Self Care | Payer: Medicare Other | Attending: Internal Medicine | Admitting: Internal Medicine

## 2019-01-24 DIAGNOSIS — R4182 Altered mental status, unspecified: Secondary | ICD-10-CM

## 2019-01-24 LAB — BASIC METABOLIC PANEL
Anion gap: 6 (ref 5–15)
BUN: 14 mg/dL (ref 8–23)
CO2: 27 mmol/L (ref 22–32)
Calcium: 11 mg/dL — ABNORMAL HIGH (ref 8.9–10.3)
Chloride: 106 mmol/L (ref 98–111)
Creatinine, Ser: 0.68 mg/dL (ref 0.61–1.24)
GFR calc Af Amer: >60 mL/min (ref >60)
GFR calc non Af Amer: >60 mL/min (ref >60)
Glucose, Bld: 98 mg/dL (ref 70–99)
Potassium: 2.9 mmol/L — ABNORMAL LOW (ref 3.5–5.1)
Sodium: 139 mmol/L (ref 135–145)

## 2019-01-24 LAB — RENAL FUNCTION PANEL
Albumin: 2.6 g/dL — ABNORMAL LOW (ref 3.5–5.0)
Anion gap: 7 (ref 5–15)
BUN: 15 mg/dL (ref 8–23)
CO2: 26 mmol/L (ref 22–32)
Calcium: 10.8 mg/dL — ABNORMAL HIGH (ref 8.9–10.3)
Chloride: 105 mmol/L (ref 98–111)
Creatinine, Ser: 0.66 mg/dL (ref 0.61–1.24)
GFR calc Af Amer: >60 mL/min (ref >60)
GFR calc non Af Amer: >60 mL/min (ref >60)
Glucose, Bld: 102 mg/dL — ABNORMAL HIGH (ref 70–99)
Phosphorus: 2.7 mg/dL (ref 2.5–4.6)
Potassium: 2.9 mmol/L — ABNORMAL LOW (ref 3.5–5.1)
Sodium: 138 mmol/L (ref 135–145)

## 2019-01-24 LAB — URINALYSIS, ROUTINE W REFLEX MICROSCOPIC
Bilirubin Urine: NEGATIVE
Glucose, UA: NEGATIVE mg/dL
Hgb urine dipstick: NEGATIVE
Ketones, ur: NEGATIVE mg/dL
Nitrite: NEGATIVE
Protein, ur: NEGATIVE mg/dL
Specific Gravity, Urine: 1.006 (ref 1.005–1.030)
pH: 7 (ref 5.0–8.0)

## 2019-01-24 LAB — CBC
HCT: 23.5 % — ABNORMAL LOW (ref 39.0–52.0)
Hemoglobin: 7.6 g/dL — ABNORMAL LOW (ref 13.0–17.0)
MCH: 28.6 pg (ref 26.0–34.0)
MCHC: 32.3 g/dL (ref 30.0–36.0)
MCV: 88.3 fL (ref 80.0–100.0)
Platelets: 31 10*3/uL — ABNORMAL LOW (ref 150–400)
RBC: 2.66 MIL/uL — ABNORMAL LOW (ref 4.22–5.81)
RDW: 19.8 % — ABNORMAL HIGH (ref 11.5–15.5)
WBC: 2.3 10*3/uL — ABNORMAL LOW (ref 4.0–10.5)
nRBC: 0.9 % — ABNORMAL HIGH (ref 0.0–0.2)

## 2019-01-24 LAB — MAGNESIUM: Magnesium: 1.9 mg/dL (ref 1.7–2.4)

## 2019-01-24 MED ORDER — POTASSIUM CHLORIDE CRYS ER 20 MEQ PO TBCR
40.0000 meq | EXTENDED_RELEASE_TABLET | Freq: Once | ORAL | Status: AC
  Administered 2019-01-24: 40 meq via ORAL
  Filled 2019-01-24: qty 2

## 2019-01-24 MED ORDER — POTASSIUM CHLORIDE 10 MEQ/100ML IV SOLN
INTRAVENOUS | Status: AC
  Filled 2019-01-24: qty 100

## 2019-01-24 MED ORDER — POTASSIUM CHLORIDE 10 MEQ/100ML IV SOLN
10.0000 meq | INTRAVENOUS | Status: AC
  Administered 2019-01-24 (×3): 10 meq via INTRAVENOUS
  Filled 2019-01-24 (×2): qty 100

## 2019-01-24 MED ORDER — METOPROLOL TARTRATE 5 MG/5ML IV SOLN
5.0000 mg | Freq: Three times a day (TID) | INTRAVENOUS | Status: DC
  Administered 2019-01-24 – 2019-01-27 (×9): 5 mg via INTRAVENOUS
  Filled 2019-01-24 (×9): qty 5

## 2019-01-24 MED ORDER — POTASSIUM CHLORIDE 10 MEQ/100ML IV SOLN
10.0000 meq | INTRAVENOUS | Status: AC
  Administered 2019-01-24 (×4): 10 meq via INTRAVENOUS
  Filled 2019-01-24 (×2): qty 100

## 2019-01-24 NOTE — Progress Notes (Signed)
SLP Cancellation Note  Patient Details Name: Jesus [name] MRN: [id number] DOB: 1955/05/16   Cancelled treatment:       Reason Eval/Treat Not Completed: Fatigue/lethargy limiting ability to participate(RN and NT report pt with agitation overnight and poor sleep, currently resting, will continue efforts)   [name] 01/24/2019, 12:53 PM  Kathleen Lime, MS [place] Office [phone]

## 2019-01-24 NOTE — Progress Notes (Signed)
[facility] ROUNDING NOTE   Subjective:   63 year old gentleman multiple myeloma arthritis cervical stenosis presented 01/11/2019 with nosebleed.  Followed by oncology at [facility] has severe thrombocytopenia and anemia.  Hypercalcemia was also noted and nephrology was consulted.  Received IV dexamethasone, calcitonin and Zometa and IV fluids.  Blood pressure 154/93 pulse 109 temperature 98.2 O2 sats 100% room air  Sodium 138 potassium 2.9 chloride 105 CO2 26 BUN 15 creatinine 0.66 glucose 102 calcium 10.8 phosphorus 2.7 albumin 2.6 WBC 2.3 hemoglobin 7.6 platelets 31  Urine output 6.1 L  Calcitonin subcu 4 units/kg x 4 doses 01/14/2019 01/15/2019 01/16/2019. Decadron 12 mg IV 01/18/2019 Decadron 8 mg 01/21/2019 Decadron 4 mg 01/22/2019 to end 01/27/2019 Zometa 4 mg IV 01/21/2019   Objective:  Vital signs in last 24 hours:  Temp:  [97.8 F (36.6 C)-98.7 F (37.1 C)] 98.2 F (36.8 C) (12/22 0526) Pulse Rate:  [87-117] 109 (12/22 0526) Resp:  [14-17] 16 (12/22 0526) BP: (124-161)/(84-101) 154/93 (12/22 0526) SpO2:  [98 %-100 %] 100 % (12/22 0526)  Weight change:  Filed Weights   01/21/19 0640 01/22/19 0532 01/23/19 0500  Weight: 52.9 kg 52.8 kg 54.6 kg    Intake/Output: I/O last 3 completed shifts: In: 6045.7 [P.O.:1535; I.V.:4510.7] Out: 10700 [Urine:10700]   Intake/Output this shift:  Total I/O In: -  Out: 700 [Urine:700]  MOL:[id number] somewhat confused CVS: no rub Resp: cta Abd: +BS, soft, NT/Nd Ext: no edema   Basic Metabolic Panel: Recent Labs  Lab 01/18/19 0407 01/19/19 0249 01/20/19 0420 01/21/19 0439 01/22/19 0412 01/23/19 0348 01/24/19 0358 01/24/19 0400  NA 147*  147* 136 146* 145 137 138 139 138  K 3.6  3.6 3.7 3.1* 3.3* 4.1 3.9 2.9* 2.9*  CL 117*  116* 106 113* 111 101 106 106 105  CO2 _0 GLUCOSE 104*  103* 163* 99 97 140* 132* 98 102*  BUN _1 CREATININE 0.93   0.90 0.74 0.85 0.87 0.73 0.75 0.68 0.66  CALCIUM 12.5*  12.5* 11.1* 11.9* 13.0* 12.6* 11.0* 11.0* 10.8*  MG 1.8 1.4* 1.6* 1.6* 2.2 1.9 1.9  --   PHOS 4.1 2.3*  --   --  2.9 2.2*  --  2.7    Liver Function Tests: Recent Labs  Lab 01/18/19 0407 01/22/19 0412 01/23/19 0348 01/24/19 0400  ALBUMIN 2.7* 2.5* 2.7* 2.6*   No results for input(s): LIPASE, AMYLASE in the last 168 hours. No results for input(s): AMMONIA in the last 168 hours.  CBC: Recent Labs  Lab 01/20/19 0420 01/21/19 0439 01/22/19 1541 01/23/19 0348 01/24/19 0358  WBC 1.3* 1.5* 2.4* 2.6* 2.3*  HGB 7.6* 7.4* 7.5* 7.6* 7.6*  HCT 23.6* 22.8* 23.2* 23.1* 23.5*  MCV 88.4 89.4 87.9 87.8 88.3  PLT 25* 17* 51* 45* 31*    Cardiac Enzymes: No results for input(s): CKTOTAL, CKMB, CKMBINDEX, TROPONINI in the last 168 hours.  BNP: Invalid input(s): POCBNP  CBG: No results for input(s): GLUCAP in the last 168 hours.  Microbiology: Results for orders placed or performed during the hospital encounter of 01/11/19  SARS CORONAVIRUS 2 (TAT 6-24 HRS) Nasopharyngeal Nasopharyngeal Swab     Status: None   Collection Time: 01/11/19  3:32 PM   Specimen: Nasopharyngeal Swab  Result Value Ref Range Status   SARS Coronavirus 2 NEGATIVE NEGATIVE Final    Comment: (NOTE) SARS-CoV-2 target nucleic acids are  NOT DETECTED. The SARS-CoV-2 RNA is generally detectable in upper and lower respiratory specimens during the acute phase of infection. Negative results do not preclude SARS-CoV-2 infection, do not rule out co-infections with other pathogens, and should not be used as the sole basis for treatment or other patient management decisions. Negative results must be combined with clinical observations, patient history, and epidemiological information. The expected result is Negative. Fact Sheet for Patients: SugarRoll.be Fact Sheet for Healthcare  Providers: https://www.woods-mathews.com/ This test is not yet approved or cleared by the [place] FDA and  has been authorized for detection and/or diagnosis of SARS-CoV-2 by FDA under an Emergency Use Authorization (EUA). This EUA will remain  in effect (meaning this test can be used) for the duration of the COVID-19 declaration under Section 56 4(b)(1) of the Act, 21 U.S.C. section 360bbb-3(b)(1), unless the authorization is terminated or revoked sooner. Performed at [facility], [place] [address] [place], [place] [address]     Coagulation Studies: No results for input(s): LABPROT, INR in the last 72 hours.  Urinalysis: No results for input(s): COLORURINE, LABSPEC, PHURINE, GLUCOSEU, HGBUR, BILIRUBINUR, KETONESUR, PROTEINUR, UROBILINOGEN, NITRITE, LEUKOCYTESUR in the last 72 hours.  Invalid input(s): APPERANCEUR    Imaging: No results found.   Medications:   . sodium chloride 150 mL/hr at 01/24/19 0446  . potassium chloride 10 mEq (01/24/19 0922)   . bisacodyl  10 mg Oral Daily  . Chlorhexidine Gluconate Cloth  6 each Topical Daily  . [START ON 01/25/2019] dexamethasone  4 mg Oral Q12H  . docusate sodium  100 mg Oral BID  . dronabinol  10 mg Oral BID  . feeding supplement  1 Container Oral TID BM  . feeding supplement (PRO-STAT SUGAR FREE 64)  30 mL Oral BID  . morphine  30 mg Oral Q12H  . oxymetazoline  1 spray Each Nare BID  . senna-docusate  1 tablet Oral Daily  . sucralfate  1 g Oral TID AC & HS   acetaminophen, bisacodyl, docusate, LORazepam, methocarbamol, ondansetron, oxyCODONE, senna-docusate, sodium chloride flush, witch hazel-glycerin  Assessment/ Plan:   Hypercalcemia secondary to multiple myeloma.  Appears to have responded to second dose of Zometa IV.  Continues on Decadron.  Palliative medicine to help overall prognosis is poor.  Hypernatremia has improved.  Is currently on normal saline to help Calciuresis.  Is receiving 150 cc  an hour.  I would continue.  Patient has great urine output    Hypokalemia will replete  Myotonic jerks improved  Altered mental status.  Gabapentin discontinued  Acute kidney injury resolved   LOS: [place] _0 _1 :61 AM

## 2019-01-24 NOTE — Progress Notes (Signed)
EEG complete - results pending 

## 2019-01-24 NOTE — Progress Notes (Signed)
Pr has gradually become more restless ( getting out of bed, pulling condom off, sitting dazed on side of bed) having a hard time telling me what he wants/needs. Hand mits have now been applied

## 2019-01-24 NOTE — Progress Notes (Signed)
Palliative Care Follow-up  Jesus [name] has declined since I saw him over the weekend. He is currently very lethargic,easily agitated and encephalopathic. His wife is not at the bedside and I have attempted to call her. Will need to readdress his goals given his current status-likely progression of cancer, persistent hypercalcemia, possibly steroid induced agitation and residual sedation. Code status clarification is a critical decision and how to proceed should his condition get acutely worse. Will try her again by phone this evening.  Lane Hacker, DO Palliative Medicine [phone]

## 2019-01-24 NOTE — Progress Notes (Signed)
50 cent piece sized bruise to L hip/pelvic area.  Wife concerned how pt obtained bruise because states did not have yesterday when she bathed him.  Explained to wife unsure of how obtained bruise, however pt has severe low platelet count and was attempting to get out of bed last night on several occasions and may have bruised hip at that time.  No further issues per wife.

## 2019-01-24 NOTE — Progress Notes (Signed)
Landis Gandy, MD paged regarding the pt's need for a safety sitter. All morning, the pt has been confused, trying to get out of bed, and pulling at port. Mittens on. All fall risk precautions continued.

## 2019-01-24 NOTE — Progress Notes (Signed)
Landis Gandy, MD advised to give PRN ativan early d/t agitation and restlessness. Ativan just given. Will CTM.

## 2019-01-24 NOTE — Progress Notes (Signed)
Jesus [name]  DPO:[id number] DOB: Jun 28, 1955 DOA: 01/11/2019 PCP: Patient, No Pcp Per    Brief Narrative: 63 year old male with history of multiple myeloma, arthritis, cervical stenosis who presented with nosebleed. He had a similar episode about 2 weeks ago which lasted for 8 hours but spontaneously resolved. He follows with oncologist at [facility] to have severe thrombocytopenia at presentation and also has normocytic anemia. Hematology/oncology consulted. Nosebleed has stopped. Being transfused today for anemia. Also found to have hypercalcemia secondary to [facility] was given a dose of Zometa and Decadron  Assessment & Plan:   Active Problems:   Hypercalcemia   Epistaxis   Thrombocytopenia (HCC)   Anemia   Leukopenia    #1hypercalcemia secondary to progressive multiple myeloma-s/p zometax2,decadron and calcitoninx4 and IV fluids Previous attending discussed with oncology who recommended Decadron 8 mg twice daily for 1 day followed by 4 mg twice daily. Intranasal calcitonin was not given due to ongoing epistaxis and pancytopenia. Calcium 10.8 down from 11.0 down from 12.6 down from 13 on 01/21/2019 with Decadron and ongoing IV fluids. However with increasing confusion and delirium trying to jump out of bed pulling out IV lines Decadron has been stopped since 12/21   #2 hypernatremia resolved sodium 138.  Continue normal saline.    #3 myoclonic jerking almost resolved secondary to gabapentin which has been stopped.    #4 AKI resolved creatinine 0.66  #5 hypokalemia potassium 2.9 patient will not be able to take p.o. replacements due to change in mental status and alertness.  Will give him IV potassium replacement.   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 Bone scan showed uptake over the right acetabulum mid to lower thoracic spine  upper anterior ribs and likely cervicothoracic junction. Recent C3 compression fracture status post corpectomy C2-C4 plating.   #8 epistaxis seems to have resolved  #9 constipation continue Colace and Dulcolax expected with hypercalcemia.  Patient had bowel movement yesterday after smog enema.  #10 severe protein calorie malnutrition dietary consulted  #11 goals of care patient remains a full code he has progressive multiple myeloma with poor prognosis  #12 acute delirium likely multifactorial.  Decadron stopped thinking this could be steroid induced psychosis.  Rule out infections check UA.  Hold narcotics. ?  Check CT head check for any mets and bleed with thrombocytopenia EEG to rule out seizures ?  Terminal delirium secondary to progressive multiple myeloma      Nutrition Problem: Increased nutrient needs Etiology: cancer and cancer related treatments     Signs/Symptoms: estimated needs    Interventions: Boost Breeze, Prostat  Estimated body mass index is 18.85 kg/m as calculated from the following:   Height as of this encounter: 5' 7.01" (1.702 m).   Weight as of this encounter: 54.6 kg.  DVT prophylaxis:SCD Code Status:Full Family Communication:Discussed with his wife who is concerned that he is not on cancer treatment.  Disposition Plan:Pending clinical improvement Consultants: Oncology, nephrology, palliative care  Procedures:bone marrow biopsy by IR   Antimicrobials:None   Subjective:  Patient restless had to receive Ativan earlier this morning due to increased delirium trying to jump out of bed pulling IV lines Objective: Vitals:   01/23/19 2300 01/24/19 0241 01/24/19 0449 01/24/19 0526  BP:  (!) 154/101 (!) 161/91 (!) 154/93  Pulse: 100 (!) 104 (!) 106 (!) 109  Resp:  17 14 16  Temp:  98.4 F (36.9 C) 98.7 F (37.1 C) 98.2 F (36.8 C)  TempSrc:  Oral Oral Oral  SpO2:  100% 100% 100%  Weight:      Height:         Intake/Output Summary (Last 24 hours) at 01/24/2019 1029 Last data filed at 01/24/2019 0810 Gross per 24 hour  Intake 5690.74 ml  Output 6200 ml  Net -509.26 ml   Filed Weights   01/21/19 0640 01/22/19 0532 01/23/19 0500  Weight: 52.9 kg 52.8 kg 54.6 kg    Examination:  General exam: Appears restless delirious confused does not answer any questions appropriately Respiratory system: Clear to auscultation. Respiratory effort normal.  Right upper chest port in place Cardiovascular system: S1 & S2 heard, RRR. No JVD, murmurs, rubs, gallops or clicks. No pedal edema. Gastrointestinal system: Abdomen is nondistended, soft and nontender. No organomegaly or masses felt. Normal bowel sounds heard. Central nervous system: Confused moves all extremities extremities: Symmetric 5 x 5 power. Skin: No rashes, lesions or ulcers Psychiatry: Unable to assess due to confusion  Data Reviewed: I have personally reviewed following labs and imaging studies  CBC: Recent Labs  Lab 01/20/19 0420 01/21/19 0439 01/22/19 1541 01/23/19 0348 01/24/19 0358  WBC 1.3* 1.5* 2.4* 2.6* 2.3*  HGB 7.6* 7.4* 7.5* 7.6* 7.6*  HCT 23.6* 22.8* 23.2* 23.1* 23.5*  MCV 88.4 89.4 87.9 87.8 88.3  PLT 25* 17* 51* 45* 31*   Basic Metabolic Panel: Recent Labs  Lab 01/18/19 0407 01/19/19 0249 01/20/19 0420 01/21/19 0439 01/22/19 0412 01/23/19 0348 01/24/19 0358 01/24/19 0400  NA 147*  147* 136 146* 145 137 138 139 138  K 3.6  3.6 3.7 3.1* 3.3* 4.1 3.9 2.9* 2.9*  CL 117*  116* 106 113* 111 101 106 106 105  CO2 '26  26 23 29 29 29 26 27 26  ' GLUCOSE 104*  103* 163* 99 97 140* 132* 98 102*  BUN '18  18 16 16 16 15 19 14 15  ' CREATININE 0.93  0.90 0.74 0.85 0.87 0.73 0.75 0.68 0.66  CALCIUM 12.5*  12.5* 11.1* 11.9* 13.0* 12.6* 11.0* 11.0* 10.8*  MG 1.8 1.4* 1.6* 1.6* 2.2 1.9 1.9  --   PHOS 4.1 2.3*  --   --  2.9 2.2*  --  2.7   GFR: Estimated Creatinine Clearance: 73 mL/min (by C-G formula based on SCr  of 0.66 mg/dL). Liver Function Tests: Recent Labs  Lab 01/18/19 0407 01/22/19 0412 01/23/19 0348 01/24/19 0400  ALBUMIN 2.7* 2.5* 2.7* 2.6*   No results for input(s): LIPASE, AMYLASE in the last 168 hours. No results for input(s): AMMONIA in the last 168 hours. Coagulation Profile: No results for input(s): INR, PROTIME in the last 168 hours. Cardiac Enzymes: No results for input(s): CKTOTAL, CKMB, CKMBINDEX, TROPONINI in the last 168 hours. BNP (last 3 results) No results for input(s): PROBNP in the last 8760 hours. HbA1C: No results for input(s): HGBA1C in the last 72 hours. CBG: No results for input(s): GLUCAP in the last 168 hours. Lipid Profile: No results for input(s): CHOL, HDL, LDLCALC, TRIG, CHOLHDL, LDLDIRECT in the last 72 hours. Thyroid Function Tests: No results for input(s): TSH, T4TOTAL, FREET4, T3FREE, THYROIDAB in the last 72 hours. Anemia Panel: No results for input(s): VITAMINB12, FOLATE, FERRITIN, TIBC, IRON, RETICCTPCT in the last 72 hours. Sepsis Labs: No results for input(s): PROCALCITON, LATICACIDVEN in the last 168 hours.  No results found for this or any previous visit (from the past 240  hour(s)).       Radiology Studies: No results found.      Scheduled Meds: . bisacodyl  10 mg Oral Daily  . Chlorhexidine Gluconate Cloth  6 each Topical Daily  . [START ON 01/25/2019] dexamethasone  4 mg Oral Q12H  . docusate sodium  100 mg Oral BID  . dronabinol  10 mg Oral BID  . feeding supplement  1 Container Oral TID BM  . feeding supplement (PRO-STAT SUGAR FREE 64)  30 mL Oral BID  . morphine  30 mg Oral Q12H  . oxymetazoline  1 spray Each Nare BID  . senna-docusate  1 tablet Oral Daily  . sucralfate  1 g Oral TID AC & HS   Continuous Infusions: . sodium chloride 150 mL/hr at 01/24/19 0446  . potassium chloride 10 mEq (01/24/19 0922)  . potassium chloride       LOS: 12 days      Georgette Shell, MD [facility]  If  7PM-7AM, please contact night-coverage [email or web address] Password [facility] 01/24/2019, 10:29 AM

## 2019-01-24 NOTE — Procedures (Signed)
Patient Name: Jesus [name]  MRN: [id number]  Epilepsy Attending: Lora Havens  Referring Physician/Provider: Dr Jacki Cones Date: 01/24/2019 Duration: 22.09 mins  Patient history: 63yo M with ams and jerking. EEG to evaluate for seizure  Level of alertness: awake  AEDs during EEG study: Lorazepam  Technical aspects: This EEG study was done with scalp electrodes positioned according to the 10-20 International system of electrode placement. Electrical activity was acquired at a sampling rate of 500Hz  and reviewed with a high frequency filter of 70Hz  and a low frequency filter of 1Hz . EEG data were recorded continuously and digitally stored.   DESCRIPTION: No clear posterior dominant rhythm was seen. EEG showed continuous generalized 3-5hz  theta-delta slowing. Hyperventilation and photic stimulation were not performed.  ABNORMALITY - Continuous slow, generalized   IMPRESSION: This study is suggestive of moderate diffuse encephalopathy, non specific to etiology. No seizures or epileptiform discharges were seen throughout the recording.  Jesus [name]

## 2019-01-25 ENCOUNTER — Encounter (HOSPITAL_COMMUNITY): Payer: Self-pay | Admitting: Hematology

## 2019-01-25 LAB — CBC
HCT: 21.6 % — ABNORMAL LOW (ref 39.0–52.0)
Hemoglobin: 7 g/dL — ABNORMAL LOW (ref 13.0–17.0)
MCH: 28.8 pg (ref 26.0–34.0)
MCHC: 32.4 g/dL (ref 30.0–36.0)
MCV: 88.9 fL (ref 80.0–100.0)
Platelets: 20 10*3/uL — CL (ref 150–400)
RBC: 2.43 MIL/uL — ABNORMAL LOW (ref 4.22–5.81)
RDW: 20.2 % — ABNORMAL HIGH (ref 11.5–15.5)
WBC: 1.5 10*3/uL — ABNORMAL LOW (ref 4.0–10.5)
nRBC: 1.4 % — ABNORMAL HIGH (ref 0.0–0.2)

## 2019-01-25 LAB — RENAL FUNCTION PANEL
Albumin: 2.2 g/dL — ABNORMAL LOW (ref 3.5–5.0)
Anion gap: 5 (ref 5–15)
BUN: 12 mg/dL (ref 8–23)
CO2: 26 mmol/L (ref 22–32)
Calcium: 11.4 mg/dL — ABNORMAL HIGH (ref 8.9–10.3)
Chloride: 107 mmol/L (ref 98–111)
Creatinine, Ser: 0.64 mg/dL (ref 0.61–1.24)
GFR calc Af Amer: >60 mL/min (ref >60)
GFR calc non Af Amer: >60 mL/min (ref >60)
Glucose, Bld: 93 mg/dL (ref 70–99)
Phosphorus: 3.6 mg/dL (ref 2.5–4.6)
Potassium: 4 mmol/L (ref 3.5–5.1)
Sodium: 138 mmol/L (ref 135–145)

## 2019-01-25 LAB — PREPARE RBC (CROSSMATCH)

## 2019-01-25 LAB — MAGNESIUM: Magnesium: 1.9 mg/dL (ref 1.7–2.4)

## 2019-01-25 MED ORDER — FUROSEMIDE 10 MG/ML IJ SOLN
20.0000 mg | Freq: Four times a day (QID) | INTRAMUSCULAR | Status: DC
  Administered 2019-01-25 – 2019-01-28 (×12): 20 mg via INTRAVENOUS
  Filled 2019-01-25 (×12): qty 2

## 2019-01-25 MED ORDER — FUROSEMIDE 10 MG/ML IJ SOLN: 80.0000 mg | Freq: Four times a day (QID) | INTRAMUSCULAR | Status: DC

## 2019-01-25 MED ORDER — ACETAMINOPHEN 325 MG PO TABS
650.0000 mg | ORAL_TABLET | Freq: Once | ORAL | Status: AC
  Administered 2019-01-25: 650 mg via ORAL
  Filled 2019-01-25: qty 2

## 2019-01-25 MED ORDER — SODIUM CHLORIDE 0.9% IV SOLUTION: Freq: Once | INTRAVENOUS | Status: AC

## 2019-01-25 MED ORDER — DIPHENHYDRAMINE HCL 25 MG PO CAPS
25.0000 mg | ORAL_CAPSULE | Freq: Once | ORAL | Status: AC
  Administered 2019-01-25: 25 mg via ORAL
  Filled 2019-01-25: qty 1

## 2019-01-25 NOTE — Progress Notes (Signed)
PROGRESS NOTE    Jesus [name]  ZGY:[id number] DOB: 1955-09-28 DOA: 01/11/2019 PCP: Patient, No Pcp Per    Brief Narrative: 63 year old male with history of multiple myeloma, arthritis, cervical stenosis who presented with nosebleed. He had a similar episode about 2 weeks ago which lasted for 8 hours but spontaneously resolved. He follows with oncologist at [place] to have severe thrombocytopenia at presentation and also has normocytic anemia. Hematology/oncology consulted. Nosebleed has stopped. Being transfused today for anemia. Also found to have hypercalcemia secondary to [facility] was given a dose of Zometa and Decadron  Assessment & Plan:   Active Problems:   Hypercalcemia   Epistaxis   Thrombocytopenia (HCC)   Anemia   Leukopenia   #1hypercalcemia secondary to progressive multiple myeloma-s/p zometax2,decadron and calcitoninx4 and IV fluids Previous attending discussed with oncology who recommended Decadron 8 mg twice daily for 1 day followed by 4 mg twice daily.  Patient was on Decadron but had to stop it due to steroid induced psychosis.  His mental status has improved after stopping the Decadron. Intranasal calcitonin was not given due to ongoing epistaxis and pancytopenia. Calcium  11.4 up from 10.8 down from11.0 down from12.6 down from 13 on 01/21/2019 with Decadron and ongoing IV fluids. However with increasing confusion and delirium trying to jump out of bed pulling out IV lines Decadron has been stopped since 12/21   #2 hypernatremia resolved sodium 138 continue normal saline.   #3 myoclonic jerkingalmost resolved secondary to gabapentin which has been stopped.   #4 AKI resolved creatinine 0.64  #5 hypokalemia resolved potassium 4.0   #6 pancytopenia hemoglobin 7.0 from 7.6 platelets further down to 20 from 31 yesterday.  White count is only 1.5.   We will transfuse platelets.  Follow-up tomorrow.  #7 progressive multiple myeloma by  bone marrow biopsy with concern for concomitant MDS. Bone scan showed uptake over the right acetabulum mid to lower thoracic spine upper anterior ribs and likely cervicothoracic junction. Recent C3 compression fracture status post corpectomy C2-C4 plating.   #8 epistaxis seems to have resolved  #9 constipation continue Colace and Dulcolax expected with hypercalcemia. Patient had bowel movement yesterday after smog enema.  #10 severe protein calorie malnutrition dietary consulted  #11 goals of care patient remains a full code he has progressive multiple myeloma with poor prognosis  #12 acute delirium likely multifactorial. -Proved after stopping Decadron.  EEG showed generalized encephalopathy.  No evidence of epileptiform activities.  CT head shows no acute changes.    Nutrition Problem: Increased nutrient needs Etiology: cancer and cancer related treatments     Signs/Symptoms: estimated needs    Interventions: Boost Breeze, Prostat  Estimated body mass index is 18.81 kg/m as calculated from the following:   Height as of this encounter: 5' 7.01" (1.702 m).   Weight as of this encounter: 54.5 kg.  DVT prophylaxis:SCD Code Status:Full Family Communication:Discussed with his wife who is concerned that he is not on cancer treatment.  Disposition Plan:Pending clinical improvement Consultants: Oncology, nephrology, palliative care  Procedures:bone marrow biopsy by IR   Antimicrobials:None  Subjective: Patient is resting in bed much more calmer than yesterday he knows he is in the hospital he knows who is the president of the country now he does not know who is the next president.  He denies any pain  Objective: Vitals:   01/25/19 1250 01/25/19 1303 01/25/19 1318 01/25/19 1318  BP: (!) 108/57 (!) 108/57 (!) 105/59 (!) 105/59  Pulse: (!) 109 [name])  106 (!) 105 (!) 105  Resp:  '20 20 20  ' Temp: 97.6 F (36.4 C) 97.6 F (36.4 C) 97.9 F (36.6 C) 97.9 F (36.6  C)  TempSrc: Oral Oral  Oral  SpO2: 100% 100%  100%  Weight:      Height:        Intake/Output Summary (Last 24 hours) at 01/25/2019 1450 Last data filed at 01/25/2019 0856 Gross per 24 hour  Intake 2156.32 ml  Output 5700 ml  Net -3543.68 ml   Filed Weights   01/22/19 0532 01/23/19 0500 01/25/19 0853  Weight: 52.8 kg 54.6 kg 54.5 kg    Examination:  General exam: Appears calm and comfortable  Respiratory system: Clear to auscultation. Respiratory effort normal.  Port in the right upper chest no erythema edema noted Cardiovascular system: S1 & S2 heard, RRR. No JVD, murmurs, rubs, gallops or clicks. No pedal edema. Gastrointestinal system: Abdomen is nondistended, soft and nontender. No organomegaly or masses felt. Normal bowel sounds heard. Central nervous system: Alert and oriented. No focal neurological deficits. Extremities: Symmetric 5 x 5 power. Skin: No rashes, lesions or ulcers Psychiatry: Judgement and insight appear normal. Mood & affect appropriate.     Data Reviewed: I have personally reviewed following labs and imaging studies  CBC: Recent Labs  Lab 01/21/19 0439 01/22/19 1541 01/23/19 0348 01/24/19 0358 01/25/19 0438  WBC 1.5* 2.4* 2.6* 2.3* 1.5*  HGB 7.4* 7.5* 7.6* 7.6* 7.0*  HCT 22.8* 23.2* 23.1* 23.5* 21.6*  MCV 89.4 87.9 87.8 88.3 88.9  PLT 17* 51* 45* 31* 20*   Basic Metabolic Panel: Recent Labs  Lab 01/19/19 0249 01/21/19 0439 01/22/19 0412 01/23/19 0348 01/24/19 0358 01/24/19 0400 01/25/19 0438  NA 136 145 137 138 139 138 138  K 3.7 3.3* 4.1 3.9 2.9* 2.9* 4.0  CL 106 111 101 106 106 105 107  CO2 '23 29 29 26 27 26 26  ' GLUCOSE 163* 97 140* 132* 98 102* 93  BUN '16 16 15 19 14 15 12  ' CREATININE 0.74 0.87 0.73 0.75 0.68 0.66 0.64  CALCIUM 11.1* 13.0* 12.6* 11.0* 11.0* 10.8* 11.4*  MG 1.4* 1.6* 2.2 1.9 1.9  --  1.9  PHOS 2.3*  --  2.9 2.2*  --  2.7 3.6   GFR: Estimated Creatinine Clearance: 72.9 mL/min (by C-G formula based on SCr  of 0.64 mg/dL). Liver Function Tests: Recent Labs  Lab 01/22/19 0412 01/23/19 0348 01/24/19 0400 01/25/19 0438  ALBUMIN 2.5* 2.7* 2.6* 2.2*   No results for input(s): LIPASE, AMYLASE in the last 168 hours. No results for input(s): AMMONIA in the last 168 hours. Coagulation Profile: No results for input(s): INR, PROTIME in the last 168 hours. Cardiac Enzymes: No results for input(s): CKTOTAL, CKMB, CKMBINDEX, TROPONINI in the last 168 hours. BNP (last 3 results) No results for input(s): PROBNP in the last 8760 hours. HbA1C: No results for input(s): HGBA1C in the last 72 hours. CBG: No results for input(s): GLUCAP in the last 168 hours. Lipid Profile: No results for input(s): CHOL, HDL, LDLCALC, TRIG, CHOLHDL, LDLDIRECT in the last 72 hours. Thyroid Function Tests: No results for input(s): TSH, T4TOTAL, FREET4, T3FREE, THYROIDAB in the last 72 hours. Anemia Panel: No results for input(s): VITAMINB12, FOLATE, FERRITIN, TIBC, IRON, RETICCTPCT in the last 72 hours. Sepsis Labs: No results for input(s): PROCALCITON, LATICACIDVEN in the last 168 hours.  No results found for this or any previous visit (from the past 240 hour(s)).  Radiology Studies: CT HEAD WO CONTRAST  Result Date: 01/24/2019 CLINICAL DATA:  Delirium EXAM: CT HEAD WITHOUT CONTRAST TECHNIQUE: Contiguous axial images were obtained from the base of the skull through the vertex without intravenous contrast. COMPARISON:  None. FINDINGS: Brain: No evidence of acute infarction, hemorrhage, hydrocephalus, extra-axial collection or mass lesion/mass effect. Vascular: Negative for hyperdense vessel Skull: Negative Sinuses/Orbits: Paranasal sinuses clear. Negative orbit bilaterally. Bilateral cataract surgery. Other: None IMPRESSION: Negative CT head.  No acute abnormality Electronically Signed   By: Franchot Gallo M.D.   On: 01/24/2019 14:38   EEG adult  Result Date: 01/24/2019 Lora Havens, MD     01/24/2019   6:58 PM Patient Name: Jesus [name] MRN: [id number] Epilepsy Attending: Lora Havens Referring Physician/Provider: Dr Jacki Cones Date: 01/24/2019 Duration: 22.09 mins Patient history: 63yo M with ams and jerking. EEG to evaluate for seizure Level of alertness: awake AEDs during EEG study: Lorazepam Technical aspects: This EEG study was done with scalp electrodes positioned according to the 10-20 International system of electrode placement. Electrical activity was acquired at a sampling rate of '500Hz'  and reviewed with a high frequency filter of '70Hz'  and a low frequency filter of '1Hz' . EEG data were recorded continuously and digitally stored. DESCRIPTION: No clear posterior dominant rhythm was seen. EEG showed continuous generalized 3-'5hz'  theta-delta slowing. Hyperventilation and photic stimulation were not performed. ABNORMALITY - Continuous slow, generalized IMPRESSION: This study is suggestive of moderate diffuse encephalopathy, non specific to etiology. No seizures or epileptiform discharges were seen throughout the recording. Priyanka Barbra Sarks        Scheduled Meds: . bisacodyl  10 mg Oral Daily  . Chlorhexidine Gluconate Cloth  6 each Topical Daily  . docusate sodium  100 mg Oral BID  . dronabinol  10 mg Oral BID  . feeding supplement  1 Container Oral TID BM  . feeding supplement (PRO-STAT SUGAR FREE 64)  30 mL Oral BID  . furosemide  20 mg Intravenous Q6H  . metoprolol tartrate  5 mg Intravenous Q8H  . oxymetazoline  1 spray Each Nare BID  . senna-docusate  1 tablet Oral Daily  . sucralfate  1 g Oral TID AC & HS   Continuous Infusions: . sodium chloride 150 mL/hr at 01/25/19 0729     LOS: 13 days     Georgette Shell, MD [facility]  If 7PM-7AM, please contact night-coverage [email or web address] Password [email or web address] 01/25/2019, 2:50 PM

## 2019-01-25 NOTE — Progress Notes (Signed)
CRITICAL VALUE ALERT  Critical Value:  hgb 7.0 plts 20 Date & Time Notied:  12/23 0545  Provider Notified:yes  Orders Received/Actions taken: pending

## 2019-01-25 NOTE — Progress Notes (Signed)
Consulted with Dr Maudie Mercury on low platelets.  No new orders at this time.

## 2019-01-25 NOTE — Progress Notes (Signed)
Occupational Therapy Treatment Patient Details Name: Jesus [name] MRN: [id number] DOB: 1955-10-22 Today's Date: 01/25/2019    History of present illness Pt is 63 year old male with history of multiple myeloma, arthritis, cervical stenosis who presented with nosebleed.  Pt was found to have severe thrombocytopenia, normocytic anemia, and hypercalcemia secondary to multiple myeloma.  Today platelets are 17 (nurse report pt received platelets since this reading) and Calcium is 13   OT comments  Pt very fatigued.  Yellow theraband left for next OT session  Follow Up Recommendations  Home health OT;Supervision/Assistance - 24 hour    Equipment Recommendations  3 in 1 bedside commode    Recommendations for Other Services      Precautions / Restrictions Precautions Precautions: Fall;Other (comment) Precaution Comments: platelets remain low (20), receiving transfusion time of OT tx Restrictions Weight Bearing Restrictions: No       Mobility Bed Mobility Overal bed mobility: Needs Assistance Bed Mobility: Sit to Supine       Sit to supine: Supervision   General bed mobility comments: for safety  Transfers Overall transfer level: Needs assistance Equipment used: Rolling walker (2 wheeled) Transfers: Sit to/from Stand Sit to Stand: Min guard         General transfer comment: cues for hand placement, incr time, min/guard for safety, transition to RW     Balance   Sitting-balance support: Feet supported;No upper extremity supported Sitting balance-Leahy Scale: Fair     Standing balance support: No upper extremity supported;Bilateral upper extremity supported Standing balance-Leahy Scale: Fair Standing balance comment: able to maintain static stand without support briefly                           ADL either performed or assessed with clinical judgement   ADL Overall ADL's : Needs assistance/impaired     Grooming: Min guard;Wash/dry face;Bed  level;Wash/dry hands Grooming Details (indicate cue type and reason): HOB elevated. No jerking movements noted                               General ADL Comments: pt fatigued VERY quickly with simple grooming task.  Initiated BUE exercise but pt too tired. Yellow theraband left for next OT session     Vision Baseline Vision/History: No visual deficits            Cognition Arousal/Alertness: Awake/alert(sleepy d/t meds but arouses with verbal stimuli) Behavior During Therapy: WFL for tasks assessed/performed Overall Cognitive Status: Within Functional Limits for tasks assessed Area of Impairment: Following commands                       Following Commands: Follows one step commands with increased time       General Comments: Very low voice volume                   Pertinent Vitals/ Pain       Pain Assessment: No/denies pain         Frequency  Min 2X/week        Progress Toward Goals  OT Goals(current goals can now be found in the care plan section)     Acute Rehab OT Goals Patient Stated Goal: return home  Plan Discharge plan remains appropriate       AM-PAC OT "6 Clicks" Daily Activity     Outcome Measure   Help from another  person eating meals?: A Little Help from another person taking care of personal grooming?: A Little Help from another person toileting, which includes using toliet, bedpan, or urinal?: A Little Help from another person bathing (including washing, rinsing, drying)?: A Little Help from another person to put on and taking off regular upper body clothing?: A Little Help from another person to put on and taking off regular lower body clothing?: A Little 6 Click Score: 18    End of Session    OT Visit Diagnosis: Unsteadiness on feet (R26.81);Muscle weakness (generalized) (M62.81)   Activity Tolerance Patient limited by fatigue   Patient Left in bed;with bed alarm set   Nurse Communication Mobility status         Time: 0122-2411 OT Time Calculation (min): 9 min  Charges: OT General Charges $OT Visit: 1 Visit OT Treatments $Self Care/Home Management : 8-22 mins  Kari Baars, [place] [phone] Office- [phone], Edwena Felty D 01/25/2019, 4:39 PM

## 2019-01-25 NOTE — Progress Notes (Signed)
Nutrition Follow-up  DOCUMENTATION CODES:   Underweight  INTERVENTION:   -Boost Breeze po TID, each supplement provides 250 kcal and 9 grams of protein -Prostat liquid protein PO 30 ml BID with meals, each supplement provides 100 kcal, 15 grams protein.  NUTRITION DIAGNOSIS:   Increased nutrient needs related to cancer and cancer related treatments as evidenced by estimated needs.  Ongoing.  GOAL:   Patient will meet greater than or equal to 90% of their needs  Progressing.  MONITOR:   PO intake, Supplement acceptance, Labs, Weight trends, I & O's, GOC  ASSESSMENT:   63 year old male with history of multiple myeloma, arthritis, cervical stenosis who presented with nosebleed.  He had a similar episode about 2 weeks ago which lasted for 8 hours but spontaneously resolved.  He follows with oncologist at [facility] to have severe thrombocytopenia and presentation and also has normocytic anemia. Admitted for hypercalcemia.  Patient currently consuming 10-35% of meals. Pt eating very minimally. Pt is drinking Boost Breeze supplements and taking Prostat, will continue these for kcals and protein.   Per Palliative care note 12/22, GOC still need to be clarified given pt's decline. Admission weight: 108 lbs. Current weight: 120 lbs.  I/Os: -20.8L since admit UOP: 6.6L x 24 hrs  Medications: Marinol capsule, Carafate tablet Labs reviewed: Mg/Phos/K WNL Corrected Ca+ 12.8  Diet Order:   Diet Order            DIET DYS 3 Room service appropriate? Yes with Assist; Fluid consistency: Thin  Diet effective now              EDUCATION NEEDS:   Not appropriate for education at this time  Skin:  Skin Assessment: Reviewed RN Assessment  Last BM:  12/21 -type 6  Height:   Ht Readings from Last 1 Encounters:  01/21/19 5' 7.01" (1.702 m)    Weight:   Wt Readings from Last 1 Encounters:  01/25/19 54.5 kg    Ideal Body Weight:  67.2 kg  BMI:  Body mass index  is 18.81 kg/m.  Estimated Nutritional Needs:   Kcal:  1550-1750  Protein:  75-85g  Fluid:  1.8L/day   Clayton Bibles, MS, RD, LDN Inpatient Clinical Dietitian Pager: [phone] After Hours Pager: [phone]

## 2019-01-25 NOTE — Progress Notes (Signed)
Physical Therapy Treatment Patient Details Name: Jesus [name] MRN: [id number] DOB: [birth year] Today's Date: 01/25/2019    History of Present Illness Pt is 63 year old male with history of multiple myeloma, arthritis, cervical stenosis who presented with nosebleed.  Pt was found to have severe thrombocytopenia, normocytic anemia, and hypercalcemia secondary to multiple myeloma.  Today platelets are 17 (nurse report pt received platelets since this reading) and Calcium is 13    PT Comments    Pt sleepy d/t meds (benadryl prior to transfusion) but aroused fairly easily. amb distance in hallway and tolerated well. Continued to recommend HHPT at dc, continue to follow in acute setting   Follow Up Recommendations  Home health PT;Supervision for mobility/OOB     Equipment Recommendations  Rolling walker with 5" wheels    Recommendations for Other Services       Precautions / Restrictions Precautions Precautions: Fall;Other (comment) Precaution Comments: platelets remain low (20), receiving transfusion time of PT tx Restrictions Weight Bearing Restrictions: No    Mobility  Bed Mobility Overal bed mobility: Needs Assistance Bed Mobility: Sit to Supine       Sit to supine: Supervision   General bed mobility comments: for safety  Transfers Overall transfer level: Needs assistance Equipment used: Rolling walker (2 wheeled) Transfers: Sit to/from Stand Sit to Stand: Min guard         General transfer comment: cues for hand placement, incr time, min/guard for safety, transition to RW   Ambulation/Gait Ambulation/Gait assistance: Min guard Gait Distance (Feet): 160 Feet Assistive device: Rolling walker (2 wheeled) Gait Pattern/deviations: Step-through pattern;Narrow base of support;Trunk flexed Gait velocity: decreased   General Gait Details: cues for posture, RW safety, mildly unsteady however no overt LOB   Stairs             Wheelchair Mobility     Modified Rankin (Stroke Patients Only)       Balance   Sitting-balance support: Feet supported;No upper extremity supported Sitting balance-Leahy Scale: Fair     Standing balance support: No upper extremity supported;Bilateral upper extremity supported Standing balance-Leahy Scale: Fair Standing balance comment: able to maintain static stand without support briefly                            Cognition Arousal/Alertness: Awake/alert(sleepy d/t meds but arouses with verbal stimuli) Behavior During Therapy: WFL for tasks assessed/performed Overall Cognitive Status: Within Functional Limits for tasks assessed                         Following Commands: Follows one step commands with increased time       General Comments: Very low voice volume      Exercises      General Comments        Pertinent Vitals/Pain      Home Living                      Prior Function            PT Goals (current goals can now be found in the care plan section) Acute Rehab PT Goals Patient Stated Goal: return home PT Goal Formulation: With family Time For Goal Achievement: 02/03/19 Potential to Achieve Goals: Good Progress towards PT goals: Progressing toward goals    Frequency    Min 3X/week      PT Plan Current plan remains appropriate    Co-evaluation  AM-PAC PT "6 Clicks" Mobility   Outcome Measure  Help needed turning from your back to your side while in a flat bed without using bedrails?: None Help needed moving from lying on your back to sitting on the side of a flat bed without using bedrails?: None Help needed moving to and from a bed to a chair (including a wheelchair)?: A Little Help needed standing up from a chair using your arms (e.g., wheelchair or bedside chair)?: A Little Help needed to walk in hospital room?: A Little Help needed climbing 3-5 steps with a railing? : A Little 6 Click Score: 20    End of  Session Equipment Utilized During Treatment: Gait belt Activity Tolerance: Patient tolerated treatment well Patient left: in bed;with call bell/phone within reach;with bed alarm set;with family/visitor present   PT Visit Diagnosis: Unsteadiness on feet (R26.81);Other abnormalities of gait and mobility (R26.89)     Time: 2876-8115 PT Time Calculation (min) (ACUTE ONLY): 14 min  Charges:  $Gait Training: 8-22 mins                     Baxter Flattery, PT   Acute Rehab Dept [facility]): [phone]   01/25/2019    [facility] 01/25/2019, 3:32 PM

## 2019-01-25 NOTE — Progress Notes (Signed)
Lab called stating that patient has unit of blood, but is the least compatible, but still compatible.  Spoke with Dr. Alen Blew over the phone about blood.  He said that it was ok to proceed with the blood, and recommended ordering tylenol and benadryl as pre-medications.

## 2019-01-25 NOTE — Progress Notes (Signed)
[facility] ROUNDING NOTE   Subjective:   63 year old gentleman multiple myeloma arthritis cervical stenosis presented 01/11/2019 with nosebleed.  Followed by oncology at [facility] has severe thrombocytopenia and anemia.  Hypercalcemia was also noted and nephrology was consulted.  Received IV dexamethasone, calcitonin and Zometa and IV fluids.  Blood pressure 134/79 pulse 93 temperature 98.4 O2 sats 100% room air  Sodium 138 potassium 4 chloride 107 CO2 26 BUN 12 creatinine 0.64 calcium 11.4 glucose 93 phosphorus 3.6 magnesium 1.9 albumin 2.2 WBC 1.5 hemoglobin 7.0 platelets 20  Urine output 4.8 L 01/24/2019  Calcitonin subcu 4 units/kg x 4 doses 01/14/2019 01/15/2019 01/16/2019. Decadron 12 mg IV 01/18/2019 Decadron 8 mg 01/21/2019 Decadron 4 mg 01/22/2019 until 01/25/2019 Zometa 4 mg IV 01/21/2019   Objective:  Vital signs in last 24 hours:  Temp:  [98.1 F (36.7 C)-98.4 F (36.9 C)] 98.4 F (36.9 C) (12/23 0538) Pulse Rate:  [92-106] 92 (12/23 0538) Resp:  [16-17] 17 (12/23 0538) BP: (134-146)/(73-79) 134/79 (12/23 0538) SpO2:  [100 %] 100 % (12/23 0538) Weight:  [54.5 kg] 54.5 kg (12/23 0853)  Weight change:  Filed Weights   01/22/19 0532 01/23/19 0500 01/25/19 0853  Weight: 52.8 kg 54.6 kg 54.5 kg    Intake/Output: I/O last 3 completed shifts: In: 5799.2 [P.O.:1610; I.V.:3489.3; IV Piggyback:699.9] Out: 9600 [Urine:9600]   Intake/Output this shift:  Total I/O In: -  Out: 1600 [Urine:1600]  YYQ:MGNOIBBCW somewhat confused CVS: no rub Resp: cta Abd: +BS, soft, NT/Nd Ext: no edema   Basic Metabolic Panel: Recent Labs  Lab 01/19/19 0249 01/21/19 0439 01/22/19 0412 01/23/19 0348 01/24/19 0358 01/24/19 0400 01/25/19 0438  NA 136 145 137 138 139 138 138  K 3.7 3.3* 4.1 3.9 2.9* 2.9* 4.0  CL 106 111 101 106 106 105 107  CO2 _0 GLUCOSE 163* 97 140* 132* 98 102* 93  BUN _1 CREATININE 0.74 0.87 0.73  0.75 0.68 0.66 0.64  CALCIUM 11.1* 13.0* 12.6* 11.0* 11.0* 10.8* 11.4*  MG 1.4* 1.6* 2.2 1.9 1.9  --  1.9  PHOS 2.3*  --  2.9 2.2*  --  2.7 3.6    Liver Function Tests: Recent Labs  Lab 01/22/19 0412 01/23/19 0348 01/24/19 0400 01/25/19 0438  ALBUMIN 2.5* 2.7* 2.6* 2.2*   No results for input(s): LIPASE, AMYLASE in the last 168 hours. No results for input(s): AMMONIA in the last 168 hours.  CBC: Recent Labs  Lab 01/21/19 0439 01/22/19 1541 01/23/19 0348 01/24/19 0358 01/25/19 0438  WBC 1.5* 2.4* 2.6* 2.3* 1.5*  HGB 7.4* 7.5* 7.6* 7.6* 7.0*  HCT 22.8* 23.2* 23.1* 23.5* 21.6*  MCV 89.4 87.9 87.8 88.3 88.9  PLT 17* 51* 45* 31* 20*    Cardiac Enzymes: No results for input(s): CKTOTAL, CKMB, CKMBINDEX, TROPONINI in the last 168 hours.  BNP: Invalid input(s): POCBNP  CBG: No results for input(s): GLUCAP in the last 168 hours.  Microbiology: Results for orders placed or performed during the hospital encounter of 01/11/19  SARS CORONAVIRUS 2 (TAT 6-24 HRS) Nasopharyngeal Nasopharyngeal Swab     Status: None   Collection Time: 01/11/19  3:32 PM   Specimen: Nasopharyngeal Swab  Result Value Ref Range Status   SARS Coronavirus 2 NEGATIVE NEGATIVE Final    Comment: (NOTE) SARS-CoV-2 target nucleic acids are NOT DETECTED. The SARS-CoV-2 RNA is generally detectable in upper and lower respiratory specimens during the acute phase of  infection. Negative results do not preclude SARS-CoV-2 infection, do not rule out co-infections with other pathogens, and should not be used as the sole basis for treatment or other patient management decisions. Negative results must be combined with clinical observations, patient history, and epidemiological information. The expected result is Negative. Fact Sheet for Patients: SugarRoll.be Fact Sheet for Healthcare Providers: https://www.woods-mathews.com/ This test is not yet approved or cleared by  the [place] FDA and  has been authorized for detection and/or diagnosis of SARS-CoV-2 by FDA under an Emergency Use Authorization (EUA). This EUA will remain  in effect (meaning this test can be used) for the duration of the COVID-19 declaration under Section 56 4(b)(1) of the Act, 21 U.S.C. section 360bbb-3(b)(1), unless the authorization is terminated or revoked sooner. Performed at [facility], [place] [address], [place], [place] [address]     Coagulation Studies: No results for input(s): LABPROT, INR in the last 72 hours.  Urinalysis: Recent Labs    01/24/19 [place] 1.006  PHURINE 7.0  GLUCOSEU NEGATIVE  HGBUR NEGATIVE  BILIRUBINUR NEGATIVE  KETONESUR NEGATIVE  PROTEINUR NEGATIVE  NITRITE NEGATIVE  LEUKOCYTESUR SMALL*      Imaging: CT HEAD WO CONTRAST  Result Date: 01/24/2019 CLINICAL DATA:  Delirium EXAM: CT HEAD WITHOUT CONTRAST TECHNIQUE: Contiguous axial images were obtained from the base of the skull through the vertex without intravenous contrast. COMPARISON:  None. FINDINGS: Brain: No evidence of acute infarction, hemorrhage, hydrocephalus, extra-axial collection or mass lesion/mass effect. Vascular: Negative for hyperdense vessel Skull: Negative Sinuses/Orbits: Paranasal sinuses clear. Negative orbit bilaterally. Bilateral cataract surgery. Other: None IMPRESSION: Negative CT head.  No acute abnormality Electronically Signed   By: Franchot Gallo M.D.   On: 01/24/2019 14:38   EEG adult  Result Date: 01/24/2019 Lora Havens, MD     01/24/2019  6:58 PM Patient Name: COLSON BARCO MRN: [id number] Epilepsy Attending: Lora Havens Referring Physician/Provider: Dr Jacki Cones Date: 01/24/2019 Duration: 22.09 mins Patient history: 63yo M with ams and jerking. EEG to evaluate for seizure Level of alertness: awake AEDs during EEG study: Lorazepam Technical aspects: This EEG study was done with scalp electrodes positioned  according to the 10-20 International system of electrode placement. Electrical activity was acquired at a sampling rate of 500Hz and reviewed with a high frequency filter of 70Hz and a low frequency filter of 1Hz. EEG data were recorded continuously and digitally stored. DESCRIPTION: No clear posterior dominant rhythm was seen. EEG showed continuous generalized 3-5hz theta-delta slowing. Hyperventilation and photic stimulation were not performed. ABNORMALITY - Continuous slow, generalized IMPRESSION: This study is suggestive of moderate diffuse encephalopathy, non specific to etiology. No seizures or epileptiform discharges were seen throughout the recording. Priyanka Barbra Sarks     Medications:   . sodium chloride 150 mL/hr at 01/25/19 0729   . sodium chloride   Intravenous Once  . acetaminophen  650 mg Oral Once  . bisacodyl  10 mg Oral Daily  . Chlorhexidine Gluconate Cloth  6 each Topical Daily  . diphenhydrAMINE  25 mg Oral Once  . docusate sodium  100 mg Oral BID  . dronabinol  10 mg Oral BID  . feeding supplement  1 Container Oral TID BM  . feeding supplement (PRO-STAT SUGAR FREE 64)  30 mL Oral BID  . metoprolol tartrate  5 mg Intravenous Q8H  . oxymetazoline  1 spray Each Nare BID  . senna-docusate  1 tablet Oral Daily  .  sucralfate  1 g Oral TID AC & HS   acetaminophen, bisacodyl, docusate, LORazepam, methocarbamol, ondansetron, oxyCODONE, senna-docusate, sodium chloride flush, witch hazel-glycerin  Assessment/ Plan:   Hypercalcemia secondary to multiple myeloma.  Appears to have responded to second dose of Zometa IV.  Continues on Decadron.  Palliative medicine to help overall prognosis is poor.  Unfortunately calcium appears to be increasing again.  We will add Lasix in order to try to promote callus uresis.  Hypernatremia has improved.   Calciuresis.  Is receiving 150 cc an hour.  I would continue.  Patient has great urine output    Hypokalemia will replete  Myotonic jerks  improved  Altered mental status.  Gabapentin discontinued  Acute kidney injury resolved   LOS: [place] _0 _1 :23 PM

## 2019-01-25 NOTE — TOC Initial Note (Signed)
Transition of Care [facility]) - Initial/Assessment Note    Patient Details  Name: Jesus [name] MRN: [id number] Date of Birth: 10-10-1955  Transition of Care [place]) CM/SW Contact:    Trish Mage, LCSW Phone Number: 01/25/2019, 5:03 PM  Clinical Narrative:    Patient referred to [facility] for [facility] services.               Expected Discharge Plan: [place] Barriers to Discharge: No Barriers Identified   Patient Goals and CMS Choice        Expected Discharge Plan and Services Expected Discharge Plan: [place]                                              Prior Living Arrangements/Services                       Activities of Daily Living Home Assistive Devices/Equipment: None ADL Screening (condition at time of admission) Patient's cognitive ability adequate to safely complete daily activities?: Yes Is the patient deaf or have difficulty hearing?: No Does the patient have difficulty seeing, even when wearing glasses/contacts?: No Does the patient have difficulty concentrating, remembering, or making decisions?: No Patient able to express need for assistance with ADLs?: Yes Does the patient have difficulty dressing or bathing?: No Independently performs ADLs?: Yes (appropriate for developmental age) Does the patient have difficulty walking or climbing stairs?: No Weakness of Legs: Both Weakness of Arms/Hands: Both  Permission Sought/Granted                  Emotional Assessment              Admission diagnosis:  Hypercalcemia [E83.52] Epistaxis [R04.0] Thrombocytopenia (HCC) [D69.6] Multiple myeloma not having achieved remission ([place]) [C90.00] Anemia, unspecified type [D64.9] Leukopenia, unspecified type [D72.819] Patient Active Problem List   Diagnosis Date Noted  . Anemia   . Leukopenia   . Epistaxis   . Thrombocytopenia ([place])   . Hypercalcemia 01/11/2019  . Multiple myeloma ([place]) 11/04/2018  .  Port-A-Cath in place 07/22/2018  . Mass of left thigh 06/23/2018  . Bone lesion 06/20/2018  . History of colon polyps 06/20/2018  . Nuclear cataract 06/20/2018  . Counseling regarding advance care planning and goals of care 04/20/2018  . 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 Myelopathy of thoracic region 09/01/2016  . S/P spinal fusion 08/26/2016  . 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 Multiple myeloma not having achieved remission ([place]) 08/17/2016  . Multiple myeloma in remission ([place]) 08/17/2016  . 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 Family history of prostate cancer 02/16/2012  . Hypercholesteremia 02/16/2012  . Benign prostatic hyperplasia with urinary obstruction 03/28/2009  . Encounter for screening for cardiovascular disorders 06/10/2008  . Erectile dysfunction 02/10/2006   PCP:  Patient, No Pcp Per Pharmacy:   [facility], [address] [place] [address] Phone: [phone] Fax: [phone]  [facility] ([place], [place] [address] [place] [address] Phone: [phone] Fax: [phone]     Social Determinants of Health (SDOH) Interventions    Readmission Risk Interventions Readmission Risk Prevention Plan 11/07/2018  Transportation Screening Complete  PCP or Specialist Appt  within 5-7 Days Complete  Home Care Screening Complete  Medication Review (RN CM) Complete

## 2019-01-26 ENCOUNTER — Other Ambulatory Visit: Payer: Self-pay

## 2019-01-26 ENCOUNTER — Encounter: Payer: Self-pay | Admitting: Pharmacy Technician

## 2019-01-26 ENCOUNTER — Ambulatory Visit: Payer: Self-pay

## 2019-01-26 LAB — RENAL FUNCTION PANEL
Albumin: 2.9 g/dL — ABNORMAL LOW (ref 3.5–5.0)
Anion gap: 8 (ref 5–15)
BUN: 13 mg/dL (ref 8–23)
CO2: 27 mmol/L (ref 22–32)
Calcium: 12.1 mg/dL — ABNORMAL HIGH (ref 8.9–10.3)
Chloride: 99 mmol/L (ref 98–111)
Creatinine, Ser: 0.73 mg/dL (ref 0.61–1.24)
GFR calc Af Amer: >60 mL/min (ref >60)
GFR calc non Af Amer: >60 mL/min (ref >60)
Glucose, Bld: 111 mg/dL — ABNORMAL HIGH (ref 70–99)
Phosphorus: 3.8 mg/dL (ref 2.5–4.6)
Potassium: 2.8 mmol/L — ABNORMAL LOW (ref 3.5–5.1)
Sodium: 134 mmol/L — ABNORMAL LOW (ref 135–145)

## 2019-01-26 LAB — CBC
HCT: 33.8 % — ABNORMAL LOW (ref 39.0–52.0)
Hemoglobin: 11.5 g/dL — ABNORMAL LOW (ref 13.0–17.0)
MCH: 29.7 pg (ref 26.0–34.0)
MCHC: 34 g/dL (ref 30.0–36.0)
MCV: 87.3 fL (ref 80.0–100.0)
Platelets: 18 10*3/uL — CL (ref 150–400)
RBC: 3.87 MIL/uL — ABNORMAL LOW (ref 4.22–5.81)
RDW: 17.6 % — ABNORMAL HIGH (ref 11.5–15.5)
WBC: 1.9 10*3/uL — ABNORMAL LOW (ref 4.0–10.5)
nRBC: 0 % (ref 0.0–0.2)

## 2019-01-26 LAB — MAGNESIUM: Magnesium: 1.7 mg/dL (ref 1.7–2.4)

## 2019-01-26 MED ORDER — POTASSIUM CHLORIDE 20 MEQ PO PACK
40.0000 meq | PACK | Freq: Once | ORAL | Status: AC
  Administered 2019-01-26: 40 meq via ORAL
  Filled 2019-01-26: qty 2

## 2019-01-26 MED ORDER — ZOLEDRONIC ACID 4 MG/5ML IV CONC
4.0000 mg | Freq: Once | INTRAVENOUS | Status: AC
  Administered 2019-01-28: 4 mg via INTRAVENOUS
  Filled 2019-01-26: qty 5

## 2019-01-26 MED ORDER — POTASSIUM CHLORIDE 10 MEQ/100ML IV SOLN
10.0000 meq | INTRAVENOUS | Status: AC
  Administered 2019-01-26: 10 meq via INTRAVENOUS
  Filled 2019-01-26: qty 100

## 2019-01-26 MED ORDER — POTASSIUM CHLORIDE 10 MEQ/100ML IV SOLN
10.0000 meq | INTRAVENOUS | Status: AC
  Administered 2019-01-26 (×3): 10 meq via INTRAVENOUS
  Filled 2019-01-26: qty 100

## 2019-01-26 MED ORDER — SODIUM CHLORIDE 0.9% IV SOLUTION: Freq: Once | INTRAVENOUS | Status: AC

## 2019-01-26 MED ORDER — POTASSIUM CHLORIDE 10 MEQ/100ML IV SOLN
10.0000 meq | INTRAVENOUS | Status: AC
  Administered 2019-01-26 (×5): 10 meq via INTRAVENOUS
  Filled 2019-01-26: qty 100

## 2019-01-26 MED ORDER — CALCITONIN (SALMON) 200 UNIT/ML IJ SOLN
200.0000 [IU] | Freq: Two times a day (BID) | INTRAMUSCULAR | Status: AC
  Administered 2019-01-26 – 2019-01-28 (×3): 200 [IU] via SUBCUTANEOUS
  Filled 2019-01-26 (×5): qty 1

## 2019-01-26 NOTE — Progress Notes (Signed)
PROGRESS NOTE    Jesus [name]  FUX:[id number] DOB: November 12, 1955 DOA: 01/11/2019 PCP: Patient, No Pcp Per   Brief Narrative: Patient is a 63 year old male with history of multiple myeloma, arthritis, cervical stenosis who presented with nosebleed. He had a similar episode about 2 weeks ago which lasted for 8 hours but spontaneously resolved. He follows with oncologist at [place] to have severe thrombocytopenia at presentation and also has normocytic anemia. Hematology/oncology consulted. Nosebleed has stopped.  Also found to have hypercalcemia secondary to [facility] given   Zometa and Decadron,IV fluids.  Prolonged hospital course due to persistent hypercalcemia, thrombocytopenia.  Nephrology also following.  Assessment & Plan:   Active Problems:   Hypercalcemia   Epistaxis   Thrombocytopenia (HCC)   Anemia   Leukopenia   Hypercalcemia:secondary to progressive multiple myeloma-s/p several doses of zometa,decadron and calcitoninand IV fluids Being given another dose of Zometa and calcitonin today.  Nephrology following.  Pancytopenia:Severe thrombocytopenia, has leukopenia, anemia.  Platelets only 18 today.  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 Recent C3 compression fracture status post corpectomy C2-C4 plating. Hematology/oncology following.  Further management as per oncology.  Myoclonic jerking:almost resolved secondary to gabapentin which has been stopped.   AKI :resolved creatinine   Severe  hypokalemia : Supplemented.Will monitor levels   Epistaxis : resolved  Constipation :expected with hypercalcemia.  continue Colace and Dulcolax   Severe protein calorie malnutrition: dietary consulted  Goals of care :patient remains  a full code ,he has progressive multiple myeloma with poor prognosis  Acute delirium likely multifactorial. Improved after stopping Decadron.  EEG showed generalized encephalopathy.  No evidence of epileptiform activities.  CT head shows no acute changes.   Nutrition Problem: Increased nutrient needs Etiology: cancer and cancer related treatments      DVT prophylaxis:SCD Code Status: Full Family Communication: None present at the bedside Disposition Plan: Home when clinically stable.   Consultants: Oncology, nephrology  Procedures: Bone marrow biopsy  Antimicrobials:  Anti-infectives (From admission, onward)   None      Subjective: Patient seen and examined at bedside this morning.  He appears comfortable today.  Hemodynamically stable.  Alert and awake and denies any specific complaints.  Objective: Vitals:   01/26/19 0649 01/26/19 0733 01/26/19 0816 01/26/19 1128  BP: 124/77 137/85  127/79  Pulse: 89 94  92  Resp: '18 16  16  '$ Temp: 98.8 F (37.1 C) 98.4 F (36.9 C)  98.1 F (36.7 C)  TempSrc: Oral   Oral  SpO2: 100% 100%  99%  Weight:   50.6 kg   Height:        Intake/Output Summary (Last 24 hours) at 01/26/2019 1401 Last data filed at 01/26/2019 1300 Gross per 24 hour  Intake 5358.65 ml  Output 7003 ml  Net -1644.35 ml   Filed Weights   01/23/19 0500 01/25/19 0853 01/26/19 0816  Weight: 54.6 kg 54.5 kg 50.6 kg    Examination:  General exam: Appears calm and comfortable ,Not in distress,thin built HEENT:PERRL,Oral mucosa moist, Ear/Nose normal on gross exam Respiratory system: Bilateral equal air entry, normal vesicular breath sounds, no wheezes or crackles  Cardiovascular system: S1 & S2 heard, RRR. No JVD, murmurs, rubs, gallops or clicks. No pedal edema.  Chemo-Port on the right chest Gastrointestinal system: Abdomen is nondistended,  soft and nontender. No organomegaly or masses felt. Normal bowel sounds heard. Central nervous system: Alert and  oriented. No focal neurological deficits. Extremities: No edema, no clubbing ,no cyanosis, distal peripheral pulses palpable. Skin: No rashes, lesions or ulcers,no icterus ,no pallor    Data Reviewed: I have personally reviewed following labs and imaging studies  CBC: Recent Labs  Lab 01/22/19 1541 01/23/19 0348 05-Feb-2019 0358 01/25/19 0438 01/26/19 0433  WBC 2.4* 2.6* 2.3* 1.5* 1.9*  HGB 7.5* 7.6* 7.6* 7.0* 11.5*  HCT 23.2* 23.1* 23.5* 21.6* 33.8*  MCV 87.9 87.8 88.3 88.9 87.3  PLT 51* 45* 31* 20* 18*   Basic Metabolic Panel: Recent Labs  Lab 01/22/19 0412 01/23/19 0348 02/05/19 0358 2019/02/05 0400 01/25/19 0438 01/26/19 0433  NA 137 138 139 138 138 134*  K 4.1 3.9 2.9* 2.9* 4.0 2.8*  CL 101 106 106 105 107 99  CO2 _0 GLUCOSE 140* 132* 98 102* 93 111*  BUN _1 CREATININE 0.73 0.75 0.68 0.66 0.64 0.73  CALCIUM 12.6* 11.0* 11.0* 10.8* 11.4* 12.1*  MG 2.2 1.9 1.9  --  1.9 1.7  PHOS 2.9 2.2*  --  2.7 3.6 3.8   GFR: Estimated Creatinine Clearance: 67.6 mL/min (by C-G formula based on SCr of 0.73 mg/dL). Liver Function Tests: Recent Labs  Lab 01/22/19 0412 01/23/19 0348 2019/02/05 0400 01/25/19 0438 01/26/19 0433  ALBUMIN 2.5* 2.7* 2.6* 2.2* 2.9*   No results for input(s): LIPASE, AMYLASE in the last 168 hours. No results for input(s): AMMONIA in the last 168 hours. Coagulation Profile: No results for input(s): INR, PROTIME in the last 168 hours. Cardiac Enzymes: No results for input(s): CKTOTAL, CKMB, CKMBINDEX, TROPONINI in the last 168 hours. BNP (last 3 results) No results for input(s): PROBNP in the last 8760 hours. HbA1C: No results for input(s): HGBA1C in the last 72 hours. CBG: No results for input(s): GLUCAP in the last 168 hours. Lipid Profile: No results for input(s): CHOL, HDL, LDLCALC, TRIG, CHOLHDL, LDLDIRECT in the last 72 hours. Thyroid Function Tests: No results for input(s): TSH, T4TOTAL, FREET4, T3FREE,  THYROIDAB in the last 72 hours. Anemia Panel: No results for input(s): VITAMINB12, FOLATE, FERRITIN, TIBC, IRON, RETICCTPCT in the last 72 hours. Sepsis Labs: No results for input(s): PROCALCITON, LATICACIDVEN in the last 168 hours.  No results found for this or any previous visit (from the past 240 hour(s)).       Radiology Studies: EEG adult  Result Date: 02/05/19 Lora Havens, MD     2019-02-05  6:58 PM Patient Name: JIHAD BROWNLOW MRN: [id number] Epilepsy Attending: Lora Havens Referring Physician/Provider: Dr Jacki Cones Date: 02-05-2019 Duration: 22.09 mins Patient history: 63yo M with ams and jerking. EEG to evaluate for seizure Level of alertness: awake AEDs during EEG study: Lorazepam Technical aspects: This EEG study was done with scalp electrodes positioned according to the 10-20 International system of electrode placement. Electrical activity was acquired at a sampling rate of 500Hz and reviewed with a high frequency filter of 70Hz and a low frequency filter of 1Hz. EEG data were recorded continuously and digitally stored. DESCRIPTION: No clear posterior dominant rhythm was seen. EEG showed continuous generalized 3-5hz theta-delta slowing. Hyperventilation and photic stimulation were not performed. ABNORMALITY - Continuous slow, generalized IMPRESSION: This study is suggestive of moderate diffuse encephalopathy, non specific to etiology. No seizures or epileptiform discharges were seen throughout the recording. Priyanka Barbra Sarks  Scheduled Meds: . bisacodyl  10 mg Oral Daily  . calcitonin  200 Units Subcutaneous BID  . Chlorhexidine Gluconate Cloth  6 each Topical Daily  . docusate sodium  100 mg Oral BID  . dronabinol  10 mg Oral BID  . feeding supplement  1 Container Oral TID BM  . feeding supplement (PRO-STAT SUGAR FREE 64)  30 mL Oral BID  . furosemide  20 mg Intravenous Q6H  . metoprolol tartrate  5 mg Intravenous Q8H  . oxymetazoline  1  spray Each Nare BID  . senna-docusate  1 tablet Oral Daily  . sucralfate  1 g Oral TID AC & HS   Continuous Infusions: . sodium chloride 150 mL/hr at 01/25/19 2116  . potassium chloride 10 mEq (01/26/19 1231)  . potassium chloride    . [START ON 01/28/2019] zoledronic acid (ZOMETA) IV       LOS: 14 days    Time spent: 35 mins.More than 50% of that time was spent in counseling and/or coordination of care.      Shelly Coss, MD [facility] Pager [phone]  If 7PM-7AM, please contact night-coverage [email or web address] Password [email or web address] 01/26/2019, 2:01 PM

## 2019-01-26 NOTE — Progress Notes (Addendum)
[facility] ROUNDING NOTE   Subjective:   63 year old gentleman multiple myeloma arthritis cervical stenosis presented 01/11/2019 with nosebleed.  Followed by oncology at [facility] has severe thrombocytopenia and anemia.  Hypercalcemia was also noted and nephrology was consulted.  Received IV dexamethasone, calcitonin and Zometa and IV fluids.  Pressure 127/79 pulse 92 temperature 98.1 O2 sats 99% room air  Sodium 134 potassium 2.8 chloride 99 CO2 27 BUN 13 creatinine 0.73 glucose 111 calcium 12.1 phosphorus 3.8 magnesium 1.7 albumin 2.9 WBC 1.9 hemoglobin 11.5 platelets 18  Calcitonin subcu 4 units/kg x 4 doses 01/14/2019 01/15/2019 01/16/2019. Decadron 12 mg IV 01/18/2019 Decadron 8 mg 01/21/2019 Decadron 4 mg 01/22/2019 until 01/25/2019 Zometa 4 mg IV 01/21/2019   Objective:  Vital signs in last 24 hours:  Temp:  [97.6 F (36.4 C)-98.8 F (37.1 C)] 98.1 F (36.7 C) (12/24 1128) Pulse Rate:  [89-109] 92 (12/24 1128) Resp:  [16-24] 16 (12/24 1128) BP: (105-137)/(57-91) 127/79 (12/24 1128) SpO2:  [99 %-100 %] 99 % (12/24 1128) Weight:  [50.6 kg] 50.6 kg (12/24 0816)  Weight change:  Filed Weights   01/23/19 0500 01/25/19 0853 01/26/19 0816  Weight: 54.6 kg 54.5 kg 50.6 kg    Intake/Output: I/O last 3 completed shifts: In: 4886.8 [P.O.:1080; I.V.:2843.8; Blood:963] Out: 12703 [Urine:12700; Stool:3]   Intake/Output this shift:  Total I/O In: 590 [P.O.:590] Out: -   PQA:ESLPNPYYF somewhat confused CVS: no rub Resp: cta Abd: +BS, soft, NT/Nd Ext: no edema   Basic Metabolic Panel: Recent Labs  Lab 01/22/19 0412 01/23/19 0348 01/24/19 0358 01/24/19 0400 01/25/19 0438 01/26/19 0433  NA 137 138 139 138 138 134*  K 4.1 3.9 2.9* 2.9* 4.0 2.8*  CL 101 106 106 105 107 99  CO2 '29 26 27 26 26 27  ' GLUCOSE 140* 132* 98 102* 93 111*  BUN '15 19 14 15 12 13  ' CREATININE 0.73 0.75 0.68 0.66 0.64 0.73  CALCIUM 12.6* 11.0* 11.0* 10.8* 11.4* 12.1*  MG 2.2  1.9 1.9  --  1.9 1.7  PHOS 2.9 2.2*  --  2.7 3.6 3.8    Liver Function Tests: Recent Labs  Lab 01/22/19 0412 01/23/19 0348 01/24/19 0400 01/25/19 0438 01/26/19 0433  ALBUMIN 2.5* 2.7* 2.6* 2.2* 2.9*   No results for input(s): LIPASE, AMYLASE in the last 168 hours. No results for input(s): AMMONIA in the last 168 hours.  CBC: Recent Labs  Lab 01/22/19 1541 01/23/19 0348 01/24/19 0358 01/25/19 0438 01/26/19 0433  WBC 2.4* 2.6* 2.3* 1.5* 1.9*  HGB 7.5* 7.6* 7.6* 7.0* 11.5*  HCT 23.2* 23.1* 23.5* 21.6* 33.8*  MCV 87.9 87.8 88.3 88.9 87.3  PLT 51* 45* 31* 20* 18*    Cardiac Enzymes: No results for input(s): CKTOTAL, CKMB, CKMBINDEX, TROPONINI in the last 168 hours.  BNP: Invalid input(s): POCBNP  CBG: No results for input(s): GLUCAP in the last 168 hours.  Microbiology: Results for orders placed or performed during the hospital encounter of 01/11/19  SARS CORONAVIRUS 2 (TAT 6-24 HRS) Nasopharyngeal Nasopharyngeal Swab     Status: None   Collection Time: 01/11/19  3:32 PM   Specimen: Nasopharyngeal Swab  Result Value Ref Range Status   SARS Coronavirus 2 NEGATIVE NEGATIVE Final    Comment: (NOTE) SARS-CoV-2 target nucleic acids are NOT DETECTED. The SARS-CoV-2 RNA is generally detectable in upper and lower respiratory specimens during the acute phase of infection. Negative results do not preclude SARS-CoV-2 infection, do not rule out co-infections with other pathogens, and  should not be used as the sole basis for treatment or other patient management decisions. Negative results must be combined with clinical observations, patient history, and epidemiological information. The expected result is Negative. Fact Sheet for Patients: SugarRoll.be Fact Sheet for Healthcare Providers: https://www.woods-mathews.com/ This test is not yet approved or cleared by the [place] FDA and  has been authorized for detection and/or  diagnosis of SARS-CoV-2 by FDA under an Emergency Use Authorization (EUA). This EUA will remain  in effect (meaning this test can be used) for the duration of the COVID-19 declaration under Section 56 4(b)(1) of the Act, 21 U.S.C. section 360bbb-3(b)(1), unless the authorization is terminated or revoked sooner. Performed at [facility], [place] [address] [place], [place] [address]     Coagulation Studies: No results for input(s): LABPROT, INR in the last 72 hours.  Urinalysis: Recent Labs    01/24/19 [facility] 1.006  PHURINE 7.0  GLUCOSEU NEGATIVE  HGBUR NEGATIVE  BILIRUBINUR NEGATIVE  KETONESUR NEGATIVE  PROTEINUR NEGATIVE  NITRITE NEGATIVE  LEUKOCYTESUR SMALL*      Imaging: CT HEAD WO CONTRAST  Result Date: 01/24/2019 CLINICAL DATA:  Delirium EXAM: CT HEAD WITHOUT CONTRAST TECHNIQUE: Contiguous axial images were obtained from the base of the skull through the vertex without intravenous contrast. COMPARISON:  None. FINDINGS: Brain: No evidence of acute infarction, hemorrhage, hydrocephalus, extra-axial collection or mass lesion/mass effect. Vascular: Negative for hyperdense vessel Skull: Negative Sinuses/Orbits: Paranasal sinuses clear. Negative orbit bilaterally. Bilateral cataract surgery. Other: None IMPRESSION: Negative CT head.  No acute abnormality Electronically Signed   By: Franchot Gallo M.D.   On: 01/24/2019 14:38   EEG adult  Result Date: 01/24/2019 Lora Havens, MD     01/24/2019  6:58 PM Patient Name: Jesus [name] MRN: [id number] Epilepsy Attending: Lora Havens Referring Physician/Provider: Dr Jacki Cones Date: 01/24/2019 Duration: 22.09 mins Patient history: 63yo M with ams and jerking. EEG to evaluate for seizure Level of alertness: awake AEDs during EEG study: Lorazepam Technical aspects: This EEG study was done with scalp electrodes positioned according to the 10-20 International system of electrode placement.  Electrical activity was acquired at a sampling rate of '500Hz'  and reviewed with a high frequency filter of '70Hz'  and a low frequency filter of '1Hz' . EEG data were recorded continuously and digitally stored. DESCRIPTION: No clear posterior dominant rhythm was seen. EEG showed continuous generalized 3-'5hz'  theta-delta slowing. Hyperventilation and photic stimulation were not performed. ABNORMALITY - Continuous slow, generalized IMPRESSION: This study is suggestive of moderate diffuse encephalopathy, non specific to etiology. No seizures or epileptiform discharges were seen throughout the recording. Priyanka Barbra Sarks     Medications:   . sodium chloride 150 mL/hr at 01/25/19 2116  . potassium chloride 10 mEq (01/26/19 1031)   . bisacodyl  10 mg Oral Daily  . Chlorhexidine Gluconate Cloth  6 each Topical Daily  . docusate sodium  100 mg Oral BID  . dronabinol  10 mg Oral BID  . feeding supplement  1 Container Oral TID BM  . feeding supplement (PRO-STAT SUGAR FREE 64)  30 mL Oral BID  . furosemide  20 mg Intravenous Q6H  . metoprolol tartrate  5 mg Intravenous Q8H  . oxymetazoline  1 spray Each Nare BID  . senna-docusate  1 tablet Oral Daily  . sucralfate  1 g Oral TID AC & HS   acetaminophen, bisacodyl, docusate, LORazepam, methocarbamol, ondansetron, oxyCODONE, senna-docusate, sodium chloride flush, witch hazel-glycerin  Assessment/ Plan:   Hypercalcemia secondary to multiple myeloma.  Appears to have responded to second dose of Zometa IV.  Continues on Decadron.  Palliative medicine to help overall prognosis is poor.  Calcium continues to remain elevated.  I will add another dose of Zometa to see if we can help.  This will be the third dose of IV Zometa.  I believe the prognosis is probably poor secondary to his advanced myeloma.  We will also proceed with another round of calcitonin IV 4 micrograms per kilogram twice daily x4 days  Hypernatremia has improved.   Calciuresis.  Is receiving 150 cc  an hour.  I would continue.  Patient has great urine output    Hypokalemia will replete will probably need more than just 10 mEq.  Will need to give several doses  Myotonic jerks improved  Altered mental status.  Gabapentin discontinued  Acute kidney injury resolved   LOS: [place] '@TODAY' '@11' :46 AM

## 2019-01-26 NOTE — Progress Notes (Signed)
Occupational Therapy Treatment Patient Details Name: Jesus [name] MRN: [id number] DOB: 04/21/1955 Today's Date: 01/26/2019    History of present illness Pt is 63 year old male with history of multiple myeloma, arthritis, cervical stenosis who presented with nosebleed.  Pt was found to have severe thrombocytopenia, normocytic anemia, and hypercalcemia secondary to multiple myeloma.  Today platelets are 17 (nurse report pt received platelets since this reading) and Calcium is 13   OT comments  Pt agreeable to OOB and grooming task in standing. Pt did fatigue quickly  Follow Up Recommendations  Home health OT;Supervision/Assistance - 24 hour    Equipment Recommendations  3 in 1 bedside commode    Recommendations for Other Services      Precautions / Restrictions Precautions Precautions: Fall;Other (comment) Restrictions Weight Bearing Restrictions: No       Mobility Bed Mobility Overal bed mobility: Needs Assistance Bed Mobility: Supine to Sit     Supine to sit: Min assist     General bed mobility comments: for safety  Transfers Overall transfer level: Needs assistance Equipment used: Rolling walker (2 wheeled) Transfers: Sit to/from [facility] Sit to Stand: Min assist Stand pivot transfers: Min assist            Balance   Sitting-balance support: Feet supported;No upper extremity supported Sitting balance-Leahy Scale: Fair     Standing balance support: No upper extremity supported;Bilateral upper extremity supported Standing balance-Leahy Scale: Fair Standing balance comment: able to maintain static stand without support briefly                           ADL either performed or assessed with clinical judgement   ADL Overall ADL's : Needs assistance/impaired     Grooming: Wash/dry face;Standing Grooming Details (indicate cue type and reason): standing at EOB with walker                 Toilet Transfer: Min  guard;Ambulation;RW;Stand-pivot Toilet Transfer Details (indicate cue type and reason): bed to chair Toileting- Clothing Manipulation and Hygiene: Moderate assistance;Sit to/from stand         General ADL Comments: Pt did perform limited exercise with yellow theraband.  Pt fatigued after standing grooming task.  Pt agreed to get to chair .     Vision Baseline Vision/History: Wears glasses Wears Glasses: At all times            Cognition Arousal/Alertness: Awake/alert(sleepy d/t meds but arouses with verbal stimuli) Behavior During Therapy: [facility] for tasks assessed/performed;Flat affect Overall Cognitive Status: Within Functional Limits for tasks assessed                                                     Pertinent Vitals/ Pain       Pain Assessment: No/denies pain     Prior Functioning/Environment              Frequency  Min 2X/week        Progress Toward Goals  OT Goals(current goals can now be found in the care plan section)  Progress towards OT goals: Progressing toward goals     Plan Discharge plan remains appropriate       AM-PAC OT "6 Clicks" Daily Activity     Outcome Measure   Help from another person eating meals?: A  Little Help from another person taking care of personal grooming?: A Little Help from another person toileting, which includes using toliet, bedpan, or urinal?: A Little Help from another person bathing (including washing, rinsing, drying)?: A Little Help from another person to put on and taking off regular upper body clothing?: A Little Help from another person to put on and taking off regular lower body clothing?: A Little 6 Click Score: 18    End of Session Equipment Utilized During Treatment: Rolling walker  OT Visit Diagnosis: Unsteadiness on feet (R26.81);Muscle weakness (generalized) (M62.81)   Activity Tolerance Patient limited by fatigue   Patient Left in chair;with chair alarm set   Nurse  Communication Mobility status        Time: 3291-9166 OT Time Calculation (min): 12 min  Charges: OT General Charges $OT Visit: 1 Visit OT Treatments $Self Care/Home Management : 8-22 mins  Kari Baars, [employer] [phone] Office- [phone]      Holdyn Poyser, Edwena Felty D 01/26/2019, 1:17 PM

## 2019-01-26 NOTE — Progress Notes (Signed)
CRITICAL VALUE ALERT  Critical Value:  plts 18  k 2.8  Date & Time Notied: 12/24 0555  Provider Notified: yes  Orders Received/Actions taken: pending

## 2019-01-26 NOTE — Progress Notes (Signed)
  Speech Language Pathology Treatment: Dysphagia  Patient Details Name: Jesus [name] MRN: [id number] DOB: [birth year] Today's Date: 01/26/2019 Time: 2224-1146 SLP Time Calculation (min) (ACUTE ONLY): 16 min  Assessment / Plan / Recommendation Clinical Impression  SLP reviewed MBS with pt, showing pt's UES residuals which he did not sense thus reviewed need for pt to conduct effortful swallow and drink liquids t/o meal.  Provided pt with diagram indicated where residuals retained and reasoning for precautions.Intake at lunch documented as decent amount.  Today pt is not confused and able to articulate precautions.  Due to pt's xerostomia - recommend to start meals with liquids.  Per MBS, no aspiration noted.  If his mentation worsens and/or he becomes dyspneic, he will be at elevated aspiration risk.  Note delirium occurring earlier in hospital coarse and thus recommend to monitor his po tolerance closely.    Pt's voice appears stronger today but he continues to report some hoarseness on and off - Advised he speak to his oncologist, MD if concerned.     HPI HPI: Patient is a 63 y.o. male with PMH: multiple myeloma, arthritis, cervical stenosis, who presented to hospital with nosebleed which was not able to be controlled at home despite attempts with pressure, paper towels and ice. He had similar episodes two weeks prior where bleeding lasted 8 hours before it finally stopped. Patient has had poor appetite for past couple weeks. Wife reported he seemed more confused in AM of admission. on 12/11, RN reported patient with significant difficulty with swallowing and unable to take medications and so SLP BSE ordered.  Pt underwent BSE on 01/14/2019 and was found to be too high risk for aspiration with recommendation for npo. Underwent MBS with no aspiration and minimal pyriform sinus residuals due to decreased UES opening that pt did not sense.  He was instructed to follow solids with liquids and conduct  effortful swallow with solids.      SLP Plan  All goals met       Recommendations  Liquids provided via: Cup Medication Administration: Whole meds with liquid Supervision: Patient able to self feed Compensations: Small sips/bites;Slow rate;Effortful swallow;Minimize environmental distractions(drink liquids t/o meal)                Oral Care Recommendations: Oral care QID Follow up Recommendations: Skilled Nursing facility SLP Visit Diagnosis: Dysphagia, unspecified (R13.10) Plan: All goals met       GO                [name] 01/26/2019, 6:23 PM Kathleen Lime, MS [facility] SLP [place] Office [phone]

## 2019-01-27 LAB — TYPE AND SCREEN
ABO/RH(D): A POS
Antibody Screen: POSITIVE
Unit division: 0
Unit division: 0

## 2019-01-27 LAB — CBC WITH DIFFERENTIAL/PLATELET
Abs Immature Granulocytes: 0.05 10*3/uL (ref 0.00–0.07)
Basophils Absolute: 0 10*3/uL (ref 0.0–0.1)
Basophils Relative: 0 %
Eosinophils Absolute: 0 10*3/uL (ref 0.0–0.5)
Eosinophils Relative: 0 %
HCT: 33.9 % — ABNORMAL LOW (ref 39.0–52.0)
Hemoglobin: 11.3 g/dL — ABNORMAL LOW (ref 13.0–17.0)
Immature Granulocytes: 2 %
Lymphocytes Relative: 34 %
Lymphs Abs: 0.8 10*3/uL (ref 0.7–4.0)
MCH: 29.4 pg (ref 26.0–34.0)
MCHC: 33.3 g/dL (ref 30.0–36.0)
MCV: 88.1 fL (ref 80.0–100.0)
Monocytes Absolute: 0.4 10*3/uL (ref 0.1–1.0)
Monocytes Relative: 15 %
Neutro Abs: 1.2 10*3/uL — ABNORMAL LOW (ref 1.7–7.7)
Neutrophils Relative %: 49 %
Platelets: UNDETERMINED 10*3/uL (ref 150–400)
RBC: 3.85 MIL/uL — ABNORMAL LOW (ref 4.22–5.81)
RDW: 17.9 % — ABNORMAL HIGH (ref 11.5–15.5)
WBC: 2.4 10*3/uL — ABNORMAL LOW (ref 4.0–10.5)
nRBC: 0 % (ref 0.0–0.2)

## 2019-01-27 LAB — RENAL FUNCTION PANEL
Albumin: 3.2 g/dL — ABNORMAL LOW (ref 3.5–5.0)
Anion gap: 9 (ref 5–15)
BUN: 12 mg/dL (ref 8–23)
CO2: 23 mmol/L (ref 22–32)
Calcium: 11.9 mg/dL — ABNORMAL HIGH (ref 8.9–10.3)
Chloride: 100 mmol/L (ref 98–111)
Creatinine, Ser: 0.6 mg/dL — ABNORMAL LOW (ref 0.61–1.24)
GFR calc Af Amer: >60 mL/min (ref >60)
GFR calc non Af Amer: >60 mL/min (ref >60)
Glucose, Bld: 126 mg/dL — ABNORMAL HIGH (ref 70–99)
Phosphorus: 2.2 mg/dL — ABNORMAL LOW (ref 2.5–4.6)
Potassium: 3.4 mmol/L — ABNORMAL LOW (ref 3.5–5.1)
Sodium: 132 mmol/L — ABNORMAL LOW (ref 135–145)

## 2019-01-27 LAB — PREPARE PLATELET PHERESIS
Unit division: 0
Unit division: 0

## 2019-01-27 LAB — BPAM RBC
Blood Product Expiration Date: 202101042359
Blood Product Expiration Date: 202101172359
ISSUE DATE / TIME: 202012231257
ISSUE DATE / TIME: 202012231825
Unit Type and Rh: 6200
Unit Type and Rh: 6200

## 2019-01-27 LAB — BPAM PLATELET PHERESIS
Blood Product Expiration Date: 202012262359
Blood Product Expiration Date: 202012262359
ISSUE DATE / TIME: 202012241536
ISSUE DATE / TIME: 202012241828
Unit Type and Rh: 6200
Unit Type and Rh: 6200

## 2019-01-27 LAB — MAGNESIUM: Magnesium: 1.7 mg/dL (ref 1.7–2.4)

## 2019-01-27 MED ORDER — POTASSIUM CHLORIDE 10 MEQ/100ML IV SOLN
10.0000 meq | INTRAVENOUS | Status: AC
  Administered 2019-01-27 (×3): 10 meq via INTRAVENOUS
  Filled 2019-01-27: qty 100

## 2019-01-27 MED ORDER — POLYETHYLENE GLYCOL 3350 17 G PO PACK
17.0000 g | PACK | Freq: Every day | ORAL | Status: DC
  Administered 2019-01-27: 14:00:00 17 g via ORAL

## 2019-01-27 NOTE — Progress Notes (Signed)
PROGRESS NOTE    Jesus [name]  QMG:[id number] DOB: [birth year] DOA: 01/11/2019 PCP: Patient, No Pcp Per   Brief Narrative: Patient is a 63 year old male with history of multiple myeloma, arthritis, cervical stenosis who presented with nosebleed. He had a similar episode about 2 weeks ago which lasted for 8 hours but spontaneously resolved. He follows with oncologist at [facility] to have severe thrombocytopenia at presentation and also has normocytic anemia. Hematology/oncology consulted. Nosebleed has stopped.  Also found to have hypercalcemia secondary to [facility] given   Zometa and Decadron,IV fluids.  Prolonged hospital course due to persistent hypercalcemia, thrombocytopenia.  Nephrology also following.  Assessment & Plan:   Active Problems:   Hypercalcemia   Epistaxis   Thrombocytopenia (HCC)   Anemia   Leukopenia   Hypercalcemia:secondary to progressive multiple myeloma-s/p multiple  doses of zometa,decadron , calcitonin.  Nephrology following.Calcium slightly improved from yesterday. Currently on IV fluids and Lasix, calcitonin.  Plan to give a dose of Zometa tomorrow.  Pancytopenia:Severe thrombocytopenia, has leukopenia, anemia.  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 Recent C3 compression fracture status post corpectomy C2-C4 plating. Hematology/oncology following.  Further management as per oncology.  Myoclonic jerking:Resolved ,likely secondary to gabapentin which has been stopped.   AKI :resolved creatinine   Severe  hypokalemia : Supplemented.Will monitor levels   Epistaxis : resolved  Constipation :expected with hypercalcemia.  Continue bowel regimen.  Severe protein calorie  malnutrition: Dietitian following  Goals of care :patient remains a full code ,he has progressive multiple myeloma with poor prognosis  Acute delirium likely multifactorial. Improved after stopping Decadron.  EEG showed generalized encephalopathy.  No evidence of epileptiform activities.  CT head shows no acute changes.   Nutrition Problem: Increased nutrient needs Etiology: cancer and cancer related treatments      DVT prophylaxis:SCD Code Status: Full Family Communication: None present at the bedside Disposition Plan: Home when clinically stable.  Needs clearance from nephrology, he oncology   Consultants: Oncology, nephrology  Procedures: Bone marrow biopsy  Antimicrobials:  Anti-infectives (From admission, onward)   None      Subjective: Patient seen and examined the bedside this morning.  Hemodynamically stable.  Looks comfortable.  Alert and awake and communicates.  Denies any complaints.  Objective: Vitals:   01/26/19 1851 01/26/19 2036 01/27/19 0500 01/27/19 0527  BP: 129/82 133/78  137/75  Pulse: 98 96  95  Resp: (!) '22 17  17  ' Temp: 98.5 F (36.9 C) 98.3 F (36.8 C)  98.7 F (37.1 C)  TempSrc: Oral Oral  Oral  SpO2: 100% 100%  99%  Weight:   50.2 kg   Height:        Intake/Output Summary (Last 24 hours) at 01/27/2019 1233 Last data filed at 01/27/2019 1054 Gross per 24 hour  Intake 3201.65 ml  Output 4500 ml  Net -1298.35 ml   Filed Weights   01/25/19 0853 01/26/19 0816 01/27/19 0500  Weight: 54.5 kg 50.6 kg 50.2 kg    Examination:  General exam: Appears calm and comfortable ,Not in distress,thin built HEENT:PERRL,Oral mucosa moist, Ear/Nose normal on gross exam Respiratory system: Bilateral equal air entry, normal vesicular breath sounds, no wheezes or crackles  Cardiovascular system: S1 & S2 heard, RRR. No JVD, murmurs, rubs, gallops  or clicks. No pedal edema.  Chemo-Port on the right chest Gastrointestinal system: Abdomen is  nondistended, soft and nontender. No organomegaly or masses felt. Normal bowel sounds heard. Central nervous system: Alert and oriented. No focal neurological deficits. Extremities: No edema, no clubbing ,no cyanosis, distal peripheral pulses palpable. Skin: No rashes, lesions or ulcers,no icterus ,no pallor    Data Reviewed: I have personally reviewed following labs and imaging studies  CBC: Recent Labs  Lab 01/23/19 0348 01/24/19 0358 01/25/19 0438 01/26/19 0433 01/27/19 0954  WBC 2.6* 2.3* 1.5* 1.9* 2.4*  NEUTROABS  --   --   --   --  1.2*  HGB 7.6* 7.6* 7.0* 11.5* 11.3*  HCT 23.1* 23.5* 21.6* 33.8* 33.9*  MCV 87.8 88.3 88.9 87.3 88.1  PLT 45* 31* 20* 18* PLATELET CLUMPS NOTED ON SMEAR, UNABLE TO ESTIMATE   Basic Metabolic Panel: Recent Labs  Lab 01/23/19 0348 01/24/19 0358 01/24/19 0400 01/25/19 0438 01/26/19 0433 01/27/19 0422  NA 138 139 138 138 134* 132*  K 3.9 2.9* 2.9* 4.0 2.8* 3.4*  CL 106 106 105 107 99 100  CO2 '26 27 26 26 27 23  ' GLUCOSE 132* 98 102* 93 111* 126*  BUN '19 14 15 12 13 12  ' CREATININE 0.75 0.68 0.66 0.64 0.73 0.60*  CALCIUM 11.0* 11.0* 10.8* 11.4* 12.1* 11.9*  MG 1.9 1.9  --  1.9 1.7 1.7  PHOS 2.2*  --  2.7 3.6 3.8 2.2*   GFR: Estimated Creatinine Clearance: 67.1 mL/min (A) (by C-G formula based on SCr of 0.6 mg/dL (L)). Liver Function Tests: Recent Labs  Lab 01/23/19 0348 01/24/19 0400 01/25/19 0438 01/26/19 0433 01/27/19 0422  ALBUMIN 2.7* 2.6* 2.2* 2.9* 3.2*   No results for input(s): LIPASE, AMYLASE in the last 168 hours. No results for input(s): AMMONIA in the last 168 hours. Coagulation Profile: No results for input(s): INR, PROTIME in the last 168 hours. Cardiac Enzymes: No results for input(s): CKTOTAL, CKMB, CKMBINDEX, TROPONINI in the last 168 hours. BNP (last 3 results) No results for input(s): PROBNP in the last 8760 hours. HbA1C: No results for input(s): HGBA1C in the last 72 hours. CBG: No results for input(s):  GLUCAP in the last 168 hours. Lipid Profile: No results for input(s): CHOL, HDL, LDLCALC, TRIG, CHOLHDL, LDLDIRECT in the last 72 hours. Thyroid Function Tests: No results for input(s): TSH, T4TOTAL, FREET4, T3FREE, THYROIDAB in the last 72 hours. Anemia Panel: No results for input(s): VITAMINB12, FOLATE, FERRITIN, TIBC, IRON, RETICCTPCT in the last 72 hours. Sepsis Labs: No results for input(s): PROCALCITON, LATICACIDVEN in the last 168 hours.  No results found for this or any previous visit (from the past 240 hour(s)).       Radiology Studies: No results found.      Scheduled Meds: . bisacodyl  10 mg Oral Daily  . calcitonin  200 Units Subcutaneous BID  . Chlorhexidine Gluconate Cloth  6 each Topical Daily  . docusate sodium  100 mg Oral BID  . dronabinol  10 mg Oral BID  . feeding supplement  1 Container Oral TID BM  . feeding supplement (PRO-STAT SUGAR FREE 64)  30 mL Oral BID  . furosemide  20 mg Intravenous Q6H  . oxymetazoline  1 spray Each Nare BID  . senna-docusate  1 tablet Oral Daily  . sucralfate  1 g Oral TID AC & HS   Continuous Infusions: . sodium chloride 150 mL/hr at 01/25/19 2116  . potassium chloride    . [  START ON 01/28/2019] zoledronic acid (ZOMETA) IV       LOS: 15 days    Time spent: 35 mins.More than 50% of that time was spent in counseling and/or coordination of care.      Shelly Coss, MD [facility] Pager [phone]  If 7PM-7AM, please contact night-coverage [email or web address] Password [facility] 01/27/2019, 12:33 PM

## 2019-01-27 NOTE — Progress Notes (Signed)
[facility] ROUNDING NOTE   Subjective:   63 year old gentleman multiple myeloma arthritis cervical stenosis presented 01/11/2019 with nosebleed.  Followed by oncology at [facility] has severe thrombocytopenia and anemia.  Hypercalcemia was also noted and nephrology was consulted.  Received IV dexamethasone, calcitonin and Zometa and IV fluids.  Pressure 127/79 pulse 92 temperature 98.1 O2 sats 99% room air  Sodium 132 potassium 3.4 chloride 100 CO2 23 BUN 12 creatinine 0.6 glucose 126 calcium 111.9 phosphorus 2.2 magnesium 1.7 albumin 3.2 WBC 1.9 hemoglobin 11.5 platelets 18  Calcitonin subcu 4 units/kg x 4 doses initiated 01/14/2019 Calcitonin subcu 4 units/kg x 4 doses initiated 01/26/2019 Decadron 12 mg IV 01/18/2019 Decadron 8 mg 01/21/2019 Decadron 4 mg 01/22/2019 until 01/25/2019 Zometa 4 mg IV 01/21/2019   Objective:  Vital signs in last 24 hours:  Temp:  [98.1 F (36.7 C)-98.7 F (37.1 C)] 98.7 F (37.1 C) (12/25 0527) Pulse Rate:  [92-99] 95 (12/25 0527) Resp:  [16-22] 17 (12/25 0527) BP: (125-137)/(73-82) 137/75 (12/25 0527) SpO2:  [99 %-100 %] 99 % (12/25 0527) Weight:  [50.2 kg] 50.2 kg (12/25 0500)  Weight change: -3.9 kg Filed Weights   01/25/19 0853 01/26/19 0816 01/27/19 0500  Weight: 54.5 kg 50.6 kg 50.2 kg    Intake/Output: I/O last 3 completed shifts: In: 6364.7 [P.O.:1782; I.V.:3050; Blood:942; IV Piggyback:590.7] Out: 2542 [Urine:8750]   Intake/Output this shift:  Total I/O In: 240 [P.O.:240] Out: 300 [Urine:300]  HCW:CBJSEGBTD somewhat confused CVS: no rub Resp: cta Abd: +BS, soft, NT/Nd Ext: no edema   Basic Metabolic Panel: Recent Labs  Lab 01/23/19 0348 01/24/19 0358 01/24/19 0400 01/25/19 0438 01/26/19 0433 01/27/19 0422  NA 138 139 138 138 134* 132*  K 3.9 2.9* 2.9* 4.0 2.8* 3.4*  CL 106 106 105 107 99 100  CO2 _0 GLUCOSE 132* 98 102* 93 111* 126*  BUN _1 CREATININE 0.75  0.68 0.66 0.64 0.73 0.60*  CALCIUM 11.0* 11.0* 10.8* 11.4* 12.1* 11.9*  MG 1.9 1.9  --  1.9 1.7 1.7  PHOS 2.2*  --  2.7 3.6 3.8 2.2*    Liver Function Tests: Recent Labs  Lab 01/23/19 0348 01/24/19 0400 01/25/19 0438 01/26/19 0433 01/27/19 0422  ALBUMIN 2.7* 2.6* 2.2* 2.9* 3.2*   No results for input(s): LIPASE, AMYLASE in the last 168 hours. No results for input(s): AMMONIA in the last 168 hours.  CBC: Recent Labs  Lab 01/22/19 1541 01/23/19 0348 01/24/19 0358 01/25/19 0438 01/26/19 0433  WBC 2.4* 2.6* 2.3* 1.5* 1.9*  HGB 7.5* 7.6* 7.6* 7.0* 11.5*  HCT 23.2* 23.1* 23.5* 21.6* 33.8*  MCV 87.9 87.8 88.3 88.9 87.3  PLT 51* 45* 31* 20* 18*    Cardiac Enzymes: No results for input(s): CKTOTAL, CKMB, CKMBINDEX, TROPONINI in the last 168 hours.  BNP: Invalid input(s): POCBNP  CBG: No results for input(s): GLUCAP in the last 168 hours.  Microbiology: Results for orders placed or performed during the hospital encounter of 01/11/19  SARS CORONAVIRUS 2 (TAT 6-24 HRS) Nasopharyngeal Nasopharyngeal Swab     Status: None   Collection Time: 01/11/19  3:32 PM   Specimen: Nasopharyngeal Swab  Result Value Ref Range Status   SARS Coronavirus 2 NEGATIVE NEGATIVE Final    Comment: (NOTE) SARS-CoV-2 target nucleic acids are NOT DETECTED. The SARS-CoV-2 RNA is generally detectable in upper and lower respiratory specimens during the acute phase of infection. Negative results do not preclude SARS-CoV-2  infection, do not rule out co-infections with other pathogens, and should not be used as the sole basis for treatment or other patient management decisions. Negative results must be combined with clinical observations, patient history, and epidemiological information. The expected result is Negative. Fact Sheet for Patients: SugarRoll.be Fact Sheet for Healthcare Providers: https://www.woods-mathews.com/ This test is not yet approved or  cleared by the [place] FDA and  has been authorized for detection and/or diagnosis of SARS-CoV-2 by FDA under an Emergency Use Authorization (EUA). This EUA will remain  in effect (meaning this test can be used) for the duration of the COVID-19 declaration under Section 56 4(b)(1) of the Act, 21 U.S.C. section 360bbb-3(b)(1), unless the authorization is terminated or revoked sooner. Performed at [facility], [place] [address], [place], [place] [address]     Coagulation Studies: No results for input(s): LABPROT, INR in the last 72 hours.  Urinalysis: Recent Labs    01/24/19 1304  COLORURINE STRAW*  LABSPEC 1.006  PHURINE 7.0  GLUCOSEU NEGATIVE  HGBUR NEGATIVE  BILIRUBINUR NEGATIVE  KETONESUR NEGATIVE  PROTEINUR NEGATIVE  NITRITE NEGATIVE  LEUKOCYTESUR SMALL*      Imaging: No results found.   Medications:   . sodium chloride 150 mL/hr at 01/25/19 2116  . [START ON 01/28/2019] zoledronic acid (ZOMETA) IV     . bisacodyl  10 mg Oral Daily  . calcitonin  200 Units Subcutaneous BID  . Chlorhexidine Gluconate Cloth  6 each Topical Daily  . docusate sodium  100 mg Oral BID  . dronabinol  10 mg Oral BID  . feeding supplement  1 Container Oral TID BM  . feeding supplement (PRO-STAT SUGAR FREE 64)  30 mL Oral BID  . furosemide  20 mg Intravenous Q6H  . oxymetazoline  1 spray Each Nare BID  . senna-docusate  1 tablet Oral Daily  . sucralfate  1 g Oral TID AC & HS   acetaminophen, bisacodyl, docusate, LORazepam, methocarbamol, ondansetron, oxyCODONE, senna-docusate, sodium chloride flush, witch hazel-glycerin  Assessment/ Plan:   Hypercalcemia secondary to multiple myeloma.  Appears to have responded to second dose of Zometa IV.  Continues on Decadron.  Palliative medicine to help overall prognosis is poor.  Calcium continues to remain elevated.  I will add another dose of Zometa to start 01/28/2019 to see if we can help.  This will be the third dose of IV  Zometa.  I believe the prognosis is probably poor secondary to his advanced myeloma.  Have started calcitonin twice daily for another 4 days on 01/26/2019  Hypernatremia has improved.   Calciuresis.  Is receiving 150 cc an hour.  I would continue.  Patient has great urine output we also have added 20 mg of Lasix every 8 hourly  Hypokalemia continue to replete  Myotonic jerks improved  Altered mental status.  Gabapentin discontinued  Acute kidney injury resolved   LOS: [place] _0 _1 :39 AM

## 2019-01-28 LAB — BASIC METABOLIC PANEL
Anion gap: 7 (ref 5–15)
BUN: 14 mg/dL (ref 8–23)
CO2: 19 mmol/L — ABNORMAL LOW (ref 22–32)
Calcium: 11 mg/dL — ABNORMAL HIGH (ref 8.9–10.3)
Chloride: 104 mmol/L (ref 98–111)
Creatinine, Ser: 0.62 mg/dL (ref 0.61–1.24)
GFR calc Af Amer: >60 mL/min (ref >60)
GFR calc non Af Amer: >60 mL/min (ref >60)
Glucose, Bld: 130 mg/dL — ABNORMAL HIGH (ref 70–99)
Potassium: 3.7 mmol/L (ref 3.5–5.1)
Sodium: 130 mmol/L — ABNORMAL LOW (ref 135–145)

## 2019-01-28 LAB — RENAL FUNCTION PANEL
Albumin: 3.1 g/dL — ABNORMAL LOW (ref 3.5–5.0)
Anion gap: 8 (ref 5–15)
BUN: 10 mg/dL (ref 8–23)
CO2: 21 mmol/L — ABNORMAL LOW (ref 22–32)
Calcium: 12 mg/dL — ABNORMAL HIGH (ref 8.9–10.3)
Chloride: 104 mmol/L (ref 98–111)
Creatinine, Ser: 0.57 mg/dL — ABNORMAL LOW (ref 0.61–1.24)
GFR calc Af Amer: >60 mL/min (ref >60)
GFR calc non Af Amer: >60 mL/min (ref >60)
Glucose, Bld: 136 mg/dL — ABNORMAL HIGH (ref 70–99)
Phosphorus: 1.8 mg/dL — ABNORMAL LOW (ref 2.5–4.6)
Potassium: 2.9 mmol/L — ABNORMAL LOW (ref 3.5–5.1)
Sodium: 133 mmol/L — ABNORMAL LOW (ref 135–145)

## 2019-01-28 LAB — OSMOLALITY: Osmolality: 283 mOsm/kg (ref 275–295)

## 2019-01-28 LAB — CBC WITH DIFFERENTIAL/PLATELET
Abs Immature Granulocytes: 0.06 10*3/uL (ref 0.00–0.07)
Basophils Absolute: 0 10*3/uL (ref 0.0–0.1)
Basophils Relative: 0 %
Eosinophils Absolute: 0 10*3/uL (ref 0.0–0.5)
Eosinophils Relative: 0 %
HCT: 34.3 % — ABNORMAL LOW (ref 39.0–52.0)
Hemoglobin: 11.2 g/dL — ABNORMAL LOW (ref 13.0–17.0)
Immature Granulocytes: 2 %
Lymphocytes Relative: 33 %
Lymphs Abs: 0.8 10*3/uL (ref 0.7–4.0)
MCH: 28.9 pg (ref 26.0–34.0)
MCHC: 32.7 g/dL (ref 30.0–36.0)
MCV: 88.6 fL (ref 80.0–100.0)
Monocytes Absolute: 0.5 10*3/uL (ref 0.1–1.0)
Monocytes Relative: 18 %
Neutro Abs: 1.2 10*3/uL — ABNORMAL LOW (ref 1.7–7.7)
Neutrophils Relative %: 47 %
Platelets: 83 10*3/uL — ABNORMAL LOW (ref 150–400)
RBC: 3.87 MIL/uL — ABNORMAL LOW (ref 4.22–5.81)
RDW: 18.3 % — ABNORMAL HIGH (ref 11.5–15.5)
WBC: 2.6 10*3/uL — ABNORMAL LOW (ref 4.0–10.5)
nRBC: 0 % (ref 0.0–0.2)

## 2019-01-28 LAB — MAGNESIUM: Magnesium: 1.3 mg/dL — ABNORMAL LOW (ref 1.7–2.4)

## 2019-01-28 LAB — OSMOLALITY, URINE: Osmolality, Ur: 350 mOsm/kg (ref 300–900)

## 2019-01-28 MED ORDER — MAGNESIUM SULFATE 2 GM/50ML IV SOLN
2.0000 g | Freq: Once | INTRAVENOUS | Status: AC
  Administered 2019-01-28: 2 g via INTRAVENOUS
  Filled 2019-01-28: qty 50

## 2019-01-28 MED ORDER — POTASSIUM PHOSPHATE MONOBASIC 500 MG PO TABS
500.0000 mg | ORAL_TABLET | Freq: Three times a day (TID) | ORAL | Status: AC
  Administered 2019-01-28 – 2019-01-29 (×6): 500 mg via ORAL
  Filled 2019-01-28 (×6): qty 1

## 2019-01-28 MED ORDER — POTASSIUM CHLORIDE CRYS ER 20 MEQ PO TBCR
40.0000 meq | EXTENDED_RELEASE_TABLET | ORAL | Status: AC
  Administered 2019-01-28 (×2): 40 meq via ORAL
  Filled 2019-01-28 (×2): qty 2

## 2019-01-28 NOTE — Progress Notes (Signed)
PROGRESS NOTE    Jesus [name]  YCX:[id number] DOB: 02-12-1955 DOA: 01/11/2019 PCP: Patient, No Pcp Per   Brief Narrative: Patient is a 63 year old male with history of multiple myeloma, arthritis, cervical stenosis who presented with nosebleed. He had a similar episode about 2 weeks ago which lasted for 8 hours but spontaneously resolved. He follows with oncologist at [place] to have severe thrombocytopenia at presentation and also has normocytic anemia. Hematology/oncology consulted. Nosebleed has stopped.  Also found to have hypercalcemia secondary to [facility] given   Zometa and Decadron,IV fluids.Underwent BMB here .  Prolonged hospital course due to persistent hypercalcemia, pancytopenia.  Nephrology/oncolgy following.  Assessment & Plan:   Active Problems:   Hypercalcemia   Epistaxis   Thrombocytopenia (HCC)   Anemia   Leukopenia   Hypercalcemia:secondary to progressive multiple myeloma-s/p multiple  doses of zometa,decadron , calcitonin.  Nephrology following.Calcium today is 12. Currently on IV fluids . Given another  dose of Zometa today.  Pancytopenia:Severe thrombocytopenia, has leukopenia, anemia.  He was  transfused with 2 units of platelets on 01/26/19.  Platelets level 83 today.  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 Recent C3 compression fracture status post corpectomy C2-C4 plating. Hematology/oncology following. Underwent BMB. Further management as per oncology.Dr Irene Limbo is following.  Myoclonic jerking:Resolved ,likely secondary to gabapentin which has been stopped.   AKI :resolved    Hypokalemia, hypomagnesemia, hypophosphatemia : Supplemented.Will monitor levels   Epistaxis : resolved  Constipation :Now with diarrhea.  MiraLAX stopped  Severe protein  calorie malnutrition: Dietitian following  Goals of care :patient remains a full code ,he has progressive multiple myeloma with poor prognosis  Acute delirium :likely multifactorial. Improved after stopping Decadron.  EEG showed generalized encephalopathy.  No evidence of epileptiform activities.  CT head shows no acute changes.   Nutrition Problem: Increased nutrient needs Etiology: cancer and cancer related treatments      DVT prophylaxis:SCD Code Status: Full Family Communication: Discussed with wife on phone today Disposition Plan: Home when clinically stable.  Needs clearance from nephrology,oncology   Consultants: Oncology, nephrology  Procedures: Bone marrow biopsy  Antimicrobials:  Anti-infectives (From admission, onward)   None      Subjective: Patient seen and examined the bedside this morning.  Hemodynamically stable.  Denies any complaints.  Since last 2 to 3 days, he has been looking very comfortable  Objective: Vitals:   01/27/19 2131 01/28/19 0426 01/28/19 0601 01/28/19 1448  BP: (!) 147/80  135/73 130/74  Pulse: 96  96 99  Resp: _0 Temp: 98.8 F (37.1 C)  98.7 F (37.1 C) 97.8 F (36.6 C)  TempSrc: Oral  Oral   SpO2: 99%  100% 100%  Weight:  50.2 kg    Height:        Intake/Output Summary (Last 24 hours) at 01/28/2019 1501 Last data filed at 01/28/2019 4970 Gross per 24 hour  Intake 1892.39 ml  Output 4156 ml  Net -2263.61 ml   Filed Weights   01/26/19 0816 01/27/19 0500 01/28/19 0426  Weight: 50.6 kg 50.2 kg 50.2 kg    Examination:  General exam: Appears calm and comfortable ,Not in distress,very thin built, malnourished HEENT:PERRL,Oral mucosa moist, Ear/Nose normal on gross exam Respiratory system: Bilateral equal air entry, normal vesicular breath sounds, no wheezes or crackles  Cardiovascular system: S1 &  S2 heard, RRR. No JVD, murmurs, rubs, gallops or clicks. No pedal edema.  Chemo-Port on the right  chest Gastrointestinal system: Abdomen is nondistended, soft and nontender. No organomegaly or masses felt. Normal bowel sounds heard. Central nervous system: Alert and oriented. No focal neurological deficits. Extremities: No edema, no clubbing ,no cyanosis, distal peripheral pulses palpable. Skin: No rashes, lesions or ulcers,no icterus ,no pallor    Data Reviewed: I have personally reviewed following labs and imaging studies  CBC: Recent Labs  Lab 01/24/19 0358 01/25/19 0438 01/26/19 0433 01/27/19 0954 01/28/19 0502  WBC 2.3* 1.5* 1.9* 2.4* 2.6*  NEUTROABS  --   --   --  1.2* 1.2*  HGB 7.6* 7.0* 11.5* 11.3* 11.2*  HCT 23.5* 21.6* 33.8* 33.9* 34.3*  MCV 88.3 88.9 87.3 88.1 88.6  PLT 31* 20* 18* PLATELET CLUMPS NOTED ON SMEAR, UNABLE TO ESTIMATE 83*   Basic Metabolic Panel: Recent Labs  Lab 01/23/19 0348 01/24/19 0358 01/24/19 0400 01/25/19 0438 01/26/19 0433 01/27/19 0422 01/28/19 0502  NA 138 139 138 138 134* 132* 133*  K 3.9 2.9* 2.9* 4.0 2.8* 3.4* 2.9*  CL 106 106 105 107 99 100 104  CO2 _0 21*  GLUCOSE 132* 98 102* 93 111* 126* 136*  BUN _1 CREATININE 0.75 0.68 0.66 0.64 0.73 0.60* 0.57*  CALCIUM 11.0* 11.0* 10.8* 11.4* 12.1* 11.9* 12.0*  MG 1.9 1.9  --  1.9 1.7 1.7  --   PHOS 2.2*  --  2.7 3.6 3.8 2.2* 1.8*   GFR: Estimated Creatinine Clearance: 67.1 mL/min (A) (by C-G formula based on SCr of 0.57 mg/dL (L)). Liver Function Tests: Recent Labs  Lab 01/24/19 0400 01/25/19 0438 01/26/19 0433 01/27/19 0422 01/28/19 0502  ALBUMIN 2.6* 2.2* 2.9* 3.2* 3.1*   No results for input(s): LIPASE, AMYLASE in the last 168 hours. No results for input(s): AMMONIA in the last 168 hours. Coagulation Profile: No results for input(s): INR, PROTIME in the last 168 hours. Cardiac Enzymes: No results for input(s): CKTOTAL, CKMB, CKMBINDEX, TROPONINI in the last 168 hours. BNP (last 3 results) No results for input(s): PROBNP in the last  8760 hours. HbA1C: No results for input(s): HGBA1C in the last 72 hours. CBG: No results for input(s): GLUCAP in the last 168 hours. Lipid Profile: No results for input(s): CHOL, HDL, LDLCALC, TRIG, CHOLHDL, LDLDIRECT in the last 72 hours. Thyroid Function Tests: No results for input(s): TSH, T4TOTAL, FREET4, T3FREE, THYROIDAB in the last 72 hours. Anemia Panel: No results for input(s): VITAMINB12, FOLATE, FERRITIN, TIBC, IRON, RETICCTPCT in the last 72 hours. Sepsis Labs: No results for input(s): PROCALCITON, LATICACIDVEN in the last 168 hours.  No results found for this or any previous visit (from the past 240 hour(s)).       Radiology Studies: No results found.      Scheduled Meds: . Chlorhexidine Gluconate Cloth  6 each Topical Daily  . dronabinol  10 mg Oral BID  . feeding supplement  1 Container Oral TID BM  . feeding supplement (PRO-STAT SUGAR FREE 64)  30 mL Oral BID  . oxymetazoline  1 spray Each Nare BID  . potassium phosphate (monobasic)  500 mg Oral TID WC  . sucralfate  1 g Oral TID AC & HS   Continuous Infusions: . sodium chloride 150 mL/hr at 01/28/19 1028     LOS: 16 days    Time spent: 35 mins.More than 50% of that  time was spent in counseling and/or coordination of care.      Shelly Coss, MD [facility] Pager [phone]  If 7PM-7AM, please contact night-coverage [email or web address] Password [email or web address] 01/28/2019, 3:01 PM

## 2019-01-28 NOTE — Progress Notes (Signed)
rm 1506, [place]et[place], [place], Pt has had 8 BM's but Loose watery green, But he is taking daily Laxitives.

## 2019-01-28 NOTE — Progress Notes (Signed)
Patient ID: Jesus [name], male   DOB: 1956/01/04, 63 y.o.   MRN: [id number] [facility] Progress Note   Assessment/ Plan:   1.  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  Unfortunately has been recalcitrant with significant rebound following medical management with intravenous fluids/furosemide, calcitonin and Decadron.  Scheduled today to get intravenous zoledronic acid.  I will check a PTH level as this indeed might be primary hyperparathyroidism as a concomitant process. 2.  Hypophosphatemia: Likely secondary to hypercalcemia, ongoing phosphorus replacement. 3.  Hypokalemia: Secondary to ongoing intravenous fluids/diuretics-agree with oral replacement.  I will discontinue furosemide. 4.  Hyponatremia: I am suspecting that this is pseudohyponatremia with plasma cell dyscrasia-we will check serum osmolality and urine osmolality today. 5.  Progressive multiple myeloma with pancytopenia  Subjective:   Denies any complaints, denies chest pain or shortness of breath.  Denies any muscle cramps.   Objective:   BP 135/73 (BP Location: Left Arm)   Pulse 96   Temp 98.7 F (37.1 C) (Oral)   Resp 20   Ht 5' 7.01" (1.702 m)   Wt 50.2 kg   SpO2 100%   BMI 17.33 kg/m   Intake/Output Summary (Last 24 hours) at 01/28/2019 1159 Last data filed at 01/28/2019 0836 Gross per 24 hour  Intake 1892.39 ml  Output 4756 ml  Net -2863.61 ml   Weight change: -0.4 kg  Physical Exam: Gen: Comfortably resting in bed, watching television CVS: Pulse regular rhythm, normal rate, S1 and S2 normal Resp: Clear to auscultation, no rales/rhonchi Abd: Soft, flat, nontender Ext: No lower extremity edema.  Labs: BMET Recent Labs  Lab 01/22/19 0412 01/23/19 0348 01/24/19 0358 01/24/19 0400 01/25/19 0438 01/26/19 0433 01/27/19 0422 01/28/19 0502  NA 137 138 139 138 138 134* 132* 133*  K 4.1 3.9 2.9* 2.9* 4.0 2.8* 3.4* 2.9*  CL 101 106 106 105 107 99 100 104  CO2 '29 26 27 26 26 27  23 ' 21*  GLUCOSE 140* 132* 98 102* 93 111* 126* 136*  BUN '15 19 14 15 12 13 12 10  ' CREATININE 0.73 0.75 0.68 0.66 0.64 0.73 0.60* 0.57*  CALCIUM 12.6* 11.0* 11.0* 10.8* 11.4* 12.1* 11.9* 12.0*  PHOS 2.9 2.2*  --  2.7 3.6 3.8 2.2* 1.8*   CBC Recent Labs  Lab 01/25/19 0438 01/26/19 0433 01/27/19 0954 01/28/19 0502  WBC 1.5* 1.9* 2.4* 2.6*  NEUTROABS  --   --  1.2* 1.2*  HGB 7.0* 11.5* 11.3* 11.2*  HCT 21.6* 33.8* 33.9* 34.3*  MCV 88.9 87.3 88.1 88.6  PLT 20* 18* PLATELET CLUMPS NOTED ON SMEAR, UNABLE TO ESTIMATE 83*   Medications:    . Chlorhexidine Gluconate Cloth  6 each Topical Daily  . dronabinol  10 mg Oral BID  . feeding supplement  1 Container Oral TID BM  . feeding supplement (PRO-STAT SUGAR FREE 64)  30 mL Oral BID  . furosemide  20 mg Intravenous Q6H  . oxymetazoline  1 spray Each Nare BID  . potassium phosphate (monobasic)  500 mg Oral TID WC  . sucralfate  1 g Oral TID AC & HS   Elmarie Shiley, MD 01/28/2019, 11:59 AM

## 2019-01-29 DIAGNOSIS — D72819 Decreased white blood cell count, unspecified: Secondary | ICD-10-CM

## 2019-01-29 LAB — CBC WITH DIFFERENTIAL/PLATELET
Abs Immature Granulocytes: 0.03 10*3/uL (ref 0.00–0.07)
Basophils Absolute: 0 10*3/uL (ref 0.0–0.1)
Basophils Relative: 1 %
Eosinophils Absolute: 0 10*3/uL (ref 0.0–0.5)
Eosinophils Relative: 0 %
HCT: 29.5 % — ABNORMAL LOW (ref 39.0–52.0)
Hemoglobin: 9.6 g/dL — ABNORMAL LOW (ref 13.0–17.0)
Immature Granulocytes: 2 %
Lymphocytes Relative: 32 %
Lymphs Abs: 0.6 10*3/uL — ABNORMAL LOW (ref 0.7–4.0)
MCH: 28.9 pg (ref 26.0–34.0)
MCHC: 32.5 g/dL (ref 30.0–36.0)
MCV: 88.9 fL (ref 80.0–100.0)
Monocytes Absolute: 0.4 10*3/uL (ref 0.1–1.0)
Monocytes Relative: 20 %
Neutro Abs: 0.8 10*3/uL — ABNORMAL LOW (ref 1.7–7.7)
Neutrophils Relative %: 45 %
Platelets: 53 10*3/uL — ABNORMAL LOW (ref 150–400)
RBC: 3.32 MIL/uL — ABNORMAL LOW (ref 4.22–5.81)
RDW: 18.1 % — ABNORMAL HIGH (ref 11.5–15.5)
WBC: 1.9 10*3/uL — ABNORMAL LOW (ref 4.0–10.5)
nRBC: 0 % (ref 0.0–0.2)

## 2019-01-29 LAB — RENAL FUNCTION PANEL
Albumin: 2.7 g/dL — ABNORMAL LOW (ref 3.5–5.0)
Anion gap: 11 (ref 5–15)
BUN: 12 mg/dL (ref 8–23)
CO2: 20 mmol/L — ABNORMAL LOW (ref 22–32)
Calcium: 10.5 mg/dL — ABNORMAL HIGH (ref 8.9–10.3)
Chloride: 103 mmol/L (ref 98–111)
Creatinine, Ser: 0.55 mg/dL — ABNORMAL LOW (ref 0.61–1.24)
GFR calc Af Amer: >60 mL/min (ref >60)
GFR calc non Af Amer: >60 mL/min (ref >60)
Glucose, Bld: 100 mg/dL — ABNORMAL HIGH (ref 70–99)
Phosphorus: 1.9 mg/dL — ABNORMAL LOW (ref 2.5–4.6)
Potassium: 3.7 mmol/L (ref 3.5–5.1)
Sodium: 134 mmol/L — ABNORMAL LOW (ref 135–145)

## 2019-01-29 LAB — PHOSPHORUS: Phosphorus: 1.9 mg/dL — ABNORMAL LOW (ref 2.5–4.6)

## 2019-01-29 LAB — PARATHYROID HORMONE, INTACT (NO CA): PTH: 8 pg/mL — ABNORMAL LOW (ref 15–65)

## 2019-01-29 MED ORDER — POTASSIUM CHLORIDE CRYS ER 20 MEQ PO TBCR
20.0000 meq | EXTENDED_RELEASE_TABLET | Freq: Once | ORAL | Status: AC
  Administered 2019-01-29: 20 meq via ORAL
  Filled 2019-01-29: qty 1

## 2019-01-29 NOTE — Progress Notes (Addendum)
Patient ID: Jesus [name], male   DOB: [birth year], 63 y.o.   MRN: [id number] [facility] Progress Note   Assessment/ Plan:   1.  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  Appears recalcitrant with significant rebound following medical management with intravenous fluids/furosemide, calcitonin and Decadron.  Status post zoledronic acid yesterday with improving calcium level (corrected calcium for albumin still elevated), PTH level pending.  I will discontinue intravenous fluids at this time and encourage oral intake. 2.  Hypophosphatemia: Continue phosphorus replacement, likely renal wasting. 3.  Hypokalemia: Secondary to ongoing intravenous fluids/diuretics-adequately replaced at this time. 4.  Hyponatremia: Possibly pseudohyponatremia in the setting of plasma cell dyscrasia with normal osmolality.  Discontinue IV fluids today. 5.  Progressive multiple myeloma with pancytopenia  Subjective:   Reports to be feeling well, denies any chest pain or shortness of breath.  Denies any abdominal pain.   Objective:   BP (!) 142/93 (BP Location: Left Arm)   Pulse 97   Temp 98.7 F (37.1 C) (Oral)   Resp 20   Ht 5' 7.01" (1.702 m)   Wt 50.6 kg   SpO2 98%   BMI 17.47 kg/m   Intake/Output Summary (Last 24 hours) at 01/29/2019 1141 Last data filed at 01/29/2019 6256 Gross per 24 hour  Intake 3224.93 ml  Output 3050 ml  Net 174.93 ml   Weight change: 0.4 kg  Physical Exam: Gen: Sitting up in recliner, watching television CVS: Pulse regular rhythm, normal rate, S1 and S2 normal Resp: Clear to auscultation, no rales/rhonchi Abd: Soft, flat, nontender Ext: No lower extremity edema.  Labs: BMET Recent Labs  Lab 01/23/19 0348 01/24/19 0400 01/25/19 0438 01/26/19 0433 01/27/19 0422 01/28/19 0502 01/28/19 1450 01/29/19 0440  NA 138 138 138 134* 132* 133* 130* 134*  K 3.9 2.9* 4.0 2.8* 3.4* 2.9* 3.7 3.7  CL 106 105 107 99 100 104 104 103  CO2 _0 21*  19* 20*  GLUCOSE 132* 102* 93 111* 126* 136* 130* 100*  BUN _1 CREATININE 0.75 0.66 0.64 0.73 0.60* 0.57* 0.62 0.55*  CALCIUM 11.0* 10.8* 11.4* 12.1* 11.9* 12.0* 11.0* 10.5*  PHOS 2.2* 2.7 3.6 3.8 2.2* 1.8*  --  1.9*  1.9*   CBC Recent Labs  Lab 01/26/19 0433 01/27/19 0954 01/28/19 0502 01/29/19 0440  WBC 1.9* 2.4* 2.6* 1.9*  NEUTROABS  --  1.2* 1.2* 0.8*  HGB 11.5* 11.3* 11.2* 9.6*  HCT 33.8* 33.9* 34.3* 29.5*  MCV 87.3 88.1 88.6 88.9  PLT 18* PLATELET CLUMPS NOTED ON SMEAR, UNABLE TO ESTIMATE 83* 53*   Medications:    . Chlorhexidine Gluconate Cloth  6 each Topical Daily  . dronabinol  10 mg Oral BID  . feeding supplement  1 Container Oral TID BM  . feeding supplement (PRO-STAT SUGAR FREE 64)  30 mL Oral BID  . oxymetazoline  1 spray Each Nare BID  . potassium phosphate (monobasic)  500 mg Oral TID WC  . sucralfate  1 g Oral TID AC & HS   Elmarie Shiley, MD 01/29/2019, 11:41 AM

## 2019-01-29 NOTE — Progress Notes (Signed)
PROGRESS NOTE  Jesus [name] JXB:[id number] DOB: [birth year] DOA: 01/11/2019 PCP: Patient, No Pcp Per   LOS: 17 days   Brief Narrative / Interim history: 63 year old male with multiple myeloma, arthritis, cervical stenosis, who was admitted to the hospital on 01/11/2019 with nosebleed.  He was found to have severe thrombocytopenia as well as anemia on admission and was transfused platelets.  Heme-onc has been consulted.  He was also have found profound hypercalcemia secondary to myeloma and nephrology has been consulted.  Prolonged hospital course due to persistent hypercalcemia, pancytopenia  Subjective / 24h Interval events: Overall doing well this morning.  No chest pain, no shortness of breath.  States that he overall feels stronger.  No abdominal pain, no nausea, vomiting, diarrhea or constipation.  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  Platelets decreasing currently but will not need another transfusion for now -Oncology consulted -Underwent a bone marrow biopsy on 01/16/2019 which showed persistent involvement by plasma cell myeloma as well as mild myeloid dysplasia. -Bone scan showed uptake over the right acetabulum mid to lower T-spine (ribs and likely cervicothoracic junction.  Also showed recent C3 compression fractures status post corpectomy with C2-C4 plating  Acute kidney injury -Resolved  Hypokalemia, hypomagnesemia, hypophosphatemia -Supplement, monitor  Epistaxis -On admission, resolved  Severe protein calorie malnutrition -Dietary following  Acute metabolic encephalopathy -Likely multifactorial,  improved and today seems to be alert and oriented, appropriate.  EEG showed generalized encephalopathy without evidence of epileptiform activities, CT head without acute changes  Scheduled Meds: . Chlorhexidine Gluconate Cloth  6 each Topical Daily  . dronabinol  10 mg Oral BID  . feeding supplement  1 Container Oral TID BM  . feeding supplement (PRO-STAT SUGAR FREE 64)  30 mL Oral BID  . oxymetazoline  1 spray Each Nare BID  . potassium phosphate (monobasic)  500 mg Oral TID WC  . sucralfate  1 g Oral TID AC & HS   Continuous Infusions: PRN Meds:.acetaminophen, LORazepam, methocarbamol, ondansetron, oxyCODONE, senna-docusate, sodium chloride flush, witch hazel-glycerin  DVT prophylaxis: SCDs Code Status: Full code Family Communication: Discussed with patient Disposition Plan: To be determined  Consultants:  Oncology  Nephrology   Procedures: [facility] biopsy   Microbiology  SARS-CoV-2 12/20-negative  Antimicrobials: None     Objective: Vitals:   01/28/19 0601 01/28/19 1448 01/28/19 2127 01/29/19 0527  BP: 135/73 130/74 130/79 (!) 142/93  Pulse: 96 99 93 97  Resp: '20 20 18 20  ' Temp: 98.7 F (37.1 C) 97.8 F (36.6 C) 98.7 F (37.1 C) 98.7 F (37.1 C)  TempSrc: Oral  Oral Oral  SpO2: 100% 100% 97% 98%  Weight:    50.6 kg  Height:        Intake/Output Summary (Last 24 hours) at 01/29/2019 1317 Last data filed at 01/29/2019 1308 Gross per 24 hour  Intake 3224.93 ml  Output 3050 ml  Net 174.93 ml   Filed Weights   01/27/19 0500 01/28/19 0426 01/29/19 0527  Weight: 50.2 kg 50.2 kg 50.6 kg    Examination:  Constitutional: NAD Eyes: no scleral icterus ENMT: Mucous membranes are moist.  Neck: normal, supple  Respiratory: clear to auscultation bilaterally, no wheezing, no crackles. Normal respiratory effort.  Cardiovascular: Regular rate and rhythm, no murmurs / rubs / gallops. No LE edema.  Abdomen: non distended, no tenderness. Skin: no rashes Neurologic:  Nonfocal, equal strength Psychiatric: Normal judgment and insight. Alert and oriented x 3. Normal mood.    Data Reviewed: I have independently reviewed following labs and imaging studies   CBC: Recent Labs  Lab 01/25/19 0438 01/26/19 0433 01/27/19 0954 01/28/19 0502 01/29/19 0440  WBC 1.5* 1.9* 2.4* 2.6* 1.9*  NEUTROABS  --   --  1.2* 1.2* 0.8*  HGB 7.0* 11.5* 11.3* 11.2* 9.6*  HCT 21.6* 33.8* 33.9* 34.3* 29.5*  MCV 88.9 87.3 88.1 88.6 88.9  PLT 20* 18* PLATELET CLUMPS NOTED ON SMEAR, UNABLE TO ESTIMATE 83* 53*   Basic Metabolic Panel: Recent Labs  Lab 01/24/19 0358 01/25/19 0438 01/26/19 0433 01/27/19 0422 01/28/19 0502 01/28/19 1450 01/28/19 1458 01/29/19 0440  NA 139 138 134* 132* 133* 130*  --  134*  K 2.9* 4.0 2.8* 3.4* 2.9* 3.7  --  3.7  CL 106 107 99 100 104 104  --  103  CO2 '27 26 27 23 ' 21* 19*  --  20*  GLUCOSE 98 93 111* 126* 136* 130*  --  100*  BUN '14 12 13 12 10 14  ' --  12  CREATININE 0.68 0.64 0.73 0.60* 0.57* 0.62  --  0.55*  CALCIUM 11.0* 11.4* 12.1* 11.9* 12.0* 11.0*  --  10.5*  MG 1.9 1.9 1.7 1.7  --   --  1.3*  --   PHOS  --  3.6 3.8 2.2* 1.8*  --   --  1.9*  1.9*   Liver Function Tests: Recent Labs  Lab 01/25/19 0438 01/26/19 0433 01/27/19 0422 01/28/19 0502 01/29/19 0440  ALBUMIN 2.2* 2.9* 3.2* 3.1* 2.7*   Coagulation Profile: No results for input(s): INR, PROTIME in the last 168 hours. HbA1C: No results for input(s): HGBA1C in the last 72 hours. CBG: No results for input(s): GLUCAP in the last 168 hours.  No results found for this or any previous visit (from the past 240 hour(s)).   Radiology Studies: No results found.  Marzetta Board, MD, PhD [facility]  Between 7 am - 7 pm I am available, please contact me via Amion or Securechat  Between 7 pm - 7 am I am not available, please contact night coverage MD/APP via Amion

## 2019-01-30 ENCOUNTER — Encounter (HOSPITAL_COMMUNITY): Payer: Self-pay | Admitting: Hematology

## 2019-01-30 LAB — CBC
HCT: 30.6 % — ABNORMAL LOW (ref 39.0–52.0)
Hemoglobin: 10.1 g/dL — ABNORMAL LOW (ref 13.0–17.0)
MCH: 29.2 pg (ref 26.0–34.0)
MCHC: 33 g/dL (ref 30.0–36.0)
MCV: 88.4 fL (ref 80.0–100.0)
Platelets: 40 10*3/uL — ABNORMAL LOW (ref 150–400)
RBC: 3.46 MIL/uL — ABNORMAL LOW (ref 4.22–5.81)
RDW: 18 % — ABNORMAL HIGH (ref 11.5–15.5)
WBC: 1.9 10*3/uL — ABNORMAL LOW (ref 4.0–10.5)
nRBC: 0 % (ref 0.0–0.2)

## 2019-01-30 LAB — COMPREHENSIVE METABOLIC PANEL
ALT: 80 U/L — ABNORMAL HIGH (ref 0–44)
AST: 52 U/L — ABNORMAL HIGH (ref 15–41)
Albumin: 2.8 g/dL — ABNORMAL LOW (ref 3.5–5.0)
Alkaline Phosphatase: 426 U/L — ABNORMAL HIGH (ref 38–126)
Anion gap: 7 (ref 5–15)
BUN: 12 mg/dL (ref 8–23)
CO2: 23 mmol/L (ref 22–32)
Calcium: 11.9 mg/dL — ABNORMAL HIGH (ref 8.9–10.3)
Chloride: 105 mmol/L (ref 98–111)
Creatinine, Ser: 0.53 mg/dL — ABNORMAL LOW (ref 0.61–1.24)
GFR calc Af Amer: >60 mL/min (ref >60)
GFR calc non Af Amer: >60 mL/min (ref >60)
Glucose, Bld: 100 mg/dL — ABNORMAL HIGH (ref 70–99)
Potassium: 3.6 mmol/L (ref 3.5–5.1)
Sodium: 135 mmol/L (ref 135–145)
Total Bilirubin: 0.4 mg/dL (ref 0.3–1.2)
Total Protein: 7.2 g/dL (ref 6.5–8.1)

## 2019-01-30 LAB — PHOSPHORUS: Phosphorus: 2.2 mg/dL — ABNORMAL LOW (ref 2.5–4.6)

## 2019-01-30 LAB — MAGNESIUM: Magnesium: 1.8 mg/dL (ref 1.7–2.4)

## 2019-01-30 MED ORDER — SODIUM CHLORIDE 0.9 % IV BOLUS
250.0000 mL | Freq: Once | INTRAVENOUS | Status: AC
  Administered 2019-01-30: 250 mL via INTRAVENOUS

## 2019-01-30 MED ORDER — FUROSEMIDE 10 MG/ML IJ SOLN
40.0000 mg | Freq: Once | INTRAMUSCULAR | Status: AC
  Administered 2019-01-30: 40 mg via INTRAVENOUS
  Filled 2019-01-30: qty 4

## 2019-01-30 NOTE — Progress Notes (Signed)
Physical Therapy Treatment Patient Details Name: Jesus [name] MRN: [id number] DOB: [birth year] Today's Date: 01/30/2019    History of Present Illness Pt is 63 year old male with history of multiple myeloma, arthritis, cervical stenosis who presented with nosebleed.  Pt was found to have severe thrombocytopenia, normocytic anemia, and hypercalcemia secondary to multiple myeloma.  Today platelets are 17 (nurse report pt received platelets since this reading) and Calcium is 13    PT Comments    Pt in bed with spouse at bedside.  Assisted OOB to amb in hallway.  General bed mobility comments: assist with scooting to EOB and assist with B LE up onto bed. General transfer comment: cues for hand placement, incr time, min/guard for safety, transition to RW   Prior pt did not use a walker.  General Gait Details: cues for posture, RW safety, mildly unsteady however no overt LOB.    Follow Up Recommendations  Home health PT;Supervision for mobility/OOB     Equipment Recommendations  Rolling walker with 5" wheels    Recommendations for Other Services       Precautions / Restrictions Precautions Precautions: Fall Restrictions Weight Bearing Restrictions: No    Mobility  Bed Mobility Overal bed mobility: Needs Assistance Bed Mobility: Supine to Sit;Sit to Supine     Supine to sit: Min assist Sit to supine: Min assist   General bed mobility comments: assist with scooting to EOB and assist with B LE up onto bed.  Transfers Overall transfer level: Needs assistance Equipment used: Rolling walker (2 wheeled) Transfers: Sit to/from [employer] Transfers   Stand pivot transfers: Min assist       General transfer comment: cues for hand placement, incr time, min/guard for safety, transition to RW   Prior pt did not use a walker  Ambulation/Gait Ambulation/Gait assistance: Supervision;Min guard Gait Distance (Feet): 145 Feet Assistive device: Rolling walker (2 wheeled) Gait  Pattern/deviations: Step-through pattern;Narrow base of support;Trunk flexed Gait velocity: decreased   General Gait Details: cues for posture, RW safety, mildly unsteady however no overt LOB   Stairs             Wheelchair Mobility    Modified Rankin (Stroke Patients Only)       Balance                                            Cognition Arousal/Alertness: Awake/alert Behavior During Therapy: Flat affect Overall Cognitive Status: Within Functional Limits for tasks assessed                                 General Comments: Very low voice volume      Exercises      General Comments        Pertinent Vitals/Pain Pain Assessment: No/denies pain    Home Living                      Prior Function            PT Goals (current goals can now be found in the care plan section) Progress towards PT goals: Progressing toward goals    Frequency    Min 3X/week      PT Plan Current plan remains appropriate    Co-evaluation  AM-PAC PT "6 Clicks" Mobility   Outcome Measure  Help needed turning from your back to your side while in a flat bed without using bedrails?: A Little Help needed moving from lying on your back to sitting on the side of a flat bed without using bedrails?: A Little Help needed moving to and from a bed to a chair (including a wheelchair)?: A Little Help needed standing up from a chair using your arms (e.g., wheelchair or bedside chair)?: A Little Help needed to walk in hospital room?: A Little Help needed climbing 3-5 steps with a railing? : A Little 6 Click Score: 18    End of Session Equipment Utilized During Treatment: Gait belt Activity Tolerance: Patient tolerated treatment well Patient left: in bed;with call bell/phone within reach;with bed alarm set;with family/visitor present   PT Visit Diagnosis: Unsteadiness on feet (R26.81);Other abnormalities of gait and mobility  (R26.89)     Time: 0100-7121 PT Time Calculation (min) (ACUTE ONLY): 18 min  Charges:  $Gait Training: 8-22 mins                     Rica Koyanagi  PTA Acute  Rehabilitation Services Pager      [phone] Office      [phone]

## 2019-01-30 NOTE — Progress Notes (Signed)
PROGRESS NOTE  Jesus [name] [id number]:[id number] DOB: 1955/10/29 DOA: 01/11/2019 PCP: Patient, No Pcp Per   LOS: 18 days   Brief Narrative / Interim history: 63 year old male with multiple myeloma, arthritis, cervical stenosis, who was admitted to the hospital on 01/11/2019 with nosebleed.  He was found to have severe thrombocytopenia as well as anemia on admission and was transfused platelets.  Heme-onc has been consulted.  He was also have found profound hypercalcemia secondary to myeloma and nephrology has been consulted.  Prolonged hospital course due to persistent hypercalcemia, pancytopenia  Subjective / 24h Interval events: No complaints.  Does not talk much.  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  Platelets decreasing currently but will not need another transfusion for now.  Continue to monitor -Oncology consulted -Underwent a bone marrow biopsy on 01/16/2019 which showed persistent involvement by plasma cell myeloma as well as mild myeloid dysplasia. -Bone scan showed uptake over the right acetabulum mid to lower T-spine (ribs and likely cervicothoracic junction.  Also showed recent C3 compression fractures status post corpectomy with C2-C4 plating  Acute kidney injury -Resolved  Hypokalemia, hypomagnesemia, hypophosphatemia -Supplement again today  Epistaxis -On admission, resolved, no further bleeding  Severe protein calorie malnutrition -Dietary following  Acute metabolic encephalopathy -Likely multifactorial, improved and today seems to be alert and oriented,  appropriate.  EEG showed generalized encephalopathy without evidence of epileptiform activities, CT head without acute changes  Scheduled Meds: . Chlorhexidine Gluconate Cloth  6 each Topical Daily  . dronabinol  10 mg Oral BID  . feeding supplement  1 Container Oral TID BM  . feeding supplement (PRO-STAT SUGAR FREE 64)  30 mL Oral BID  . furosemide  40 mg Intravenous Once  . oxymetazoline  1 spray Each Nare BID  . sucralfate  1 g Oral TID AC & HS   Continuous Infusions: . sodium chloride     PRN Meds:.acetaminophen, LORazepam, methocarbamol, ondansetron, oxyCODONE, senna-docusate, sodium chloride flush, witch hazel-glycerin  DVT prophylaxis: SCDs Code Status: Full code Family Communication: Discussed with patient Disposition Plan: To be determined  Consultants:  Oncology  Nephrology   Procedures: BM biopsy   Microbiology  SARS-CoV-2 12/20-negative  Antimicrobials: None     Objective: Vitals:   01/30/19 0449 01/30/19 0500 01/30/19 0600 01/30/19 0920  BP: 120/88   129/73  Pulse: 100   95  Resp: 19     Temp: 98.8 F (37.1 C)     TempSrc: Oral     SpO2: 98%     Weight:  51.2 kg 51.2 kg   Height:        Intake/Output Summary (Last 24 hours) at 01/30/2019 1314 Last data filed at 01/30/2019 1000 Gross per 24 hour  Intake 790 ml  Output 4100 ml  Net -3310 ml   Filed Weights   01/29/19 0527 01/30/19 0500 01/30/19 0600  Weight: 50.6 kg 51.2 kg 51.2 kg    Examination:  Constitutional: No distress Eyes: No icterus ENMT: mmm Neck: normal, supple  Respiratory: Clear bilaterally without wheezing or crackles Cardiovascular: rrr, no edema Abdomen: Soft, nontender, nondistended, bowel sounds positive Skin: no rashes Neurologic: No focal deficits, equal strength Psychiatric: Normal  judgment and insight. Alert and oriented x 3. Normal mood.    Data Reviewed: I have independently reviewed following labs and imaging studies   CBC: Recent Labs  Lab  02-09-19 0433 01/27/19 0954 01/28/19 0502 01/29/19 0440 01/30/19 0304  WBC 1.9* 2.4* 2.6* 1.9* 1.9*  NEUTROABS  --  1.2* 1.2* 0.8*  --   HGB 11.5* 11.3* 11.2* 9.6* 10.1*  HCT 33.8* 33.9* 34.3* 29.5* 30.6*  MCV 87.3 88.1 88.6 88.9 88.4  PLT 18* PLATELET CLUMPS NOTED ON SMEAR, UNABLE TO ESTIMATE 83* 53* 40*   Basic Metabolic Panel: Recent Labs  Lab 01/25/19 0438 February 09, 2019 0433 01/27/19 0422 01/28/19 0502 01/28/19 1450 01/28/19 1458 01/29/19 0440 01/30/19 0304  NA 138 134* 132* 133* 130*  --  134* 135  K 4.0 2.8* 3.4* 2.9* 3.7  --  3.7 3.6  CL 107 99 100 104 104  --  103 105  CO2 _0 21* 19*  --  20* 23  GLUCOSE 93 111* 126* 136* 130*  --  100* 100*  BUN _1 --  12 12  CREATININE 0.64 0.73 0.60* 0.57* 0.62  --  0.55* 0.53*  CALCIUM 11.4* 12.1* 11.9* 12.0* 11.0*  --  10.5* 11.9*  MG 1.9 1.7 1.7  --   --  1.3*  --  1.8  PHOS 3.6 3.8 2.2* 1.8*  --   --  1.9*  1.9* 2.2*   Liver Function Tests: Recent Labs  Lab Feb 09, 2019 0433 01/27/19 0422 01/28/19 0502 01/29/19 0440 01/30/19 0304  AST  --   --   --   --  52*  ALT  --   --   --   --  80*  ALKPHOS  --   --   --   --  426*  BILITOT  --   --   --   --  0.4  PROT  --   --   --   --  7.2  ALBUMIN 2.9* 3.2* 3.1* 2.7* 2.8*   Coagulation Profile: No results for input(s): INR, PROTIME in the last 168 hours. HbA1C: No results for input(s): HGBA1C in the last 72 hours. CBG: No results for input(s): GLUCAP in the last 168 hours.  No results found for this or any previous visit (from the past 240 hour(s)).   Radiology Studies: No results found.  Marzetta Board, MD, PhD [facility]  Between 7 am - 7 pm I am available, please contact me via Amion or Securechat  Between 7 pm - 7 am I am not available, please contact night coverage MD/APP via Amion

## 2019-01-30 NOTE — Progress Notes (Signed)
Patient ID: CHE RACHAL, male   DOB: [birth year], 63 y.o.   MRN: [id number] [facility] Progress Note   Assessment/ Plan:   1.  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  Pt is sp medical management with intravenous fluids/furosemide, calcitonin and Decadron as well as 3 doses of zometa and hypercalcemia is again worsening, consistent w/ refractory hypercalcemia of malignancy. Not much else to offer.  Will sign off.  2.  Hypophosphatemia: Continue phosphorus replacement, likely renal wasting 3.  Hyponatremia: Possibly pseudohyponatremia in the setting of plasma cell dyscrasia with normal osmolality.  Discontinue IV fluids today. 4.  Progressive multiple myeloma with pancytopenia  Kelly Splinter, MD 01/30/2019, 4:10 PM    Subjective:   Ca up again 11.9   Objective:   BP (!) 142/74 (BP Location: Left Arm)   Pulse 95   Temp 98 F (36.7 C)   Resp 18   Ht 5' 7.01" (1.702 m)   Wt 51.2 kg   SpO2 100%   BMI 17.67 kg/m   Intake/Output Summary (Last 24 hours) at 01/30/2019 1607 Last data filed at 01/30/2019 1421 Gross per 24 hour  Intake 1144 ml  Output 4100 ml  Net -2956 ml   Weight change: 0.6 kg  Physical Exam: Gen: no distress CVS: Pulse regular rhythm, normal rate, S1 and S2 normal Resp: Clear to auscultation, no rales/rhonchi Abd: Soft, flat, nontender Ext: No lower extremity edema.  Labs: BMET Recent Labs  Lab 01/24/19 0400 01/25/19 0438 01/26/19 0433 01/27/19 0422 01/28/19 0502 01/28/19 1450 01/29/19 0440 01/30/19 0304  NA 138 138 134* 132* 133* 130* 134* 135  K 2.9* 4.0 2.8* 3.4* 2.9* 3.7 3.7 3.6  CL 105 107 99 100 104 104 103 105  CO2 '26 26 27 23 ' 21* 19* 20* 23  GLUCOSE 102* 93 111* 126* 136* 130* 100* 100*  BUN '15 12 13 12 10 14 12 12  ' CREATININE 0.66 0.64 0.73 0.60* 0.57* 0.62 0.55* 0.53*  CALCIUM 10.8* 11.4* 12.1* 11.9* 12.0* 11.0* 10.5* 11.9*  PHOS 2.7 3.6 3.8 2.2* 1.8*  --  1.9*  1.9* 2.2*   CBC Recent Labs  Lab  01/27/19 0954 01/28/19 0502 01/29/19 0440 01/30/19 0304  WBC 2.4* 2.6* 1.9* 1.9*  NEUTROABS 1.2* 1.2* 0.8*  --   HGB 11.3* 11.2* 9.6* 10.1*  HCT 33.9* 34.3* 29.5* 30.6*  MCV 88.1 88.6 88.9 88.4  PLT PLATELET CLUMPS NOTED ON SMEAR, UNABLE TO ESTIMATE 83* 53* 40*   Medications:    . Chlorhexidine Gluconate Cloth  6 each Topical Daily  . dronabinol  10 mg Oral BID  . feeding supplement  1 Container Oral TID BM  . feeding supplement (PRO-STAT SUGAR FREE 64)  30 mL Oral BID  . furosemide  40 mg Intravenous Once  . oxymetazoline  1 spray Each Nare BID  . sucralfate  1 g Oral TID AC & HS

## 2019-01-30 NOTE — Plan of Care (Signed)
  Problem: Clinical Measurements: Goal: Ability to maintain clinical measurements within normal limits will improve Outcome: Progressing Goal: Will remain free from infection Outcome: Progressing Goal: Diagnostic test results will improve Outcome: Progressing   Problem: Activity: Goal: Risk for activity intolerance will decrease Outcome: Progressing   Problem: Elimination: Goal: Will not experience complications related to bowel motility Outcome: Progressing   Problem: Nutrition: Goal: Adequate nutrition will be maintained Outcome: Progressing   Problem: Skin Integrity: Goal: Risk for impaired skin integrity will decrease Outcome: Progressing

## 2019-01-30 NOTE — Progress Notes (Signed)
HEMATOLOGY-ONCOLOGY PROGRESS NOTE  SUBJECTIVE: I stopped to visit with Jesus [name] today.  He remains hospitalized for persistent hypercalcemia and pancytopenia.  He is laying in the bed at the time of visit this morning.  He has no complaints.  Does not really talk much.  He did not answer me when I asked if he was eating and drinking and working with therapy.  He has no complaints this morning.  Oncology History  Multiple myeloma not having achieved remission ([place])  04/20/2018 Initial Diagnosis   Multiple myeloma not having achieved remission ([place])   04/25/2018 - 06/19/2018 Chemotherapy   The patient had carfilzomib (KYPROLIS) 90 mg in dextrose 5 % 100 mL chemo infusion, 92 mg (100 % of original dose 56 mg/m2), Intravenous, Once, 2 of 4 cycles Dose modification: 56 mg/m2 (original dose 56 mg/m2, Cycle 1, Reason: Provider Judgment) Administration: 90 mg (04/25/2018), 90 mg (05/09/2018), 90 mg (05/23/2018), 90 mg (06/06/2018)  for chemotherapy treatment.    06/17/2018 -  Chemotherapy   The patient had daratumumab (DARZALEX) 900 mg in sodium chloride 0.9 % 955 mL (0.9 mg/mL) chemo infusion, 15.4 mg/kg = 920 mg, Intravenous, Once, 1 of 1 cycle Administration: 900 mg (06/17/2018) daratumumab (DARZALEX) 900 mg in sodium chloride 0.9 % 455 mL (1.8 mg/mL) chemo infusion, 15.4 mg/kg = 920 mg, Intravenous, Once, 1 of 1 cycle Administration: 900 mg (06/24/2018) daratumumab (DARZALEX) 900 mg in sodium chloride 0.9 % 455 mL chemo infusion, 15.7 mg/kg = 920 mg, Intravenous, Once, 7 of 8 cycles Administration: 900 mg (07/01/2018), 900 mg (07/08/2018), 900 mg (07/15/2018), 900 mg (07/22/2018), 900 mg (07/29/2018), 900 mg (08/04/2018), 900 mg (08/12/2018), 900 mg (08/26/2018), 900 mg (10/21/2018), 900 mg (11/18/2018), 900 mg (09/09/2018), 900 mg (09/23/2018), 900 mg (10/07/2018), 900 mg (12/02/2018), 900 mg (12/16/2018), 900 mg (12/30/2018)  for chemotherapy treatment.       REVIEW OF SYSTEMS:   A full review of systems was  noncontributory except as noted in the HPI.  I have reviewed the past medical history, past surgical history, social history and family history with the patient and they are unchanged from previous note.   PHYSICAL EXAMINATION: ECOG PERFORMANCE STATUS: 3 - Symptomatic, >50% confined to bed  Vitals:   01/30/19 0920 01/30/19 1401  BP: 129/73 (!) 142/74  Pulse: 95 95  Resp:  18  Temp:  98 F (36.7 C)  SpO2:  100%   Filed Weights   01/29/19 0527 01/30/19 0500 01/30/19 0600  Weight: 111 lb 8.8 oz (50.6 kg) 112 lb 14 oz (51.2 kg) 112 lb 14 oz (51.2 kg)    Intake/Output from previous day: 12/27 0701 - 12/28 0700 In: -  Out: 3600 [Urine:3600]  GENERAL:alert, no distress and comfortable SKIN: skin color, texture, turgor are normal, no rashes or significant lesions EYES: normal, Conjunctiva are pink and non-injected, sclera clear OROPHARYNX:no exudate, no erythema and lips, buccal mucosa, and tongue normal  NECK: supple, thyroid normal size, non-tender, without nodularity LYMPH:  no palpable lymphadenopathy in the cervical, axillary or inguinal LUNGS: clear to auscultation and percussion with normal breathing effort HEART: regular rate & rhythm and no murmurs and no lower extremity edema ABDOMEN:abdomen soft, non-tender and normal bowel sounds Musculoskeletal:no cyanosis of digits and no clubbing  NEURO: alert & oriented x 3 with fluent speech, no focal motor/sensory deficits  LABORATORY DATA:  I have reviewed the data as listed CMP Latest Ref Rng & Units 01/30/2019 01/29/2019 01/28/2019  Glucose 70 - 99 mg/dL 100(H) 100(H) 130(H)  BUN 8 - 23 mg/dL _0 Creatinine 0.61 - 1.24 mg/dL 0.53(L) 0.55(L) 0.62  Sodium 135 - 145 mmol/L 135 134(L) 130(L)  Potassium 3.5 - 5.1 mmol/L 3.6 3.7 3.7  Chloride 98 - 111 mmol/L 105 103 104  CO2 22 - 32 mmol/L 23 20(L) 19(L)  Calcium 8.9 - 10.3 mg/dL 11.9(H) 10.5(H) 11.0(H)  Total Protein 6.5 - 8.1 g/dL 7.2 - -  Total Bilirubin 0.3 - 1.2  mg/dL 0.4 - -  Alkaline Phos 38 - 126 U/L 426(H) - -  AST 15 - 41 U/L 52(H) - -  ALT 0 - 44 U/L 80(H) - -    Lab Results  Component Value Date   WBC 1.9 (L) 01/30/2019   HGB 10.1 (L) 01/30/2019   HCT 30.6 (L) 01/30/2019   MCV 88.4 01/30/2019   PLT 40 (L) 01/30/2019   NEUTROABS 0.8 (L) 01/29/2019    CT HEAD WO CONTRAST  Result Date: 01/24/2019 CLINICAL DATA:  [place]irium EXAM: CT HEAD WITHOUT CONTRAST TECHNIQUE: Contiguous axial images were obtained from the base of the skull through the vertex without intravenous contrast. COMPARISON:  None. FINDINGS: Brain: No evidence of acute infarction, hemorrhage, hydrocephalus, extra-axial collection or mass lesion/mass effect. Vascular: Negative for hyperdense vessel Skull: Negative Sinuses/Orbits: Paranasal sinuses clear. Negative orbit bilaterally. Bilateral cataract surgery. Other: None IMPRESSION: Negative CT head.  No acute abnormality Electronically Signed   By: Franchot Gallo M.D.   On: 01/24/2019 14:38   NM Bone Scan Whole Body  Result Date: 01/12/2019 CLINICAL DATA:  History of multiple myeloma with acute back pain. EXAM: NUCLEAR MEDICINE WHOLE BODY BONE SCAN TECHNIQUE: Whole body anterior and posterior images were obtained approximately 3 hours after intravenous injection of radiopharmaceutical. RADIOPHARMACEUTICALS:  21.8 mCi Technetium-70mMDP IV COMPARISON:  PET-CT 10/13/2018 FINDINGS: Examination demonstrates patchy increased uptake over the mid to lower thoracic spine over known areas of fusion hardware and bone destruction representing patient's known multiple myeloma. Mild uptake over the right acetabulum likely representing patient's multiple myeloma. Minimal hazy uptake on the anterior image at the cervicothoracic junction which may represent patient's known myelomatous involvement of the cervical spine. Minimal patchy uptake over several anterior upper ribs bilaterally which may be due to patient's myomatous disease. Minimal  degenerative changes over the shoulders and knees. IMPRESSION: 1. Uptake over the right acetabulum, mid to lower thoracic spine, upper anterior ribs and possibly cervicothoracic junction likely representing patient's known multiple myeloma. 2.  Degenerative changes as described. Electronically Signed   By: DMarin OlpM.D.   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 He presents with pancytopenia and severe hypercalcemia. He requires bone marrow biopsy to evaluate for progression of disease. EXAM: CT GUIDED BONE MARROW ASPIRATION AND CORE BIOPSY Interventional Radiologist:  HCriselda Peaches MD MEDICATIONS: None. ANESTHESIA/SEDATION: Moderate (conscious) sedation was employed during this procedure. A total of 2 milligrams versed and 100 micrograms fentanyl were administered intravenously. The patient's level of consciousness and vital signs were monitored continuously by radiology nursing throughout the procedure under my direct supervision. Total monitored sedation time: 10 minutes FLUOROSCOPY TIME:  None COMPLICATIONS: None immediate. Estimated blood loss: <25 mL PROCEDURE: Informed written consent was obtained from the patient after a thorough discussion of the procedural risks, benefits and alternatives. All questions were addressed. Maximal Sterile Barrier Technique was utilized including caps, mask, sterile gowns, sterile gloves, sterile drape,  hand hygiene and skin antiseptic. A timeout was performed prior to the initiation of the procedure. The patient was positioned prone and non-contrast localization CT was performed of the pelvis to demonstrate the iliac marrow spaces. Maximal barrier sterile technique utilized including caps, mask, sterile gowns, sterile gloves, large sterile drape, hand hygiene, and betadine prep. Under sterile conditions and local anesthesia, an 11  gauge coaxial bone biopsy needle was advanced into the right iliac marrow space. Needle position was confirmed with CT imaging. Initially, bone marrow aspiration was performed. Next, the 11 gauge outer cannula was utilized to obtain a right iliac bone marrow core biopsy. Needle was removed. Hemostasis was obtained with compression. The patient tolerated the procedure well. Samples were prepared with the cytotechnologist. IMPRESSION: Technically successful CT-guided bone marrow biopsy and aspiration. Signed, Criselda Peaches, MD, [facility] Radiology Electronically Signed   By: Jacqulynn Cadet M.D.   On: 01/16/2019 10:58   DG Swallowing Func-Speech Pathology  Result Date: 01/16/2019 Objective Swallowing Evaluation: Type of Study: Bedside Swallow Evaluation  Patient Details Name: Jesus [name] MRN: [id number] Date of Birth: 1955/12/07 Today's Date: 01/16/2019 Time: SLP Start Time (ACUTE ONLY): 1305 -SLP Stop Time (ACUTE ONLY): 1335 SLP Time Calculation (min) (ACUTE ONLY): 30 min Past Medical History: Past Medical History: Diagnosis Date . Arthritis  . Cancer ([place])   multiple myeloma . Cervical stenosis of spinal canal  . Wears glasses  Past Surgical History: Past Surgical History: Procedure Laterality Date . ANTERIOR CERVICAL CORPECTOMY N/A 11/04/2018  Procedure: Cervical three Corpectomy with Cervical two to Cervical four Plating;  Surgeon: Erline Levine, MD;  Location: [facility];  Service: Neurosurgery;  Laterality: N/A; . APPENDECTOMY   . BACK SURGERY   . EYE SURGERY Bilateral   cataract surgery with lens implants . HERNIA REPAIR Left   inguinal  . IR IMAGING GUIDED PORT INSERTION  06/21/2018 . ROTATOR CUFF REPAIR Left  HPI: Patient is a 63 y.o. male with PMH: multiple myeloma, arthritis, cervical stenosis, who presented to hospital with nosebleed which was not able to be controlled at home despite attempts with pressure, paper towels and ice. He had  similar episodes two weeks prior where bleeding lasted 8 hours before it finally stopped. Patient has had poor appetite for past couple weeks. Wife reported he seemed more confused in AM of admission. on 12/11, RN reported patient with significant difficulty with swallowing and unable to take medications and so SLP BSE ordered.  Pt underwent BSE on 01/14/2019 and was found to be too high risk for aspiration with recommendation for npo.  Subjective: pt awake in chair in flouro suite Assessment / Plan / Recommendation CHL IP CLINICAL IMPRESSIONS 01/16/2019 Clinical Impression Pt presents with minimal oropharyngeal dysphagia without aspiration or laryngeal penetration of any consistency tested.  In general, pt's swallow is strong and timely with adequate airway protection even with sequential liquid bolus swallows.  He did demonstrate difficulty orally transiting tablet with thin barium - and after 3 unsuccesful attempts, pudding effective to transit.  In addition, decreased UES relaxation noted when swallowing masticated cracker which results in residuals without pt awareness.  Liquid swallow faciliated clearance but also resulted in trace backflow, thus recommend strict esophageal precautions.  Given pt's generalized weakness and decreased UES clearance with solids (not sensate) - dys3 diet advised at this time.  Medicine with puree- whole - starting and following with liquids.  Of note, pt did NOT cough during entire MBS - symptoms of  cough with liquids reported prior to admission and during hospitalization.  SLP educated pt and his wife (present for test) to findings/recommendations.  Will follow up for dysphagia management/treatment. Anticipate pt will benefit from dysphagia treatment given his XRT to pharyngeal region. SLP Visit Diagnosis Dysphagia, oropharyngeal phase (R13.12) Attention and concentration deficit following -- Frontal lobe and executive function deficit following -- Impact on safety and function  Mild aspiration risk   CHL IP TREATMENT RECOMMENDATION 01/16/2019 Treatment Recommendations Therapy as outlined in treatment plan below   Prognosis 01/16/2019 Prognosis for Safe Diet Advancement Good Barriers to Reach Goals -- Barriers/Prognosis Comment -- CHL IP DIET RECOMMENDATION 01/16/2019 SLP Diet Recommendations Dysphagia 3 (Mech soft) solids;Thin liquid Liquid Administration via Cup;Straw Medication Administration Whole meds with puree Compensations Small sips/bites;Slow rate;Effortful swallow;Minimize environmental distractions Postural Changes --   CHL IP OTHER RECOMMENDATIONS 01/16/2019 Recommended Consults -- Oral Care Recommendations Oral care BID Other Recommendations Clarify dietary restrictions   CHL IP FOLLOW UP RECOMMENDATIONS 01/16/2019 Follow up Recommendations Skilled Nursing facility   [facility] IP FREQUENCY AND DURATION 01/16/2019 Speech Therapy Frequency (ACUTE ONLY) min 2x/week Treatment Duration 2 weeks      CHL IP ORAL PHASE 01/16/2019 Oral Phase Impaired Oral - Pudding Teaspoon -- Oral - Pudding Cup -- Oral - Honey Teaspoon -- Oral - Honey Cup -- Oral - Nectar Teaspoon -- Oral - Nectar Cup WFL Oral - Nectar Straw -- Oral - Thin Teaspoon -- Oral - Thin Cup WFL Oral - Thin Straw WFL Oral - Puree WFL Oral - Mech Soft WFL Oral - Regular -- Oral - Multi-Consistency -- Oral - Pill Reduced posterior propulsion;Weak lingual manipulation Oral Phase - Comment pt needed pudding to orally transit tablet after unable x3 with liquids  CHL IP PHARYNGEAL PHASE 01/16/2019 Pharyngeal Phase WFL;Impaired Pharyngeal- Pudding Teaspoon -- Pharyngeal -- Pharyngeal- Pudding Cup -- Pharyngeal -- Pharyngeal- Honey Teaspoon -- Pharyngeal -- Pharyngeal- Honey Cup -- Pharyngeal -- Pharyngeal- Nectar Teaspoon -- Pharyngeal -- Pharyngeal- Nectar Cup [facility] Pharyngeal Material does not enter airway Pharyngeal- Nectar Straw -- Pharyngeal -- Pharyngeal- Thin Teaspoon -- Pharyngeal -- Pharyngeal- Thin Cup [facility] Pharyngeal Material does  not enter airway Pharyngeal- Thin Straw WFL Pharyngeal Material does not enter airway Pharyngeal- Puree WFL Pharyngeal Material does not enter airway Pharyngeal- Mechanical Soft Pharyngeal residue - cp segment Pharyngeal Material does not enter airway Pharyngeal- Regular NT Pharyngeal -- Pharyngeal- Multi-consistency -- Pharyngeal -- Pharyngeal- Pill WFL Pharyngeal Material does not enter airway Pharyngeal Comment --  CHL IP CERVICAL ESOPHAGEAL PHASE 01/16/2019 Cervical Esophageal Phase Impaired Pudding Teaspoon -- Pudding Cup -- Honey Teaspoon -- Honey Cup -- Nectar Teaspoon -- Nectar Cup WFL Nectar Straw -- Thin Teaspoon -- Thin Cup WFL Thin Straw Reduced cricopharyngeal relaxation;Esophageal backflow into cervical esophagus Puree WFL Mechanical Soft Reduced cricopharyngeal relaxation;Esophageal backflow into cervical esophagus Regular -- Multi-consistency -- Pill WFL Cervical Esophageal Comment pt without sensation to minimal residuals at pyriform/above UES region, liquid assisted clearance but also resulted in trace backflow, barium tablet swallowed with pudding appeared to transit into sttomach without delay Kathleen Lime, MS [facility] SLP Acute Rehab Services Office [phone] [name] 01/16/2019, 2:19 PM              EEG adult  Result Date: 01/24/2019 Lora Havens, MD     01/24/2019  6:58 PM Patient Name: Jesus [name] MRN: [id number] Epilepsy Attending: Lora Havens Referring Physician/Provider: Dr Jacki Cones Date: 01/24/2019 Duration: 22.09 mins Patient history: 63yo M with ams and jerking.  EEG to evaluate for seizure Level of alertness: awake AEDs during EEG study: Lorazepam Technical aspects: This EEG study was done with scalp electrodes positioned according to the 10-20 International system of electrode placement. Electrical activity was acquired at a sampling rate of _0  and reviewed with a high frequency filter of _1  and a low frequency filter of _2 . EEG data were recorded  continuously and digitally stored. DESCRIPTION: No clear posterior dominant rhythm was seen. EEG showed continuous generalized 3-_3  theta-delta slowing. Hyperventilation and photic stimulation were not performed. ABNORMALITY - Continuous slow, generalized IMPRESSION: This study is suggestive of moderate diffuse encephalopathy, non specific to etiology. No seizures or epileptiform discharges were seen throughout the recording. Priyanka Barbra Sarks   CT BONE MARROW BIOPSY & ASPIRATION  Result Date: 01/16/2019 INDICATION: 63 year old male with a history of multiple myeloma status post stem cell transplant and chemotherapy. He presents with pancytopenia and severe hypercalcemia. He requires bone marrow biopsy to evaluate for progression of disease. EXAM: CT GUIDED BONE MARROW ASPIRATION AND CORE BIOPSY Interventional Radiologist:  Criselda Peaches, MD MEDICATIONS: None. ANESTHESIA/SEDATION: Moderate (conscious) sedation was employed during this procedure. A total of 2 milligrams versed and 100 micrograms fentanyl were administered intravenously. The patient's level of consciousness and vital signs were monitored continuously by radiology nursing throughout the procedure under my direct supervision. Total monitored sedation time: 10 minutes FLUOROSCOPY TIME:  None COMPLICATIONS: None immediate. Estimated blood loss: <25 mL PROCEDURE: Informed written consent was obtained from the patient after a thorough discussion of the procedural risks, benefits and alternatives. All questions were addressed. Maximal Sterile Barrier Technique was utilized including caps, mask, sterile gowns, sterile gloves, sterile drape, hand hygiene and skin antiseptic. A timeout was performed prior to the initiation of the procedure. The patient was positioned prone and non-contrast localization CT was performed of the pelvis to demonstrate the iliac marrow spaces. Maximal barrier sterile technique utilized including caps, mask, sterile gowns,  sterile gloves, large sterile drape, hand hygiene, and betadine prep. Under sterile conditions and local anesthesia, an 11 gauge coaxial bone biopsy needle was advanced into the right iliac marrow space. Needle position was confirmed with CT imaging. Initially, bone marrow aspiration was performed. Next, the 11 gauge outer cannula was utilized to obtain a right iliac bone marrow core biopsy. Needle was removed. Hemostasis was obtained with compression. The patient tolerated the procedure well. Samples were prepared with the cytotechnologist. IMPRESSION: Technically successful CT-guided bone marrow biopsy and aspiration. Signed, Criselda Peaches, MD, [facility] Radiology Electronically Signed   By: Jacqulynn Cadet M.D.   On: 01/16/2019 10:58    ASSESSMENT AND PLAN: 1.  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  Persistent pancytopenia 3.  Severe hypercalcemia 4.  Severe protein calorie malnutrition  -CBC from this morning has been reviewed.  His white blood cell count and hemoglobin are stable.  His platelet count is slowly drifting downward.  Recommend continued supportive transfusions for platelet count less than 10,000 or active bleeding or hemoglobin less than 7.5.  May consider G-CSF support if the patient develops any signs of infection with his neutropenia. -His hypercalcemia is due to underlying multiple myeloma.  Has not normalized despite multiple doses of Zometa, calcitonin, dexamethasone, IV fluids, and Lasix.  Doubt calcium will correct until his multiple myeloma is treated.  However, due to his poor performance status, he is not currently candidate for treatment of his multiple myeloma.  This is previously been discussed  with the patient and his wife and I again attempted to discuss this with the patient today.  He did not really engage in this conversation this morning.  I would recommend reaching back out to the palliative  care team to assist [place] with readdressing goals of care.   LOS: 18 days   Mikey Bussing, DNP, AGPCNP-BC, AOCNP 01/30/19

## 2019-01-31 LAB — COMPREHENSIVE METABOLIC PANEL
ALT: 83 U/L — ABNORMAL HIGH (ref 0–44)
AST: 48 U/L — ABNORMAL HIGH (ref 15–41)
Albumin: 3 g/dL — ABNORMAL LOW (ref 3.5–5.0)
Alkaline Phosphatase: 445 U/L — ABNORMAL HIGH (ref 38–126)
Anion gap: 10 (ref 5–15)
BUN: 14 mg/dL (ref 8–23)
CO2: 24 mmol/L (ref 22–32)
Calcium: 12.2 mg/dL — ABNORMAL HIGH (ref 8.9–10.3)
Chloride: 97 mmol/L — ABNORMAL LOW (ref 98–111)
Creatinine, Ser: 0.6 mg/dL — ABNORMAL LOW (ref 0.61–1.24)
GFR calc Af Amer: >60 mL/min (ref >60)
GFR calc non Af Amer: >60 mL/min (ref >60)
Glucose, Bld: 112 mg/dL — ABNORMAL HIGH (ref 70–99)
Potassium: 2.9 mmol/L — ABNORMAL LOW (ref 3.5–5.1)
Sodium: 131 mmol/L — ABNORMAL LOW (ref 135–145)
Total Bilirubin: 0.7 mg/dL (ref 0.3–1.2)
Total Protein: 7.8 g/dL (ref 6.5–8.1)

## 2019-01-31 LAB — CBC
HCT: 31.1 % — ABNORMAL LOW (ref 39.0–52.0)
Hemoglobin: 10.3 g/dL — ABNORMAL LOW (ref 13.0–17.0)
MCH: 28.9 pg (ref 26.0–34.0)
MCHC: 33.1 g/dL (ref 30.0–36.0)
MCV: 87.1 fL (ref 80.0–100.0)
Platelets: 30 10*3/uL — ABNORMAL LOW (ref 150–400)
RBC: 3.57 MIL/uL — ABNORMAL LOW (ref 4.22–5.81)
RDW: 17.8 % — ABNORMAL HIGH (ref 11.5–15.5)
WBC: 3.1 10*3/uL — ABNORMAL LOW (ref 4.0–10.5)
nRBC: 0 % (ref 0.0–0.2)

## 2019-01-31 LAB — RENAL FUNCTION PANEL
Albumin: 2.9 g/dL — ABNORMAL LOW (ref 3.5–5.0)
Anion gap: 10 (ref 5–15)
BUN: 14 mg/dL (ref 8–23)
CO2: 24 mmol/L (ref 22–32)
Calcium: 12.3 mg/dL — ABNORMAL HIGH (ref 8.9–10.3)
Chloride: 96 mmol/L — ABNORMAL LOW (ref 98–111)
Creatinine, Ser: 0.57 mg/dL — ABNORMAL LOW (ref 0.61–1.24)
GFR calc Af Amer: >60 mL/min (ref >60)
GFR calc non Af Amer: >60 mL/min (ref >60)
Glucose, Bld: 115 mg/dL — ABNORMAL HIGH (ref 70–99)
Phosphorus: 2.5 mg/dL (ref 2.5–4.6)
Potassium: 2.9 mmol/L — ABNORMAL LOW (ref 3.5–5.1)
Sodium: 130 mmol/L — ABNORMAL LOW (ref 135–145)

## 2019-01-31 MED ORDER — POTASSIUM CHLORIDE CRYS ER 20 MEQ PO TBCR
40.0000 meq | EXTENDED_RELEASE_TABLET | Freq: Once | ORAL | Status: AC
  Administered 2019-01-31: 40 meq via ORAL
  Filled 2019-01-31: qty 2

## 2019-01-31 MED ORDER — POTASSIUM CHLORIDE 20 MEQ/15ML (10%) PO SOLN
30.0000 meq | Freq: Once | ORAL | Status: AC
  Administered 2019-01-31: 30 meq via ORAL
  Filled 2019-01-31: qty 30

## 2019-01-31 MED ORDER — POTASSIUM CHLORIDE 10 MEQ/100ML IV SOLN
10.0000 meq | INTRAVENOUS | Status: AC
  Administered 2019-01-31 (×6): 10 meq via INTRAVENOUS
  Filled 2019-01-31 (×6): qty 100

## 2019-01-31 MED ORDER — FUROSEMIDE 10 MG/ML IJ SOLN
40.0000 mg | Freq: Once | INTRAMUSCULAR | Status: AC
  Administered 2019-01-31: 40 mg via INTRAVENOUS
  Filled 2019-01-31: qty 4

## 2019-01-31 MED ORDER — SODIUM CHLORIDE 0.9 % IV BOLUS
1000.0000 mL | Freq: Once | INTRAVENOUS | Status: AC
  Administered 2019-01-31: 1000 mL via INTRAVENOUS

## 2019-01-31 MED ORDER — MAGNESIUM SULFATE 2 GM/50ML IV SOLN
2.0000 g | Freq: Once | INTRAVENOUS | Status: AC
  Administered 2019-01-31: 07:00:00 2 g via INTRAVENOUS
  Filled 2019-01-31: qty 50

## 2019-01-31 NOTE — Progress Notes (Signed)
PROGRESS NOTE  Jesus [name] OXB:[id number] DOB: Nov 25, 1955 DOA: 01/11/2019 PCP: Patient, No Pcp Per   LOS: 19 days   Brief Narrative / Interim history: 63 year old male with multiple myeloma, arthritis, cervical stenosis, who was admitted to the hospital on 01/11/2019 with nosebleed.  He was found to have severe thrombocytopenia as well as anemia on admission and was transfused platelets.  Heme-onc has been consulted.  He was also have found profound hypercalcemia secondary to myeloma and nephrology has been consulted.  Prolonged hospital course due to persistent hypercalcemia, pancytopenia  Subjective / 24h Interval events: No significant complaints this morning.  Assessment & Plan: Principal Problem Hypercalcemia of malignancy -Likely in the setting of multiple myeloma, status post multiple doses of Zometa, calcitonin -Nephrology following, most recent Zometa dose was 12/26 -Calcium is refractory to treatment, increased back up again to 12.9.  It is only a matter of days before it could potentially cause encephalopathy  -Repeat fluids and Lasix today. -Also seen in the room several fruit juices boxes and he tells me he drinks 4-6 at least per day.  They are not high in calcium however larger quantities I wonder whether that can contribute.  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  Platelets trending down, will likely need transfusion again in 1 to 2 days -Oncology consulted -Underwent a bone marrow biopsy on 01/16/2019 which showed persistent involvement by plasma cell myeloma as well as mild myeloid dysplasia. -Bone scan showed uptake over the right acetabulum mid to lower T-spine (ribs and likely cervicothoracic junction.  Also showed recent C3 compression fractures status post corpectomy with C2-C4 plating  Acute kidney  injury -Resolved  Hypokalemia, hypomagnesemia, hypophosphatemia -Potassium again decreased to 2.9, supplement  Epistaxis -On admission, resolved, no further bleeding  Severe protein calorie malnutrition -Dietary following  Acute metabolic encephalopathy -Likely multifactorial, improved and remains alert and oriented, appropriate.  EEG showed generalized encephalopathy without evidence of epileptiform activities, CT head without acute changes   Scheduled Meds: . Chlorhexidine Gluconate Cloth  6 each Topical Daily  . dronabinol  10 mg Oral BID  . feeding supplement  1 Container Oral TID BM  . feeding supplement (PRO-STAT SUGAR FREE 64)  30 mL Oral BID  . oxymetazoline  1 spray Each Nare BID  . sucralfate  1 g Oral TID AC & HS   Continuous Infusions: . potassium chloride 10 mEq (01/31/19 1150)   PRN Meds:.acetaminophen, LORazepam, methocarbamol, ondansetron, oxyCODONE, senna-docusate, sodium chloride flush, witch hazel-glycerin  DVT prophylaxis: SCDs Code Status: Full code Family Communication: Discussed with patient Disposition Plan: To be determined  Consultants:  Oncology  Nephrology   Procedures: BM biopsy   Microbiology  SARS-CoV-2 12/20-negative  Antimicrobials: None     Objective: Vitals:   01/30/19 2030 01/31/19 0443 01/31/19 0446 01/31/19 1029  BP: 133/70 127/74    Pulse: 97 (!) 101    Resp: 16 17    Temp: 97.8 F (36.6 C) 98.5 F (36.9 C)    TempSrc: Oral Oral    SpO2: 100% 99%    Weight:   45.6 kg 46 kg  Height:    _0  (1.676 m)    Intake/Output Summary (Last 24 hours) at 01/31/2019 1217 Last data filed at 01/31/2019 1030 Gross per 24 hour  Intake 1512.7 ml  Output 4900 ml  Net -3387.3 ml   Filed Weights   01/30/19  0600 01/31/19 0446 01/31/19 1029  Weight: 51.2 kg 45.6 kg 46 kg    Examination:  Constitutional: NAD Eyes: No icterus ENMT: Moist mucous membranes Neck: normal, supple  Respiratory: Clear to auscultation bilaterally,  no wheezing, no crackles Cardiovascular: Regular rate and rhythm, no edema Abdomen: Soft, NT, ND, positive bowel sounds Skin: No rash seen Neurologic: No focal deficits Psychiatric: Normal judgment and insight. Alert and oriented x 3. Normal mood.    Data Reviewed: I have independently reviewed following labs and imaging studies   CBC: Recent Labs  Lab 01/27/19 0954 01/28/19 0502 01/29/19 0440 01/30/19 0304 01/31/19 0434  WBC 2.4* 2.6* 1.9* 1.9* 3.1*  NEUTROABS 1.2* 1.2* 0.8*  --   --   HGB 11.3* 11.2* 9.6* 10.1* 10.3*  HCT 33.9* 34.3* 29.5* 30.6* 31.1*  MCV 88.1 88.6 88.9 88.4 87.1  PLT PLATELET CLUMPS NOTED ON SMEAR, UNABLE TO ESTIMATE 83* 53* 40* 30*   Basic Metabolic Panel: Recent Labs  Lab 01/25/19 0438 01/26/19 0433 01/27/19 0422 01/28/19 0502 01/28/19 1450 01/28/19 1458 01/29/19 0440 01/30/19 0304 01/31/19 0434  NA 138 134* 132* 133* 130*  --  134* 135 130*  131*  K 4.0 2.8* 3.4* 2.9* 3.7  --  3.7 3.6 2.9*  2.9*  CL 107 99 100 104 104  --  103 105 96*  97*  CO2 _0 21* 19*  --  20* _1 GLUCOSE 93 111* 126* 136* 130*  --  100* 100* 115*  112*  BUN _2 --  _3 CREATININE 0.64 0.73 0.60* 0.57* 0.62  --  0.55* 0.53* 0.57*  0.60*  CALCIUM 11.4* 12.1* 11.9* 12.0* 11.0*  --  10.5* 11.9* 12.3*  12.2*  MG 1.9 1.7 1.7  --   --  1.3*  --  1.8  --   PHOS 3.6 3.8 2.2* 1.8*  --   --  1.9*  1.9* 2.2* 2.5   Liver Function Tests: Recent Labs  Lab 01/27/19 0422 01/28/19 0502 01/29/19 0440 01/30/19 0304 01/31/19 0434  AST  --   --   --  52* 48*  ALT  --   --   --  80* 83*  ALKPHOS  --   --   --  426* 445*  BILITOT  --   --   --  0.4 0.7  PROT  --   --   --  7.2 7.8  ALBUMIN 3.2* 3.1* 2.7* 2.8* 2.9*  3.0*   Coagulation Profile: No results for input(s): INR, PROTIME in the last 168 hours. HbA1C: No results for input(s): HGBA1C in the last 72 hours. CBG: No results for input(s): GLUCAP in the last 168 hours.  No  results found for this or any previous visit (from the past 240 hour(s)).   Radiology Studies: No results found.  Marzetta Board, MD, PhD [facility]  Between 7 am - 7 pm I am available, please contact me via Amion or Securechat  Between 7 pm - 7 am I am not available, please contact night coverage MD/APP via Amion

## 2019-01-31 NOTE — Progress Notes (Signed)
Occupational Therapy Treatment Patient Details Name: Jesus [name] MRN: [id number] DOB: 08-Nov-1955 Today's Date: 01/31/2019    History of present illness Pt is 63 year old male with history of multiple myeloma, arthritis, cervical stenosis who presented with nosebleed.  Pt was found to have severe thrombocytopenia, normocytic anemia, and hypercalcemia secondary to multiple myeloma.  Today platelets are 17 (nurse report pt received platelets since this reading) and Calcium is 13   OT comments  Pt making progress with functional goals. Using RW min guard A for ADL mobility to bathroom and standing at sink. Pt pleasant, cooperative and wife very supportive. OT will continue to follow acutely  Follow Up Recommendations  Home health OT;Supervision/Assistance - 24 hour    Equipment Recommendations  3 in 1 bedside commode;Other (comment)(reacher)    Recommendations for Other Services      Precautions / Restrictions Precautions Precautions: Fall Restrictions Weight Bearing Restrictions: No       Mobility Bed Mobility               General bed mobility comments: pt in recliner upon arrival  Transfers Overall transfer level: Needs assistance Equipment used: Rolling walker (2 wheeled) Transfers: Sit to/from [facility] Sit to Stand: Min guard Stand pivot transfers: Min guard       General transfer comment: cues for hand placement,  min/guard for safetywalker    Balance Overall balance assessment: Needs assistance Sitting-balance support: Feet supported;No upper extremity supported Sitting balance-Leahy Scale: Fair     Standing balance support: No upper extremity supported;Bilateral upper extremity supported Standing balance-Leahy Scale: Fair                             ADL either performed or assessed with clinical judgement   ADL Overall ADL's : Needs assistance/impaired     Grooming: Wash/dry hands;Wash/dry  face;Supervision/safety;Standing               Lower Body Dressing: Sit to/from stand;Minimal assistance;With caregiver independent assisting   Toilet Transfer: Min guard;Ambulation;RW;Stand-pivot;Regular Toilet;Grab bars   Toileting- Clothing Manipulation and Hygiene: Min guard;Sit to/from stand       Functional mobility during ADLs: Min guard;Rolling walker       Vision Baseline Vision/History: Wears glasses Wears Glasses: At all times Patient Visual Report: No change from baseline     Perception     Praxis      Cognition Arousal/Alertness: Awake/alert Behavior During Therapy: Flat affect Overall Cognitive Status: Within Functional Limits for tasks assessed                                          Exercises     Shoulder Instructions       General Comments      Pertinent Vitals/ Pain       Pain Assessment: No/denies pain Pain Score: 0-No pain Pain Intervention(s): Monitored during session  Home Living                                          Prior Functioning/Environment              Frequency  Min 2X/week        Progress Toward Goals  OT Goals(current goals can now be found  in the care plan section)  Progress towards OT goals: Progressing toward goals  Acute Rehab OT Goals Patient Stated Goal: return home  Plan Discharge plan remains appropriate    Co-evaluation                 AM-PAC OT "6 Clicks" Daily Activity     Outcome Measure   Help from another person eating meals?: None Help from another person taking care of personal grooming?: A Little Help from another person toileting, which includes using toliet, bedpan, or urinal?: A Little Help from another person bathing (including washing, rinsing, drying)?: A Little Help from another person to put on and taking off regular upper body clothing?: A Little Help from another person to put on and taking off regular lower body clothing?: A  Little 6 Click Score: 19    End of Session Equipment Utilized During Treatment: Gait belt;Rolling walker  OT Visit Diagnosis: Unsteadiness on feet (R26.81);Muscle weakness (generalized) (M62.81)   Activity Tolerance Patient limited by fatigue   Patient Left in chair;with family/visitor present;with call bell/phone within reach   Nurse Communication          Time: 0223-3612 OT Time Calculation (min): 27 min  Charges: OT General Charges $OT Visit: 1 Visit OT Treatments $Self Care/Home Management : 8-22 mins $Therapeutic Activity: 8-22 mins    Britt Bottom 01/31/2019, 1:15 PM

## 2019-02-01 LAB — BASIC METABOLIC PANEL
Anion gap: 6 (ref 5–15)
BUN: 11 mg/dL (ref 8–23)
CO2: 24 mmol/L (ref 22–32)
Calcium: 11.8 mg/dL — ABNORMAL HIGH (ref 8.9–10.3)
Chloride: 99 mmol/L (ref 98–111)
Creatinine, Ser: 0.52 mg/dL — ABNORMAL LOW (ref 0.61–1.24)
GFR calc Af Amer: >60 mL/min (ref >60)
GFR calc non Af Amer: >60 mL/min (ref >60)
Glucose, Bld: 115 mg/dL — ABNORMAL HIGH (ref 70–99)
Potassium: 3.7 mmol/L (ref 3.5–5.1)
Sodium: 129 mmol/L — ABNORMAL LOW (ref 135–145)

## 2019-02-01 LAB — RENAL FUNCTION PANEL
Albumin: 3 g/dL — ABNORMAL LOW (ref 3.5–5.0)
Anion gap: 7 (ref 5–15)
BUN: 12 mg/dL (ref 8–23)
CO2: 23 mmol/L (ref 22–32)
Calcium: 11.9 mg/dL — ABNORMAL HIGH (ref 8.9–10.3)
Chloride: 98 mmol/L (ref 98–111)
Creatinine, Ser: 0.64 mg/dL (ref 0.61–1.24)
GFR calc Af Amer: >60 mL/min (ref >60)
GFR calc non Af Amer: >60 mL/min (ref >60)
Glucose, Bld: 112 mg/dL — ABNORMAL HIGH (ref 70–99)
Phosphorus: 2.3 mg/dL — ABNORMAL LOW (ref 2.5–4.6)
Potassium: 3.7 mmol/L (ref 3.5–5.1)
Sodium: 128 mmol/L — ABNORMAL LOW (ref 135–145)

## 2019-02-01 LAB — CBC
HCT: 30.3 % — ABNORMAL LOW (ref 39.0–52.0)
HCT: 27.3 % — ABNORMAL LOW (ref 39.0–52.0)
Hemoglobin: 10 g/dL — ABNORMAL LOW (ref 13.0–17.0)
Hemoglobin: 9.1 g/dL — ABNORMAL LOW (ref 13.0–17.0)
MCH: 29.2 pg (ref 26.0–34.0)
MCH: 29.6 pg (ref 26.0–34.0)
MCHC: 33 g/dL (ref 30.0–36.0)
MCHC: 33.3 g/dL (ref 30.0–36.0)
MCV: 88.3 fL (ref 80.0–100.0)
MCV: 88.9 fL (ref 80.0–100.0)
Platelets: 23 10*3/uL — CL (ref 150–400)
Platelets: 115 10*3/uL — ABNORMAL LOW (ref 150–400)
RBC: 3.43 MIL/uL — ABNORMAL LOW (ref 4.22–5.81)
RBC: 3.07 MIL/uL — ABNORMAL LOW (ref 4.22–5.81)
RDW: 18.2 % — ABNORMAL HIGH (ref 11.5–15.5)
RDW: 18.1 % — ABNORMAL HIGH (ref 11.5–15.5)
WBC: 2.6 10*3/uL — ABNORMAL LOW (ref 4.0–10.5)
WBC: 2.7 10*3/uL — ABNORMAL LOW (ref 4.0–10.5)
nRBC: 0 % (ref 0.0–0.2)
nRBC: 0 % (ref 0.0–0.2)

## 2019-02-01 LAB — TYPE AND SCREEN
ABO/RH(D): A POS
Antibody Screen: POSITIVE
DAT, IgG: NEGATIVE

## 2019-02-01 MED ORDER — SODIUM CHLORIDE 0.9% IV SOLUTION: Freq: Once | INTRAVENOUS | Status: AC

## 2019-02-01 NOTE — Progress Notes (Signed)
PROGRESS NOTE  TAJON MORING EPP:[id number] DOB: 1955-10-18 DOA: 01/11/2019 PCP: Patient, No Pcp Per   LOS: 20 days   Brief Narrative / Interim history: 63 year old male with multiple myeloma, arthritis, cervical stenosis, who was admitted to the hospital on 01/11/2019 with nosebleed.  He was found to have severe thrombocytopenia as well as anemia on admission and was transfused platelets.  Heme-onc has been consulted.  He was also have found profound hypercalcemia secondary to myeloma and nephrology has been consulted.  Prolonged hospital course due to persistent hypercalcemia, pancytopenia.  Oncology, nephrology and palliative care have all been consulted  Subjective / 24h Interval events: No complaints, states that he is doing well, no chest pain, shortness of breath, no abdominal pain, nausea or vomiting  Assessment & Plan: Principal Problem Hypercalcemia of malignancy -Likely in the setting of multiple myeloma, status post 3 doses of Zometa, calcitonin -Nephrology following, most recent Zometa dose was 12/26 but eventually signed off as they had nothing more to offer -Calcium is refractory to treatment and there are no further valid options to bring it down short of treating the myeloma.  Continue to watch -Also seen in the room several fruit juices boxes and he tells me he drinks 4-6 at least per day.  They are not high in calcium however larger quantities I wonder whether that can contribute.  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  Platelets are again trending down and will transfuse again today -Oncology consulted, for the treatment is somewhat unclear as chemotherapy depends on his functional status.  He was quite deconditioned and weak and confused when he arrived to the hospital but now improved significantly and oncology input is  appreciated -Underwent a bone marrow biopsy on 01/16/2019 which showed persistent involvement by plasma cell myeloma as well as mild myeloid dysplasia. -Bone scan showed uptake over the right acetabulum mid to lower T-spine (ribs and likely cervicothoracic junction.  Also showed recent C3 compression fractures status post corpectomy with C2-C4 plating  Acute kidney injury -Resolved  Hypokalemia, hypomagnesemia, hypophosphatemia -Potassium slightly better today, continue to monitor  Epistaxis -On admission, resolved, no further bleeding  Severe protein calorie malnutrition -Dietary following  Acute metabolic encephalopathy -Likely multifactorial, improved and remains alert and oriented, appropriate.  EEG showed generalized encephalopathy without evidence of epileptiform activities, CT head without acute changes  -Resolved  Scheduled Meds: . sodium chloride   Intravenous Once  . Chlorhexidine Gluconate Cloth  6 each Topical Daily  . dronabinol  10 mg Oral BID  . feeding supplement  1 Container Oral TID BM  . feeding supplement (PRO-STAT SUGAR FREE 64)  30 mL Oral BID  . oxymetazoline  1 spray Each Nare BID  . sucralfate  1 g Oral TID AC & HS   Continuous Infusions:  PRN Meds:.acetaminophen, LORazepam, methocarbamol, ondansetron, oxyCODONE, senna-docusate, sodium chloride flush, witch hazel-glycerin  DVT prophylaxis: SCDs Code Status: Full code Family Communication: Discussed with patient Disposition Plan: To be determined  Consultants:  Oncology  Nephrology   Procedures: BM biopsy   Microbiology  SARS-CoV-2 12/20-negative  Antimicrobials: None     Objective: Vitals:   01/31/19 1029 01/31/19 1449 01/31/19 2023 02/01/19 0421  BP:  131/79 126/73 123/77  Pulse:  (!) 102 (!) 103 (!) 106  Resp:  '18 17 19  ' Temp:  98.1 F (36.7 C) 98.5 F (36.9 C) 98.4 F (  36.9 C)  TempSrc:  Oral    SpO2:  99% 98% 100%  Weight: 46 kg     Height: '5\' 6"'  (1.676 m)        Intake/Output Summary (Last 24 hours) at 02/01/2019 1124 Last data filed at 02/01/2019 1004 Gross per 24 hour  Intake 1583.86 ml  Output 4750 ml  Net -3166.14 ml   Filed Weights   01/30/19 0600 01/31/19 0446 01/31/19 1029  Weight: 51.2 kg 45.6 kg 46 kg    Examination:  Constitutional: No distress, cachectic appearing Eyes: No scleral icterus ENMT: Moist mucous membranes Neck: normal, supple  Respiratory: Clear to auscultation bilaterally, no wheezing, no crackles Cardiovascular: Regular rate and rhythm, no peripheral edema Abdomen: Soft, nontender, nondistended, positive bowel sounds Skin: No rash seen Neurologic: No focal deficits Psychiatric: Normal judgment and insight. Alert and oriented x 3. Normal mood.    Data Reviewed: I have independently reviewed following labs and imaging studies   CBC: Recent Labs  Lab 01/27/19 0954 01/28/19 0502 01/29/19 0440 01/30/19 0304 01/31/19 0434 02/01/19 0326  WBC 2.4* 2.6* 1.9* 1.9* 3.1* 2.6*  NEUTROABS 1.2* 1.2* 0.8*  --   --   --   HGB 11.3* 11.2* 9.6* 10.1* 10.3* 10.0*  HCT 33.9* 34.3* 29.5* 30.6* 31.1* 30.3*  MCV 88.1 88.6 88.9 88.4 87.1 88.3  PLT PLATELET CLUMPS NOTED ON SMEAR, UNABLE TO ESTIMATE 83* 53* 40* 30* 23*   Basic Metabolic Panel: Recent Labs  Lab 01/26/19 0433 01/27/19 0422 01/28/19 0502 01/28/19 1450 01/28/19 1458 01/29/19 0440 01/30/19 0304 01/31/19 0434 02/01/19 0326  NA 134* 132* 133* 130*  --  134* 135 130*  131* 128*  129*  K 2.8* 3.4* 2.9* 3.7  --  3.7 3.6 2.9*  2.9* 3.7  3.7  CL 99 100 104 104  --  103 105 96*  97* 98  99  CO2 27 23 21* 19*  --  20* '23 24  24 23  24  ' GLUCOSE 111* 126* 136* 130*  --  100* 100* 115*  112* 112*  115*  BUN '13 12 10 14  ' --  '12 12 14  14 12  11  ' CREATININE 0.73 0.60* 0.57* 0.62  --  0.55* 0.53* 0.57*  0.60* 0.64  0.52*  CALCIUM 12.1* 11.9* 12.0* 11.0*  --  10.5* 11.9* 12.3*  12.2* 11.9*  11.8*  MG 1.7 1.7  --   --  1.3*  --  1.8  --   --    PHOS 3.8 2.2* 1.8*  --   --  1.9*  1.9* 2.2* 2.5 2.3*   Liver Function Tests: Recent Labs  Lab 01/28/19 0502 01/29/19 0440 01/30/19 0304 01/31/19 0434 02/01/19 0326  AST  --   --  52* 48*  --   ALT  --   --  80* 83*  --   ALKPHOS  --   --  426* 445*  --   BILITOT  --   --  0.4 0.7  --   PROT  --   --  7.2 7.8  --   ALBUMIN 3.1* 2.7* 2.8* 2.9*  3.0* 3.0*   Coagulation Profile: No results for input(s): INR, PROTIME in the last 168 hours. HbA1C: No results for input(s): HGBA1C in the last 72 hours. CBG: No results for input(s): GLUCAP in the last 168 hours.  No results found for this or any previous visit (from the past 240 hour(s)).   Radiology Studies: No results found.  Conda Wannamaker  Cruzita Lederer, MD, PhD [facility]  Between 7 am - 7 pm I am available, please contact me via Amion or Securechat  Between 7 pm - 7 am I am not available, please contact night coverage MD/APP via Amion

## 2019-02-01 NOTE — Progress Notes (Signed)
   Vital Signs MEWS/VS Documentation      02/01/2019 1451 02/01/2019 1519 02/01/2019 1631 02/01/2019 1737   MEWS Score:  1  2  2  1    MEWS Score Color:  Green  Yellow  Yellow  Green   Resp:  19  19  --  18   Pulse:  (!) 109  (!) 111  --  (!) 105   BP:  116/71  114/72  --  122/75   Temp:  98.7 F (37.1 C)  99.4 F (37.4 C)  --  99.4 F (37.4 C)   O2 Device:  Room Air  Room Air  --  Room Air   Level of Consciousness:  --  --  Alert  --      Pt HR went up to 112. Trends sinus tach. Reassessment done, pt asymptomatic. MD notified at 1630. No new orders placed. RN will continue to monitor pt.     Adrijana Haros D Akili Corsetti 02/01/2019,5:58 PM

## 2019-02-01 NOTE — Progress Notes (Signed)
Nutrition Follow-up  DOCUMENTATION CODES:   Underweight  INTERVENTION:   -Boost Breeze po TID, each supplement provides 250 kcal and 9 grams of protein -Prostat liquid protein PO 30 ml BID with meals, each supplement provides 100 kcal, 15 grams protein.  NUTRITION DIAGNOSIS:   Increased nutrient needs related to cancer and cancer related treatments as evidenced by estimated needs.  Ongoing.  GOAL:   Patient will meet greater than or equal to 90% of their needs  Progressing.  MONITOR:   PO intake, Supplement acceptance, Labs, Weight trends, I & O's  ASSESSMENT:   63 year old male with history of multiple myeloma, arthritis, cervical stenosis who presented with nosebleed.  He had a similar episode about 2 weeks ago which lasted for 8 hours but spontaneously resolved.  He follows with oncologist at [facility] to have severe thrombocytopenia and presentation and also has normocytic anemia. Admitted for hypercalcemia.  **RD working remotely**  Pt currently consuming 75-100% of meals, appetite has improved. Pt is drinking Boost Breeze and Prostat supplements today.   Admission weight: 108 lbs. Current weight: 101 lbs.  Medications: Marinol capsule, Carafate tablet  Labs reviewed: Low Na, Phos Corrected Ca: 12.6 -trended down from 12/29  Diet Order:   Diet Order            DIET DYS 3 Room service appropriate? Yes with Assist; Fluid consistency: Thin  Diet effective now              EDUCATION NEEDS:   Not appropriate for education at this time  Skin:  Skin Assessment: Reviewed RN Assessment  Last BM:  12/29  Height:   Ht Readings from Last 1 Encounters:  01/31/19 '5\' 6"'  (1.676 m)    Weight:   Wt Readings from Last 1 Encounters:  01/31/19 46 kg    Ideal Body Weight:  67.2 kg  BMI:  Body mass index is 16.38 kg/m.  Estimated Nutritional Needs:   Kcal:  1550-1750  Protein:  75-85g  Fluid:  1.8L/day  Clayton Bibles, MS, RD, LDN Inpatient  Clinical Dietitian Pager: [phone] After Hours Pager: [phone]

## 2019-02-01 NOTE — Progress Notes (Signed)
Writer notified oncoming nurse of new orders (tranfuse 2 units of platelets). Still awaiting type and screen. Norlene Duel RN, BSN

## 2019-02-01 NOTE — Progress Notes (Addendum)
CRITICAL VALUE STICKER  CRITICAL VALUE: Platelets 23  RECEIVER (on-site recipient of call): Norlene Duel   DATE & TIME NOTIFIED: 02/01/2019 @ [place] (representative from lab): Alexa  MD NOTIFIED: M. [facility] FNP  TIME OF NOTIFICATION: 02/01/2019 @ 0513  RESPONSE: New orders obtained, transfuse two units of platelets    0536- spoke with lab, patients type and screen are expired and they will be up to redraw. Norlene Duel RN, BSN

## 2019-02-01 NOTE — Progress Notes (Signed)
Pt HR runs sinus tach. [place] protocol initiated. RN will continue to monitor.

## 2019-02-01 NOTE — Progress Notes (Signed)
PT Cancellation Note  Patient Details Name: Jesus [name] MRN: [id number] DOB: [birth year]   Cancelled Treatment:    Reason Eval/Treat Not Completed: Attempted PT tx session-pt declined participation on today. He requested PT to check back tomorrow. Will check back as schedule allows.     Doreatha Massed, PT Acute Rehabilitation

## 2019-02-02 LAB — PREPARE PLATELET PHERESIS
Unit division: 0
Unit division: 0

## 2019-02-02 LAB — CBC
HCT: 29.1 % — ABNORMAL LOW (ref 39.0–52.0)
Hemoglobin: 9.7 g/dL — ABNORMAL LOW (ref 13.0–17.0)
MCH: 29.7 pg (ref 26.0–34.0)
MCHC: 33.3 g/dL (ref 30.0–36.0)
MCV: 89 fL (ref 80.0–100.0)
Platelets: 119 10*3/uL — ABNORMAL LOW (ref 150–400)
RBC: 3.27 MIL/uL — ABNORMAL LOW (ref 4.22–5.81)
RDW: 18.2 % — ABNORMAL HIGH (ref 11.5–15.5)
WBC: 2.5 10*3/uL — ABNORMAL LOW (ref 4.0–10.5)
nRBC: 0 % (ref 0.0–0.2)

## 2019-02-02 LAB — COMPREHENSIVE METABOLIC PANEL
ALT: 95 U/L — ABNORMAL HIGH (ref 0–44)
AST: 55 U/L — ABNORMAL HIGH (ref 15–41)
Albumin: 3.1 g/dL — ABNORMAL LOW (ref 3.5–5.0)
Alkaline Phosphatase: 417 U/L — ABNORMAL HIGH (ref 38–126)
Anion gap: 8 (ref 5–15)
BUN: 11 mg/dL (ref 8–23)
CO2: 25 mmol/L (ref 22–32)
Calcium: 12.3 mg/dL — ABNORMAL HIGH (ref 8.9–10.3)
Chloride: 99 mmol/L (ref 98–111)
Creatinine, Ser: 0.44 mg/dL — ABNORMAL LOW (ref 0.61–1.24)
GFR calc Af Amer: >60 mL/min (ref >60)
GFR calc non Af Amer: >60 mL/min (ref >60)
Glucose, Bld: 104 mg/dL — ABNORMAL HIGH (ref 70–99)
Potassium: 3.2 mmol/L — ABNORMAL LOW (ref 3.5–5.1)
Sodium: 132 mmol/L — ABNORMAL LOW (ref 135–145)
Total Bilirubin: 0.7 mg/dL (ref 0.3–1.2)
Total Protein: 7.7 g/dL (ref 6.5–8.1)

## 2019-02-02 LAB — BPAM PLATELET PHERESIS
Blood Product Expiration Date: 202101012359
Blood Product Expiration Date: 202101022359
ISSUE DATE / TIME: 202012301251
ISSUE DATE / TIME: 202012301500
Unit Type and Rh: 2800
Unit Type and Rh: 7300

## 2019-02-02 LAB — RENAL FUNCTION PANEL
Albumin: 3.1 g/dL — ABNORMAL LOW (ref 3.5–5.0)
Anion gap: 9 (ref 5–15)
BUN: 10 mg/dL (ref 8–23)
CO2: 24 mmol/L (ref 22–32)
Calcium: 12.3 mg/dL — ABNORMAL HIGH (ref 8.9–10.3)
Chloride: 98 mmol/L (ref 98–111)
Creatinine, Ser: 0.52 mg/dL — ABNORMAL LOW (ref 0.61–1.24)
GFR calc Af Amer: >60 mL/min (ref >60)
GFR calc non Af Amer: >60 mL/min (ref >60)
Glucose, Bld: 103 mg/dL — ABNORMAL HIGH (ref 70–99)
Phosphorus: 2.6 mg/dL (ref 2.5–4.6)
Potassium: 3.2 mmol/L — ABNORMAL LOW (ref 3.5–5.1)
Sodium: 131 mmol/L — ABNORMAL LOW (ref 135–145)

## 2019-02-02 NOTE — Progress Notes (Signed)
Hydrologist [facility])  Hospital Liaison: RN note       Notified by Kathrin Greathouse, CSW of patient/family request for [facility] Palliative services at home after discharge.       Writer spoke with patient's wfe to confirm interest and explain services.             [place] Palliative team will follow up with patient after discharge.       Please call with any hospice or palliative related questions.       Thank you for this referral.       Farrel Gordon, RN, CCM   [place] (listed on AMION under Hospice and [place] [facility] [place])   [phone]

## 2019-02-02 NOTE — TOC Progression Note (Signed)
Transition of Care [facility]) - Progression Note    Patient Details  Name: Jesus [name] MRN: [id number] Date of Birth: 12-13-1955  Transition of Care [facility]) CM/SW [place], [place] Phone Number: 02/02/2019, 12:10 PM  Clinical Narrative:    CSW made ambulatory palliative care referral to [facility] 12/31 to [facility] Patient aware.    Expected Discharge Plan: [place] Barriers to Discharge: No Barriers Identified  Expected Discharge Plan and Services Expected Discharge Plan: [place] Determinants of Health (SDOH) Interventions    Readmission Risk Interventions Readmission Risk Prevention Plan 11/07/2018  Transportation Screening Complete  PCP or Specialist Appt within 5-7 Days Complete  Home Care Screening Complete  Medication Review (RN CM) Complete

## 2019-02-02 NOTE — Progress Notes (Addendum)
Jesus [name] WLN:[id number] DOB: 1955-07-21 DOA: 01/11/2019 PCP: Patient, No Pcp Per   LOS: 21 days   Brief Narrative / Interim history: 63 year old male with multiple myeloma, arthritis, cervical stenosis, who was admitted to the hospital on 01/11/2019 with nosebleed.  He was found to have severe thrombocytopenia as well as anemia on admission and was transfused platelets.  Heme-onc has been consulted.  He was also have found profound hypercalcemia secondary to myeloma and nephrology has been consulted.  Prolonged hospital course due to persistent hypercalcemia, pancytopenia.  Oncology, nephrology and palliative care have all been consulted  Subjective / 24h Interval events: -No events overnight, calcium remains persistently elevated, -Appetite is very poor, patient had a few bites of lunch yesterday and nothing else all day -Continues to complain of low back pain  Assessment & Plan:  Hypercalcemia of malignancy -Secondary to progressive multiple myeloma, status post 3 doses of Zometa, calcitonin, fluids, even Lasix -Nephrology following, most recent Zometa dose was 12/26 but eventually signed off as they had nothing more to offer -Hypercalcemia remains refractory to above treatment and at this point we feel that he has no further options other than treating his myeloma , fortunately since this is been chronic he does not have acute symptoms at this time  -Follow c-Met daily -Await oncology input from Dr.Kale, team following as well, now DNR, patient wishes to pursue therapy  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 -Bone scan showed uptake over the right acetabulum mid to  lower T-spine (ribs and likely cervicothoracic junction.  Also showed recent C3 compression fractures status post corpectomy with C2-C4 plating -Has poor functional status which limits treatment options  Acute kidney injury -Resolved  Hypokalemia, hypomagnesemia, hypophosphatemia -Replace  Epistaxis -On admission, resolved, no further bleeding  Severe protein calorie malnutrition -Dietary following -Continue supplements as tolerated, unfortunately p.o. intake remains very poor  Acute metabolic encephalopathy -Likely multifactorial, improved and remains alert and oriented, appropriate.  EEG showed generalized encephalopathy without evidence of epileptiform activities, CT head without acute changes  -Resolved  DVT prophylaxis: SCDs Code Status: Full code Family Communication: Discussed with patient, no family at bedside Disposition Plan: To be determined   Scheduled Meds: . Chlorhexidine Gluconate Cloth  6 each Topical Daily  . dronabinol  10 mg Oral BID  . feeding supplement  1 Container Oral TID BM  . feeding supplement (PRO-STAT SUGAR FREE 64)  30 mL Oral BID  . oxymetazoline  1 spray Each Nare BID  . sucralfate  1 g Oral TID AC & HS   Continuous Infusions:  PRN Meds:.acetaminophen, LORazepam, methocarbamol, ondansetron, oxyCODONE, senna-docusate, sodium chloride flush, witch hazel-glycerin  Consultants:  Oncology  Nephrology   Procedures: BM biopsy   Microbiology  SARS-CoV-2 12/20-negative  Antimicrobials: None     Objective: Vitals:   02/02/19 0313 02/02/19 0320 02/02/19 0740 02/02/19 1107  BP: 125/75  126/77 129/79  Pulse: (!) 106  (!) 104 100  Resp: _0 Temp: 97.8 F (36.6 C)  98.2 F (36.8 C) 98.2 F (36.8 C)  TempSrc: Oral   Oral  SpO2: 100%  100% 99%  Weight:  45.5 kg    Height:  Intake/Output Summary (Last 24 hours) at 02/02/2019 1333 Last data filed at 02/02/2019 1233 Gross per 24 hour  Intake 1306.5 ml  Output 2550 ml    Net -1243.5 ml   Filed Weights   01/31/19 0446 01/31/19 1029 02/02/19 0320  Weight: 45.6 kg 46 kg 45.5 kg    Examination:  Gen: Extremely cachectic, chronically ill appearing male, sitting up in bed, AAOx3  HEENT: Oral mucosa is moist  lungs: Decreased breath sounds bases, rest clear CVS: RRR,No Gallops,Rubs or new Murmurs Abd: soft, Non tender, non distended, BS present Extremities: Trace edema Skin: no new rashes Psychiatric: Normal mood   Data Reviewed: I have independently reviewed following labs and imaging studies   CBC: Recent Labs  Lab 01/27/19 0954 01/28/19 0502 01/29/19 0440 01/30/19 0304 01/31/19 0434 02/01/19 0326 02/01/19 1821 02/02/19 0441  WBC 2.4* 2.6* 1.9* 1.9* 3.1* 2.6* 2.7* 2.5*  NEUTROABS 1.2* 1.2* 0.8*  --   --   --   --   --   HGB 11.3* 11.2* 9.6* 10.1* 10.3* 10.0* 9.1* 9.7*  HCT 33.9* 34.3* 29.5* 30.6* 31.1* 30.3* 27.3* 29.1*  MCV 88.1 88.6 88.9 88.4 87.1 88.3 88.9 89.0  PLT PLATELET CLUMPS NOTED ON SMEAR, UNABLE TO ESTIMATE 83* 53* 40* 30* 23* 115* 737*   Basic Metabolic Panel: Recent Labs  Lab 01/27/19 0422 01/28/19 1458 01/29/19 0440 01/30/19 0304 01/31/19 0434 02/01/19 0326 02/02/19 0441  NA 132*  --  134* 135 130*  131* 128*  129* 131*  132*  K 3.4*  --  3.7 3.6 2.9*  2.9* 3.7  3.7 3.2*  3.2*  CL 100  --  103 105 96*  97* 98  99 98  99  CO2 23  --  20* _0 GLUCOSE 126*  --  100* 100* 115*  112* 112*  115* 103*  104*  BUN 12  --  _1 CREATININE 0.60*  --  0.55* 0.53* 0.57*  0.60* 0.64  0.52* 0.52*  0.44*  CALCIUM 11.9*  --  10.5* 11.9* 12.3*  12.2* 11.9*  11.8* 12.3*  12.3*  MG 1.7 1.3*  --  1.8  --   --   --   PHOS 2.2*  --  1.9*  1.9* 2.2* 2.5 2.3* 2.6   Liver Function Tests: Recent Labs  Lab 01/29/19 0440 01/30/19 0304 01/31/19 0434 02/01/19 0326 02/02/19 0441  AST  --  52* 48*  --  55*  ALT  --  80* 83*  --  95*  ALKPHOS  --  426* 445*  --  417*   BILITOT  --  0.4 0.7  --  0.7  PROT  --  7.2 7.8  --  7.7  ALBUMIN 2.7* 2.8* 2.9*  3.0* 3.0* 3.1*  3.1*   Coagulation Profile: No results for input(s): INR, PROTIME in the last 168 hours. HbA1C: No results for input(s): HGBA1C in the last 72 hours. CBG: No results for input(s): GLUCAP in the last 168 hours.   Radiology Studies: No results found.  Domenic Polite MD [facility]

## 2019-02-03 LAB — CBC
HCT: 28.1 % — ABNORMAL LOW (ref 39.0–52.0)
Hemoglobin: 9.3 g/dL — ABNORMAL LOW (ref 13.0–17.0)
MCH: 29.2 pg (ref 26.0–34.0)
MCHC: 33.1 g/dL (ref 30.0–36.0)
MCV: 88.4 fL (ref 80.0–100.0)
Platelets: 81 10*3/uL — ABNORMAL LOW (ref 150–400)
RBC: 3.18 MIL/uL — ABNORMAL LOW (ref 4.22–5.81)
RDW: 18 % — ABNORMAL HIGH (ref 11.5–15.5)
WBC: 2.5 10*3/uL — ABNORMAL LOW (ref 4.0–10.5)
nRBC: 0 % (ref 0.0–0.2)

## 2019-02-03 LAB — RENAL FUNCTION PANEL
Albumin: 3 g/dL — ABNORMAL LOW (ref 3.5–5.0)
Anion gap: 8 (ref 5–15)
BUN: 9 mg/dL (ref 8–23)
CO2: 27 mmol/L (ref 22–32)
Calcium: 13 mg/dL — ABNORMAL HIGH (ref 8.9–10.3)
Chloride: 99 mmol/L (ref 98–111)
Creatinine, Ser: 0.44 mg/dL — ABNORMAL LOW (ref 0.61–1.24)
GFR calc Af Amer: >60 mL/min (ref >60)
GFR calc non Af Amer: >60 mL/min (ref >60)
Glucose, Bld: 105 mg/dL — ABNORMAL HIGH (ref 70–99)
Phosphorus: 2.1 mg/dL — ABNORMAL LOW (ref 2.5–4.6)
Potassium: 3.4 mmol/L — ABNORMAL LOW (ref 3.5–5.1)
Sodium: 134 mmol/L — ABNORMAL LOW (ref 135–145)

## 2019-02-03 MED ORDER — SODIUM CHLORIDE 0.9 % IV SOLN: INTRAVENOUS | Status: AC

## 2019-02-03 MED ORDER — CALCITONIN (SALMON) 200 UNIT/ACT NA SOLN
1.0000 | Freq: Every day | NASAL | Status: DC
  Administered 2019-02-03 – 2019-02-10 (×8): 1 via NASAL
  Filled 2019-02-03: qty 3.7

## 2019-02-03 MED ORDER — FUROSEMIDE 10 MG/ML IJ SOLN
20.0000 mg | Freq: Four times a day (QID) | INTRAMUSCULAR | Status: AC
  Administered 2019-02-03 (×2): 20 mg via INTRAVENOUS
  Filled 2019-02-03 (×2): qty 2

## 2019-02-03 MED ORDER — TRAZODONE HCL 50 MG PO TABS
50.0000 mg | ORAL_TABLET | Freq: Once | ORAL | Status: AC
  Administered 2019-02-03: 22:00:00 50 mg via ORAL
  Filled 2019-02-03: qty 1

## 2019-02-03 NOTE — Progress Notes (Signed)
PT Cancellation Note  Patient Details Name: LAWRNCE HEYES MRN: [id number] DOB: 10-Jul-1955   Cancelled Treatment:     pt not able today.  Plans to D/C to home with Hospice.  Pt can amb with lite assist and walker.     Rica Koyanagi  PTA Acute  Rehabilitation Services Pager      [phone] Office      [phone]

## 2019-02-03 NOTE — Progress Notes (Signed)
PROGRESS NOTE  Jesus [name] [id number] DOB: October 06, 1955 DOA: 01/11/2019 PCP: Patient, No Pcp Per   LOS: 22 days   Brief Narrative / Interim history: 64 year old male with multiple myeloma, arthritis, cervical stenosis, who was admitted to the hospital on 01/11/2019 with nosebleed.  He was found to have severe thrombocytopenia as well as anemia on admission and was transfused platelets.  Heme-onc has been consulted.  He was also have found profound hypercalcemia secondary to myeloma and nephrology has been consulted.  Prolonged hospital course due to persistent hypercalcemia, pancytopenia.  Oncology, nephrology and palliative care have all been consulted  Subjective / 24h Interval events: -No events overnight, calcium level continues to trend up -Oral intake remains very poor -Denies any muscle twitching  Assessment & Plan:  Severe hypercalcemia of malignancy -Secondary to progressive multiple myeloma, status post 3 doses of Zometa, calcitonin, fluids, even Lasix -Was also followed by nephrology, received last dose of Zometa on 12/26, nephrology eventually signed off as nothing further to offer  -Calcium Mia remains refractory to above treatment with 3 doses of bisphosphonates, calcitonin, IV fluids, Lasix -At this point we feel that he has no further options other than treating his progressive multiple myeloma -Await oncology input from Dr. 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 -Bone scan showed uptake over the right acetabulum mid to lower T-spine (ribs and likely cervicothoracic junction.  Also showed recent C3 compression fractures status post corpectomy with C2-C4 plating -Remains extremely debilitated however motivated to be treated  Acute kidney injury -Resolved  Hypokalemia, hypomagnesemia, hypophosphatemia -Replace  Epistaxis -On admission, resolved, no further bleeding  Severe protein calorie malnutrition -Dietary following -Continue supplements as tolerated, unfortunately p.o. intake remains very poor  Acute metabolic encephalopathy -Likely multifactorial, improved and remains alert and oriented, appropriate.  EEG showed generalized encephalopathy without evidence of epileptiform activities, CT head without acute changes  -Resolved  DVT prophylaxis: SCDs Code Status: DNR Family Communication: Discussed with patient, no family at bedside Disposition Plan: To be determined   Scheduled Meds: . calcitonin (salmon)  1 spray Alternating Nares Daily  . Chlorhexidine Gluconate Cloth  6 each Topical Daily  . dronabinol  10 mg Oral BID  . feeding supplement  1 Container Oral TID BM  . feeding supplement (PRO-STAT SUGAR FREE 64)  30 mL Oral BID  . furosemide  20 mg Intravenous Q6H  . oxymetazoline  1 spray Each Nare BID  . sucralfate  1 g Oral TID AC & HS   Continuous Infusions: . sodium chloride     PRN Meds:.acetaminophen, LORazepam, methocarbamol, ondansetron, oxyCODONE, senna-docusate, sodium chloride flush, witch hazel-glycerin  Consultants:  Oncology  Nephrology   Procedures: BM biopsy   Microbiology  SARS-CoV-2 12/20-negative  Antimicrobials: None     Objective: Vitals:   02/02/19 1107 02/02/19 1407 02/02/19 2145 02/03/19 0544  BP: 129/79 133/84 117/79 122/72  Pulse: 100 98 (!) 106 (!) 101  Resp: '16 18 18 18  ' Temp: 98.2 F (36.8 C)  98.1 F (36.7 C) 98.5 F (36.9 C)  98.7 F (37.1 C)  TempSrc: Oral Oral Oral Oral  SpO2: 99% 99% 95% 96%  Weight:    45.7 kg  Height:        Intake/Output Summary (Last 24 hours) at 02/03/2019 1408 Last data filed at 02/03/2019 0827 Gross per 24 hour  Intake 866 ml  Output 2150 ml  Net -1284 ml   Filed Weights   01/31/19 1029 02/02/19 0320 02/03/19 0544  Weight: 46 kg 45.5 kg 45.7 kg    Examination:  Gen: Extremely cachectic, chronically ill-appearing male, sitting up in bed, AAOx3 HEENT: Oral mucosa is moist Lungs: Decreased breath sounds at bases CVS: S1-S2, regular rate rhythm Abd: soft, Non tender, non distended, BS present Extremities: Trace edema Skin: no new rashes Psychiatric: Flat affect   Data Reviewed: I have independently reviewed following labs and imaging studies   CBC: Recent Labs  Lab 01/28/19 0502 01/29/19 0440 01/31/19 0434 02/01/19 0326 02/01/19 1821 02/02/19 0441 02/03/19 0401  WBC 2.6* 1.9* 3.1* 2.6* 2.7* 2.5* 2.5*  NEUTROABS 1.2* 0.8*  --   --   --   --   --   HGB 11.2* 9.6* 10.3* 10.0* 9.1* 9.7* 9.3*  HCT 34.3* 29.5* 31.1* 30.3* 27.3* 29.1* 28.1*  MCV 88.6 88.9 87.1 88.3 88.9 89.0 88.4  PLT 83* 53* 30* 23* 115* 119* 81*   Basic Metabolic Panel: Recent Labs  Lab 01/28/19 1458 01/30/19 0304 01/31/19 0434 02/01/19 0326 02/02/19 0441 02/03/19 0401  NA  --  135 130*  131* 128*  129* 131*  132* 134*  K  --  3.6 2.9*  2.9* 3.7  3.7 3.2*  3.2* 3.4*  CL  --  105 96*  97* 98  99 98  99 99  CO2  --  '23 24  24 23  24 24  25 27  ' GLUCOSE  --  100* 115*  112* 112*  115* 103*  104* 105*  BUN  --  '12 14  14 12  11 10  11 9  ' CREATININE  --  0.53* 0.57*  0.60* 0.64  0.52* 0.52*  0.44* 0.44*  CALCIUM  --  11.9* 12.3*  12.2* 11.9*  11.8* 12.3*  12.3* 13.0*  MG 1.3* 1.8  --   --   --   --   PHOS  --  2.2* 2.5 2.3* 2.6 2.1*   Liver Function Tests: Recent Labs  Lab 01/30/19 0304 01/31/19 0434 02/01/19 0326 02/02/19 0441 02/03/19 0401  AST 52* 48*  --   55*  --   ALT 80* 83*  --  95*  --   ALKPHOS 426* 445*  --  417*  --   BILITOT 0.4 0.7  --  0.7  --   PROT 7.2 7.8  --  7.7  --   ALBUMIN 2.8* 2.9*  3.0* 3.0* 3.1*  3.1* 3.0*   Coagulation Profile: No results for input(s): INR, PROTIME in the last 168 hours. HbA1C: No results for input(s): HGBA1C in the last 72 hours. CBG: No results for input(s): GLUCAP in the last 168 hours.   Radiology Studies: No results found.  Domenic Polite MD [facility]

## 2019-02-03 NOTE — Progress Notes (Signed)
Palliative Care Follow-up Note/Family Meeting  Met with Patient and his wife along with Dr. Cruzita Lederer to discuss goals of care and for advance care planning discussion. [name] was considerably better during our visit today and able to fully participate in our discussion. He and his wife have been hopeful that he would regain his strength and that his calcium and platelets would stabilize long enough for him to receive treatment. We explained the multiple interventions required to keep him stable including platelet transfusion and hypercalcemia interventions. Additionally we discussed concerns re: prognosis and EOL issues including code status. Now DNR.  Fortunately [name] is able to ambulate with minimal assist, but he is still very weak. We discussed that treatment of his myeloma with chemotherapy is really the only intervention that could help with his calcium and platelets, but this also has risks especially given how deconditioned he is currently. Will await Dr. Grier Mitts guidance here.   [name] expresses a desire to go home when possible, he is increasingly tired of being hospitalized and his wife tells [place] that he has already been through so much. We discussed the option of palliative care and even hospice care as an additional way to support him. He is considering his options and will discuss with his wife. Hospice eligibility will depend on his choices in terms of additional chemotherapy and transfusions.  Plan to check back in on them over the weekend as needed and touch base on Monday AM with Dr. Irene Limbo.  Lane Hacker, DO Palliative Medicine Time: 35 min Greater than 50%  of this time was spent counseling and coordinating care related to the above assessment and plan.

## 2019-02-04 LAB — CBC
HCT: 27.1 % — ABNORMAL LOW (ref 39.0–52.0)
Hemoglobin: 9 g/dL — ABNORMAL LOW (ref 13.0–17.0)
MCH: 29.7 pg (ref 26.0–34.0)
MCHC: 33.2 g/dL (ref 30.0–36.0)
MCV: 89.4 fL (ref 80.0–100.0)
Platelets: 53 10*3/uL — ABNORMAL LOW (ref 150–400)
RBC: 3.03 MIL/uL — ABNORMAL LOW (ref 4.22–5.81)
RDW: 17.9 % — ABNORMAL HIGH (ref 11.5–15.5)
WBC: 2.5 10*3/uL — ABNORMAL LOW (ref 4.0–10.5)
nRBC: 0 % (ref 0.0–0.2)

## 2019-02-04 LAB — COMPREHENSIVE METABOLIC PANEL
ALT: 62 U/L — ABNORMAL HIGH (ref 0–44)
AST: 36 U/L (ref 15–41)
Albumin: 2.9 g/dL — ABNORMAL LOW (ref 3.5–5.0)
Alkaline Phosphatase: 360 U/L — ABNORMAL HIGH (ref 38–126)
Anion gap: 9 (ref 5–15)
BUN: 8 mg/dL (ref 8–23)
CO2: 26 mmol/L (ref 22–32)
Calcium: 12.7 mg/dL — ABNORMAL HIGH (ref 8.9–10.3)
Chloride: 96 mmol/L — ABNORMAL LOW (ref 98–111)
Creatinine, Ser: 0.61 mg/dL (ref 0.61–1.24)
GFR calc Af Amer: >60 mL/min (ref >60)
GFR calc non Af Amer: >60 mL/min (ref >60)
Glucose, Bld: 110 mg/dL — ABNORMAL HIGH (ref 70–99)
Potassium: 3.1 mmol/L — ABNORMAL LOW (ref 3.5–5.1)
Sodium: 131 mmol/L — ABNORMAL LOW (ref 135–145)
Total Bilirubin: 0.6 mg/dL (ref 0.3–1.2)
Total Protein: 7.7 g/dL (ref 6.5–8.1)

## 2019-02-04 MED ORDER — POTASSIUM CHLORIDE CRYS ER 20 MEQ PO TBCR
40.0000 meq | EXTENDED_RELEASE_TABLET | Freq: Two times a day (BID) | ORAL | Status: AC
  Administered 2019-02-04 (×2): 40 meq via ORAL
  Filled 2019-02-04 (×2): qty 2

## 2019-02-04 NOTE — Progress Notes (Signed)
PROGRESS NOTE  MACHI WHITTAKER SKA:[id number] DOB: Apr 06, 1955 DOA: 01/11/2019 PCP: Patient, No Pcp Per   LOS: 23 days   Brief Narrative / Interim history: 64 year old male with multiple myeloma, arthritis, cervical stenosis, who was admitted to the hospital on 01/11/2019 with nosebleed.  He was found to have severe thrombocytopenia as well as anemia on admission and was transfused platelets.  Heme-onc has been consulted.  He was also have found profound hypercalcemia secondary to myeloma and nephrology has been consulted.  Prolonged hospital course due to persistent hypercalcemia, pancytopenia.  Oncology, nephrology and palliative care have all been consulted -Calcium remains refractory to therapy, has received numerous units of blood and platelets this admission,  -Status post numerous family meetings, patient wants therapy for Myeloma and now awaiting input from Dr. Irene Limbo on Monday  Subjective / 24h Interval events: -No events overnight, calcium level remains elevated but he is not symptomatic from this for the last couple of days, oral intake remains very poor, he sleeps most of the day  Assessment & Plan:  Severe hypercalcemia of malignancy -Secondary to progressive multiple myeloma, status post 3 doses of Zometa, calcitonin, fluids, even Lasix -Was also followed by nephrology, received last dose of Zometa on 12/26, nephrology eventually signed off as nothing further to offer  -Hypercalcemia remains refractory to above treatment with 3 doses of bisphosphonates, calcitonin, IV fluids, Lasix -At this point we feel that he has no further options other than treating his progressive multiple myeloma or Hospice -Await oncology input from Dr. 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 -Bone scan showed uptake over the right acetabulum mid to lower T-spine (ribs and likely cervicothoracic junction.  Also showed recent C3 compression fractures status post corpectomy with C2-C4 plating -Remains extremely debilitated however motivated to be treated -Hemoglobin stable around 9 now, platelet count is drifting down and 53K today  Acute kidney injury -Resolved  Hypokalemia, hypomagnesemia, hypophosphatemia -Replaced  Epistaxis -On admission, resolved, no further bleeding  Severe protein calorie malnutrition -Dietary following -Continue supplements as tolerated, unfortunately p.o. intake remains very poor  Acute metabolic encephalopathy -Likely multifactorial, improved and remains alert and oriented, appropriate.  EEG showed generalized encephalopathy without evidence of epileptiform activities, CT head without acute changes  -Resolved  DVT prophylaxis: SCDs Code Status: DNR Family Communication: Discussed with patient, no family at bedside Disposition Plan: To be determined   Scheduled Meds: . calcitonin (salmon)  1 spray Alternating Nares Daily  . Chlorhexidine Gluconate Cloth  6 each Topical Daily  . dronabinol  10 mg Oral BID  . feeding supplement  1 Container Oral TID BM  . feeding supplement (PRO-STAT SUGAR FREE 64)  30 mL Oral BID  . oxymetazoline  1 spray Each Nare BID  . potassium chloride  40 mEq Oral BID  . sucralfate  1 g Oral TID AC & HS   Continuous Infusions:  PRN Meds:.acetaminophen, LORazepam, methocarbamol, ondansetron, oxyCODONE, senna-docusate, sodium chloride flush, witch  hazel-glycerin  Consultants:  Oncology  Nephrology   Procedures: [facility] biopsy   Microbiology  SARS-CoV-2 12/20-negative  Antimicrobials: None     Objective: Vitals:   02/03/19 0544 02/03/19 1415 02/03/19 2051 02/04/19 0557  BP: 122/72 122/75 115/79 113/74  Pulse: (!) 101 100 (!) 102 100  Resp: '18 16 19 18  ' Temp: 98.7 F (37.1 C) 97.9 F (36.6 C) 97.8 F (36.6 C) 98.4 F (36.9 C)  TempSrc: Oral Oral Oral Oral  SpO2: 96% 99% 97% 97%  Weight: 45.7 kg   42.1 kg  Height:        Intake/Output Summary (Last 24 hours) at 02/04/2019 1323 Last data filed at 02/04/2019 0600 Gross per 24 hour  Intake 461.94 ml  Output 2400 ml  Net -1938.06 ml   Filed Weights   02/02/19 0320 02/03/19 0544 02/04/19 0557  Weight: 45.5 kg 45.7 kg 42.1 kg    Examination:  Gen: Extremely cachectic, chronically ill male sitting up in bed, AAOx3 HEENT: PERRLA, Neck supple, no JVD Lungs: Decreased breath sounds the bases CVS: S1-S2, regular rate rhythm Abd: soft, Non tender, non distended, BS present Extremities: Trace edema Skin: no new rashes Psychiatric: Flat affect   Data Reviewed: I have independently reviewed following labs and imaging studies   CBC: Recent Labs  Lab 01/29/19 0440 02/01/19 0326 02/01/19 1821 02/02/19 0441 02/03/19 0401 02/04/19 0509  WBC 1.9* 2.6* 2.7* 2.5* 2.5* 2.5*  NEUTROABS 0.8*  --   --   --   --   --   HGB 9.6* 10.0* 9.1* 9.7* 9.3* 9.0*  HCT 29.5* 30.3* 27.3* 29.1* 28.1* 27.1*  MCV 88.9 88.3 88.9 89.0 88.4 89.4  PLT 53* 23* 115* 119* 81* 53*   Basic Metabolic Panel: Recent Labs  Lab 01/28/19 1458 01/30/19 0304 01/31/19 0434 02/01/19 0326 02/02/19 0441 02/03/19 0401 02/04/19 0509  NA  --  135 130*  131* 128*  129* 131*  132* 134* 131*  K  --  3.6 2.9*  2.9* 3.7  3.7 3.2*  3.2* 3.4* 3.1*  CL  --  105 96*  97* 98  99 98  99 99 96*  CO2  --  '23 24  24 23  24 24  25 27 26  ' GLUCOSE  --  100* 115*  112* 112*  115* 103*  104* 105* 110*   BUN  --  '12 14  14 12  11 10  11 9 8  ' CREATININE  --  0.53* 0.57*  0.60* 0.64  0.52* 0.52*  0.44* 0.44* 0.61  CALCIUM  --  11.9* 12.3*  12.2* 11.9*  11.8* 12.3*  12.3* 13.0* 12.7*  MG 1.3* 1.8  --   --   --   --   --   PHOS  --  2.2* 2.5 2.3* 2.6 2.1*  --    Liver Function Tests: Recent Labs  Lab 01/30/19 0304 01/31/19 0434 02/01/19 0326 02/02/19 0441 02/03/19 0401 02/04/19 0509  AST 52* 48*  --  55*  --  36  ALT 80* 83*  --  95*  --  62*  ALKPHOS 426* 445*  --  417*  --  360*  BILITOT 0.4 0.7  --  0.7  --  0.6  PROT 7.2 7.8  --  7.7  --  7.7  ALBUMIN 2.8* 2.9*  3.0* 3.0* 3.1*  3.1* 3.0* 2.9*   Coagulation Profile: No results for input(s): INR, PROTIME in the last 168 hours. HbA1C: No results for input(s): HGBA1C in the last 72 hours.  CBG: No results for input(s): GLUCAP in the last 168 hours.   Radiology Studies: No results found.  Domenic Polite MD [facility]

## 2019-02-05 LAB — COMPREHENSIVE METABOLIC PANEL
ALT: 51 U/L — ABNORMAL HIGH (ref 0–44)
AST: 31 U/L (ref 15–41)
Albumin: 2.9 g/dL — ABNORMAL LOW (ref 3.5–5.0)
Alkaline Phosphatase: 335 U/L — ABNORMAL HIGH (ref 38–126)
Anion gap: 5 (ref 5–15)
BUN: 8 mg/dL (ref 8–23)
CO2: 25 mmol/L (ref 22–32)
Calcium: 13.8 mg/dL (ref 8.9–10.3)
Chloride: 103 mmol/L (ref 98–111)
Creatinine, Ser: 0.57 mg/dL — ABNORMAL LOW (ref 0.61–1.24)
GFR calc Af Amer: >60 mL/min (ref >60)
GFR calc non Af Amer: >60 mL/min (ref >60)
Glucose, Bld: 104 mg/dL — ABNORMAL HIGH (ref 70–99)
Potassium: 4.1 mmol/L (ref 3.5–5.1)
Sodium: 133 mmol/L — ABNORMAL LOW (ref 135–145)
Total Bilirubin: 0.4 mg/dL (ref 0.3–1.2)
Total Protein: 7.9 g/dL (ref 6.5–8.1)

## 2019-02-05 LAB — CBC
HCT: 27.5 % — ABNORMAL LOW (ref 39.0–52.0)
Hemoglobin: 8.8 g/dL — ABNORMAL LOW (ref 13.0–17.0)
MCH: 28.9 pg (ref 26.0–34.0)
MCHC: 32 g/dL (ref 30.0–36.0)
MCV: 90.5 fL (ref 80.0–100.0)
Platelets: 38 10*3/uL — ABNORMAL LOW (ref 150–400)
RBC: 3.04 MIL/uL — ABNORMAL LOW (ref 4.22–5.81)
RDW: 18 % — ABNORMAL HIGH (ref 11.5–15.5)
WBC: 2.2 10*3/uL — ABNORMAL LOW (ref 4.0–10.5)
nRBC: 0 % (ref 0.0–0.2)

## 2019-02-05 MED ORDER — SODIUM CHLORIDE 0.9 % IV SOLN: INTRAVENOUS | Status: AC

## 2019-02-05 NOTE — Progress Notes (Signed)
PROGRESS NOTE  Jesus [name] QTM:[id number] DOB: 1955-08-26 DOA: 01/11/2019 PCP: Patient, No Pcp Per   LOS: 24 days   Brief Narrative / Interim history: 64 year old male with multiple myeloma, arthritis, cervical stenosis, who was admitted to the hospital on 01/11/2019 with nosebleed.  He was found to have severe thrombocytopenia as well as anemia on admission and was transfused platelets.  Heme-onc has been consulted.  He was also have found profound hypercalcemia secondary to myeloma and nephrology has been consulted.  Prolonged hospital course due to persistent hypercalcemia, pancytopenia.  Oncology, nephrology and palliative care have all been consulted -Calcium remains refractory to therapy, has received numerous units of blood and platelets this admission,  -Status post numerous family meetings, patient wants therapy for Myeloma and now awaiting input from Dr. Irene Limbo on Monday  Subjective / 24h Interval events: -Patient reports feeling weak. Reports taking some bites and sips. Denies abdominal pain, cramps, confusion. His calcium level continues to be elevated but he appears to be asymptomatic.   Assessment & Plan:  Severe hypercalcemia of malignancy -Secondary to progressive multiple myeloma, status post 3 doses of Zometa, calcitonin, fluids, even Lasix -Was also followed by nephrology, received last dose of Zometa on 12/26, nephrology eventually signed off as nothing further to offer  -Hypercalcemia remains refractory to above treatment with 3 doses of bisphosphonates, calcitonin, IV fluids. Discontinued Lasix -At this point, the recommendation is to continue treating his progressive multiple myeloma or Hospice -Await oncology input from Dr. Irene Limbo on Monday -Fortunately since his calcium has been chronically elevated and worsening he is not very symptomatic from this now -Greatly appreciate palliative input from Dr.Golding  Pancytopenia in the setting of multiple  myeloma/myelodysplasia -Has received a total of 6 units of platelets thus far, last 2 units were on 12/30 -Status post 4 units PRBC so far -Oncology consulting -Underwent a bone marrow biopsy on 01/16/2019 which showed persistent involvement by plasma cell myeloma as well as mild myeloid dysplasia. -Bone scan showed uptake over the right acetabulum mid to lower T-spine (ribs and likely cervicothoracic junction.  Also showed recent C3 compression fractures status post corpectomy with C2-C4 plating -Remains extremely debilitated however motivated to be treated -Hemoglobin stable around 9 now, platelet count is drifting down and 38K today. Continue fall precautions. If platelets continue to trend down, will likely need transfusion again, will defer to Oncology.   Acute kidney injury -Resolved  Hypokalemia, hypomagnesemia, hypophosphatemia -Replaced  Epistaxis -On admission, resolved, no further bleeding  Severe protein calorie malnutrition -Dietary following -Continue supplements as tolerated, unfortunately p.o. intake remains very poor  Acute metabolic encephalopathy -Likely multifactorial, improved and remains alert and oriented, appropriate.  EEG showed generalized encephalopathy without evidence of epileptiform activities, CT head without acute changes  -Resolved  DVT prophylaxis: SCDs Code Status: DNR Family Communication: Discussed with patient, and his wife at the bedside Disposition Plan: To be determined   Scheduled Meds: . calcitonin (salmon)  1 spray Alternating Nares Daily  . Chlorhexidine Gluconate Cloth  6 each Topical Daily  . dronabinol  10 mg Oral BID  . feeding supplement  1 Container Oral TID BM  . feeding supplement (PRO-STAT SUGAR FREE 64)  30 mL Oral BID  . oxymetazoline  1 spray Each Nare BID  . sucralfate  1 g Oral TID AC & HS   Continuous Infusions:  PRN Meds:.acetaminophen, LORazepam, methocarbamol, ondansetron, oxyCODONE, senna-docusate, sodium  chloride flush, witch hazel-glycerin  Consultants:  Oncology  Nephrology   Procedures: BM biopsy  Microbiology  SARS-CoV-2 12/20-negative  Antimicrobials: None     Objective: Vitals:   02/04/19 1437 02/04/19 2052 02/05/19 0603 02/05/19 1427  BP: 112/78 112/76 120/82 112/73  Pulse: 98 98 (!) 103 93  Resp: '20 19 18 ' (!) 24  Temp: 97.6 F (36.4 C) 98.1 F (36.7 C) 98.1 F (36.7 C) 98.2 F (36.8 C)  TempSrc:  Oral Oral   SpO2: 98% 98% 98% 100%  Weight:   41.1 kg   Height:        Intake/Output Summary (Last 24 hours) at 02/05/2019 1607 Last data filed at 02/05/2019 0604 Gross per 24 hour  Intake --  Output 2000 ml  Net -2000 ml   Filed Weights   02/03/19 0544 02/04/19 0557 02/05/19 0603  Weight: 45.7 kg 42.1 kg 41.1 kg    Examination:  Gen: Extremely cachectic, chronically ill male sitting up in bed, AAOx3 HEENT: PERRLA, Neck supple, no JVD Lungs: Decreased breath sounds bilaterally, no respiratory distress CVS: S1-S2, regular rate rhythm Abd: soft, Non tender, non distended, BS present Extremities: Trace edema Skin: no new rashes Psychiatric: Flat affect   Data Reviewed: I have independently reviewed following labs and imaging studies   CBC: Recent Labs  Lab 02/01/19 1821 02/02/19 0441 02/03/19 0401 02/04/19 0509 02/05/19 0415  WBC 2.7* 2.5* 2.5* 2.5* 2.2*  HGB 9.1* 9.7* 9.3* 9.0* 8.8*  HCT 27.3* 29.1* 28.1* 27.1* 27.5*  MCV 88.9 89.0 88.4 89.4 90.5  PLT 115* 119* 81* 53* 38*   Basic Metabolic Panel: Recent Labs  Lab 01/30/19 0304 01/31/19 0434 02/01/19 0326 02/02/19 0441 02/03/19 0401 02/04/19 0509 02/05/19 0415  NA 135 130*  131* 128*  129* 131*  132* 134* 131* 133*  K 3.6 2.9*  2.9* 3.7  3.7 3.2*  3.2* 3.4* 3.1* 4.1  CL 105 96*  97* 98  99 98  99 99 96* 103  CO2 '23 24  24 23  24 24  25 27 26 25  ' GLUCOSE 100* 115*  112* 112*  115* 103*  104* 105* 110* 104*  BUN '12 14  14 12  11 10  11 9 8 8  ' CREATININE 0.53* 0.57*   0.60* 0.64  0.52* 0.52*  0.44* 0.44* 0.61 0.57*  CALCIUM 11.9* 12.3*  12.2* 11.9*  11.8* 12.3*  12.3* 13.0* 12.7* 13.8*  MG 1.8  --   --   --   --   --   --   PHOS 2.2* 2.5 2.3* 2.6 2.1*  --   --    Liver Function Tests: Recent Labs  Lab 01/30/19 0304 01/31/19 0434 02/01/19 0326 02/02/19 0441 02/03/19 0401 02/04/19 0509 02/05/19 0415  AST 52* 48*  --  55*  --  36 31  ALT 80* 83*  --  95*  --  62* 51*  ALKPHOS 426* 445*  --  417*  --  360* 335*  BILITOT 0.4 0.7  --  0.7  --  0.6 0.4  PROT 7.2 7.8  --  7.7  --  7.7 7.9  ALBUMIN 2.8* 2.9*  3.0* 3.0* 3.1*  3.1* 3.0* 2.9* 2.9*   Coagulation Profile: No results for input(s): INR, PROTIME in the last 168 hours. HbA1C: No results for input(s): HGBA1C in the last 72 hours. CBG: No results for input(s): GLUCAP in the last 168 hours.   Radiology Studies: No results found.  Adele Barthel, MD [facility]

## 2019-02-05 NOTE — Progress Notes (Signed)
CRITICAL VALUE ALERT  Critical Value:  Ca 13.8  Date & Time Notied:  02/05/2019  Provider Notified: Humphrey Rolls   Orders Received/Actions taken:

## 2019-02-06 LAB — COMPREHENSIVE METABOLIC PANEL
ALT: 44 U/L (ref 0–44)
AST: 31 U/L (ref 15–41)
Albumin: 2.9 g/dL — ABNORMAL LOW (ref 3.5–5.0)
Alkaline Phosphatase: 310 U/L — ABNORMAL HIGH (ref 38–126)
Anion gap: 5 (ref 5–15)
BUN: 10 mg/dL (ref 8–23)
CO2: 26 mmol/L (ref 22–32)
Calcium: 13.9 mg/dL (ref 8.9–10.3)
Chloride: 101 mmol/L (ref 98–111)
Creatinine, Ser: 0.55 mg/dL — ABNORMAL LOW (ref 0.61–1.24)
GFR calc Af Amer: >60 mL/min (ref >60)
GFR calc non Af Amer: >60 mL/min (ref >60)
Glucose, Bld: 99 mg/dL (ref 70–99)
Potassium: 3.8 mmol/L (ref 3.5–5.1)
Sodium: 132 mmol/L — ABNORMAL LOW (ref 135–145)
Total Bilirubin: 0.3 mg/dL (ref 0.3–1.2)
Total Protein: 7.8 g/dL (ref 6.5–8.1)

## 2019-02-06 LAB — CBC WITH DIFFERENTIAL/PLATELET
Abs Immature Granulocytes: 0.04 10*3/uL (ref 0.00–0.07)
Basophils Absolute: 0 10*3/uL (ref 0.0–0.1)
Basophils Relative: 0 %
Eosinophils Absolute: 0 10*3/uL (ref 0.0–0.5)
Eosinophils Relative: 0 %
HCT: 27.3 % — ABNORMAL LOW (ref 39.0–52.0)
Hemoglobin: 8.9 g/dL — ABNORMAL LOW (ref 13.0–17.0)
Immature Granulocytes: 2 %
Lymphocytes Relative: 35 %
Lymphs Abs: 0.7 10*3/uL (ref 0.7–4.0)
MCH: 29.4 pg (ref 26.0–34.0)
MCHC: 32.6 g/dL (ref 30.0–36.0)
MCV: 90.1 fL (ref 80.0–100.0)
Monocytes Absolute: 0.3 10*3/uL (ref 0.1–1.0)
Monocytes Relative: 15 %
Neutro Abs: 1 10*3/uL — ABNORMAL LOW (ref 1.7–7.7)
Neutrophils Relative %: 48 %
Platelets: 25 10*3/uL — CL (ref 150–400)
RBC: 3.03 MIL/uL — ABNORMAL LOW (ref 4.22–5.81)
RDW: 17.6 % — ABNORMAL HIGH (ref 11.5–15.5)
WBC: 2.1 10*3/uL — ABNORMAL LOW (ref 4.0–10.5)
nRBC: 0 % (ref 0.0–0.2)

## 2019-02-06 MED ORDER — SODIUM CHLORIDE 0.9 % IV SOLN: INTRAVENOUS | Status: AC

## 2019-02-06 NOTE — Progress Notes (Signed)
PROGRESS NOTE  KEIDRICK MURTY TRV:[id number] DOB: May 21, 1955 DOA: 01/11/2019 PCP: Patient, No Pcp Per   LOS: 25 days   Brief Narrative / Interim history: 64 year old male with multiple myeloma, arthritis, cervical stenosis, who was admitted to the hospital on 01/11/2019 with nosebleed.  He was found to have severe thrombocytopenia as well as anemia on admission and was transfused platelets.  Heme-onc has been consulted.  He was also have found profound hypercalcemia secondary to myeloma and nephrology has been consulted.  Prolonged hospital course due to persistent hypercalcemia, pancytopenia.  Oncology, nephrology and palliative care have all been consulted -Calcium remains refractory to therapy, has received numerous units of blood and platelets this admission,  -Status post numerous family meetings, patient wants therapy for Myeloma and now awaiting input from Dr. Irene Limbo on Monday  Subjective / 24h Interval events: -He had small amount of nosebleed this morning but this has resolved now. He is eating more today per his report. Continues to feel weak.   Assessment & Plan:  Severe hypercalcemia of malignancy -Secondary to progressive multiple myeloma, status post 3 doses of Zometa, calcitonin, fluids, even Lasix -Was also followed by nephrology, received last dose of Zometa on 12/26, nephrology eventually signed off as nothing further to offer  -Hypercalcemia remains refractory to above treatment with 3 doses of bisphosphonates, calcitonin, IV fluids. Discontinued Lasix -At this point, the recommendation is to continue treating his progressive multiple myeloma or Hospice -Await oncology input from Dr. Irene Limbo today  -Fortunately since his calcium has been chronically elevated and worsening he is not very symptomatic from this now -Greatly appreciate palliative input  Pancytopenia in the setting of multiple myeloma/myelodysplasia -Has received a total of 6 units of platelets thus far, last 2  units were on 12/30 -Status post 4 units PRBC so far -Oncology consulting, patient to be seen again today--I paged Dr. Irene Limbo today -Underwent a bone marrow biopsy on 01/16/2019 which showed persistent involvement by plasma cell myeloma as well as mild myeloid dysplasia. -Bone scan showed uptake over the right acetabulum mid to lower T-spine (ribs and likely cervicothoracic junction.  Also showed recent C3 compression fractures status post corpectomy with C2-C4 plating -Remains extremely debilitated however motivated to be treated -Hemoglobin stable around 9 now, platelet count is drifting down and 25K today. Continue fall precautions. If platelets continue to trend down, will likely need transfusion again, will defer to Oncology.   Acute kidney injury -Resolved  Hypokalemia, hypomagnesemia, hypophosphatemia -Replaced  Epistaxis -On admission, resolved, no further bleeding  Severe protein calorie malnutrition -Dietary following -Continue supplements as tolerated, unfortunately p.o. intake remains very poor  Acute metabolic encephalopathy -Likely multifactorial, improved and remains alert and oriented, appropriate.  EEG showed generalized encephalopathy without evidence of epileptiform activities, CT head without acute changes  -Resolved  DVT prophylaxis: SCDs Code Status: DNR Family Communication: Discussed with patient, and his wife at the bedside Disposition Plan: To be determined   Scheduled Meds: . calcitonin (salmon)  1 spray Alternating Nares Daily  . Chlorhexidine Gluconate Cloth  6 each Topical Daily  . dronabinol  10 mg Oral BID  . feeding supplement  1 Container Oral TID BM  . feeding supplement (PRO-STAT SUGAR FREE 64)  30 mL Oral BID  . oxymetazoline  1 spray Each Nare BID  . sucralfate  1 g Oral TID AC & HS   Continuous Infusions:  PRN Meds:.acetaminophen, LORazepam, methocarbamol, ondansetron, oxyCODONE, senna-docusate, sodium chloride flush, witch  hazel-glycerin  Consultants:  Oncology  Nephrology  Procedures: BM biopsy   Microbiology  SARS-CoV-2 12/20-negative  Antimicrobials: None     Objective: Vitals:   02/05/19 0603 02/05/19 1427 02/05/19 2111 02/06/19 0543  BP: 120/82 112/73 117/74 116/74  Pulse: (!) 103 93 (!) 103 91  Resp: 18 (!) '24 20 19  ' Temp: 98.1 F (36.7 C) 98.2 F (36.8 C) 98.6 F (37 C) 98.2 F (36.8 C)  TempSrc: Oral  Oral Oral  SpO2: 98% 100% 99% 99%  Weight: 41.1 kg   39.1 kg  Height:        Intake/Output Summary (Last 24 hours) at 02/06/2019 1749 Last data filed at 02/06/2019 0308 Gross per 24 hour  Intake 545 ml  Output --  Net 545 ml   Filed Weights   02/04/19 0557 02/05/19 0603 02/06/19 0543  Weight: 42.1 kg 41.1 kg 39.1 kg    Examination:  Gen: Extremely cachectic, chronically ill male sitting up in chair, AAOx3 HEENT: PERRLA, Neck supple, no JVD Lungs: Decreased breath sounds bilaterally, no respiratory distress CVS: S1-S2, regular rate rhythm Abd: soft, Non tender, non distended, BS present Extremities: Trace edema Skin: no new rashes Psychiatric: Flat affect   Data Reviewed: I have independently reviewed following labs and imaging studies   CBC: Recent Labs  Lab 02/02/19 0441 02/03/19 0401 02/04/19 0509 02/05/19 0415 02/06/19 0416  WBC 2.5* 2.5* 2.5* 2.2* 2.1*  NEUTROABS  --   --   --   --  1.0*  HGB 9.7* 9.3* 9.0* 8.8* 8.9*  HCT 29.1* 28.1* 27.1* 27.5* 27.3*  MCV 89.0 88.4 89.4 90.5 90.1  PLT 119* 81* 53* 38* 25*   Basic Metabolic Panel: Recent Labs  Lab 01/31/19 0434 02/01/19 0326 02/02/19 0441 02/03/19 0401 02/04/19 0509 02/05/19 0415 02/06/19 0416  NA 130*  131* 128*  129* 131*  132* 134* 131* 133* 132*  K 2.9*  2.9* 3.7  3.7 3.2*  3.2* 3.4* 3.1* 4.1 3.8  CL 96*  97* 98  99 98  99 99 96* 103 101  CO2 '24  24 23  24 24  25 27 26 25 26  ' GLUCOSE 115*  112* 112*  115* 103*  104* 105* 110* 104* 99  BUN '14  14 12  11 10  11 9 8 8 10   ' CREATININE 0.57*  0.60* 0.64  0.52* 0.52*  0.44* 0.44* 0.61 0.57* 0.55*  CALCIUM 12.3*  12.2* 11.9*  11.8* 12.3*  12.3* 13.0* 12.7* 13.8* 13.9*  PHOS 2.5 2.3* 2.6 2.1*  --   --   --    Liver Function Tests: Recent Labs  Lab 01/31/19 0434 02/02/19 0441 02/03/19 0401 02/04/19 0509 02/05/19 0415 02/06/19 0416  AST 48* 55*  --  36 31 31  ALT 83* 95*  --  62* 51* 44  ALKPHOS 445* 417*  --  360* 335* 310*  BILITOT 0.7 0.7  --  0.6 0.4 0.3  PROT 7.8 7.7  --  7.7 7.9 7.8  ALBUMIN 2.9*  3.0* 3.1*  3.1* 3.0* 2.9* 2.9* 2.9*   Coagulation Profile: No results for input(s): INR, PROTIME in the last 168 hours. HbA1C: No results for input(s): HGBA1C in the last 72 hours. CBG: No results for input(s): GLUCAP in the last 168 hours.   Radiology Studies: No results found.  Adele Barthel, MD [facility]

## 2019-02-06 NOTE — Progress Notes (Addendum)
CRITICAL VALUE ALERT  Critical Value:  PLT 25   Date & Time Notied:  02/06/2019 0608  Provider Notified: Humphrey Rolls, MD   Orders Received/Actions taken: will monitor for bleeding

## 2019-02-06 NOTE — Progress Notes (Addendum)
CRITICAL VALUE ALERT  Critical Value:  Ca 13.9   Date & Time Notied:  02/06/2019 [id number]   Provider Notified: Humphrey Rolls MD   Orders Received/Actions taken: no new orders

## 2019-02-07 ENCOUNTER — Encounter (HOSPITAL_COMMUNITY): Payer: Self-pay | Admitting: Internal Medicine

## 2019-02-07 LAB — COMPREHENSIVE METABOLIC PANEL
ALT: 44 U/L (ref 0–44)
AST: 34 U/L (ref 15–41)
Albumin: 2.9 g/dL — ABNORMAL LOW (ref 3.5–5.0)
Alkaline Phosphatase: 329 U/L — ABNORMAL HIGH (ref 38–126)
Anion gap: 5 (ref 5–15)
BUN: 11 mg/dL (ref 8–23)
CO2: 27 mmol/L (ref 22–32)
Calcium: 14.1 mg/dL (ref 8.9–10.3)
Chloride: 100 mmol/L (ref 98–111)
Creatinine, Ser: 0.52 mg/dL — ABNORMAL LOW (ref 0.61–1.24)
GFR calc Af Amer: >60 mL/min (ref >60)
GFR calc non Af Amer: >60 mL/min (ref >60)
Glucose, Bld: 105 mg/dL — ABNORMAL HIGH (ref 70–99)
Potassium: 3.7 mmol/L (ref 3.5–5.1)
Sodium: 132 mmol/L — ABNORMAL LOW (ref 135–145)
Total Bilirubin: 0.7 mg/dL (ref 0.3–1.2)
Total Protein: 8 g/dL (ref 6.5–8.1)

## 2019-02-07 LAB — CBC WITH DIFFERENTIAL/PLATELET
Abs Immature Granulocytes: 0.03 10*3/uL (ref 0.00–0.07)
Basophils Absolute: 0 10*3/uL (ref 0.0–0.1)
Basophils Relative: 0 %
Eosinophils Absolute: 0 10*3/uL (ref 0.0–0.5)
Eosinophils Relative: 0 %
HCT: 26.9 % — ABNORMAL LOW (ref 39.0–52.0)
Hemoglobin: 8.7 g/dL — ABNORMAL LOW (ref 13.0–17.0)
Immature Granulocytes: 1 %
Lymphocytes Relative: 38 %
Lymphs Abs: 0.9 10*3/uL (ref 0.7–4.0)
MCH: 29.3 pg (ref 26.0–34.0)
MCHC: 32.3 g/dL (ref 30.0–36.0)
MCV: 90.6 fL (ref 80.0–100.0)
Monocytes Absolute: 0.4 10*3/uL (ref 0.1–1.0)
Monocytes Relative: 17 %
Neutro Abs: 1.1 10*3/uL — ABNORMAL LOW (ref 1.7–7.7)
Neutrophils Relative %: 44 %
Platelets: 17 10*3/uL — CL (ref 150–400)
RBC: 2.97 MIL/uL — ABNORMAL LOW (ref 4.22–5.81)
RDW: 17.4 % — ABNORMAL HIGH (ref 11.5–15.5)
WBC: 2.5 10*3/uL — ABNORMAL LOW (ref 4.0–10.5)
nRBC: 0 % (ref 0.0–0.2)

## 2019-02-07 MED ORDER — POLYETHYLENE GLYCOL 3350 17 G PO PACK
17.0000 g | PACK | Freq: Every day | ORAL | Status: DC | PRN
  Administered 2019-02-07: 17 g via ORAL
  Filled 2019-02-07: qty 1

## 2019-02-07 MED ORDER — OXYMETAZOLINE HCL 0.05 % NA SOLN
2.0000 | NASAL | Status: AC
  Administered 2019-02-07: 2 via NASAL

## 2019-02-07 NOTE — Progress Notes (Signed)
Awaiting oncology input for plan of care.  [facility] aware, no response from [facility] MD at this time per [facility] MD note.  Dr. Jana Hakim- oncology on-call consulted. Follow up for MD contact given and followed.

## 2019-02-07 NOTE — Progress Notes (Signed)
CRITICAL VALUE ALERT  Critical Value:  PLT 17  Date & Time Notied:  02/07/2019 [id number]  Provider Notified: Baltazar Najjar, NP   Orders Received/Actions taken: N/A

## 2019-02-07 NOTE — Progress Notes (Signed)
CRITICAL VALUE ALERT  Critical Value:  Ca 14.1  Date & Time Notied:  02/07/2019 [id number]  Provider Notified: Baltazar Najjar NP   Orders Received/Actions taken: N/A

## 2019-02-07 NOTE — Progress Notes (Signed)
Physical Therapy Treatment Patient Details Name: Jesus [name] MRN: [id number] DOB: [birth year] Today's Date: 02/07/2019    History of Present Illness Pt is 64 year old male with history of multiple myeloma, arthritis, cervical stenosis who presented with nosebleed.  Pt was found to have severe thrombocytopenia, normocytic anemia, and hypercalcemia secondary to multiple myeloma.  Noted pt with pallative consult.    PT Comments    Pt was able to ambulate with min guard and RW.  Needs cues for safety and min guard for safety as he fatigues easily.  Further therapy limited today due to fatigue and pt needing to have BM.  Cont POC   Follow Up Recommendations  Home health PT;Supervision for mobility/OOB     Equipment Recommendations  Rolling walker with 5" wheels    Recommendations for Other Services       Precautions / Restrictions Precautions Precautions: Fall Precaution Comments: Platelets remain low    Mobility  Bed Mobility               General bed mobility comments: pt in recliner upon arrival  Transfers Overall transfer level: Needs assistance Equipment used: Rolling walker (2 wheeled) Transfers: Sit to/from [facility] Sit to Stand: Min guard Stand pivot transfers: Min guard       General transfer comment: cues for hand placement,  min/guard for safetywalker  Ambulation/Gait                 Stairs             Wheelchair Mobility    Modified Rankin (Stroke Patients Only)       Balance Overall balance assessment: Needs assistance Sitting-balance support: Feet supported;No upper extremity supported Sitting balance-Leahy Scale: Good     Standing balance support: No upper extremity supported;During functional activity Standing balance-Leahy Scale: Fair                              Cognition Arousal/Alertness: Awake/alert Behavior During Therapy: Flat affect Overall Cognitive Status: Within Functional  Limits for tasks assessed                                        Exercises      General Comments General comments (skin integrity, edema, etc.): Left on BSC with wife present (pt has been doing toileting with wife assisting)      Pertinent Vitals/Pain Pain Assessment: No/denies pain    Home Living                      Prior Function            PT Goals (current goals can now be found in the care plan section) Progress towards PT goals: Progressing toward goals    Frequency    Min 3X/week      PT Plan Current plan remains appropriate    Co-evaluation              AM-PAC PT "6 Clicks" Mobility   Outcome Measure  Help needed turning from your back to your side while in a flat bed without using bedrails?: A Little Help needed moving from lying on your back to sitting on the side of a flat bed without using bedrails?: A Little Help needed moving to and from a bed to a chair (including a wheelchair)?:  None Help needed standing up from a chair using your arms (e.g., wheelchair or bedside chair)?: None Help needed to walk in hospital room?: None Help needed climbing 3-5 steps with a railing? : A Little 6 Click Score: 21    End of Session Equipment Utilized During Treatment: Gait belt Activity Tolerance: Patient tolerated treatment well Patient left: with family/visitor present(on BSC with wife to assist (pt has been doing toileting with wife assisting)) Nurse Communication: Mobility status PT Visit Diagnosis: Unsteadiness on feet (R26.81);Other abnormalities of gait and mobility (R26.89)     Time: 1230-1240 PT Time Calculation (min) (ACUTE ONLY): 10 min  Charges:  $Gait Training: 8-22 mins                     Maggie Font, PT Acute Rehab Services Pager [phone] [facility] [phone]    Karlton Lemon 02/07/2019, 2:23 PM

## 2019-02-07 NOTE — Progress Notes (Signed)
Progress Note    Jesus [name]  IOX:[id number] DOB: [birth year]  DOA: 01/11/2019 PCP: Patient, No Pcp Per      Brief Narrative:    Medical records reviewed and are as summarized below:  Jesus [name] is an 64 y.o. male with medical history significant for multiple myeloma, arthritis, cervical stenosis, who was admitted to the hospital on 01/11/2019 with nosebleed.  He was found to have severe thrombocytopenia as well as anemia on admission and was transfused platelets.  Heme-onc has been consulted.  He was also have found profound hypercalcemia secondary to myeloma and nephrology has been consulted.  Prolonged hospital course due to persistent hypercalcemia, pancytopenia.  Oncology, nephrology and palliative care have all been consulted -Calcium remains refractory to therapy, has received numerous units of blood and platelets this admission,  -Status post numerous family meetings, patient wants therapy for Myeloma and now awaiting input from Dr. Irene Limbo on Monday      Assessment/Plan:   Active Problems:   Hypercalcemia   Epistaxis   Thrombocytopenia (HCC)   Anemia   Leukopenia   Body mass index is 13.91 kg/m.    Severe hypercalcemia of malignancy -Secondary to progressive multiple myeloma, status post 3 doses of Zometa, calcitonin, fluids, Lasix -Was also followed by nephrology, received last dose of Zometa on 12/26, nephrology eventually signed off as nothing further to offer  -Hypercalcemia remains refractory to above treatment with 3 doses of bisphosphonates, calcitonin, IV fluids.  He is off of Lasix -At this point, the recommendation is to continue treating his progressive multiple myeloma or Hospice -Await oncology input from Dr. Irene Limbo.  I called Dr. Irene Limbo on his cell phone at 2:20 PM today.  Awaiting response. Follow-up with palliative care team.  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 Irene Limbo today -Underwent a bone marrow biopsy on 01/16/2019 which showed persistent involvement by plasma cell myeloma as well as mild myeloid dysplasia. -Bone scan showed uptake over the right acetabulum mid to lower T-spine (ribs and likely cervicothoracic junction.  Also showed recent C3 compression fractures status post corpectomy with C2-C4 plating -Remains extremely debilitated however motivated to be treated  Acute kidney injury -Resolved  Hypokalemia, hypomagnesemia, hypophosphatemia -Replaced  Epistaxis -On admission, resolved, no further bleeding  Severe protein calorie malnutrition -Dietary following -Continue supplements as tolerated, unfortunately p.o. intake remains very poor  Acute metabolic encephalopathy -Likely multifactorial, improved and remains alert and oriented, appropriate.  EEG showed generalized encephalopathy without evidence of epileptiform activities, CT head without acute changes  -Resolved    Family Communication/Anticipated D/C date and plan/Code Status   DVT prophylaxis:  Code Status:  Family Communication: Plan discussed with patient's wife at the bedside Disposition Plan: Likely discharge to home with home health therapy versus hospice     Subjective:   He feels weak.  He has no other complaints.  He feels a little better but he said he is not back to his baseline.    Objective:    Vitals:   02/05/19 2111 02/06/19 0543 02/06/19 2000 02/07/19 0444  BP: 117/74 116/74 120/71 128/80  Pulse: (!) 103 91 (!) 103 (!) 101  Resp: _0 Temp: 98.6 F (37 C) 98.2 F (36.8 C) 98.3 F (36.8 C) 98.4 F (36.9 C)  TempSrc: Oral Oral    SpO2: 99% 99% 99% 99%  Weight:  39.1 kg    Height:        Intake/Output Summary (Last 24 hours) at 02/07/2019 1211 Last data filed at 02/07/2019 0537 Gross per 24 hour    Intake 373.99 ml  Output 2125 ml  Net -1751.01 ml   Filed Weights   02/04/19 0557 02/05/19 0603 02/06/19 0543  Weight: 42.1 kg 41.1 kg 39.1 kg    Exam:  GEN: NAD, cachectic SKIN: No rash EYES: EOMI ENT: MMM CV: RRR PULM: CTA B ABD: soft, ND, NT, +BS CNS: AAO x 3, non focal EXT: No edema or tenderness   Data Reviewed:   I have personally reviewed following labs and imaging studies:  Labs: Labs show the following:   Basic Metabolic Panel: Recent Labs  Lab 02/01/19 0326 02/02/19 0441 02/03/19 0401 02/04/19 0509 02/05/19 0415 02/06/19 0416 02/07/19 0401  NA 128*  129* 131*  132* 134* 131* 133* 132* 132*  K 3.7  3.7 3.2*  3.2* 3.4* 3.1* 4.1 3.8 3.7  CL 98  99 98  99 99 96* 103 101 100  CO2 _0 GLUCOSE 112*  115* 103*  104* 105* 110* 104* 99 105*  BUN _1 CREATININE 0.64  0.52* 0.52*  0.44* 0.44* 0.61 0.57* 0.55* 0.52*  CALCIUM 11.9*  11.8* 12.3*  12.3* 13.0* 12.7* 13.8* 13.9* 14.1*  PHOS 2.3* 2.6 2.1*  --   --   --   --    GFR Estimated Creatinine Clearance: 52.3 mL/min (A) (by C-G formula based on SCr of 0.52 mg/dL (L)). Liver Function Tests: Recent Labs  Lab 02/02/19 0441 02/03/19 0401 02/04/19 0509 02/05/19 0415 02/06/19 0416 02/07/19 0401  AST 55*  --  36 31 31 34  ALT 95*  --  62* 51* 44 44  ALKPHOS 417*  --  360* 335* 310* 329*  BILITOT 0.7  --  0.6 0.4 0.3 0.7  PROT 7.7  --  7.7 7.9 7.8 8.0  ALBUMIN 3.1*  3.1* 3.0* 2.9* 2.9* 2.9* 2.9*   No results for input(s): LIPASE, AMYLASE in the last 168 hours. No results for input(s): AMMONIA in the last 168 hours. Coagulation profile No results for input(s): INR, PROTIME in the last 168 hours.  CBC: Recent Labs  Lab 02/03/19 0401 02/04/19 0509 02/05/19 0415 02/06/19 0416 02/07/19 0401  WBC 2.5* 2.5* 2.2* 2.1* 2.5*  NEUTROABS  --   --   --  1.0* 1.1*  HGB 9.3* 9.0* 8.8* 8.9* 8.7*  HCT 28.1* 27.1* 27.5* 27.3* 26.9*  MCV 88.4  89.4 90.5 90.1 90.6  PLT 81* 53* 38* 25* 17*   Cardiac Enzymes: No results for input(s): CKTOTAL, CKMB, CKMBINDEX, TROPONINI in the last 168 hours. BNP (last 3 results) No results for input(s): PROBNP in the last 8760 hours. CBG: No results for input(s): GLUCAP in the last 168 hours. D-Dimer: No results for input(s): DDIMER in the last 72 hours. Hgb A1c: No results for input(s): HGBA1C in the last 72 hours. Lipid Profile: No results for input(s): CHOL, HDL, LDLCALC, TRIG, CHOLHDL, LDLDIRECT in the last 72 hours. Thyroid function studies: No results for input(s): TSH, T4TOTAL, T3FREE, THYROIDAB in the last 72 hours.  Invalid input(s): FREET3 Anemia work up: No results for input(s): VITAMINB12, FOLATE, FERRITIN, TIBC, IRON, RETICCTPCT in the last 72 hours. Sepsis  Labs: Recent Labs  Lab 02/04/19 0509 02/05/19 0415 02/06/19 0416 02/07/19 0401  WBC 2.5* 2.2* 2.1* 2.5*    Microbiology No results found for this or any previous visit (from the past 240 hour(s)).  Procedures and diagnostic studies:  No results found.  Medications:   . calcitonin (salmon)  1 spray Alternating Nares Daily  . Chlorhexidine Gluconate Cloth  6 each Topical Daily  . dronabinol  10 mg Oral BID  . feeding supplement  1 Container Oral TID BM  . feeding supplement (PRO-STAT SUGAR FREE 64)  30 mL Oral BID  . oxymetazoline  1 spray Each Nare BID  . sucralfate  1 g Oral TID AC & HS   Continuous Infusions: . sodium chloride 50 mL/hr at 02/06/19 2015     LOS: 26 days   Lillionna Nabi  [facility]   *Please refer to [email or web address], password [email or web address] to get updated schedule on who will round on this patient, as hospitalists switch teams weekly. If 7PM-7AM, please contact night-coverage at [email or web address], password [email or web address] for any overnight needs.  02/07/2019, 12:12 PM

## 2019-02-08 ENCOUNTER — Inpatient Hospital Stay (HOSPITAL_COMMUNITY): Payer: Medicare Other

## 2019-02-08 ENCOUNTER — Other Ambulatory Visit: Payer: Self-pay | Admitting: Hematology

## 2019-02-08 DIAGNOSIS — D708 Other neutropenia: Secondary | ICD-10-CM

## 2019-02-08 DIAGNOSIS — Z7189 Other specified counseling: Secondary | ICD-10-CM

## 2019-02-08 LAB — COMPREHENSIVE METABOLIC PANEL
ALT: 51 U/L — ABNORMAL HIGH (ref 0–44)
AST: 39 U/L (ref 15–41)
Albumin: 3 g/dL — ABNORMAL LOW (ref 3.5–5.0)
Alkaline Phosphatase: 340 U/L — ABNORMAL HIGH (ref 38–126)
Anion gap: 7 (ref 5–15)
BUN: 11 mg/dL (ref 8–23)
CO2: 28 mmol/L (ref 22–32)
Calcium: 14.2 mg/dL (ref 8.9–10.3)
Chloride: 96 mmol/L — ABNORMAL LOW (ref 98–111)
Creatinine, Ser: 0.54 mg/dL — ABNORMAL LOW (ref 0.61–1.24)
GFR calc Af Amer: >60 mL/min (ref >60)
GFR calc non Af Amer: >60 mL/min (ref >60)
Glucose, Bld: 118 mg/dL — ABNORMAL HIGH (ref 70–99)
Potassium: 3.4 mmol/L — ABNORMAL LOW (ref 3.5–5.1)
Sodium: 131 mmol/L — ABNORMAL LOW (ref 135–145)
Total Bilirubin: 0.9 mg/dL (ref 0.3–1.2)
Total Protein: 8.1 g/dL (ref 6.5–8.1)

## 2019-02-08 LAB — CBC WITH DIFFERENTIAL/PLATELET
Abs Immature Granulocytes: 0.05 10*3/uL (ref 0.00–0.07)
Basophils Absolute: 0 10*3/uL (ref 0.0–0.1)
Basophils Relative: 0 %
Eosinophils Absolute: 0 10*3/uL (ref 0.0–0.5)
Eosinophils Relative: 0 %
HCT: 25.1 % — ABNORMAL LOW (ref 39.0–52.0)
Hemoglobin: 8.3 g/dL — ABNORMAL LOW (ref 13.0–17.0)
Immature Granulocytes: 2 %
Lymphocytes Relative: 40 %
Lymphs Abs: 1.1 10*3/uL (ref 0.7–4.0)
MCH: 29.4 pg (ref 26.0–34.0)
MCHC: 33.1 g/dL (ref 30.0–36.0)
MCV: 89 fL (ref 80.0–100.0)
Monocytes Absolute: 0.4 10*3/uL (ref 0.1–1.0)
Monocytes Relative: 14 %
Neutro Abs: 1.2 10*3/uL — ABNORMAL LOW (ref 1.7–7.7)
Neutrophils Relative %: 44 %
Platelets: 9 10*3/uL — CL (ref 150–400)
RBC: 2.82 MIL/uL — ABNORMAL LOW (ref 4.22–5.81)
RDW: 17.3 % — ABNORMAL HIGH (ref 11.5–15.5)
WBC: 2.7 10*3/uL — ABNORMAL LOW (ref 4.0–10.5)
nRBC: 0 % (ref 0.0–0.2)

## 2019-02-08 LAB — TROPONIN I (HIGH SENSITIVITY)
Troponin I (High Sensitivity): 6 ng/L (ref <18)
Troponin I (High Sensitivity): 7 ng/L (ref <18)

## 2019-02-08 LAB — D-DIMER, QUANTITATIVE: D-Dimer, Quant: 5.76 ug/mL-FEU — ABNORMAL HIGH (ref 0.00–0.50)

## 2019-02-08 MED ORDER — SODIUM CHLORIDE 0.9 % IV BOLUS
500.0000 mL | Freq: Once | INTRAVENOUS | Status: AC
  Administered 2019-02-08: 11:00:00 500 mL via INTRAVENOUS

## 2019-02-08 MED ORDER — IOHEXOL 350 MG/ML SOLN
100.0000 mL | Freq: Once | INTRAVENOUS | Status: AC | PRN
  Administered 2019-02-08: 55 mL via INTRAVENOUS

## 2019-02-08 MED ORDER — POTASSIUM CHLORIDE IN NACL 20-0.9 MEQ/L-% IV SOLN
INTRAVENOUS | Status: DC
  Filled 2019-02-08 (×4): qty 1000

## 2019-02-08 MED ORDER — SODIUM CHLORIDE 0.9% IV SOLUTION: Freq: Once | INTRAVENOUS | Status: DC

## 2019-02-08 NOTE — Progress Notes (Signed)
   Vital Signs MEWS/VS Documentation      02/08/2019 1214 02/08/2019 1430 02/08/2019 1503 02/08/2019 1732   MEWS Score:  3  3  2  3    MEWS Score Color:  Yellow  Yellow  Yellow  Yellow   Resp:  (!) 24  (!) 24  20  (!) 24   Pulse:  (!) 118  (!) 120  (!) 119  (!) 116   BP:  116/73  117/71  112/73  135/65   Temp:  98.2 F (36.8 C)  (!) 97.5 F (36.4 C)  99.5 F (37.5 C)  98.4 F (36.9 C)   O2 Device:  Room Air  Room Air  Room Air  Room Air     Patient's chest pain is better after receiving PRN tylenol. Patient resting comfortably. Patient currently receiving 2 units of platelets. Continuing yellow MEWS guidelines      Lolita Rieger 02/08/2019,7:41 PM

## 2019-02-08 NOTE — Progress Notes (Signed)
Nutrition Follow-up  DOCUMENTATION CODES:   Underweight  INTERVENTION:   -Boost Breeze po TID, each supplement provides 250 kcal and 9 grams of protein (contains 10 mg of Ca+ per bottle) -Prostat liquid protein PO 30 ml BID with meals, each supplement provides 100 kcal, 15 grams protein.  NUTRITION DIAGNOSIS:   Increased nutrient needs related to cancer and cancer related treatments as evidenced by estimated needs.  Ongoing.  GOAL:   Patient will meet greater than or equal to 90% of their needs  Not meeting.  MONITOR:   PO intake, Supplement acceptance, Labs, Weight trends, I & O's  ASSESSMENT:   64 year old male with history of multiple myeloma, arthritis, cervical stenosis who presented with nosebleed.  He had a similar episode about 2 weeks ago which lasted for 8 hours but spontaneously resolved.  He follows with oncologist at [place] to have severe thrombocytopenia and presentation and also has normocytic anemia. Admitted for hypercalcemia.  Patient awaiting follow-up with Oncology. Palliative care to follow-up as well.  Last PO documented was 100% of breakfast on 1/5, provided ~600 kcals and 18g protein. Pt is drinking Boost Breeze and Prostat supplements.   Admission weight: 108 lbs. Current weight: 96 lbs.  I/Os: -29L since 12/23 UOP: 2.4L x 24 hrs  Medications: Marinol capsule, Carafate tablet Labs reviewed: Low Na, K Ca+ 14.2 (corrected 14.6)  Diet Order:   Diet Order            Diet regular Room service appropriate? Yes; Fluid consistency: Thin  Diet effective now              EDUCATION NEEDS:   Not appropriate for education at this time  Skin:  Skin Assessment: Reviewed RN Assessment  Last BM:  1/5  Height:   Ht Readings from Last 1 Encounters:  01/31/19 '5\' 6"'  (1.676 m)    Weight:   Wt Readings from Last 1 Encounters:  02/08/19 43.7 kg    Ideal Body Weight:  67.2 kg  BMI:  Body mass index is 15.55 kg/m.  Estimated  Nutritional Needs:   Kcal:  1550-1750  Protein:  75-85g  Fluid:  1.8L/day  Clayton Bibles, MS, RD, LDN Inpatient Clinical Dietitian Pager: [phone] After Hours Pager: [phone]

## 2019-02-08 NOTE — Progress Notes (Signed)
   Vital Signs MEWS/VS Documentation      02/07/2019 2000 02/07/2019 2038 02/08/2019 0702 02/08/2019 0718   MEWS Score:  1  0  2  2   MEWS Score Color:  Green  Green  Yellow  Yellow   Resp:  --  19  --  20   Pulse:  --  97  --  (!) 116   BP:  --  137/88  --  125/78   Temp:  --  98 F (36.7 C)  --  98.4 F (36.9 C)   O2 Device:  --  Room Air  --  Room Air     Patient alerted a yellow MEWS score of 2 due to heart rate being 116. Provider, Dr. Carles Collet notified. No new orders. Yellow MEWS guidelines initiated. Will continue to monitor patient for any changes.      Nonie Hoyer S 02/08/2019,9:16 AM

## 2019-02-08 NOTE — Progress Notes (Addendum)
PROGRESS NOTE  Jesus [name] PTW:[id number] DOB: [birth year] DOA: 01/11/2019 PCP: Patient, No Pcp Per  Brief History:  Jesus [name] is an 64 y.o. male with medical history significant for multiple myeloma, arthritis, cervical stenosis, who was admitted to the hospital on 01/11/2019 with nosebleed. He was found to have severe thrombocytopenia as well as anemia on admission and was transfused platelets. Heme-onc has been consulted. He was also have found profound hypercalcemia secondary to myeloma and nephrology has been consulted. Prolonged hospital course due to persistent hypercalcemia, pancytopenia. Oncology, nephrology and palliative care have all been consulted -Calcium remains refractory to therapy, has received numerous units of blood and platelets this admission,  -Status post numerous family meetings, patient wants therapy for Myeloma and now awaiting input from Dr. Irene Limbo on Monday   Assessment/Plan: Severe hypercalcemia of malignancy -Secondary to progressive multiple myeloma, status post 3 doses of Zometa, calcitonin, fluids, Lasix -Was also followed by nephrology, received last dose of Zometa on 12/26, nephrology eventually signed off as nothing further to offer  -Hypercalcemia remains refractory to above treatment with 3 doses of bisphosphonates, calcitonin, IV fluids.  He is off of Lasix -At this point, the recommendation is to continue treating his progressive multiple myeloma or Hospice -Await oncology input from Dr. Irene Limbo.  I called Dr. Irene Limbo on his cell phone at 1045 on 1/6--awaiting call back Follow-up with palliative care team. -restart IVF  Pancytopenia in the setting of multiple myeloma/myelodysplasia -Has received a total of8units of platelets thus far, last 2 units were on 02/08/19 -Status post 4 units PRBC so far -Oncology consulting, patient to be seen again today--I called Dr. Irene Limbo today -Underwent a bone marrow biopsy on 01/16/2019 which showed  persistent involvement by plasma cell myeloma as well as mild myeloid dysplasia. -Bone scan showed uptake over the right acetabulum mid to lower T-spine (ribs and likely cervicothoracic junction. Also showed recent C3 compression fractures status post corpectomy with C2-C4 plating -Remains extremely debilitated however motivated to be treated  Sinus tachycardia -due to acute medical illness and volume depletion from hypercalcemia -bolus NS -restart IVF  Chest pain -EKG -troponins -D-dimer  Acute kidney injury -Resolved  Hypokalemia, hypomagnesemia, hypophosphatemia -Replaced  Epistaxis -On admission, resolved, no further bleeding  Severe protein calorie malnutrition -Dietary following -Continue supplements as tolerated, unfortunately p.o. intake remains very poor  Acute metabolic encephalopathy -Likely multifactorial, improved and remains alert and oriented, appropriate. EEG showed generalized encephalopathy without evidence of epileptiform activities, CT head without acute changes  -more somnolent again as calcium gradually increasing again     Disposition Plan:   Home vs Hospice pending calcium improvement Family Communication:  No  Family at bedside  Consultants:  Med onc, palliative  Code Status:DNR  DVT Prophylaxis:  SCDs   Procedures: As Listed in Progress Note Above  Antibiotics: None       Subjective: Pt is drowsy but awakens.  Denies  sob, vomiting abd pain.  He complains of chest pain.  Objective: Vitals:   02/07/19 2038 02/08/19 0500 02/08/19 0718 02/08/19 0927  BP: 137/88  125/78 115/77  Pulse: 97  (!) 116 (!) 120  Resp: _0 Temp: 98 F (36.7 C)  98.4 F (36.9 C) 99.2 F (37.3 C)  TempSrc:   Oral Oral  SpO2: 100%  100% 99%  Weight:  43.7 kg    Height:        Intake/Output Summary (Last 24 hours)  at 02/08/2019 1050 Last data filed at 02/08/2019 0953 Gross per 24 hour  Intake --  Output 1800 ml  Net -1800 ml   Weight  change:  Exam:   General:  Pt is alert, does not follows commands appropriately, not in acute distress  HEENT: No icterus, No thrush, No neck mass, [place]/AT  Cardiovascular: RRR, S1/S2, no rubs, no gallops  Respiratory: bibasilar rales  Abdomen: Soft/+BS, non tender, non distended, no guarding  Extremities: No edema, No lymphangitis, No petechiae, No rashes, no synovitis   Data Reviewed: I have personally reviewed following labs and imaging studies Basic Metabolic Panel: Recent Labs  Lab 02/02/19 0441 02/03/19 0401 02/04/19 0509 02/05/19 0415 02/06/19 0416 02/07/19 0401 02/08/19 0251  NA 131*  132* 134* 131* 133* 132* 132* 131*  K 3.2*  3.2* 3.4* 3.1* 4.1 3.8 3.7 3.4*  CL 98  99 99 96* 103 101 100 96*  CO2 _0 GLUCOSE 103*  104* 105* 110* 104* 99 105* 118*  BUN _1 CREATININE 0.52*  0.44* 0.44* 0.61 0.57* 0.55* 0.52* 0.54*  CALCIUM 12.3*  12.3* 13.0* 12.7* 13.8* 13.9* 14.1* 14.2*  PHOS 2.6 2.1*  --   --   --   --   --    Liver Function Tests: Recent Labs  Lab 02/04/19 0509 02/05/19 0415 02/06/19 0416 02/07/19 0401 02/08/19 0251  AST 36 31 31 34 39  ALT 62* 51* 44 44 51*  ALKPHOS 360* 335* 310* 329* 340*  BILITOT 0.6 0.4 0.3 0.7 0.9  PROT 7.7 7.9 7.8 8.0 8.1  ALBUMIN 2.9* 2.9* 2.9* 2.9* 3.0*   No results for input(s): LIPASE, AMYLASE in the last 168 hours. No results for input(s): AMMONIA in the last 168 hours. Coagulation Profile: No results for input(s): INR, PROTIME in the last 168 hours. CBC: Recent Labs  Lab 02/04/19 0509 02/05/19 0415 02/06/19 0416 02/07/19 0401 02/08/19 0251  WBC 2.5* 2.2* 2.1* 2.5* 2.7*  NEUTROABS  --   --  1.0* 1.1* 1.2*  HGB 9.0* 8.8* 8.9* 8.7* 8.3*  HCT 27.1* 27.5* 27.3* 26.9* 25.1*  MCV 89.4 90.5 90.1 90.6 89.0  PLT 53* 38* 25* 17* 9*   Cardiac Enzymes: No results for input(s): CKTOTAL, CKMB, CKMBINDEX, TROPONINI in the last 168 hours. BNP: Invalid input(s):  POCBNP CBG: No results for input(s): GLUCAP in the last 168 hours. HbA1C: No results for input(s): HGBA1C in the last 72 hours. Urine analysis:    Component Value Date/Time   COLORURINE STRAW (A) 01/24/2019 1304   APPEARANCEUR CLEAR 01/24/2019 1304   LABSPEC 1.006 01/24/2019 1304   PHURINE 7.0 01/24/2019 1304   GLUCOSEU NEGATIVE 01/24/2019 1304   HGBUR NEGATIVE 01/24/2019 1304   BILIRUBINUR NEGATIVE 01/24/2019 1304   KETONESUR NEGATIVE 01/24/2019 1304   PROTEINUR NEGATIVE 01/24/2019 1304   NITRITE NEGATIVE 01/24/2019 1304   LEUKOCYTESUR SMALL (A) 01/24/2019 1304   Sepsis Labs: _2 (procalcitonin:4,lacticidven:4) )No results found for this or any previous visit (from the past 240 hour(s)).   Scheduled Meds: . sodium chloride   Intravenous Once  . calcitonin (salmon)  1 spray Alternating Nares Daily  . Chlorhexidine Gluconate Cloth  6 each Topical Daily  . dronabinol  10 mg Oral BID  . feeding supplement  1 Container Oral TID BM  . feeding supplement (PRO-STAT SUGAR FREE 64)  30 mL Oral BID  . oxymetazoline  1 spray Each Nare BID  .  sucralfate  1 g Oral TID AC & HS   Continuous Infusions: . sodium chloride      Procedures/Studies: CT HEAD WO CONTRAST  Result Date: 01/24/2019 CLINICAL DATA:  Delirium EXAM: CT HEAD WITHOUT CONTRAST TECHNIQUE: Contiguous axial images were obtained from the base of the skull through the vertex without intravenous contrast. COMPARISON:  None. FINDINGS: Brain: No evidence of acute infarction, hemorrhage, hydrocephalus, extra-axial collection or mass lesion/mass effect. Vascular: Negative for hyperdense vessel Skull: Negative Sinuses/Orbits: Paranasal sinuses clear. Negative orbit bilaterally. Bilateral cataract surgery. Other: None IMPRESSION: Negative CT head.  No acute abnormality Electronically Signed   By: Franchot Gallo M.D.   On: 01/24/2019 14:38   NM Bone Scan Whole Body  Result Date: 01/12/2019 CLINICAL DATA:  History of multiple  myeloma with acute back pain. EXAM: NUCLEAR MEDICINE WHOLE BODY BONE SCAN TECHNIQUE: Whole body anterior and posterior images were obtained approximately 3 hours after intravenous injection of radiopharmaceutical. RADIOPHARMACEUTICALS:  21.8 mCi Technetium-60mMDP IV COMPARISON:  PET-CT 10/13/2018 FINDINGS: Examination demonstrates patchy increased uptake over the mid to lower thoracic spine over known areas of fusion hardware and bone destruction representing patient's known multiple myeloma. Mild uptake over the right acetabulum likely representing patient's multiple myeloma. Minimal hazy uptake on the anterior image at the cervicothoracic junction which may represent patient's known myelomatous involvement of the cervical spine. Minimal patchy uptake over several anterior upper ribs bilaterally which may be due to patient's myomatous disease. Minimal degenerative changes over the shoulders and knees. IMPRESSION: 1. Uptake over the right acetabulum, mid to lower thoracic spine, upper anterior ribs and possibly cervicothoracic junction likely representing patient's known multiple myeloma. 2.  Degenerative changes as described. Electronically Signed   By: DMarin OlpM.D.   On: 01/12/2019 13:23   CT BIOPSY  Result Date: 01/16/2019 INDICATION: 64year old male with a history of multiple myeloma status post stem cell transplant and chemotherapy. He presents with pancytopenia and severe hypercalcemia. He requires bone marrow biopsy to evaluate for progression of disease. EXAM: CT GUIDED BONE MARROW ASPIRATION AND CORE BIOPSY Interventional Radiologist:  HCriselda Peaches MD MEDICATIONS: None. ANESTHESIA/SEDATION: Moderate (conscious) sedation was employed during this procedure. A total of 2 milligrams versed and 100 micrograms fentanyl were administered intravenously. The patient's level of consciousness and vital signs were monitored continuously by radiology nursing throughout the procedure under my direct  supervision. Total monitored sedation time: 10 minutes FLUOROSCOPY TIME:  None COMPLICATIONS: None immediate. Estimated blood loss: <25 mL PROCEDURE: Informed written consent was obtained from the patient after a thorough discussion of the procedural risks, benefits and alternatives. All questions were addressed. Maximal Sterile Barrier Technique was utilized including caps, mask, sterile gowns, sterile gloves, sterile drape, hand hygiene and skin antiseptic. A timeout was performed prior to the initiation of the procedure. The patient was positioned prone and non-contrast localization CT was performed of the pelvis to demonstrate the iliac marrow spaces. Maximal barrier sterile technique utilized including caps, mask, sterile gowns, sterile gloves, large sterile drape, hand hygiene, and betadine prep. Under sterile conditions and local anesthesia, an 11 gauge coaxial bone biopsy needle was advanced into the right iliac marrow space. Needle position was confirmed with CT imaging. Initially, bone marrow aspiration was performed. Next, the 11 gauge outer cannula was utilized to obtain a right iliac bone marrow core biopsy. Needle was removed. Hemostasis was obtained with compression. The patient tolerated the procedure well. Samples were prepared with the cytotechnologist. IMPRESSION: Technically successful CT-guided bone marrow biopsy and  aspiration. Signed, Criselda Peaches, MD, [facility] Electronically Signed   By: Jacqulynn Cadet M.D.   On: 01/16/2019 10:58   DG Swallowing Func-Speech Pathology  Result Date: 01/16/2019 Objective Swallowing Evaluation: Type of Study: Bedside Swallow Evaluation  Patient Details Name: VIRGLE ARTH MRN: [id number] Date of Birth: February 16, 1955 Today's Date: 01/16/2019 Time: SLP Start Time (ACUTE ONLY): 1305 -SLP Stop Time (ACUTE ONLY): 1335 SLP Time Calculation (min) (ACUTE ONLY): 30 min Past Medical History:  Past Medical History: Diagnosis Date . Arthritis  . Cancer ([place])   multiple myeloma . Cervical stenosis of spinal canal  . Wears glasses  Past Surgical History: Past Surgical History: Procedure Laterality Date . ANTERIOR CERVICAL CORPECTOMY N/A 11/04/2018  Procedure: Cervical three Corpectomy with Cervical two to Cervical four Plating;  Surgeon: Erline Levine, MD;  Location: [place];  Service: Neurosurgery;  Laterality: N/A; . APPENDECTOMY   . BACK SURGERY   . EYE SURGERY Bilateral   cataract surgery with lens implants . HERNIA REPAIR Left   inguinal  . IR IMAGING GUIDED PORT INSERTION  06/21/2018 . ROTATOR CUFF REPAIR Left  HPI: Patient is a 64 y.o. male with PMH: multiple myeloma, arthritis, cervical stenosis, who presented to hospital with nosebleed which was not able to be controlled at home despite attempts with pressure, paper towels and ice. He had similar episodes two weeks prior where bleeding lasted 8 hours before it finally stopped. Patient has had poor appetite for past couple weeks. Wife reported he seemed more confused in AM of admission. on 12/11, RN reported patient with significant difficulty with swallowing and unable to take medications and so SLP BSE ordered.  Pt underwent BSE on 01/14/2019 and was found to be too high risk for aspiration with recommendation for npo.  Subjective: pt awake in chair in flouro suite Assessment / Plan / Recommendation CHL IP CLINICAL IMPRESSIONS 01/16/2019 Clinical Impression Pt presents with minimal oropharyngeal dysphagia without aspiration or laryngeal penetration of any consistency tested.  In general, pt's swallow is strong and timely with adequate airway protection even with sequential liquid bolus swallows.  He did demonstrate difficulty orally transiting tablet with thin barium - and after 3 unsuccesful attempts, pudding effective to transit.  In addition, decreased UES relaxation noted when swallowing masticated cracker which results in residuals without pt  awareness.  Liquid swallow faciliated clearance but also resulted in trace backflow, thus recommend strict esophageal precautions.  Given pt's generalized weakness and decreased UES clearance with solids (not sensate) - dys3 diet advised at this time.  Medicine with puree- whole - starting and following with liquids.  Of note, pt did NOT cough during entire MBS - symptoms of cough with liquids reported prior to admission and during hospitalization.  SLP educated pt and his wife (present for test) to findings/recommendations.  Will follow up for dysphagia management/treatment. Anticipate pt will benefit from dysphagia treatment given his XRT to pharyngeal region. SLP Visit Diagnosis Dysphagia, oropharyngeal phase (R13.12) Attention and concentration deficit following -- Frontal lobe and executive function deficit following -- Impact on safety and function Mild aspiration risk   CHL IP TREATMENT RECOMMENDATION 01/16/2019 Treatment Recommendations Therapy as outlined in treatment plan below   Prognosis 01/16/2019 Prognosis for Safe Diet Advancement Good Barriers to Reach Goals -- Barriers/Prognosis Comment -- CHL IP DIET RECOMMENDATION 01/16/2019 SLP Diet Recommendations Dysphagia 3 (Mech soft) solids;Thin liquid Liquid Administration via Cup;Straw Medication Administration Whole meds with puree Compensations Small sips/bites;Slow rate;Effortful swallow;Minimize environmental  distractions Postural Changes --   CHL IP OTHER RECOMMENDATIONS 01/16/2019 Recommended Consults -- Oral Care Recommendations Oral care BID Other Recommendations Clarify dietary restrictions   CHL IP FOLLOW UP RECOMMENDATIONS 01/16/2019 Follow up Recommendations Skilled Nursing facility   [facility] IP FREQUENCY AND DURATION 01/16/2019 Speech Therapy Frequency (ACUTE ONLY) min 2x/week Treatment Duration 2 weeks      CHL IP ORAL PHASE 01/16/2019 Oral Phase Impaired Oral - Pudding Teaspoon -- Oral - Pudding Cup -- Oral - Honey Teaspoon -- Oral - Honey Cup  -- Oral - Nectar Teaspoon -- Oral - Nectar Cup WFL Oral - Nectar Straw -- Oral - Thin Teaspoon -- Oral - Thin Cup WFL Oral - Thin Straw WFL Oral - Puree WFL Oral - Mech Soft WFL Oral - Regular -- Oral - Multi-Consistency -- Oral - Pill Reduced posterior propulsion;Weak lingual manipulation Oral Phase - Comment pt needed pudding to orally transit tablet after unable x3 with liquids  CHL IP PHARYNGEAL PHASE 01/16/2019 Pharyngeal Phase WFL;Impaired Pharyngeal- Pudding Teaspoon -- Pharyngeal -- Pharyngeal- Pudding Cup -- Pharyngeal -- Pharyngeal- Honey Teaspoon -- Pharyngeal -- Pharyngeal- Honey Cup -- Pharyngeal -- Pharyngeal- Nectar Teaspoon -- Pharyngeal -- Pharyngeal- Nectar Cup [facility] Pharyngeal Material does not enter airway Pharyngeal- Nectar Straw -- Pharyngeal -- Pharyngeal- Thin Teaspoon -- Pharyngeal -- Pharyngeal- Thin Cup [facility] Pharyngeal Material does not enter airway Pharyngeal- Thin Straw WFL Pharyngeal Material does not enter airway Pharyngeal- Puree WFL Pharyngeal Material does not enter airway Pharyngeal- Mechanical Soft Pharyngeal residue - cp segment Pharyngeal Material does not enter airway Pharyngeal- Regular NT Pharyngeal -- Pharyngeal- Multi-consistency -- Pharyngeal -- Pharyngeal- Pill WFL Pharyngeal Material does not enter airway Pharyngeal Comment --  CHL IP CERVICAL ESOPHAGEAL PHASE 01/16/2019 Cervical Esophageal Phase Impaired Pudding Teaspoon -- Pudding Cup -- Honey Teaspoon -- Honey Cup -- Nectar Teaspoon -- Nectar Cup WFL Nectar Straw -- Thin Teaspoon -- Thin Cup WFL Thin Straw Reduced cricopharyngeal relaxation;Esophageal backflow into cervical esophagus Puree WFL Mechanical Soft Reduced cricopharyngeal relaxation;Esophageal backflow into cervical esophagus Regular -- Multi-consistency -- Pill WFL Cervical Esophageal Comment pt without sensation to minimal residuals at pyriform/above UES region, liquid assisted clearance but also resulted in trace backflow, barium tablet swallowed with  pudding appeared to transit into sttomach without delay Kathleen Lime, MS [facility] SLP Acute Rehab Services Office [phone] [name] 01/16/2019, 2:19 PM              EEG adult  Result Date: 01/24/2019 Lora Havens, MD     01/24/2019  6:58 PM Patient Name: ANDREUS CURE MRN: [id number] Epilepsy Attending: Lora Havens Referring Physician/Provider: Dr Jacki Cones Date: 01/24/2019 Duration: 22.09 mins Patient history: 64yo M with ams and jerking. EEG to evaluate for seizure Level of alertness: awake AEDs during EEG study: Lorazepam Technical aspects: This EEG study was done with scalp electrodes positioned according to the 10-20 International system of electrode placement. Electrical activity was acquired at a sampling rate of _0  and reviewed with a high frequency filter of _1  and a low frequency filter of _2 . EEG data were recorded continuously and digitally stored. DESCRIPTION: No clear posterior dominant rhythm was seen. EEG showed continuous generalized 3-_3  theta-delta slowing. Hyperventilation and photic stimulation were not performed. ABNORMALITY - Continuous slow, generalized IMPRESSION: This study is suggestive of moderate diffuse encephalopathy, non specific to etiology. No seizures or epileptiform discharges were seen throughout the recording. Priyanka Barbra Sarks   CT BONE MARROW BIOPSY & ASPIRATION  Result Date: 01/16/2019 INDICATION: 64 year old  male with a history of multiple myeloma status post stem cell transplant and chemotherapy. He presents with pancytopenia and severe hypercalcemia. He requires bone marrow biopsy to evaluate for progression of disease. EXAM: CT GUIDED BONE MARROW ASPIRATION AND CORE BIOPSY Interventional Radiologist:  Criselda Peaches, MD MEDICATIONS: None. ANESTHESIA/SEDATION: Moderate (conscious) sedation was employed during this procedure. A total of 2 milligrams versed and 100 micrograms fentanyl were administered intravenously. The  patient's level of consciousness and vital signs were monitored continuously by radiology nursing throughout the procedure under my direct supervision. Total monitored sedation time: 10 minutes FLUOROSCOPY TIME:  None COMPLICATIONS: None immediate. Estimated blood loss: <25 mL PROCEDURE: Informed written consent was obtained from the patient after a thorough discussion of the procedural risks, benefits and alternatives. All questions were addressed. Maximal Sterile Barrier Technique was utilized including caps, mask, sterile gowns, sterile gloves, sterile drape, hand hygiene and skin antiseptic. A timeout was performed prior to the initiation of the procedure. The patient was positioned prone and non-contrast localization CT was performed of the pelvis to demonstrate the iliac marrow spaces. Maximal barrier sterile technique utilized including caps, mask, sterile gowns, sterile gloves, large sterile drape, hand hygiene, and betadine prep. Under sterile conditions and local anesthesia, an 11 gauge coaxial bone biopsy needle was advanced into the right iliac marrow space. Needle position was confirmed with CT imaging. Initially, bone marrow aspiration was performed. Next, the 11 gauge outer cannula was utilized to obtain a right iliac bone marrow core biopsy. Needle was removed. Hemostasis was obtained with compression. The patient tolerated the procedure well. Samples were prepared with the cytotechnologist. IMPRESSION: Technically successful CT-guided bone marrow biopsy and aspiration. Signed, Criselda Peaches, MD, [place] [facility] Radiology Electronically Signed   By: Jacqulynn Cadet M.D.   On: 01/16/2019 10:58    Orson Eva, DO  [facility] Pager [phone]  If 7PM-7AM, please contact night-coverage [email or web address] Password [email or web address] 02/08/2019, 10:50 AM   LOS: 27 days

## 2019-02-08 NOTE — Progress Notes (Signed)
OT Cancellation Note  Patient Details Name: Jesus [name] MRN: [id number] DOB: Jan 16, 1956   Cancelled Treatment:    Reason Eval/Treat Not Completed: Medical issues which prohibited therapy.  Pt's platelets 9.  Will check back another day  Jesus [name] 02/08/2019, 9:37 AM  Karsten Ro, OTR/L Acute Rehabilitation Services 02/08/2019

## 2019-02-08 NOTE — Progress Notes (Addendum)
   Vital Signs MEWS/VS Documentation      02/08/2019 1117 02/08/2019 1117 02/08/2019 1144 02/08/2019 1214   MEWS Score:  2  2  3  3    MEWS Score Color:  Yellow  Yellow  Yellow  Yellow   Resp:  --  17  (!) 24  (!) 24   Pulse:  --  (!) 114  (!) 118  (!) 118   BP:  --  116/78  116/78  116/73   Temp:  --  98.5 F (36.9 C)  98.3 F (36.8 C)  98.2 F (36.8 C)   O2 Device:  --  Room Air  Room Air  Room Air   Level of Consciousness:  Alert  --  --  --     Patient now complaining of chest pain 5/10, describing pain as pressure. MD, Dr. Carles Collet notified. EKG obtained, patient in sinus tachycardia. New orders obtained for D-dimer and Troponin x2. IV team notified to collect blood draw. Will continue with yellow MEWS guidelines and monitor patient.       Nonie Hoyer S 02/08/2019,12:17 PM

## 2019-02-08 NOTE — Progress Notes (Signed)
Patient had several episodes of epistaxis last night. MD notified and orders given. Comfort measures addressed/met. Patient had increase anxiety and concern w/ bowel movement. PRN given.   Critical lab ca-14.2 this morning - MD notifed.

## 2019-02-09 LAB — BPAM PLATELET PHERESIS
Blood Product Expiration Date: 202101072359
Blood Product Expiration Date: 202101082359
ISSUE DATE / TIME: 202101061153
ISSUE DATE / TIME: 202101061437
Unit Type and Rh: 6200
Unit Type and Rh: 6200

## 2019-02-09 LAB — COMPREHENSIVE METABOLIC PANEL
ALT: 48 U/L — ABNORMAL HIGH (ref 0–44)
AST: 39 U/L (ref 15–41)
Albumin: 2.9 g/dL — ABNORMAL LOW (ref 3.5–5.0)
Alkaline Phosphatase: 303 U/L — ABNORMAL HIGH (ref 38–126)
Anion gap: 6 (ref 5–15)
BUN: 18 mg/dL (ref 8–23)
CO2: 29 mmol/L (ref 22–32)
Calcium: 13.1 mg/dL (ref 8.9–10.3)
Chloride: 101 mmol/L (ref 98–111)
Creatinine, Ser: 0.42 mg/dL — ABNORMAL LOW (ref 0.61–1.24)
GFR calc Af Amer: >60 mL/min (ref >60)
GFR calc non Af Amer: >60 mL/min (ref >60)
Glucose, Bld: 121 mg/dL — ABNORMAL HIGH (ref 70–99)
Potassium: 3.4 mmol/L — ABNORMAL LOW (ref 3.5–5.1)
Sodium: 136 mmol/L (ref 135–145)
Total Bilirubin: 0.8 mg/dL (ref 0.3–1.2)
Total Protein: 7.5 g/dL (ref 6.5–8.1)

## 2019-02-09 LAB — PREPARE PLATELET PHERESIS
Unit division: 0
Unit division: 0

## 2019-02-09 LAB — CBC WITH DIFFERENTIAL/PLATELET
Abs Immature Granulocytes: 0.07 10*3/uL (ref 0.00–0.07)
Basophils Absolute: 0 10*3/uL (ref 0.0–0.1)
Basophils Relative: 0 %
Eosinophils Absolute: 0 10*3/uL (ref 0.0–0.5)
Eosinophils Relative: 0 %
HCT: 19.8 % — ABNORMAL LOW (ref 39.0–52.0)
Hemoglobin: 6.4 g/dL — CL (ref 13.0–17.0)
Immature Granulocytes: 3 %
Lymphocytes Relative: 39 %
Lymphs Abs: 1 10*3/uL (ref 0.7–4.0)
MCH: 29.4 pg (ref 26.0–34.0)
MCHC: 32.3 g/dL (ref 30.0–36.0)
MCV: 90.8 fL (ref 80.0–100.0)
Monocytes Absolute: 0.4 10*3/uL (ref 0.1–1.0)
Monocytes Relative: 14 %
Neutro Abs: 1.2 10*3/uL — ABNORMAL LOW (ref 1.7–7.7)
Neutrophils Relative %: 44 %
Platelets: 109 10*3/uL — ABNORMAL LOW (ref 150–400)
RBC: 2.18 MIL/uL — ABNORMAL LOW (ref 4.22–5.81)
RDW: 17.6 % — ABNORMAL HIGH (ref 11.5–15.5)
WBC: 2.6 10*3/uL — ABNORMAL LOW (ref 4.0–10.5)
nRBC: 0 % (ref 0.0–0.2)

## 2019-02-09 LAB — PREPARE RBC (CROSSMATCH)

## 2019-02-09 MED ORDER — SODIUM CHLORIDE 0.9% IV SOLUTION: Freq: Once | INTRAVENOUS | Status: AC

## 2019-02-09 MED ORDER — POLYETHYLENE GLYCOL 3350 17 G PO PACK
17.0000 g | PACK | Freq: Every day | ORAL | Status: DC
  Administered 2019-02-10: 17 g via ORAL
  Filled 2019-02-09: qty 1

## 2019-02-09 MED ORDER — OXYCODONE HCL 5 MG PO TABS
15.0000 mg | ORAL_TABLET | ORAL | Status: DC | PRN
  Administered 2019-02-09: 15 mg via ORAL
  Filled 2019-02-09: qty 3

## 2019-02-09 MED ORDER — SENNOSIDES-DOCUSATE SODIUM 8.6-50 MG PO TABS
2.0000 | ORAL_TABLET | Freq: Two times a day (BID) | ORAL | Status: DC
  Administered 2019-02-09 – 2019-02-10 (×2): 2 via ORAL
  Filled 2019-02-09 (×2): qty 2

## 2019-02-09 MED ORDER — LORAZEPAM 2 MG/ML IJ SOLN: 0.5000 mg | INTRAMUSCULAR | Status: DC | PRN

## 2019-02-09 MED ORDER — HYDROMORPHONE HCL 1 MG/ML IJ SOLN: 0.5000 mg | INTRAMUSCULAR | Status: DC | PRN

## 2019-02-09 NOTE — Progress Notes (Signed)
PT Cancellation Note  Patient Details Name: Jesus [name] MRN: [id number] DOB: May 16, 1955   Cancelled Treatment:    Reason Eval/Treat Not Completed: Medical issues which prohibited therapy(Hgb 6.4. Will follow.)   Philomena Doheny PT 02/09/2019  Acute Rehabilitation Services Pager [phone] Office [phone]

## 2019-02-09 NOTE — Progress Notes (Signed)
OT Cancellation Note  Patient Details Name: Jesus [name] MRN: [id number] DOB: 02/10/1955   Cancelled Treatment:    Reason Eval/Treat Not Completed: Medical issues which prohibited therapy.  Pt's Hgb 6.4.  Will check back tomorrow if schedule permits  [name] 02/09/2019, 8:05 AM  Karsten Ro, OTR/L Acute Rehabilitation Services 02/09/2019

## 2019-02-09 NOTE — Progress Notes (Signed)
PROGRESS NOTE  Jesus [name] RAQ:[id number] DOB: March 23, 1955 DOA: 01/11/2019 PCP: Patient, No Pcp Per  Brief History:  Jesus [name] is an 64 y.o. male with medical history significant for multiple myeloma, arthritis, cervical stenosis, who was admitted to the hospital on 01/11/2019 with nosebleed. He was found to have severe thrombocytopenia as well as anemia on admission and was transfused platelets. Heme-onc has been consulted. He was also have found profound hypercalcemia secondary to myeloma and nephrology has been consulted. Prolonged hospital course due to persistent hypercalcemia, pancytopenia. Oncology, nephrology and palliative care have all been consulted -Calcium remains refractory to therapy, has received numerous units of blood and platelets this admission,  -Status post numerous family meetings, patient wants therapy for Myeloma and now awaiting input from Dr. Irene Limbo on Monday   Assessment/Plan: Severe hypercalcemia of malignancy -Secondary to progressive multiple myeloma, status post 3 doses of Zometa, calcitonin, fluids, Lasix -Was also followed by nephrology, received last dose of Zometa on 12/26, nephrology eventually signed off as nothing further to offer  -Hypercalcemia remains refractory to above treatment with 3 doses of bisphosphonates, calcitonin, IV fluids.  He is off of Lasix -At this point, the recommendation is to continue treating his progressive multiple myeloma or Hospice -Oncology Dr. Irene Limbo.    Appreciate their assistance. Follow-up with palliative care team.  02/09/2019 Goals of care discussion Spent 20 minutes with wife and patient.  After discussing yesterday with Dr. Irene Limbo family wants to transition to home with hospice. Explained the rationale for hospice. Wife had some concerns regarding possibility of bleeding at home as well as what to do if the calcium gets worse at home. Explained The trajectory of the disease of the illness.  And  options available with hospice. Social worker consulted for hospice arrangements. Likely ready for discharge on 02/10/2019 once equipments are delivered.  Pancytopenia in the setting of multiple myeloma/myelodysplasia -Has received a total of8units of platelets thus far, last 2 units were on 02/08/19 -Status post 4 units PRBC so far -Oncology consulting, patient to be seen again today--I called Dr. Irene Limbo today -Underwent a bone marrow biopsy on 01/16/2019 which showed persistent involvement by plasma cell myeloma as well as mild myeloid dysplasia. -Bone scan showed uptake over the right acetabulum mid to lower T-spine (ribs and likely cervicothoracic junction. Also showed recent C3 compression fractures status post corpectomy with C2-C4 plating -Remains extremely debilitated however motivated to be treated  Sinus tachycardia -due to acute medical illness and volume depletion from hypercalcemia -bolus NS -restart IVF  Chest pain -EKG -troponins -D-dimer  Acute kidney injury -Resolved  Hypokalemia, hypomagnesemia, hypophosphatemia -Replaced  Epistaxis -On admission, resolved, no further bleeding  Severe protein calorie malnutrition -Dietary following -Continue supplements as tolerated, unfortunately p.o. intake remains very poor  Acute metabolic encephalopathy -Likely multifactorial, improved and remains alert and oriented, appropriate. EEG showed generalized encephalopathy without evidence of epileptiform activities, CT head without acute changes  -more somnolent again as calcium gradually increasing again     Disposition Plan:   Home with Hospice pending delivery of the equipments Family Communication: Wife family at bedside  Consultants:  Med onc, palliative  Code Status:DNR  DVT Prophylaxis:  SCDs   Procedures: As Listed in Progress Note Above  Antibiotics: None       Subjective: Tired.  Response to simple questions.  Not able to follow commands.   No nausea no vomiting.  Minimal oral intake reported by wife.  Objective: Vitals:   02/08/19 2331 02/09/19 0333 02/09/19 0511 02/09/19 1408  BP: 113/71 113/63 115/71 128/77  Pulse: (!) 111 (!) 110 (!) 108 (!) 101  Resp: _0 Temp: 98.4 F (36.9 C) 98.8 F (37.1 C) 98.3 F (36.8 C) 98.7 F (37.1 C)  TempSrc: Oral Oral Oral Oral  SpO2: 100% 100% 99% 98%  Weight:   45 kg   Height:        Intake/Output Summary (Last 24 hours) at 02/09/2019 1651 Last data filed at 02/09/2019 1515 Gross per 24 hour  Intake 2913.38 ml  Output 5325 ml  Net -2411.62 ml   Weight change: 1.3 kg Exam:   General:  Pt is alert, does not follows commands appropriately, not in acute distress  HEENT: No icterus, No thrush, No neck mass, [place]/AT  Cardiovascular: RRR, S1/S2, no rubs, no gallops  Respiratory: bibasilar rales  Abdomen: Soft/+BS, non tender, non distended, no guarding  Extremities: No edema, No lymphangitis, No petechiae, No rashes, no synovitis   Data Reviewed: I have personally reviewed following labs and imaging studies Basic Metabolic Panel: Recent Labs  Lab 02/03/19 0401 02/05/19 0415 02/06/19 0416 02/07/19 0401 02/08/19 0251 02/09/19 0343  NA 134* 133* 132* 132* 131* 136  K 3.4* 4.1 3.8 3.7 3.4* 3.4*  CL 99 103 101 100 96* 101  CO2 _1 GLUCOSE 105* 104* 99 105* 118* 121*  BUN _2 CREATININE 0.44* 0.57* 0.55* 0.52* 0.54* 0.42*  CALCIUM 13.0* 13.8* 13.9* 14.1* 14.2* 13.1*  PHOS 2.1*  --   --   --   --   --    Liver Function Tests: Recent Labs  Lab 02/05/19 0415 02/06/19 0416 02/07/19 0401 02/08/19 0251 02/09/19 0343  AST 31 31 34 39 39  ALT 51* 44 44 51* 48*  ALKPHOS 335* 310* 329* 340* 303*  BILITOT 0.4 0.3 0.7 0.9 0.8  PROT 7.9 7.8 8.0 8.1 7.5  ALBUMIN 2.9* 2.9* 2.9* 3.0* 2.9*   No results for input(s): LIPASE, AMYLASE in the last 168 hours. No results for input(s): AMMONIA in the last 168 hours. Coagulation Profile: No  results for input(s): INR, PROTIME in the last 168 hours. CBC: Recent Labs  Lab 02/05/19 0415 02/06/19 0416 02/07/19 0401 02/08/19 0251 02/09/19 0343  WBC 2.2* 2.1* 2.5* 2.7* 2.6*  NEUTROABS  --  1.0* 1.1* 1.2* 1.2*  HGB 8.8* 8.9* 8.7* 8.3* 6.4*  HCT 27.5* 27.3* 26.9* 25.1* 19.8*  MCV 90.5 90.1 90.6 89.0 90.8  PLT 38* 25* 17* 9* 109*   Cardiac Enzymes: No results for input(s): CKTOTAL, CKMB, CKMBINDEX, TROPONINI in the last 168 hours. BNP: Invalid input(s): POCBNP CBG: No results for input(s): GLUCAP in the last 168 hours. HbA1C: No results for input(s): HGBA1C in the last 72 hours. Urine analysis:    Component Value Date/Time   COLORURINE STRAW (A) 01/24/2019 1304   APPEARANCEUR CLEAR 01/24/2019 1304   LABSPEC 1.006 01/24/2019 1304   PHURINE 7.0 01/24/2019 1304   GLUCOSEU NEGATIVE 01/24/2019 1304   HGBUR NEGATIVE 01/24/2019 1304   BILIRUBINUR NEGATIVE 01/24/2019 1304   KETONESUR NEGATIVE 01/24/2019 1304   PROTEINUR NEGATIVE 01/24/2019 1304   NITRITE NEGATIVE 01/24/2019 1304   LEUKOCYTESUR SMALL (A) 01/24/2019 1304   Sepsis Labs: _3 (procalcitonin:4,lacticidven:4) )No results found for this or any previous visit (from the past 240 hour(s)).   Scheduled Meds: . sodium chloride   Intravenous Once  . sodium chloride  Intravenous Once  . calcitonin (salmon)  1 spray Alternating Nares Daily  . Chlorhexidine Gluconate Cloth  6 each Topical Daily  . dronabinol  10 mg Oral BID  . feeding supplement  1 Container Oral TID BM  . feeding supplement (PRO-STAT SUGAR FREE 64)  30 mL Oral BID  . oxymetazoline  1 spray Each Nare BID  . [START ON 02/10/2019] polyethylene glycol  17 g Oral Daily  . senna-docusate  2 tablet Oral BID  . sucralfate  1 g Oral TID AC & HS   Continuous Infusions: . 0.9 % NaCl with KCl 20 mEq / L 75 mL/hr at 02/09/19 5638    Procedures/Studies: CT HEAD WO CONTRAST  Result Date: 01/24/2019 CLINICAL DATA:  Delirium EXAM: CT HEAD WITHOUT  CONTRAST TECHNIQUE: Contiguous axial images were obtained from the base of the skull through the vertex without intravenous contrast. COMPARISON:  None. FINDINGS: Brain: No evidence of acute infarction, hemorrhage, hydrocephalus, extra-axial collection or mass lesion/mass effect. Vascular: Negative for hyperdense vessel Skull: Negative Sinuses/Orbits: Paranasal sinuses clear. Negative orbit bilaterally. Bilateral cataract surgery. Other: None IMPRESSION: Negative CT head.  No acute abnormality Electronically Signed   By: Franchot Gallo M.D.   On: 01/24/2019 14:38   CT ANGIO CHEST PE W OR WO CONTRAST  Result Date: 02/08/2019 CLINICAL DATA:  Shortness of breath with history of multiple myeloma. Elevated D-dimer. Severe thrombocytopenia and anemia. EXAM: CT ANGIOGRAPHY CHEST WITH CONTRAST TECHNIQUE: Multidetector CT imaging of the chest was performed using the standard protocol during bolus administration of intravenous contrast. Multiplanar CT image reconstructions and MIPs were obtained to evaluate the vascular anatomy. CONTRAST:  70m OMNIPAQUE IOHEXOL 350 MG/ML SOLN COMPARISON:  None. FINDINGS: Cardiovascular: Evaluation for pulmonary emboli is limited by significant respiratory motion artifact.Given this limitation, no large centrally located pulmonary embolism was detected. Detection of smaller pulmonary emboli is limited. The main pulmonary artery is within normal limits for size. There is no CT evidence of acute right heart strain. There is no evidence for thoracic aortic aneurysm or dissection. Aortic calcifications are noted. Heart size is normal. There is no significant pericardial effusion. There is a well-positioned right-sided Port-A-Cath in place. Mediastinum/Nodes: --No mediastinal or hilar lymphadenopathy. --No axillary lymphadenopathy. --No supraclavicular lymphadenopathy. --Normal thyroid gland. --The esophagus is unremarkable Lungs/Pleura: There is no pneumothorax. No large pleural effusion.  Atelectasis is noted at the lung bases, left worse than right. The trachea is unremarkable. Upper Abdomen: No acute abnormality. Musculoskeletal: There are extensive lytic lesions scattered throughout the visualized osseous structures. Multiple lytic soft tissue masses are noted involving the ribs, greatest on the left. There is age-indeterminate height loss of the T5 vertebral body. The patient is status post posterior fusion of the mid to lower thoracic spine. A large lytic lesion is noted in the T10 vertebral body. The hardware appears grossly intact. Review of the MIP images confirms the above findings. IMPRESSION: 1. Evaluation for pulmonary emboli is limited by respiratory motion artifact. Given this limitation, no PE was identified. 2. Bibasilar atelectasis, otherwise the lungs are essentially clear. 3. Extensive lytic lesions involving the osseous structures with multiple lytic soft tissue masses involving the ribs is consistent with the patient's history of multiple myeloma Aortic Atherosclerosis (ICD10-I70.0). Electronically Signed   By: CConstance HolsterM.D.   On: 02/08/2019 19:54   NM Bone Scan Whole Body  Result Date: 01/12/2019 CLINICAL DATA:  History of multiple myeloma with acute back pain. EXAM: NUCLEAR MEDICINE WHOLE BODY BONE SCAN TECHNIQUE: Whole body  anterior and posterior images were obtained approximately 3 hours after intravenous injection of radiopharmaceutical. RADIOPHARMACEUTICALS:  21.8 mCi Technetium-44mMDP IV COMPARISON:  PET-CT 10/13/2018 FINDINGS: Examination demonstrates patchy increased uptake over the mid to lower thoracic spine over known areas of fusion hardware and bone destruction representing patient's known multiple myeloma. Mild uptake over the right acetabulum likely representing patient's multiple myeloma. Minimal hazy uptake on the anterior image at the cervicothoracic junction which may represent patient's known myelomatous involvement of the cervical spine.  Minimal patchy uptake over several anterior upper ribs bilaterally which may be due to patient's myomatous disease. Minimal degenerative changes over the shoulders and knees. IMPRESSION: 1. Uptake over the right acetabulum, mid to lower thoracic spine, upper anterior ribs and possibly cervicothoracic junction likely representing patient's known multiple myeloma. 2.  Degenerative changes as described. Electronically Signed   By: DMarin OlpM.D.   On: 01/12/2019 13:23   CT BIOPSY  Result Date: 01/16/2019 INDICATION: 64year old male with a history of multiple myeloma status post stem cell transplant and chemotherapy. He presents with pancytopenia and severe hypercalcemia. He requires bone marrow biopsy to evaluate for progression of disease. EXAM: CT GUIDED BONE MARROW ASPIRATION AND CORE BIOPSY Interventional Radiologist:  HCriselda Peaches MD MEDICATIONS: None. ANESTHESIA/SEDATION: Moderate (conscious) sedation was employed during this procedure. A total of 2 milligrams versed and 100 micrograms fentanyl were administered intravenously. The patient's level of consciousness and vital signs were monitored continuously by radiology nursing throughout the procedure under my direct supervision. Total monitored sedation time: 10 minutes FLUOROSCOPY TIME:  None COMPLICATIONS: None immediate. Estimated blood loss: <25 mL PROCEDURE: Informed written consent was obtained from the patient after a thorough discussion of the procedural risks, benefits and alternatives. All questions were addressed. Maximal Sterile Barrier Technique was utilized including caps, mask, sterile gowns, sterile gloves, sterile drape, hand hygiene and skin antiseptic. A timeout was performed prior to the initiation of the procedure. The patient was positioned prone and non-contrast localization CT was performed of the pelvis to demonstrate the iliac marrow spaces. Maximal barrier sterile technique utilized including caps, mask, sterile  gowns, sterile gloves, large sterile drape, hand hygiene, and betadine prep. Under sterile conditions and local anesthesia, an 11 gauge coaxial bone biopsy needle was advanced into the right iliac marrow space. Needle position was confirmed with CT imaging. Initially, bone marrow aspiration was performed. Next, the 11 gauge outer cannula was utilized to obtain a right iliac bone marrow core biopsy. Needle was removed. Hemostasis was obtained with compression. The patient tolerated the procedure well. Samples were prepared with the cytotechnologist. IMPRESSION: Technically successful CT-guided bone marrow biopsy and aspiration. Signed, HCriselda Peaches MD, [facility] Hospital[facility] Electronically Signed   By: HJacqulynn CadetM.D.   On: 01/16/2019 10:58   DG Swallowing Func-Speech Pathology  Result Date: 01/16/2019 Objective Swallowing Evaluation: Type of Study: Bedside Swallow Evaluation  Patient Details Name: Jesus KRETSCHMERMRN: [id number] of Birth: [id number]'s Date: 01/16/2019 Time: SLP Start Time (ACUTE ONLY): 1305 -SLP Stop Time (ACUTE ONLY): 1335 SLP Time Calculation (min) (ACUTE ONLY): 30 min Past Medical History: Past Medical History: Diagnosis Date . Arthritis  . Cancer [place]   multiple myeloma . Cervical stenosis of spinal canal  . Wears glasses  Past Surgical History: Past Surgical History: Procedure Laterality Date . ANTERIOR CERVICAL CORPECTOMY N/A 11/04/2018  Procedure: Cervical three Corpectomy with Cervical two to Cervical four Plating;  Surgeon: SErline Levine MD;  [facility];  Service: Neurosurgery;  Laterality: N/A; . APPENDECTOMY   . BACK SURGERY   . EYE SURGERY Bilateral   cataract surgery with lens implants . HERNIA REPAIR Left   inguinal  . IR IMAGING GUIDED PORT INSERTION  06/21/2018 . ROTATOR CUFF REPAIR Left  HPI: Patient is a 64 y.o. male with PMH: multiple myeloma, arthritis, cervical stenosis, who presented to  hospital with nosebleed which was not able to be controlled at home despite attempts with pressure, paper towels and ice. He had similar episodes two weeks prior where bleeding lasted 8 hours before it finally stopped. Patient has had poor appetite for past couple weeks. Wife reported he seemed more confused in AM of admission. on 12/11, RN reported patient with significant difficulty with swallowing and unable to take medications and so SLP BSE ordered.  Pt underwent BSE on 01/14/2019 and was found to be too high risk for aspiration with recommendation for npo.  Subjective: pt awake in chair in flouro suite Assessment / Plan / Recommendation CHL IP CLINICAL IMPRESSIONS 01/16/2019 Clinical Impression Pt presents with minimal oropharyngeal dysphagia without aspiration or laryngeal penetration of any consistency tested.  In general, pt's swallow is strong and timely with adequate airway protection even with sequential liquid bolus swallows.  He did demonstrate difficulty orally transiting tablet with thin barium - and after 3 unsuccesful attempts, pudding effective to transit.  In addition, decreased UES relaxation noted when swallowing masticated cracker which results in residuals without pt awareness.  Liquid swallow faciliated clearance but also resulted in trace backflow, thus recommend strict esophageal precautions.  Given pt's generalized weakness and decreased UES clearance with solids (not sensate) - dys3 diet advised at this time.  Medicine with puree- whole - starting and following with liquids.  Of note, pt did NOT cough during entire MBS - symptoms of cough with liquids reported prior to admission and during hospitalization.  SLP educated pt and his wife (present for test) to findings/recommendations.  Will follow up for dysphagia management/treatment. Anticipate pt will benefit from dysphagia treatment given his XRT to pharyngeal region. SLP Visit Diagnosis Dysphagia, oropharyngeal phase (R13.12)  Attention and concentration deficit following -- Frontal lobe and executive function deficit following -- Impact on safety and function Mild aspiration risk   CHL IP TREATMENT RECOMMENDATION 01/16/2019 Treatment Recommendations Therapy as outlined in treatment plan below   Prognosis 01/16/2019 Prognosis for Safe Diet Advancement Good Barriers to Reach Goals -- Barriers/Prognosis Comment -- CHL IP DIET RECOMMENDATION 01/16/2019 SLP Diet Recommendations Dysphagia 3 (Mech soft) solids;Thin liquid Liquid Administration via Cup;Straw Medication Administration Whole meds with puree Compensations Small sips/bites;Slow rate;Effortful swallow;Minimize environmental distractions Postural Changes --   CHL IP OTHER RECOMMENDATIONS 01/16/2019 Recommended Consults -- Oral Care Recommendations Oral care BID Other Recommendations Clarify dietary restrictions   CH[facility] IP FOLLOW UP RECOMMENDATIONS 01/16/2019 Follow up Recommendations Skilled Nursing facility   [facility] IP FREQUENCY AND DURATION 01/16/2019 Speech Therapy Frequency (ACUTE ONLY) min 2x/week Treatment Duration 2 weeks      CHL IP ORAL PHASE 01/16/2019 Oral Phase Impaired Oral - Pudding Teaspoon -- Oral - Pudding Cup -- Oral - Honey Teaspoon -- Oral - Honey Cup -- Oral - Nectar Teaspoon -- Oral - Nectar Cup WFL Oral - Nectar Straw -- Oral - Thin Teaspoon -- Oral - Thin Cup WFL Oral - Thin Straw WFL Oral - Puree WFL Oral - Mech Soft WFL Oral - Regular -- Oral - Multi-Consistency -- Oral - Pill Reduced posterior propulsion;Weak lingual manipulation Oral Phase -  Comment pt needed pudding to orally transit tablet after unable x3 with liquids  CHL IP PHARYNGEAL PHASE 01/16/2019 Pharyngeal Phase WFL;Impaired Pharyngeal- Pudding Teaspoon -- Pharyngeal -- Pharyngeal- Pudding Cup -- Pharyngeal -- Pharyngeal- Honey Teaspoon -- Pharyngeal -- Pharyngeal- Honey Cup -- Pharyngeal -- Pharyngeal- Nectar Teaspoon -- Pharyngeal -- Pharyngeal- Nectar Cup [facility] Pharyngeal Material does not enter  airway Pharyngeal- Nectar Straw -- Pharyngeal -- Pharyngeal- Thin Teaspoon -- Pharyngeal -- Pharyngeal- Thin Cup [facility] Pharyngeal Material does not enter airway Pharyngeal- Thin Straw WFL Pharyngeal Material does not enter airway Pharyngeal- Puree WFL Pharyngeal Material does not enter airway Pharyngeal- Mechanical Soft Pharyngeal residue - cp segment Pharyngeal Material does not enter airway Pharyngeal- Regular NT Pharyngeal -- Pharyngeal- Multi-consistency -- Pharyngeal -- Pharyngeal- Pill WFL Pharyngeal Material does not enter airway Pharyngeal Comment --  CHL IP CERVICAL ESOPHAGEAL PHASE 01/16/2019 Cervical Esophageal Phase Impaired Pudding Teaspoon -- Pudding Cup -- Honey Teaspoon -- Honey Cup -- Nectar Teaspoon -- Nectar Cup WFL Nectar Straw -- Thin Teaspoon -- Thin Cup WFL Thin Straw Reduced cricopharyngeal relaxation;Esophageal backflow into cervical esophagus Puree WFL Mechanical Soft Reduced cricopharyngeal relaxation;Esophageal backflow into cervical esophagus Regular -- Multi-consistency -- Pill WFL Cervical Esophageal Comment pt without sensation to minimal residuals at pyriform/above UES region, liquid assisted clearance but also resulted in trace backflow, barium tablet swallowed with pudding appeared to transit into sttomach without delay Kathleen Lime, MS [facility] SLP Acute Rehab Services Office [phone] [name] 01/16/2019, 2:19 PM              EEG adult  Result Date: 01/24/2019 Lora Havens, MD     01/24/2019  6:58 PM Patient Name: Jesus [name] MRN: [id number] Epilepsy Attending: Lora Havens Referring Physician/Provider: Dr Jacki Cones Date: 01/24/2019 Duration: 22.09 mins Patient history: 64yo M with ams and jerking. EEG to evaluate for seizure Level of alertness: awake AEDs during EEG study: Lorazepam Technical aspects: This EEG study was done with scalp electrodes positioned according to the 10-20 International system of electrode placement. Electrical activity was  acquired at a sampling rate of '500Hz'$  and reviewed with a high frequency filter of '70Hz'$  and a low frequency filter of '1Hz'$ . EEG data were recorded continuously and digitally stored. DESCRIPTION: No clear posterior dominant rhythm was seen. EEG showed continuous generalized 3-'5hz'$  theta-delta slowing. Hyperventilation and photic stimulation were not performed. ABNORMALITY - Continuous slow, generalized IMPRESSION: This study is suggestive of moderate diffuse encephalopathy, non specific to etiology. No seizures or epileptiform discharges were seen throughout the recording. Priyanka Barbra Sarks   CT BONE MARROW BIOPSY & ASPIRATION  Result Date: 01/16/2019 INDICATION: 64 year old male with a history of multiple myeloma status post stem cell transplant and chemotherapy. He presents with pancytopenia and severe hypercalcemia. He requires bone marrow biopsy to evaluate for progression of disease. EXAM: CT GUIDED BONE MARROW ASPIRATION AND CORE BIOPSY Interventional Radiologist:  Criselda Peaches, MD MEDICATIONS: None. ANESTHESIA/SEDATION: Moderate (conscious) sedation was employed during this procedure. A total of 2 milligrams versed and 100 micrograms fentanyl were administered intravenously. The patient's level of consciousness and vital signs were monitored continuously by radiology nursing throughout the procedure under my direct supervision. Total monitored sedation time: 10 minutes FLUOROSCOPY TIME:  None COMPLICATIONS: None immediate. Estimated blood loss: <25 mL PROCEDURE: Informed written consent was obtained from the patient after a thorough discussion of the procedural risks, benefits and alternatives. All questions were addressed. Maximal Sterile Barrier Technique was utilized including caps, mask, sterile gowns, sterile gloves, sterile  drape, hand hygiene and skin antiseptic. A timeout was performed prior to the initiation of the procedure. The patient was positioned prone and non-contrast localization CT  was performed of the pelvis to demonstrate the iliac marrow spaces. Maximal barrier sterile technique utilized including caps, mask, sterile gowns, sterile gloves, large sterile drape, hand hygiene, and betadine prep. Under sterile conditions and local anesthesia, an 11 gauge coaxial bone biopsy needle was advanced into the right iliac marrow space. Needle position was confirmed with CT imaging. Initially, bone marrow aspiration was performed. Next, the 11 gauge outer cannula was utilized to obtain a right iliac bone marrow core biopsy. Needle was removed. Hemostasis was obtained with compression. The patient tolerated the procedure well. Samples were prepared with the cytotechnologist. IMPRESSION: Technically successful CT-guided bone marrow biopsy and aspiration. Signed, Criselda Peaches, MD, [facility] Electronically Signed   By: Jacqulynn Cadet M.D.   On: 01/16/2019 10:58    Berle Mull,  [facility]  If 7PM-7AM, please contact [email or web address] [email or web address] Password [email or web address] 02/09/2019, 4:51 PM   LOS: 28 days

## 2019-02-09 NOTE — Progress Notes (Signed)
[name]   HEMATOLOGY/ONCOLOGY INPATIENT PROGRESS NOTE  Date of Service: 01/19/2019 Inpatient Attending: .Lavina Hamman, MD   SUBJECTIVE  Patient was seen in follow-up with his wife at bedside.  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  Cytopenias have not improved off daratumumab.  He has continued to have refractory progressive hypercalcemia with intermittent metabolic encephalopathy.  His performance status has progressively declined with an ECOG PS of 3-4 at this time. I was asked to follow-up to redefine goals of care.  I picked up on my previous discussion with the patient and his wife.  Patient looks very exhausted.  The patient and his wife are more clear today that they would not want to pursue any chemotherapy related options and prefer to focus on best supportive cares.  We explicitly discussed if they have arrived at the decision to pursue hospice.  Wife mentions that she would like to talk to couple of other family members but were likely to go down that route.  I answered her questions about treatment options.  We discussed that with his current refractory cytopenias and performance status and the very resistant nature of his 17 P mutated multiple myeloma the chances of rapid improvement or significant improvement would be low and the chances of additional treatment toxicities and more rapid clinical decline with treatment would be high. I provided emotional support and shared my unequivocal recommendation to consider best supportive cares through hospice at this time given that the time based supportive transfusions and hypercalcemia management and nutritional input have not gotten the patient to a better performance status which would entail additional treatment options.  OBJECTIVE:  PHYSICAL EXAMINATION: .BP 115/71 (BP Location: Right Arm)    Pulse (!)  108    Temp 98.3 F (36.8 C) (Oral)    Resp 18    Ht '5\' 6"'  (1.676 m) Comment: patient stated that he was 5'6"   Wt 99 lb 3.3 oz (45 kg)    SpO2 99%    BMI 16.01 kg/m   .Body mass index is 16.01 kg/m.  GENERAL:alert, resolved epistaxis.  Looks emaciated and very fatigued.  Laying in bed. SKIN: No rashes or petechiae EYES: Sunken eyes OROPHARYNX: Mucous membranes dry NECK: supple LYMPH:  no palpable lymphadenopathy in the cervical, axillary or inguinal LUNGS: clear to auscultation with normal respiratory effort HEART: regular rate & rhythm,  no murmurs and no lower extremity edema ABDOMEN: abdomen soft, non-tender, normoactive bowel sounds  Musculoskeletal: no cyanosis of digits and no clubbing  PSYCH: alert but lethargic and chooses to be less conversational. NEURO: no focal motor/sensory deficits.   MEDICAL HISTORY:  Past Medical History:  Diagnosis Date   Arthritis    Cancer ([place])    multiple myeloma   Cervical stenosis of spinal canal    Wears glasses     SURGICAL HISTORY: Past Surgical History:  Procedure Laterality Date   ANTERIOR CERVICAL CORPECTOMY N/A 11/04/2018   Procedure: Cervical three Corpectomy with Cervical two to Cervical four Plating;  Surgeon: Erline Levine, MD;  Location: [facility];  Service: Neurosurgery;  Laterality: N/A;   APPENDECTOMY     BACK SURGERY     EYE SURGERY Bilateral    cataract surgery with lens implants   HERNIA REPAIR Left    inguinal    IR IMAGING GUIDED PORT INSERTION  06/21/2018   ROTATOR CUFF REPAIR Left  SOCIAL HISTORY: Social History   Socioeconomic History   Marital status: Married    Spouse name: Not on file   Number of children: Not on file   Years of education: Not on file   Highest education level: Not on file  Occupational History   Not on file  Tobacco Use   Smoking status: Never Smoker   Smokeless tobacco: Never Used  Substance and Sexual Activity   Alcohol use: Never   Drug use: Never    Sexual activity: Not on file  Other Topics Concern   Not on file  Social History Narrative   Not on file   Social Determinants of Health   Financial Resource Strain:    Difficulty of Paying Living Expenses: Not on file  Food Insecurity:    Worried About [place] in the Last Year: Not on file   Ran Out of Food in the Last Year: Not on file  Transportation Needs: No Transportation Needs   Lack of Transportation (Medical): No   Lack of Transportation (Non-Medical): No  Physical Activity:    Days of Exercise per Week: Not on file   Minutes of Exercise per Session: Not on file  Stress:    Feeling of Stress : Not on file  Social Connections:    Frequency of Communication with Friends and Family: Not on file   Frequency of Social Gatherings with Friends and Family: Not on file   Attends Religious Services: Not on file   Active Member of Clubs or Organizations: Not on file   Attends Archivist Meetings: Not on file   Marital Status: Not on file  Intimate Partner Violence:    Fear of Current or Ex-Partner: Not on file   Emotionally Abused: Not on file   Physically Abused: Not on file   Sexually Abused: Not on file    FAMILY HISTORY: Family History  Problem Relation Age of Onset   Breast cancer Sister     ALLERGIES:  is allergic to penicillins.  MEDICATIONS:  Scheduled Meds:  sodium chloride   Intravenous Once   sodium chloride   Intravenous Once   calcitonin (salmon)  1 spray Alternating Nares Daily   Chlorhexidine Gluconate Cloth  6 each Topical Daily   dronabinol  10 mg Oral BID   feeding supplement  1 Container Oral TID BM   feeding supplement (PRO-STAT SUGAR FREE 64)  30 mL Oral BID   oxymetazoline  1 spray Each Nare BID   sucralfate  1 g Oral TID AC & HS   Continuous Infusions:  0.9 % NaCl with KCl 20 mEq / L 75 mL/hr at 02/08/19 1734   PRN Meds:.  REVIEW OF SYSTEMS:    10 Point review of Systems was done is  negative except as noted above.   LABORATORY DATA:  I have reviewed the data as listed  . CBC Latest Ref Rng & Units 02/09/2019 02/08/2019 02/07/2019  WBC 4.0 - 10.5 K/uL 2.6(L) 2.7(L) 2.5(L)  Hemoglobin 13.0 - 17.0 g/dL 6.4(LL) 8.3(L) 8.7(L)  Hematocrit 39.0 - 52.0 % 19.8(L) 25.1(L) 26.9(L)  Platelets 150 - 400 K/uL 109(L) 9(LL) 17(LL)    CBC    Component Value Date/Time   WBC 2.6 (L) 02/09/2019 0343   RBC 2.18 (L) 02/09/2019 0343   HGB 6.4 (LL) 02/09/2019 0343   HGB 8.5 (L) 12/30/2018 0940   HCT 19.8 (L) 02/09/2019 0343   PLT 109 (L) 02/09/2019 0343   PLT 25 (L) 12/30/2018  0940   MCV 90.8 02/09/2019 0343   MCH 29.4 02/09/2019 0343   MCHC 32.3 02/09/2019 0343   RDW 17.6 (H) 02/09/2019 0343   LYMPHSABS 1.0 02/09/2019 0343   MONOABS 0.4 02/09/2019 0343   EOSABS 0.0 02/09/2019 0343   BASOSABS 0.0 02/09/2019 0343   . CMP Latest Ref Rng & Units 02/09/2019 02/08/2019 02/07/2019  Glucose 70 - 99 mg/dL 121(H) 118(H) 105(H)  BUN 8 - 23 mg/dL '18 11 11  ' Creatinine 0.61 - 1.24 mg/dL 0.42(L) 0.54(L) 0.52(L)  Sodium 135 - 145 mmol/L 136 131(L) 132(L)  Potassium 3.5 - 5.1 mmol/L 3.4(L) 3.4(L) 3.7  Chloride 98 - 111 mmol/L 101 96(L) 100  CO2 22 - 32 mmol/L '29 28 27  ' Calcium 8.9 - 10.3 mg/dL 13.1(HH) 14.2(HH) 14.1(HH)  Total Protein 6.5 - 8.1 g/dL 7.5 8.1 8.0  Total Bilirubin 0.3 - 1.2 mg/dL 0.8 0.9 0.7  Alkaline Phos 38 - 126 U/L 303(H) 340(H) 329(H)  AST 15 - 41 U/L 39 39 34  ALT 0 - 44 U/L 48(H) 51(H) 44     Surgical Pathology  CASE: [id number]  PATIENT: Emad Ishii  Bone Marrow Report      Clinical History: Multiple myeloma, concern for disease progression      DIAGNOSIS:   BONE MARROW, ASPIRATE, CLOT, CORE:  - Persistent involvement by plasma cell myeloma.  - Mild myeloid dysplasia.  - See comment.   PERIPHERAL BLOOD:  - Pancytopenia.   COMMENT:   There is a mild increased in monotypic plasma cells (5-10%) consistent  with persistent involvement by the  patient's plasma cell myeloma. There  is mild myeloid dysplasia raising the question of an underlying  myelodysplastic syndrome and FISH for MDS has been ordered.   MICROSCOPIC DESCRIPTION:   PERIPHERAL BLOOD SMEAR: There is a normocytic anemia with hypochromic  cells and mild rouleaux formation. Leukocytes are reduced in numbers  with pseudo-Pelger-Hut type neutrophils. Circulating plasma cells are  not identified. Platelets are reduced in numbers.   BONE MARROW ASPIRATE: Spicular and cellular.  Erythroid precursors: Mild relative increase in numbers. No significant  dysplasia.  Granulocytic precursors: Mild relative decrease in. Mild left shifted  maturation. No increase in blasts.  Megakaryocytes: Decreased in numbers but with unremarkable morphology.  Lymphocytes/plasma cells: Plasma cells are not significantly increased,  but atypical forms (large forms, bizarre forms, multi-nucleated forms  and forms with prominent nucleoli) are present. There is no increase in  lymphocytes.   TOUCH PREPARATIONS: Similar to aspirate smears.   CLOT AND BIOPSY: The core biopsy and clot section are variably cellular  (30 to 60%). There are focal clusters of atypical plasma cells  highlighted by CD138 (estimated 5-10% of cells) with associated  fibrosis. By in situ hybridization plasma cells are lambda restricted.  Background hematopoiesis is present with a mild erythroid  hyperplasia/myeloid hypoplasia. Megakaryocytes are slightly reduced.  There are no atypical lymphoid aggregates.   IRON STAIN: Iron stains are performed on a bone marrow aspirate or touch  imprint smear and section of clot. The controls stained appropriately.     Storage Iron: Present    Ring Sideroblasts: Absent   ADDITIONAL DATA/TESTING: Cytogenetics, including FISH for myeloma and  MDS, was ordered and will be reported separately. c  RADIOGRAPHIC STUDIES: I have personally reviewed the radiological  images as listed and agreed with the findings in the report. CT HEAD WO CONTRAST  Result Date: 01/24/2019 CLINICAL DATA:  Delirium EXAM: CT HEAD WITHOUT CONTRAST TECHNIQUE: Contiguous axial images  were obtained from the base of the skull through the vertex without intravenous contrast. COMPARISON:  None. FINDINGS: Brain: No evidence of acute infarction, hemorrhage, hydrocephalus, extra-axial collection or mass lesion/mass effect. Vascular: Negative for hyperdense vessel Skull: Negative Sinuses/Orbits: Paranasal sinuses clear. Negative orbit bilaterally. Bilateral cataract surgery. Other: None IMPRESSION: Negative CT head.  No acute abnormality Electronically Signed   By: Franchot Gallo M.D.   On: 01/24/2019 14:38   CT ANGIO CHEST PE W OR WO CONTRAST  Result Date: 02/08/2019 CLINICAL DATA:  Shortness of breath with history of multiple myeloma. Elevated D-dimer. Severe thrombocytopenia and anemia. EXAM: CT ANGIOGRAPHY CHEST WITH CONTRAST TECHNIQUE: Multidetector CT imaging of the chest was performed using the standard protocol during bolus administration of intravenous contrast. Multiplanar CT image reconstructions and MIPs were obtained to evaluate the vascular anatomy. CONTRAST:  66m OMNIPAQUE IOHEXOL 350 MG/ML SOLN COMPARISON:  None. FINDINGS: Cardiovascular: Evaluation for pulmonary emboli is limited by significant respiratory motion artifact.Given this limitation, no large centrally located pulmonary embolism was detected. Detection of smaller pulmonary emboli is limited. The main pulmonary artery is within normal limits for size. There is no CT evidence of acute right heart strain. There is no evidence for thoracic aortic aneurysm or dissection. Aortic calcifications are noted. Heart size is normal. There is no significant pericardial effusion. There is a well-positioned right-sided Port-A-Cath in place. Mediastinum/Nodes: --No mediastinal or hilar lymphadenopathy. --No axillary lymphadenopathy. --No  supraclavicular lymphadenopathy. --Normal thyroid gland. --The esophagus is unremarkable Lungs/Pleura: There is no pneumothorax. No large pleural effusion. Atelectasis is noted at the lung bases, left worse than right. The trachea is unremarkable. Upper Abdomen: No acute abnormality. Musculoskeletal: There are extensive lytic lesions scattered throughout the visualized osseous structures. Multiple lytic soft tissue masses are noted involving the ribs, greatest on the left. There is age-indeterminate height loss of the T5 vertebral body. The patient is status post posterior fusion of the mid to lower thoracic spine. A large lytic lesion is noted in the T10 vertebral body. The hardware appears grossly intact. Review of the MIP images confirms the above findings. IMPRESSION: 1. Evaluation for pulmonary emboli is limited by respiratory motion artifact. Given this limitation, no PE was identified. 2. Bibasilar atelectasis, otherwise the lungs are essentially clear. 3. Extensive lytic lesions involving the osseous structures with multiple lytic soft tissue masses involving the ribs is consistent with the patient's history of multiple myeloma Aortic Atherosclerosis (ICD10-I70.0). Electronically Signed   By: CConstance HolsterM.D.   On: 02/08/2019 19:54   NM Bone Scan Whole Body  Result Date: 01/12/2019 CLINICAL DATA:  History of multiple myeloma with acute back pain. EXAM: NUCLEAR MEDICINE WHOLE BODY BONE SCAN TECHNIQUE: Whole body anterior and posterior images were obtained approximately 3 hours after intravenous injection of radiopharmaceutical. RADIOPHARMACEUTICALS:  21.8 mCi Technetium-920mDP IV COMPARISON:  PET-CT 10/13/2018 FINDINGS: Examination demonstrates patchy increased uptake over the mid to lower thoracic spine over known areas of fusion hardware and bone destruction representing patient's known multiple myeloma. Mild uptake over the right acetabulum likely representing patient's multiple myeloma.  Minimal hazy uptake on the anterior image at the cervicothoracic junction which may represent patient's known myelomatous involvement of the cervical spine. Minimal patchy uptake over several anterior upper ribs bilaterally which may be due to patient's myomatous disease. Minimal degenerative changes over the shoulders and knees. IMPRESSION: 1. Uptake over the right acetabulum, mid to lower thoracic spine, upper anterior ribs and possibly cervicothoracic junction likely representing patient's known multiple myeloma. 2.  Degenerative changes as described. Electronically Signed   By: Marin Olp M.D.   On: 01/12/2019 13:23   CT BIOPSY  Result Date: 01/16/2019 INDICATION: 64 year old male with a history of multiple myeloma status post stem cell transplant and chemotherapy. He presents with pancytopenia and severe hypercalcemia. He requires bone marrow biopsy to evaluate for progression of disease. EXAM: CT GUIDED BONE MARROW ASPIRATION AND CORE BIOPSY Interventional Radiologist:  Criselda Peaches, MD MEDICATIONS: None. ANESTHESIA/SEDATION: Moderate (conscious) sedation was employed during this procedure. A total of 2 milligrams versed and 100 micrograms fentanyl were administered intravenously. The patient's level of consciousness and vital signs were monitored continuously by radiology nursing throughout the procedure under my direct supervision. Total monitored sedation time: 10 minutes FLUOROSCOPY TIME:  None COMPLICATIONS: None immediate. Estimated blood loss: <25 mL PROCEDURE: Informed written consent was obtained from the patient after a thorough discussion of the procedural risks, benefits and alternatives. All questions were addressed. Maximal Sterile Barrier Technique was utilized including caps, mask, sterile gowns, sterile gloves, sterile drape, hand hygiene and skin antiseptic. A timeout was performed prior to the initiation of the procedure. The patient was positioned prone and non-contrast  localization CT was performed of the pelvis to demonstrate the iliac marrow spaces. Maximal barrier sterile technique utilized including caps, mask, sterile gowns, sterile gloves, large sterile drape, hand hygiene, and betadine prep. Under sterile conditions and local anesthesia, an 11 gauge coaxial bone biopsy needle was advanced into the right iliac marrow space. Needle position was confirmed with CT imaging. Initially, bone marrow aspiration was performed. Next, the 11 gauge outer cannula was utilized to obtain a right iliac bone marrow core biopsy. Needle was removed. Hemostasis was obtained with compression. The patient tolerated the procedure well. Samples were prepared with the cytotechnologist. IMPRESSION: Technically successful CT-guided bone marrow biopsy and aspiration. Signed, Criselda Peaches, MD, [facility] Radiology Electronically Signed   By: Jacqulynn Cadet M.D.   On: 01/16/2019 10:58   DG Swallowing Func-Speech Pathology  Result Date: 01/16/2019 Objective Swallowing Evaluation: Type of Study: Bedside Swallow Evaluation  Patient Details Name: JAMAAR HOWES MRN: [id number] Date of Birth: 1955/12/22 Today's Date: 01/16/2019 Time: SLP Start Time (ACUTE ONLY): 1305 -SLP Stop Time (ACUTE ONLY): 1335 SLP Time Calculation (min) (ACUTE ONLY): 30 min Past Medical History: Past Medical History: Diagnosis Date  Arthritis   Cancer ([place])   multiple myeloma  Cervical stenosis of spinal canal   Wears glasses  Past Surgical History: Past Surgical History: Procedure Laterality Date  ANTERIOR CERVICAL CORPECTOMY N/A 11/04/2018  Procedure: Cervical three Corpectomy with Cervical two to Cervical four Plating;  Surgeon: Erline Levine, MD;  Location: [facility];  Service: Neurosurgery;  Laterality: N/A;  APPENDECTOMY    BACK SURGERY    EYE SURGERY Bilateral   cataract surgery with lens implants  HERNIA REPAIR Left   inguinal   IR IMAGING GUIDED PORT  INSERTION  06/21/2018  ROTATOR CUFF REPAIR Left  HPI: Patient is a 64 y.o. male with PMH: multiple myeloma, arthritis, cervical stenosis, who presented to hospital with nosebleed which was not able to be controlled at home despite attempts with pressure, paper towels and ice. He had similar episodes two weeks prior where bleeding lasted 8 hours before it finally stopped. Patient has had poor appetite for past couple weeks. Wife reported he seemed more confused in AM of admission. on 12/11, RN reported patient with significant difficulty with swallowing and unable to take medications and  so SLP BSE ordered.  Pt underwent BSE on 01/14/2019 and was found to be too high risk for aspiration with recommendation for npo.  Subjective: pt awake in chair in flouro suite Assessment / Plan / Recommendation CHL IP CLINICAL IMPRESSIONS 01/16/2019 Clinical Impression Pt presents with minimal oropharyngeal dysphagia without aspiration or laryngeal penetration of any consistency tested.  In general, pt's swallow is strong and timely with adequate airway protection even with sequential liquid bolus swallows.  He did demonstrate difficulty orally transiting tablet with thin barium - and after 3 unsuccesful attempts, pudding effective to transit.  In addition, decreased UES relaxation noted when swallowing masticated cracker which results in residuals without pt awareness.  Liquid swallow faciliated clearance but also resulted in trace backflow, thus recommend strict esophageal precautions.  Given pt's generalized weakness and decreased UES clearance with solids (not sensate) - dys3 diet advised at this time.  Medicine with puree- whole - starting and following with liquids.  Of note, pt did NOT cough during entire MBS - symptoms of cough with liquids reported prior to admission and during hospitalization.  SLP educated pt and his wife (present for test) to findings/recommendations.  Will follow up for dysphagia management/treatment.  Anticipate pt will benefit from dysphagia treatment given his XRT to pharyngeal region. SLP Visit Diagnosis Dysphagia, oropharyngeal phase (R13.12) Attention and concentration deficit following -- Frontal lobe and executive function deficit following -- Impact on safety and function Mild aspiration risk   CHL IP TREATMENT RECOMMENDATION 01/16/2019 Treatment Recommendations Therapy as outlined in treatment plan below   Prognosis 01/16/2019 Prognosis for Safe Diet Advancement Good Barriers to Reach Goals -- Barriers/Prognosis Comment -- CHL IP DIET RECOMMENDATION 01/16/2019 SLP Diet Recommendations Dysphagia 3 (Mech soft) solids;Thin liquid Liquid Administration via Cup;Straw Medication Administration Whole meds with puree Compensations Small sips/bites;Slow rate;Effortful swallow;Minimize environmental distractions Postural Changes --   CHL IP OTHER RECOMMENDATIONS 01/16/2019 Recommended Consults -- Oral Care Recommendations Oral care BID Other Recommendations Clarify dietary restrictions   CHL IP FOLLOW UP RECOMMENDATIONS 01/16/2019 Follow up Recommendations Skilled Nursing facility   [facility] IP FREQUENCY AND DURATION 01/16/2019 Speech Therapy Frequency (ACUTE ONLY) min 2x/week Treatment Duration 2 weeks      CHL IP ORAL PHASE 01/16/2019 Oral Phase Impaired Oral - Pudding Teaspoon -- Oral - Pudding Cup -- Oral - Honey Teaspoon -- Oral - Honey Cup -- Oral - Nectar Teaspoon -- Oral - Nectar Cup WFL Oral - Nectar Straw -- Oral - Thin Teaspoon -- Oral - Thin Cup WFL Oral - Thin Straw WFL Oral - Puree WFL Oral - Mech Soft WFL Oral - Regular -- Oral - Multi-Consistency -- Oral - Pill Reduced posterior propulsion;Weak lingual manipulation Oral Phase - Comment pt needed pudding to orally transit tablet after unable x3 with liquids  CHL IP PHARYNGEAL PHASE 01/16/2019 Pharyngeal Phase WFL;Impaired Pharyngeal- Pudding Teaspoon -- Pharyngeal -- Pharyngeal- Pudding Cup -- Pharyngeal -- Pharyngeal- Honey Teaspoon -- Pharyngeal --  Pharyngeal- Honey Cup -- Pharyngeal -- Pharyngeal- Nectar Teaspoon -- Pharyngeal -- Pharyngeal- Nectar Cup [facility] Pharyngeal Material does not enter airway Pharyngeal- Nectar Straw -- Pharyngeal -- Pharyngeal- Thin Teaspoon -- Pharyngeal -- Pharyngeal- Thin Cup [facility] Pharyngeal Material does not enter airway Pharyngeal- Thin Straw WFL Pharyngeal Material does not enter airway Pharyngeal- Puree WFL Pharyngeal Material does not enter airway Pharyngeal- Mechanical Soft Pharyngeal residue - cp segment Pharyngeal Material does not enter airway Pharyngeal- Regular NT Pharyngeal -- Pharyngeal- Multi-consistency -- Pharyngeal -- Pharyngeal- Pill WFL Pharyngeal Material does not enter airway  Pharyngeal Comment --  CHL IP CERVICAL ESOPHAGEAL PHASE 01/16/2019 Cervical Esophageal Phase Impaired Pudding Teaspoon -- Pudding Cup -- Honey Teaspoon -- Honey Cup -- Nectar Teaspoon -- Nectar Cup WFL Nectar Straw -- Thin Teaspoon -- Thin Cup WFL Thin Straw Reduced cricopharyngeal relaxation;Esophageal backflow into cervical esophagus Puree WFL Mechanical Soft Reduced cricopharyngeal relaxation;Esophageal backflow into cervical esophagus Regular -- Multi-consistency -- Pill WFL Cervical Esophageal Comment pt without sensation to minimal residuals at pyriform/above UES region, liquid assisted clearance but also resulted in trace backflow, barium tablet swallowed with pudding appeared to transit into sttomach without delay Kathleen Lime, MS [facility] SLP Acute Rehab Services Office [phone] [name] 01/16/2019, 2:19 PM              EEG adult  Result Date: 01/24/2019 Lora Havens, MD     01/24/2019  6:58 PM Patient Name: OBERT ESPINDOLA MRN: [id number] Epilepsy Attending: Lora Havens Referring Physician/Provider: Dr Jacki Cones Date: 01/24/2019 Duration: 22.09 mins Patient history: 64yo M with ams and jerking. EEG to evaluate for seizure Level of alertness: awake AEDs during EEG study: Lorazepam Technical aspects: This  EEG study was done with scalp electrodes positioned according to the 10-20 International system of electrode placement. Electrical activity was acquired at a sampling rate of '500Hz'  and reviewed with a high frequency filter of '70Hz'  and a low frequency filter of '1Hz' . EEG data were recorded continuously and digitally stored. DESCRIPTION: No clear posterior dominant rhythm was seen. EEG showed continuous generalized 3-'5hz'  theta-delta slowing. Hyperventilation and photic stimulation were not performed. ABNORMALITY - Continuous slow, generalized IMPRESSION: This study is suggestive of moderate diffuse encephalopathy, non specific to etiology. No seizures or epileptiform discharges were seen throughout the recording. Priyanka Barbra Sarks   CT BONE MARROW BIOPSY & ASPIRATION  Result Date: 01/16/2019 INDICATION: 64 year old male with a history of multiple myeloma status post stem cell transplant and chemotherapy. He presents with pancytopenia and severe hypercalcemia. He requires bone marrow biopsy to evaluate for progression of disease. EXAM: CT GUIDED BONE MARROW ASPIRATION AND CORE BIOPSY Interventional Radiologist:  Criselda Peaches, MD MEDICATIONS: None. ANESTHESIA/SEDATION: Moderate (conscious) sedation was employed during this procedure. A total of 2 milligrams versed and 100 micrograms fentanyl were administered intravenously. The patient's level of consciousness and vital signs were monitored continuously by radiology nursing throughout the procedure under my direct supervision. Total monitored sedation time: 10 minutes FLUOROSCOPY TIME:  None COMPLICATIONS: None immediate. Estimated blood loss: <25 mL PROCEDURE: Informed written consent was obtained from the patient after a thorough discussion of the procedural risks, benefits and alternatives. All questions were addressed. Maximal Sterile Barrier Technique was utilized including caps, mask, sterile gowns, sterile gloves, sterile drape, hand hygiene and skin  antiseptic. A timeout was performed prior to the initiation of the procedure. The patient was positioned prone and non-contrast localization CT was performed of the pelvis to demonstrate the iliac marrow spaces. Maximal barrier sterile technique utilized including caps, mask, sterile gowns, sterile gloves, large sterile drape, hand hygiene, and betadine prep. Under sterile conditions and local anesthesia, an 11 gauge coaxial bone biopsy needle was advanced into the right iliac marrow space. Needle position was confirmed with CT imaging. Initially, bone marrow aspiration was performed. Next, the 11 gauge outer cannula was utilized to obtain a right iliac bone marrow core biopsy. Needle was removed. Hemostasis was obtained with compression. The patient tolerated the procedure well. Samples were prepared with the cytotechnologist. IMPRESSION: Technically successful CT-guided bone marrow biopsy and  aspiration. Signed, Criselda Peaches, MD, [facility] Electronically Signed   By: Jacqulynn Cadet M.D.   On: 01/16/2019 10:58    ASSESSMENT & PLAN:   64 y.o. male with  1. 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 IgG at 2220. 08/11/16 PET/CT revealed Hypermetabolic large soft tissue mass in the lower thoracic paraspinal region associated with lytic destruction of T10 vertebral body, concerning for multiple myeloma. Additional smaller lytic lesions involving the skeleton. S/p RT x 4 fractions, discontinued due to T10 pathologic fracture with severe cord compression S/p 08/26/16 T10 corpectomy and T8-L1 posterior spinal fusion   Began 7 cycles of VRd on 09/24/16 S/p autologous stem cell transplant, Day 0 on 02/23/17 04/21/17 BM Bx with residual less than 5%  lambda light chain restricted plasma cells in the bone marrow. M Spike at 0.4g Began maintenance 49m/m2 Carfilzomib every 2 weeks on 06/16/17  06/09/18 PET/CT revealed "Multifocal hypermetabolic lytic lesions in the skeleton as noted above, compatible with active myeloma. In the spine, the most notable active lesion of concern is at the T12 level with there is a large right eccentric vertebral body lesion with demineralization of the cortex and likely some paravertebral extension of tumor. This could cause loosening of the right pedicle screw. Intraspinal extension of tumor is difficult to exclude. MRI might be considered although the posterolateral rod and pedicle screws may cause artifact. 2. There is likewise cortical breakthrough associated with the lytic lesion along the left upper acetabulum. 3. A lesion in the left intertrochanteric area of the femur is large enough to potentially cause biomechanical weakening which increases risk of fracture. 4. Other skeletal sites of active involvement are detailed above in the skeleton section. 5. Several lucent lesions are observed including the calvarium, T1 vertebral body, and left L4 pedicle which are not hypermetabolic and may represent effectively previously treated lesions. 6. 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 There is also a lipoma along the right brachialis muscle. 7. Nonspecific subcutaneous edema especially in the right forearm but also extending up into the right upper arm. Cause is uncertain. There is some low-grade nonfocal metabolic activity along this area. Strictly speaking, cellulitis is not excluded, correlate with clinical history and visual inspection. 8. Other imaging findings of potential clinical significance: Aortic Atherosclerosis. Mild cardiomegaly. Cholelithiasis. Prostatomegaly."  06/17/18 MMP revealed M Protein at 0.5g, just before beginning C1  Daratumumab  06/30/18 BM Biopsy revealed normocellular marrow with minimal involvement by plasma cell neoplasm (5% plasma cells), normal cytogenetics.  06/30/18 Molecular Cytogenetics did not have enough material for testing.  10/13/2018 PET whole body revealed "1. Mixed response to therapy with some lesions decreased significantly in metabolic activity and others increased in metabolic activity as well size on the CT portion exam. 2. Expansion of lytic lesion at C3 with loss of a large portion of the vertebral body structure and intense metabolic activity. Recommend non emergent neuro surgical consultation for evaluation of this destructive C3 lesion. Dedicated CT or MRI of the neck may also provide more information. 3. Increase in size and metabolic activity of RIGHT iliac lesion and T5 vertebral body lesion. 4. Marked improvement with reduction in metabolic activity of RIGHT humerus and scapular  metastatic lesions as well as marked reduction in metabolic activity large LEFT iliac bone lesion. 5. No evidence of soft tissue plasmacytoma or metastasis."  #2 severe hypercalcemia likely due to progression of his multiple myeloma.  Mild confusion and significant dehydration.  #3 severe uncontrolled epistaxis likely from dry mucous membranes plus severe thrombocytopenia   PLAN: -Discussed lab work today and had an extensive goals of care discussion with the patient and his wife who was at the bedside. 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  Cytopenias have not improved off daratumumab.  He has continued to have refractory progressive hypercalcemia with intermittent metabolic encephalopathy.  His performance status has progressively declined with an ECOG PS of 3-4 at this time. I was asked to follow-up to redefine goals of care.  I picked up on my previous discussion  with the patient and his wife.  Patient looks very exhausted.  The patient and his wife are more clear today that they would not want to pursue any chemotherapy related options and prefer to focus on best supportive cares.  We explicitly discussed if they have arrived at the decision to pursue hospice.  Wife mentions that she would like to talk to couple of other family members but were likely to go down that route.  I answered her questions about treatment options.  We discussed that with his current refractory cytopenias and performance status and the very resistant nature of his 17 P mutated multiple myeloma the chances of rapid improvement or significant improvement would be low and the chances of additional treatment toxicities and more rapid clinical decline with treatment would be high. I provided emotional support and shared my unequivocal recommendation to consider best supportive cares through hospice at this time given that the time based supportive transfusions and hypercalcemia management and nutritional input have not gotten the patient to a better performance status which would entail additional treatment options. -Would recommend continued palliative care follow-up to help guide patient his wife and other family members towards a better understanding of the logistics involved with hospice cares.  Sullivan Lone MD MS . The total time spent in the appointment was 40 minutes and more than 50% was on counseling and direct patient cares.

## 2019-02-09 NOTE — Progress Notes (Addendum)
Manufacturing engineer [facility]) Hospital Liaison: RN note  Notified by Kathrin Greathouse [facility] CM/SW of patient/family request for [facility] services at home after discharge. Chart and patient information has been reviewed by [facility] physician and is Hospice eligible at this time. Family and TOC made aware.  Spoke with spouse Hawah via telephone to initiate education related to hospice philosophy, services and team approach to care including COVID pandemic restrictions. Made spouse aware [facility] is not offering volunteer services at this time and "virtual" visits are available upon request due to [place] pandemic. CNA services are intermittent and offered based on patient care plan. Hawah verbalized understanding of information given. Per discussion, plan is for possible discharge tomorrow to home by [facility].    Please send signed and completed DNR form home with patient/family. Patient will need prescriptions for discharge comfort medications.  DME needs have been discussed, patient currently has none in the home. Family requests the following DME for delivery to the home: Hospital bed, bedside table, BSC, and walker. [place] equipment manager has been notified and will contact [employer] to arrange delivery to the home once eligibility is confirmed. Home address has been verified and is correct in the chart. Spouse is the family member to contact to arrange time of delivery.[facility] aware of the above.   Please notify [facility] when patient is ready to leave the unit at discharge. (Call [phone] or [phone] after 5pm.)  [facility] information and contact numbers given to spouse during our conversation.   Please call with any hospice related questions.  Thank you for this referral,  Gar Ponto, RN  RN PRN/Hospital Liaison  [phone], [phone]

## 2019-02-09 NOTE — Progress Notes (Signed)
CRITICAL VALUE ALERT  Critical Value:  Hgb 6.4 &  Ca 13.1  Date & Time Notied:  02/09/19  0500  Provider Notified: Baltazar Najjar  Lab Messenger: Ubaldo Glassing  Orders Received/Actions taken: Awaiting orders

## 2019-02-09 NOTE — Progress Notes (Signed)
Palliative Care Follow-up Note  Appreciate the compassionate care provided to Mr. Lafleur during his stay and I support this transition to hospice care. I have reached out to his wife to see if there are any additional needs from our team and to provide additional support. Will sign off at this time, but please call us if we can be of further assistance.  Lane Hacker, DO Palliative Medicine

## 2019-02-09 NOTE — TOC Progression Note (Signed)
Transition of Care [facility]) - Progression Note    Patient Details  Name: Jesus [name] MRN: [id number] Date of Birth: January 17, 1956  Transition of Care [facility]) CM/SW [place], [place] Phone Number: 02/09/2019, 12:58 PM  Clinical Narrative:    CSW reached out to  liaison Gar Ponto, RN and notified her the patient and spouse are agreeable to Home with Hospice care. She will follow up with the patient and spouse today.    Expected Discharge Plan: Home w Hospice Care Barriers to Discharge: Barriers Resolved  Expected Discharge Plan and Services Expected Discharge Plan: [place] Determinants of Health (SDOH) Interventions    Readmission Risk Interventions Readmission Risk Prevention Plan 11/07/2018  Transportation Screening Complete  PCP or Specialist Appt within 5-7 Days Complete  Home Care Screening Complete  Medication Review (RN CM) Complete

## 2019-02-10 ENCOUNTER — Telehealth: Payer: Self-pay | Admitting: *Deleted

## 2019-02-10 ENCOUNTER — Ambulatory Visit: Payer: Self-pay

## 2019-02-10 ENCOUNTER — Encounter: Payer: Self-pay | Admitting: Pharmacy Technician

## 2019-02-10 ENCOUNTER — Other Ambulatory Visit: Payer: Self-pay

## 2019-02-10 ENCOUNTER — Ambulatory Visit: Payer: Self-pay | Admitting: Hematology

## 2019-02-10 DIAGNOSIS — Z515 Encounter for palliative care: Secondary | ICD-10-CM

## 2019-02-10 LAB — CBC WITH DIFFERENTIAL/PLATELET
Abs Immature Granulocytes: 0 10*3/uL (ref 0.00–0.07)
Basophils Absolute: 0 10*3/uL (ref 0.0–0.1)
Basophils Relative: 0 %
Eosinophils Absolute: 0 10*3/uL (ref 0.0–0.5)
Eosinophils Relative: 1 %
HCT: 23.9 % — ABNORMAL LOW (ref 39.0–52.0)
Hemoglobin: 7.7 g/dL — ABNORMAL LOW (ref 13.0–17.0)
Lymphocytes Relative: 33 %
Lymphs Abs: 0.6 10*3/uL — ABNORMAL LOW (ref 0.7–4.0)
MCH: 29.5 pg (ref 26.0–34.0)
MCHC: 32.2 g/dL (ref 30.0–36.0)
MCV: 91.6 fL (ref 80.0–100.0)
Monocytes Absolute: 0.3 10*3/uL (ref 0.1–1.0)
Monocytes Relative: 16 %
Myelocytes: 2 %
Neutro Abs: 0.9 10*3/uL — ABNORMAL LOW (ref 1.7–7.7)
Neutrophils Relative %: 48 %
Platelets: 68 10*3/uL — ABNORMAL LOW (ref 150–400)
RBC: 2.61 MIL/uL — ABNORMAL LOW (ref 4.22–5.81)
RDW: 16.1 % — ABNORMAL HIGH (ref 11.5–15.5)
WBC: 1.9 10*3/uL — ABNORMAL LOW (ref 4.0–10.5)
nRBC: 1 % — ABNORMAL HIGH (ref 0.0–0.2)

## 2019-02-10 MED ORDER — HEPARIN SOD (PORK) LOCK FLUSH 100 UNIT/ML IV SOLN
500.0000 [IU] | INTRAVENOUS | Status: AC | PRN
  Administered 2019-02-10: 500 [IU]
  Filled 2019-02-10: qty 5

## 2019-02-10 MED ORDER — OXYCODONE HCL 15 MG PO TABS: 15.0000 mg | ORAL_TABLET | ORAL | 0 refills | Status: AC | PRN

## 2019-02-10 MED ORDER — LORAZEPAM 2 MG/ML PO CONC: 0.6000 mg | Freq: Three times a day (TID) | ORAL | 0 refills | Status: AC | PRN

## 2019-02-10 MED ORDER — ONDANSETRON HCL 4 MG PO TABS: 4.0000 mg | ORAL_TABLET | Freq: Three times a day (TID) | ORAL | 0 refills | Status: AC | PRN

## 2019-02-10 NOTE — Telephone Encounter (Signed)
Contacted by [place] Shi with [facility]. Patient is referred to hospice on discharge and may be discharged to day. Family requested Dr. Irene Limbo serve as attending.  [facility] needs confirmation that Dr. Irene Limbo will serve as attending, certification of terminal illness and an order to admit to hospice. Dr. Irene Limbo informed. Per Dr. Irene Limbo - contacted Ms Earleen Newport on [facility] #  Given [phone] to state that Dr. Irene Limbo will admit to hospice care. Per Ms. Earleen Newport, Dr. Clifton James will provide coverage over weekend if patient needs anything and they will fax orders for comfort care to Dr. Irene Limbo for signature.  Gave verbal order to admit to hospice to [facility]

## 2019-02-10 NOTE — TOC Transition Note (Addendum)
Transition of Care [facility]) - CM/SW Discharge Note   Patient Details  Name: Jesus [name] MRN: [id number] Date of Birth: Dec 22, 1955  Transition [facility]) CM/SW Contact:  Lia Hopping, [place] Phone Number: 02/10/2019, 9:39 AM   Clinical Narrative:    DME-Hospital Bed will be delivered at 3:00pm today. Spouse aware. She requests patient to be picked up at 4:00pm.  PTAR arranged to transport.  DNR signed.     Final next level of care: Home w Hospice Care Barriers to Discharge: Barriers Resolved   Patient Goals and CMS Choice        Discharge Placement                       Discharge Plan and Services                  DME Agency: [facility] Date DME Agency Contacted: 02/09/19   Representative spoke with at DME Agency: [place] Determinants of Health ([place]) Interventions     Readmission Risk Interventions Readmission Risk Prevention Plan 11/07/2018  Transportation Screening Complete  PCP or Specialist Appt within 5-7 Days Complete  Home Care Screening Complete  Medication Review (RN CM) Complete

## 2019-02-10 NOTE — Discharge Summary (Signed)
Physician Discharge Summary  Jesus [name] NOB:[id number] DOB: [birth year] DOA: 01/11/2019  PCP: Patient, No Pcp Per  Admit date: 01/11/2019 Discharge date: 02/10/2019  Admitted From: home Disposition:  Home with hospice   Recommendations for Outpatient Follow-up:  1. Follow up with hospice services  Home Health: none Equipment/Devices: hospice related devices, hospital bed, commode, etc  Discharge Condition: on hospice CODE STATUS: DNR Diet recommendation: comfort feeding   HPI: Per admitting MD, Jesus [name] is a 64 y.o. male with medical history significant of Multiple Myeloma, Arthritis, cervical stenosis who presents with nosebleed.  Patient reports hie nosebleed began last night around 10 PM.  He could not sleep throughout the night as he had profuse bleeding.  He and his wife attempted to stop the bleeding with pressure and paper towels.  They reported trying a spray and ice.  They came in this morning due to failure of their times.  Patient had a similar episode 2 weeks prior where bleeding lasted 8 hours before it finally stopped.  Apart from that it has been a while since he has had an episode this bad.  He has had poor appetite for the past couple of weeks.  He has been trying to supplement with Ensure.  His wife reports that he was a little more confused this morning, not knowing his hematologist name. He denies any fevers, but reports being chronically cold.  He denies any chest pain, shortness of breath, cough.  No nausea, vomiting, diarrhea. He denies any tobacco, alcohol, drug use He lives at home and is independent in his ADLs  Hospital Course / Discharge diagnoses: Severe hypercalcemia of malignancy /underlying multiple myeloma-secondary to progressive multiple myeloma, his hyperglycemia has been extremely resistant to treatment, he received 3 doses of Zometa, calcitonin, fluids and Lasix.  Nephrology was also consulted to assist but given her refractory disease to  treatment they signed off as they had nothing further to offer.  He remains persistently hypercalcemic.  Oncology and palliative were also consulted, and Dr. Irene Limbo along with Dr. 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  Bone marrow biopsy on 01/16/2019 showed persistent involvement by plasma cell myeloma as well as mild myeloid dysplasia. Acute kidney injury-resolved Hypokalemia, hypomagnesemia, hypophosphatemia-replaced Epistaxis-on admission, due to severe thrombocytopenia, resolved and has no further bleeding Acute metabolic encephalopathy-likely multifactorial, intermittently confused.  EEG with generalized encephalopathy without evidence of epileptiform activities, CT head without acute changes.  Will become progressively more somnolent as he is hypercalcemia is bound to worsen Severe protein calorie malnutrition -comfort feeding   Discharge Instructions   Allergies as of 02/10/2019      Reactions   Penicillins Tinitus   Did it involve swelling of the face/tongue/throat, SOB, or low BP? No Did it involve sudden or severe rash/hives, skin peeling, or any reaction on the inside of your mouth or nose? No Did you need to seek medical attention at a hospital or doctor's office? Yes When did it last happen? More than 30 years ago If all above answers are "NO", may proceed with cephalosporin use.      Medication List    STOP taking these medications   Calcium 600+D3 600-800 MG-UNIT Tabs Generic  drug: Calcium Carb-Cholecalciferol   dexamethasone 4 MG tablet Commonly known as: DECADRON   ergocalciferol 1.25 MG (50000 UT)  capsule Commonly known as: VITAMIN D2   gabapentin 300 MG capsule Commonly known as: Neurontin   methocarbamol 500 MG tablet Commonly known as: ROBAXIN   morphine 30 MG 12 hr tablet Commonly known as: MS CONTIN   sucralfate 1 g tablet Commonly known as: Carafate     TAKE these medications   acetaminophen 325 MG tablet Commonly known as: TYLENOL Take 325-650 mg by mouth every 6 (six) hours as needed (pain.).   acyclovir 400 MG tablet Commonly known as: ZOVIRAX Take 1 tablet (400 mg total) by mouth 2 (two) times daily.   dronabinol 10 MG capsule Commonly known as: MARINOL TAKE 1 CAPSULE BY MOUTH TWICE DAILY BEFORE MEAL(S) What changed: See the new instructions.   LORazepam 2 MG/ML concentrated solution Commonly known as: ATIVAN Take 0.3 mLs (0.6 mg total) by mouth every 8 (eight) hours as needed for anxiety.   ondansetron 4 MG tablet Commonly known as: Zofran Take 1 tablet (4 mg total) by mouth every 8 (eight) hours as needed for nausea or vomiting.   oxyCODONE 15 MG immediate release tablet Commonly known as: ROXICODONE Take 1 tablet (15 mg total) by mouth every 4 (four) hours as needed for up to 5 days for moderate pain. What changed: reasons to take this   senna-docusate 8.6-50 MG tablet Commonly known as: Senokot-S Take 1 tablet by mouth daily.       Consultations:  Oncology   Palliative   Procedures/Studies:  CT HEAD WO CONTRAST  Result Date: 01/24/2019 CLINICAL DATA:  Delirium EXAM: CT HEAD WITHOUT CONTRAST TECHNIQUE: Contiguous axial images were obtained from the base of the skull through the vertex without intravenous contrast. COMPARISON:  None. FINDINGS: Brain: No evidence of acute infarction, hemorrhage, hydrocephalus, extra-axial collection or mass lesion/mass effect. Vascular: Negative for hyperdense vessel Skull: Negative Sinuses/Orbits: Paranasal sinuses clear. Negative orbit bilaterally. Bilateral cataract surgery. Other: None IMPRESSION:  Negative CT head.  No acute abnormality Electronically Signed   By: Franchot Gallo M.D.   On: 01/24/2019 14:38   CT ANGIO CHEST PE W OR WO CONTRAST  Result Date: 02/08/2019 CLINICAL DATA:  Shortness of breath with history of multiple myeloma. Elevated D-dimer. Severe thrombocytopenia and anemia. EXAM: CT ANGIOGRAPHY CHEST WITH CONTRAST TECHNIQUE: Multidetector CT imaging of the chest was performed using the standard protocol during bolus administration of intravenous contrast. Multiplanar CT image reconstructions and MIPs were obtained to evaluate the vascular anatomy. CONTRAST:  3m OMNIPAQUE IOHEXOL 350 MG/ML SOLN COMPARISON:  None. FINDINGS: Cardiovascular: Evaluation for pulmonary emboli is limited by significant respiratory motion artifact.Given this limitation, no large centrally located pulmonary embolism was detected. Detection of smaller pulmonary emboli is limited. The main pulmonary artery is within normal limits for size. There is no CT evidence of acute right heart strain. There is no evidence for thoracic aortic aneurysm or dissection. Aortic calcifications are noted. Heart size is normal. There is no significant pericardial effusion. There is a well-positioned right-sided Port-A-Cath in place. Mediastinum/Nodes: --No mediastinal or hilar lymphadenopathy. --No axillary lymphadenopathy. --No supraclavicular lymphadenopathy. --Normal thyroid gland. --The esophagus is unremarkable Lungs/Pleura: There is no pneumothorax. No large pleural effusion. Atelectasis is noted at the lung bases, left worse than right. The trachea is unremarkable. Upper Abdomen: No acute abnormality. Musculoskeletal: There are extensive lytic lesions scattered throughout the visualized osseous structures. Multiple lytic soft tissue masses are noted involving the ribs,  greatest on the left. There is age-indeterminate height loss of the T5 vertebral body. The patient is status post posterior fusion of the mid to lower thoracic  spine. A large lytic lesion is noted in the T10 vertebral body. The hardware appears grossly intact. Review of the MIP images confirms the above findings. IMPRESSION: 1. Evaluation for pulmonary emboli is limited by respiratory motion artifact. Given this limitation, no PE was identified. 2. Bibasilar atelectasis, otherwise the lungs are essentially clear. 3. Extensive lytic lesions involving the osseous structures with multiple lytic soft tissue masses involving the ribs is consistent with the patient's history of multiple myeloma Aortic Atherosclerosis (ICD10-I70.0). Electronically Signed   By: Constance Holster M.D.   On: 02/08/2019 19:54   NM Bone Scan Whole Body  Result Date: 01/12/2019 CLINICAL DATA:  History of multiple myeloma with acute back pain. EXAM: NUCLEAR MEDICINE WHOLE BODY BONE SCAN TECHNIQUE: Whole body anterior and posterior images were obtained approximately 3 hours after intravenous injection of radiopharmaceutical. RADIOPHARMACEUTICALS:  21.8 mCi Technetium-18mMDP IV COMPARISON:  PET-CT 10/13/2018 FINDINGS: Examination demonstrates patchy increased uptake over the mid to lower thoracic spine over known areas of fusion hardware and bone destruction representing patient's known multiple myeloma. Mild uptake over the right acetabulum likely representing patient's multiple myeloma. Minimal hazy uptake on the anterior image at the cervicothoracic junction which may represent patient's known myelomatous involvement of the cervical spine. Minimal patchy uptake over several anterior upper ribs bilaterally which may be due to patient's myomatous disease. Minimal degenerative changes over the shoulders and knees. IMPRESSION: 1. Uptake over the right acetabulum, mid to lower thoracic spine, upper anterior ribs and possibly cervicothoracic junction likely representing patient's known multiple myeloma. 2.  Degenerative changes as described. Electronically Signed   By: DMarin OlpM.D.   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 He presents with pancytopenia and severe hypercalcemia. He requires bone marrow biopsy to evaluate for progression of disease. EXAM: CT GUIDED BONE MARROW ASPIRATION AND CORE BIOPSY Interventional Radiologist:  HCriselda Peaches MD MEDICATIONS: None. ANESTHESIA/SEDATION: Moderate (conscious) sedation was employed during this procedure. A total of 2 milligrams versed and 100 micrograms fentanyl were administered intravenously. The patient's level of consciousness and vital signs were monitored continuously by radiology nursing throughout the procedure under my direct supervision. Total monitored sedation time: 10 minutes FLUOROSCOPY TIME:  None COMPLICATIONS: None immediate. Estimated blood loss: <25 mL PROCEDURE: Informed written consent was obtained from the patient after a thorough discussion of the procedural risks, benefits and alternatives. All questions were addressed. Maximal Sterile Barrier Technique was utilized including caps, mask, sterile gowns, sterile gloves, sterile drape, hand hygiene and skin antiseptic. A timeout was performed prior to the initiation of the procedure. The patient was positioned prone and non-contrast localization CT was performed of the pelvis to demonstrate the iliac marrow spaces. Maximal barrier sterile technique utilized including caps, mask, sterile gowns, sterile gloves, large sterile drape, hand hygiene, and betadine prep. Under sterile conditions and local anesthesia, an 11 gauge coaxial bone biopsy needle was advanced into the right iliac marrow space. Needle position was confirmed with CT imaging. Initially, bone marrow aspiration was performed. Next, the 11 gauge outer cannula was utilized to obtain a right iliac bone marrow core biopsy. Needle was removed. Hemostasis was obtained with  compression. The patient tolerated the procedure well. Samples were prepared with the  cytotechnologist. IMPRESSION: Technically successful CT-guided bone marrow biopsy and aspiration. Signed, Criselda Peaches, MD, [facility] Radiology Electronically Signed   By: Jacqulynn Cadet M.D.   On: 01/16/2019 10:58   DG Swallowing Func-Speech Pathology  Result Date: 01/16/2019 Objective Swallowing Evaluation: Type of Study: Bedside Swallow Evaluation  Patient Details Name: TOSHIYUKI FREDELL MRN: [id number] Date of Birth: 10/02/1955 Today's Date: 01/16/2019 Time: SLP Start Time (ACUTE ONLY): 1305 -SLP Stop Time (ACUTE ONLY): 1335 SLP Time Calculation (min) (ACUTE ONLY): 30 min Past Medical History: Past Medical History: Diagnosis Date . Arthritis  . Cancer ([place])   multiple myeloma . Cervical stenosis of spinal canal  . Wears glasses  Past Surgical History: Past Surgical History: Procedure Laterality Date . ANTERIOR CERVICAL CORPECTOMY N/A 11/04/2018  Procedure: Cervical three Corpectomy with Cervical two to Cervical four Plating;  Surgeon: Erline Levine, MD;  Location: [place];  Service: Neurosurgery;  Laterality: N/A; . APPENDECTOMY   . BACK SURGERY   . EYE SURGERY Bilateral   cataract surgery with lens implants . HERNIA REPAIR Left   inguinal  . IR IMAGING GUIDED PORT INSERTION  06/21/2018 . ROTATOR CUFF REPAIR Left  HPI: Patient is a 64 y.o. male with PMH: multiple myeloma, arthritis, cervical stenosis, who presented to hospital with nosebleed which was not able to be controlled at home despite attempts with pressure, paper towels and ice. He had similar episodes two weeks prior where bleeding lasted 8 hours before it finally stopped. Patient has had poor appetite for past couple weeks. Wife reported he seemed more confused in AM of admission. on 12/11, RN reported patient with significant difficulty with swallowing and unable to take medications and so SLP BSE  ordered.  Pt underwent BSE on 01/14/2019 and was found to be too high risk for aspiration with recommendation for npo.  Subjective: pt awake in chair in flouro suite Assessment / Plan / Recommendation CHL IP CLINICAL IMPRESSIONS 01/16/2019 Clinical Impression Pt presents with minimal oropharyngeal dysphagia without aspiration or laryngeal penetration of any consistency tested.  In general, pt's swallow is strong and timely with adequate airway protection even with sequential liquid bolus swallows.  He did demonstrate difficulty orally transiting tablet with thin barium - and after 3 unsuccesful attempts, pudding effective to transit.  In addition, decreased UES relaxation noted when swallowing masticated cracker which results in residuals without pt awareness.  Liquid swallow faciliated clearance but also resulted in trace backflow, thus recommend strict esophageal precautions.  Given pt's generalized weakness and decreased UES clearance with solids (not sensate) - dys3 diet advised at this time.  Medicine with puree- whole - starting and following with liquids.  Of note, pt did NOT cough during entire MBS - symptoms of cough with liquids reported prior to admission and during hospitalization.  SLP educated pt and his wife (present for test) to findings/recommendations.  Will follow up for dysphagia management/treatment. Anticipate pt will benefit from dysphagia treatment given his XRT to pharyngeal region. SLP Visit Diagnosis Dysphagia, oropharyngeal phase (R13.12) Attention and concentration deficit following -- Frontal lobe and executive function deficit following -- Impact on safety and function Mild aspiration risk   CHL IP TREATMENT RECOMMENDATION 01/16/2019 Treatment Recommendations Therapy as outlined in treatment plan below   Prognosis 01/16/2019 Prognosis for Safe Diet Advancement Good Barriers to Reach Goals -- Barriers/Prognosis Comment -- CHL IP DIET RECOMMENDATION 01/16/2019 SLP Diet Recommendations  Dysphagia 3 (Mech soft) solids;Thin liquid Liquid Administration via Cup;Straw Medication Administration Whole  meds with puree Compensations Small sips/bites;Slow rate;Effortful swallow;Minimize environmental distractions Postural Changes --   CHL IP OTHER RECOMMENDATIONS 01/16/2019 Recommended Consults -- Oral Care Recommendations Oral care BID Other Recommendations Clarify dietary restrictions   CHL IP FOLLOW UP RECOMMENDATIONS 01/16/2019 Follow up Recommendations Skilled Nursing facility   [facility] IP FREQUENCY AND DURATION 01/16/2019 Speech Therapy Frequency (ACUTE ONLY) min 2x/week Treatment Duration 2 weeks      CHL IP ORAL PHASE 01/16/2019 Oral Phase Impaired Oral - Pudding Teaspoon -- Oral - Pudding Cup -- Oral - Honey Teaspoon -- Oral - Honey Cup -- Oral - Nectar Teaspoon -- Oral - Nectar Cup WFL Oral - Nectar Straw -- Oral - Thin Teaspoon -- Oral - Thin Cup WFL Oral - Thin Straw WFL Oral - Puree WFL Oral - Mech Soft WFL Oral - Regular -- Oral - Multi-Consistency -- Oral - Pill Reduced posterior propulsion;Weak lingual manipulation Oral Phase - Comment pt needed pudding to orally transit tablet after unable x3 with liquids  CHL IP PHARYNGEAL PHASE 01/16/2019 Pharyngeal Phase WFL;Impaired Pharyngeal- Pudding Teaspoon -- Pharyngeal -- Pharyngeal- Pudding Cup -- Pharyngeal -- Pharyngeal- Honey Teaspoon -- Pharyngeal -- Pharyngeal- Honey Cup -- Pharyngeal -- Pharyngeal- Nectar Teaspoon -- Pharyngeal -- Pharyngeal- Nectar Cup [facility] Pharyngeal Material does not enter airway Pharyngeal- Nectar Straw -- Pharyngeal -- Pharyngeal- Thin Teaspoon -- Pharyngeal -- Pharyngeal- Thin Cup [facility] Pharyngeal Material does not enter airway Pharyngeal- Thin Straw WFL Pharyngeal Material does not enter airway Pharyngeal- Puree WFL Pharyngeal Material does not enter airway Pharyngeal- Mechanical Soft Pharyngeal residue - cp segment Pharyngeal Material does not enter airway Pharyngeal- Regular NT Pharyngeal -- Pharyngeal- Multi-consistency  -- Pharyngeal -- Pharyngeal- Pill WFL Pharyngeal Material does not enter airway Pharyngeal Comment --  CHL IP CERVICAL ESOPHAGEAL PHASE 01/16/2019 Cervical Esophageal Phase Impaired Pudding Teaspoon -- Pudding Cup -- Honey Teaspoon -- Honey Cup -- Nectar Teaspoon -- Nectar Cup WFL Nectar Straw -- Thin Teaspoon -- Thin Cup WFL Thin Straw Reduced cricopharyngeal relaxation;Esophageal backflow into cervical esophagus Puree WFL Mechanical Soft Reduced cricopharyngeal relaxation;Esophageal backflow into cervical esophagus Regular -- Multi-consistency -- Pill WFL Cervical Esophageal Comment pt without sensation to minimal residuals at pyriform/above UES region, liquid assisted clearance but also resulted in trace backflow, barium tablet swallowed with pudding appeared to transit into sttomach without delay Kathleen Lime, MS [facility] SLP Acute Rehab Services Office [phone] [name] 01/16/2019, 2:19 PM              EEG adult  Result Date: 01/24/2019 Lora Havens, MD     01/24/2019  6:58 PM Patient Name: OMERE MARTI MRN: [id number] Epilepsy Attending: Lora Havens Referring Physician/Provider: Dr Jacki Cones Date: 01/24/2019 Duration: 22.09 mins Patient history: 64yo M with ams and jerking. EEG to evaluate for seizure Level of alertness: awake AEDs during EEG study: Lorazepam Technical aspects: This EEG study was done with scalp electrodes positioned according to the 10-20 International system of electrode placement. Electrical activity was acquired at a sampling rate of '500Hz'  and reviewed with a high frequency filter of '70Hz'  and a low frequency filter of '1Hz' . EEG data were recorded continuously and digitally stored. DESCRIPTION: No clear posterior dominant rhythm was seen. EEG showed continuous generalized 3-'5hz'  theta-delta slowing. Hyperventilation and photic stimulation were not performed. ABNORMALITY - Continuous slow, generalized IMPRESSION: This study is suggestive of moderate diffuse  encephalopathy, non specific to etiology. No seizures or epileptiform discharges were seen throughout the recording. [place]   CT BONE MARROW  BIOPSY & ASPIRATION  Result Date: 01/16/2019 INDICATION: 64 year old male with a history of multiple myeloma status post stem cell transplant and chemotherapy. He presents with pancytopenia and severe hypercalcemia. He requires bone marrow biopsy to evaluate for progression of disease. EXAM: CT GUIDED BONE MARROW ASPIRATION AND CORE BIOPSY Interventional Radiologist:  Criselda Peaches, MD MEDICATIONS: None. ANESTHESIA/SEDATION: Moderate (conscious) sedation was employed during this procedure. A total of 2 milligrams versed and 100 micrograms fentanyl were administered intravenously. The patient's level of consciousness and vital signs were monitored continuously by radiology nursing throughout the procedure under my direct supervision. Total monitored sedation time: 10 minutes FLUOROSCOPY TIME:  None COMPLICATIONS: None immediate. Estimated blood loss: <25 mL PROCEDURE: Informed written consent was obtained from the patient after a thorough discussion of the procedural risks, benefits and alternatives. All questions were addressed. Maximal Sterile Barrier Technique was utilized including caps, mask, sterile gowns, sterile gloves, sterile drape, hand hygiene and skin antiseptic. A timeout was performed prior to the initiation of the procedure. The patient was positioned prone and non-contrast localization CT was performed of the pelvis to demonstrate the iliac marrow spaces. Maximal barrier sterile technique utilized including caps, mask, sterile gowns, sterile gloves, large sterile drape, hand hygiene, and betadine prep. Under sterile conditions and local anesthesia, an 11 gauge coaxial bone biopsy needle was advanced into the right iliac marrow space. Needle position was confirmed with CT imaging. Initially, bone marrow aspiration was performed. Next, the 11  gauge outer cannula was utilized to obtain a right iliac bone marrow core biopsy. Needle was removed. Hemostasis was obtained with compression. The patient tolerated the procedure well. Samples were prepared with the cytotechnologist. IMPRESSION: Technically successful CT-guided bone marrow biopsy and aspiration. Signed, Criselda Peaches, MD, [facility] Radiology Electronically Signed   By: Jacqulynn Cadet M.D.   On: 01/16/2019 10:58      Subjective: - no chest pain, shortness of breath, no abdominal pain, nausea or vomiting.   Discharge Exam: BP 126/82 (BP Location: Right Arm)   Pulse 100   Temp 98.1 F (36.7 C) (Oral)   Resp 18   Ht '5\' 6"'  (1.676 m) Comment: patient stated that he was 5'6"  Wt 46.2 kg   SpO2 100%   BMI 16.44 kg/m   General: Pt is alert, awake, not in acute distress Cardiovascular: RRR, S1/S2 +, no rubs, no gallops Respiratory: CTA bilaterally, no wheezing, no rhonchi Abdominal: Soft, NT, ND, bowel sounds +  The results of significant diagnostics from this hospitalization (including imaging, microbiology, ancillary and laboratory) are listed below for reference.     Microbiology: No results found for this or any previous visit (from the past 240 hour(s)).   Labs: Basic Metabolic Panel: Recent Labs  Lab 02/05/19 0415 02/06/19 0416 02/07/19 0401 02/08/19 0251 02/09/19 0343  NA 133* 132* 132* 131* 136  K 4.1 3.8 3.7 3.4* 3.4*  CL 103 101 100 96* 101  CO2 '25 26 27 28 29  ' GLUCOSE 104* 99 105* 118* 121*  BUN '8 10 11 11 18  ' CREATININE 0.57* 0.55* 0.52* 0.54* 0.42*  CALCIUM 13.8* 13.9* 14.1* 14.2* 13.1*   Liver Function Tests: Recent Labs  Lab 02/05/19 0415 02/06/19 0416 02/07/19 0401 02/08/19 0251 02/09/19 0343  AST 31 31 34 39 39  ALT 51* 44 44 51* 48*  ALKPHOS 335* 310* 329* 340* 303*  BILITOT 0.4 0.3 0.7 0.9 0.8  PROT 7.9 7.8 8.0 8.1 7.5  ALBUMIN 2.9*  2.9* 2.9* 3.0* 2.9*    CBC: Recent Labs  Lab 02/06/19 0416 02/07/19 0401 02/08/19 0251 02/09/19 0343 02/10/19 0434  WBC 2.1* 2.5* 2.7* 2.6* 1.9*  NEUTROABS 1.0* 1.1* 1.2* 1.2* 0.9*  HGB 8.9* 8.7* 8.3* 6.4* 7.7*  HCT 27.3* 26.9* 25.1* 19.8* 23.9*  MCV 90.1 90.6 89.0 90.8 91.6  PLT 25* 17* 9* 109* 68*   CBG: No results for input(s): GLUCAP in the last 168 hours. Hgb A1c No results for input(s): HGBA1C in the last 72 hours. Lipid Profile No results for input(s): CHOL, HDL, LDLCALC, TRIG, CHOLHDL, LDLDIRECT in the last 72 hours. Thyroid function studies No results for input(s): TSH, T4TOTAL, T3FREE, THYROIDAB in the last 72 hours.  Invalid input(s): FREET3 Urinalysis    Component Value Date/Time   COLORURINE STRAW (A) 01/24/2019 1304   APPEARANCEUR CLEAR 01/24/2019 1304   LABSPEC 1.006 01/24/2019 1304   PHURINE 7.0 01/24/2019 1304   GLUCOSEU NEGATIVE 01/24/2019 1304   HGBUR NEGATIVE 01/24/2019 1304   BILIRUBINUR NEGATIVE 01/24/2019 1304   KETONESUR NEGATIVE 01/24/2019 1304   PROTEINUR NEGATIVE 01/24/2019 1304   NITRITE NEGATIVE 01/24/2019 1304   LEUKOCYTESUR SMALL (A) 01/24/2019 1304    FURTHER DISCHARGE INSTRUCTIONS:   Get Medicines reviewed and adjusted: Please take all your medications with you for your next visit with your Primary MD   Laboratory/radiological data: Please request your Primary MD to go over all hospital tests and procedure/radiological results at the follow up, please ask your Primary MD to get all Hospital records sent to his/her office.   In some cases, they will be blood work, cultures and biopsy results pending at the time of your discharge. Please request that your primary care M.D. goes through all the records of your hospital data and follows up on these results.   Also Note the following: If you experience worsening of your admission symptoms, develop shortness of breath, life threatening emergency, suicidal or homicidal thoughts you must seek medical  attention immediately by calling 911 or calling your MD immediately  if symptoms less severe.   You must read complete instructions/literature along with all the possible adverse reactions/side effects for all the Medicines you take and that have been prescribed to you. Take any new Medicines after you have completely understood and accpet all the possible adverse reactions/side effects.    Do not drive when taking Pain medications or sleeping medications (Benzodaizepines)   Do not take more than prescribed Pain, Sleep and Anxiety Medications. It is not advisable to combine anxiety,sleep and pain medications without talking with your primary care practitioner   Special Instructions: If you have smoked or chewed Tobacco  in the last 2 yrs please stop smoking, stop any regular Alcohol  and or any Recreational drug use.   Wear Seat belts while driving.   Please note: You were cared for by a hospitalist during your hospital stay. Once you are discharged, your primary care physician will handle any further medical issues. Please note that NO REFILLS for any discharge medications will be authorized once you are discharged, as it is imperative that you return to your primary care physician (or establish a relationship with a primary care physician if you do not have one) for your post hospital discharge needs so that they can reassess your need for medications and monitor your lab values.  Time coordinating discharge: 40 minutes  SIGNED:  Marzetta Board, MD, PhD 02/10/2019, 2:09 PM

## 2019-02-12 LAB — BPAM RBC
Blood Product Expiration Date: 202102042359
Blood Product Expiration Date: 202102122359
ISSUE DATE / TIME: 202101072007
Unit Type and Rh: 5100
Unit Type and Rh: 600

## 2019-02-12 LAB — TYPE AND SCREEN
ABO/RH(D): A POS
Antibody Screen: POSITIVE
Unit division: 0
Unit division: 0

## 2019-02-20 ENCOUNTER — Telehealth: Payer: Self-pay | Admitting: *Deleted

## 2019-02-20 NOTE — Telephone Encounter (Signed)
Received Telephone Advice fax from after hours [facility] Call Center:  [facility] staff Joelene Millin called after hours line Mar 16, 2019 @ 7:08 PM to notify office that patient passed away

## 2019-02-24 ENCOUNTER — Ambulatory Visit: Payer: Self-pay

## 2019-02-24 ENCOUNTER — Other Ambulatory Visit: Payer: Self-pay

## 2019-02-24 ENCOUNTER — Ambulatory Visit: Payer: Self-pay | Admitting: Hematology

## 2019-03-06 DEATH — deceased

## 2019-03-23 NOTE — Progress Notes (Signed)
  Radiation Oncology         [phone] ________________________________  Name: Jesus [name] MRN: [id number]  Date: 12/20/2018  DOB: [birth year]  End of Treatment Note  Diagnosis:   Multiple myeloma     Indication for treatment::  palliative       Radiation treatment dates:   11/24/18 - 12/20/18  Site/dose:    1.  The C-spine at C3 was treated to a dose of 35 Gy in 14 fractions using a 3-field technique.  2.  The T-spine at T5 was treated to a dose of 35 Gy in 14 fractions using a 2-field technique.  3.  The right pelvis was treated to a dose of 35 Gy in 14 fractions using a 3-field technique.    Narrative: The patient tolerated radiation treatment relatively well.     Plan: The patient has completed radiation treatment. The patient will return to radiation oncology clinic for routine followup in one month. I advised the patient to call or return sooner if they have any questions or concerns related to their recovery or treatment. ________________________________  Jodelle Gross, M.D., Ph.D.

## 2019-04-06 NOTE — Progress Notes (Signed)
  Radiation Oncology         [phone] ________________________________  Name: Jesus [name] MRN: [id number]  Date: 12/09/2018  DOB: December 27, 1955  SIMULATION AND TREATMENT PLANNING NOTE  DIAGNOSIS:     ICD-10-CM   1. Multiple myeloma not having achieved remission ([place])  C90.00      Site:   1.  C-spine 2.  T-spine  NARRATIVE:  The patient was brought to the [facility] for a resimulation which was medically necessary after a gap in his treatment.  Identity was confirmed.  All relevant records and images related to the planned course of therapy were reviewed.   Written consent to proceed with treatment was confirmed which was freely given after reviewing the details related to the planned course of therapy had been reviewed with the patient.  Then, the patient was set-up in a stable reproducible  supine position for radiation therapy.  CT images were obtained.  Surface markings were placed.    Medically necessary complex treatment device(s) for immobilization:   1.  accuform device 2. Vac-lock bag.   The CT images were loaded into the planning software.  Then the target and avoidance structures were contoured.  Treatment planning then occurred.  The radiation prescription was entered and confirmed.  A total of 5 complex treatment devices were fabricated which relate to the designed radiation treatment fields. Each of these customized fields/ complex treatment devices will be used on a daily basis during the radiation course. I have requested : Isodose Plan.   PLAN:  The patient will receive 17.5 Gy Gy in 7 fractions to complete the remainder of his XRT course.  ________________________________   Jodelle Gross, MD, PhD

## 2020-02-23 IMAGING — CT CT BONE MARROW BIOPSY AND ASPIRATION
1 of 2 series · 15 of 28 positions shown, 19 images · non-contrast
Comparison: none

INDICATION: History of multiple myeloma. Please perform CT-guided bone marrow
biopsy for tissue diagnostic purposes.

[Series 2: i-spiral 5.0 b40f · axial · 0.98mm/px · z∈[-83,-16]mm · 15 of 22 slices shown, 19 images]
[im 2/22  mediastinal]
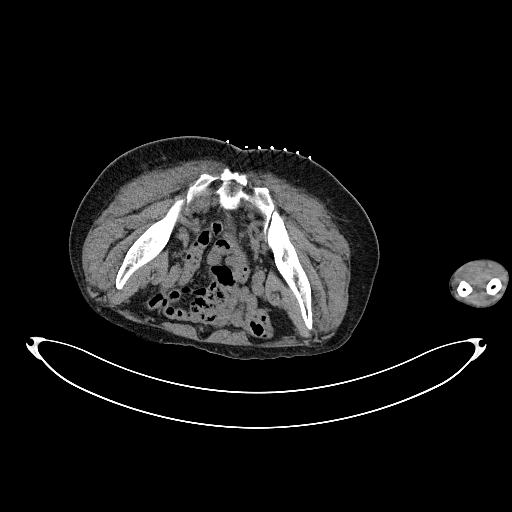
[im 2/22  lung]
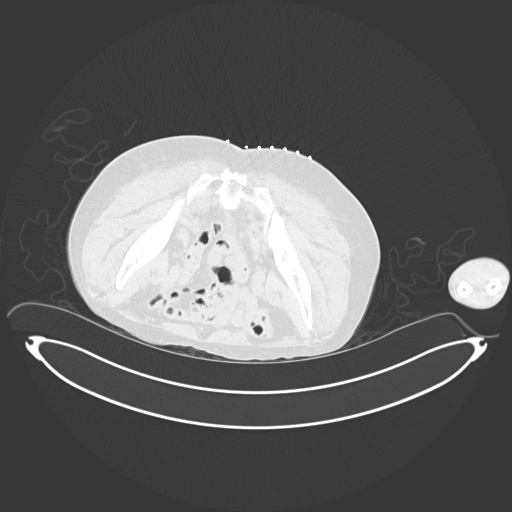
[im 4/22  lung]
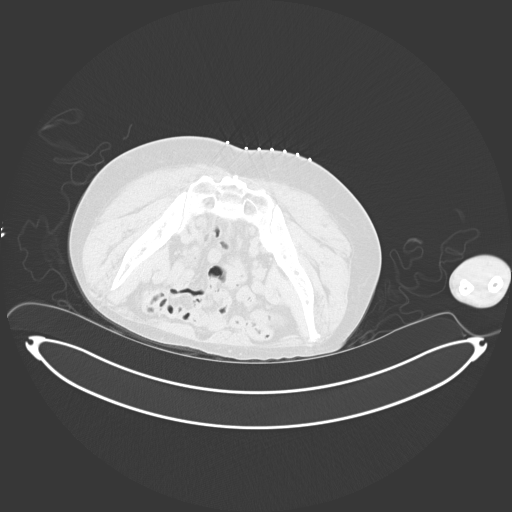
[im 5/22  lung]
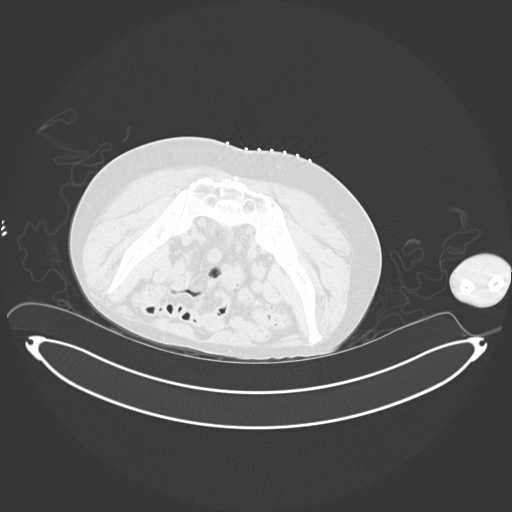
[im 6/22  lung]
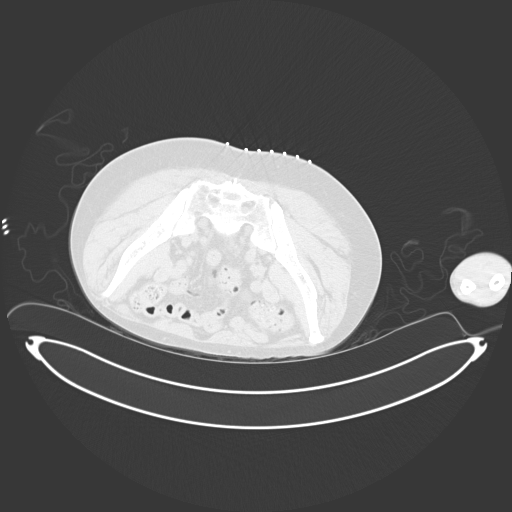
[im 8/22  mediastinal]
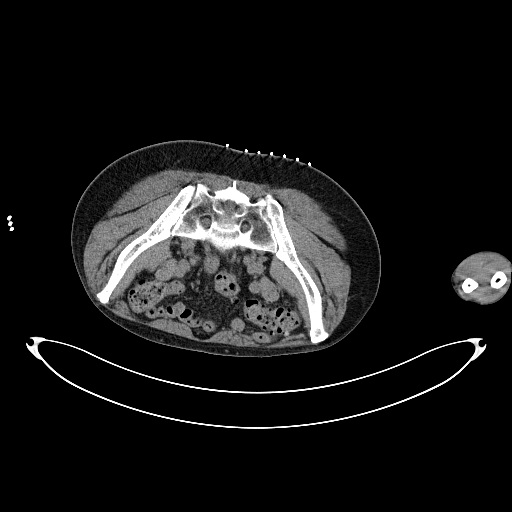
[im 8/22  lung]
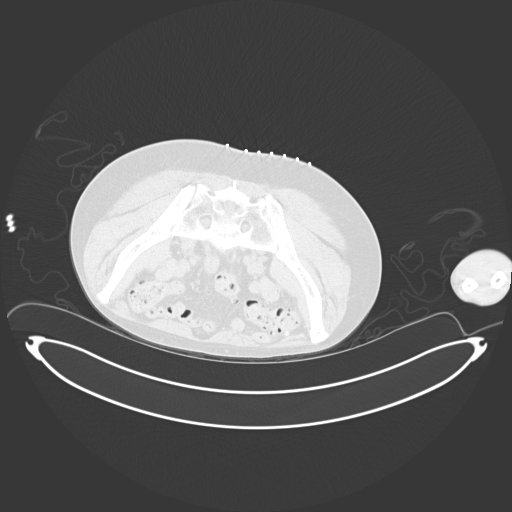
[im 9/22  lung]
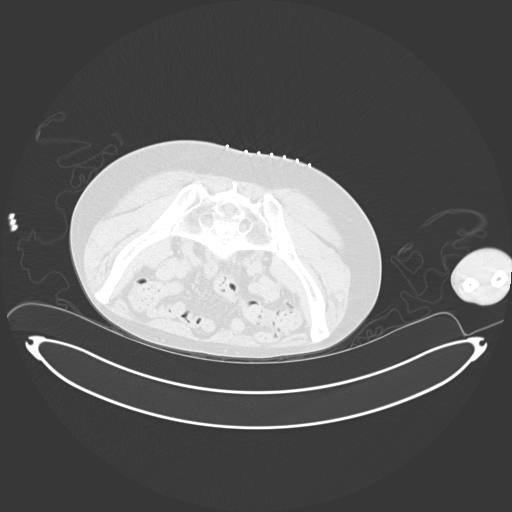
[im 10/22  lung]
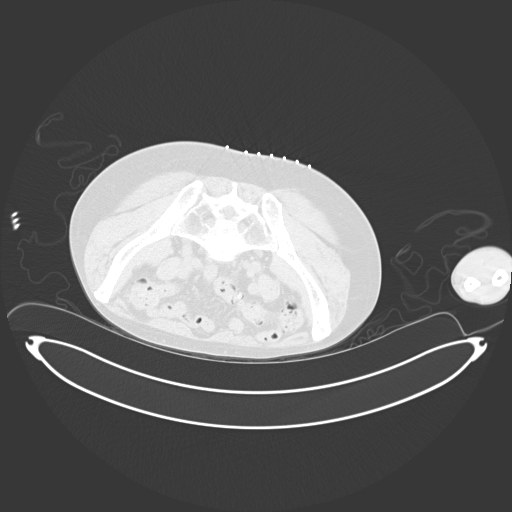
[im 12/22  lung]
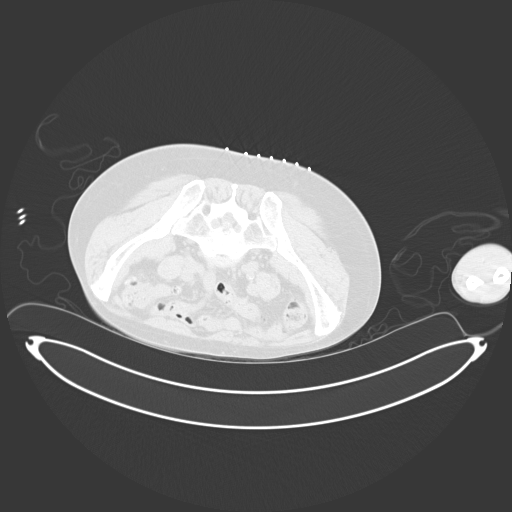
[im 13/22  mediastinal]
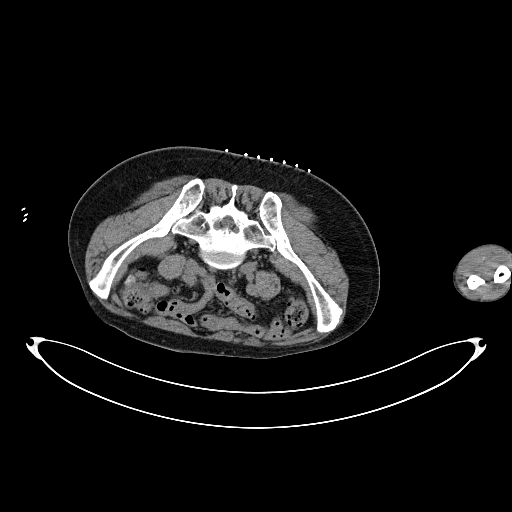
[im 13/22  lung]
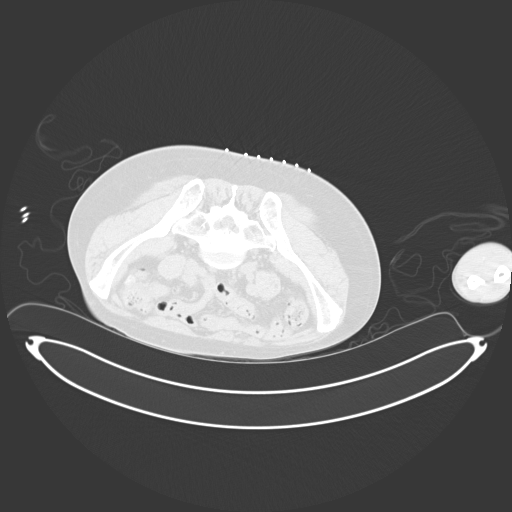
[im 14/22  lung]
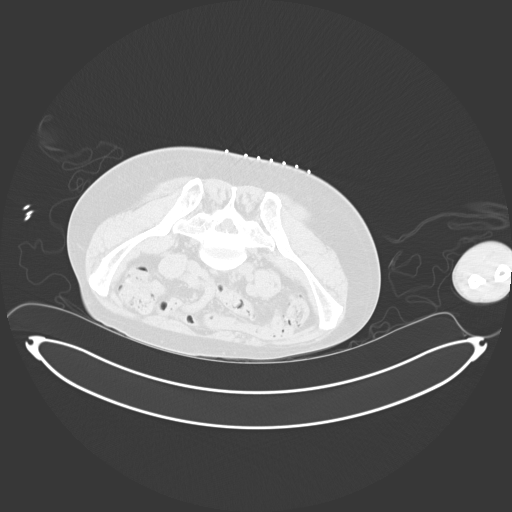
[im 16/22  lung]
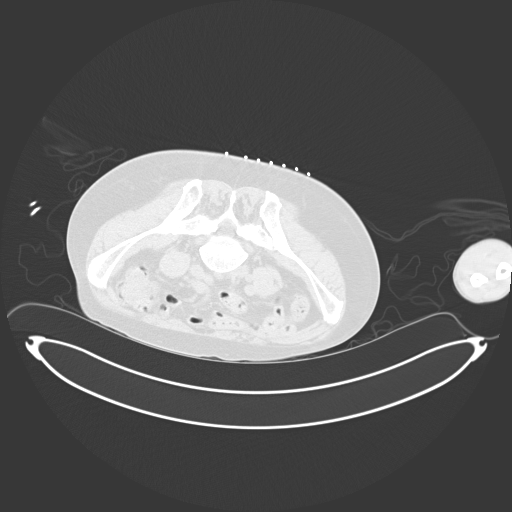
[im 17/22  lung]
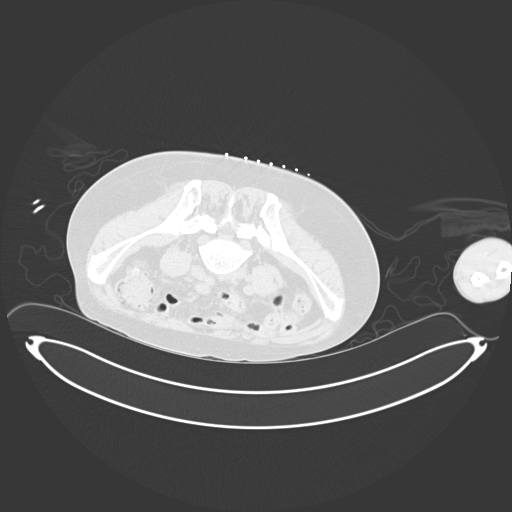
[im 18/22  mediastinal]
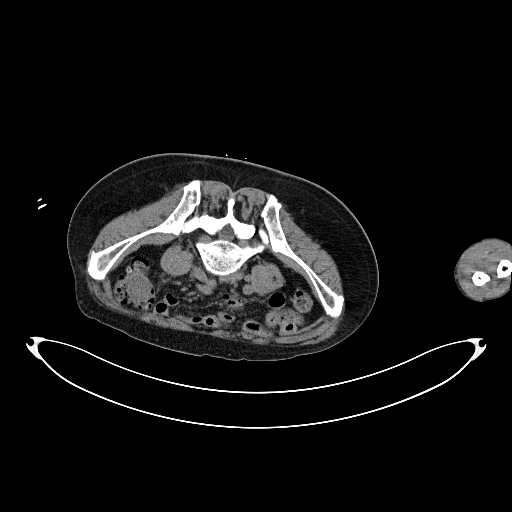
[im 18/22  lung]
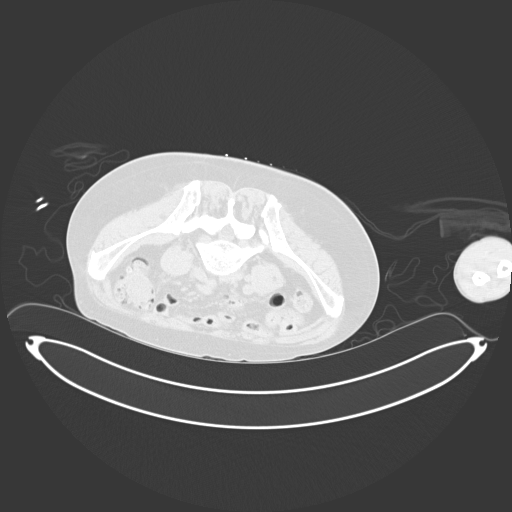
[im 20/22  lung]
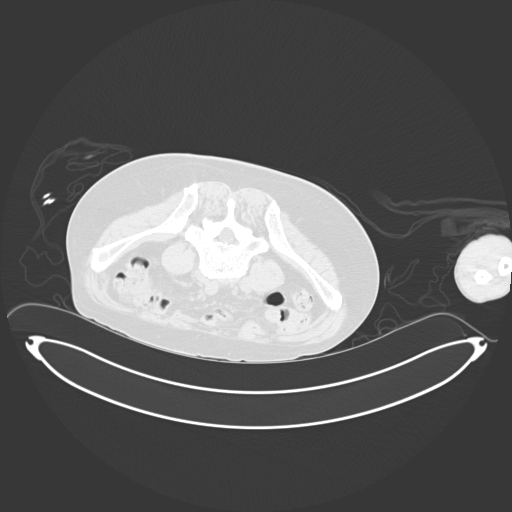
[im 21/22  lung]
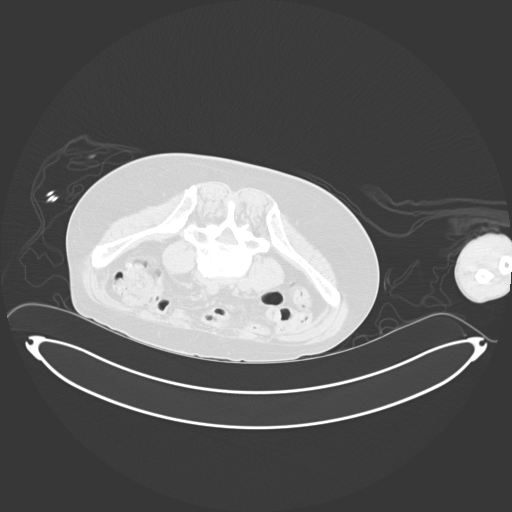

[15 of 28 positions shown; findings below may reference images not displayed]

EXAM:
CT-GUIDED BONE MARROW BIOPSY AND ASPIRATION

MEDICATIONS:
None

ANESTHESIA/SEDATION:
Fentanyl 100 mcg IV; Versed 2 mg IV

Sedation Time: 10 Minutes; The patient was continuously monitored
during the procedure by the interventional radiology nurse under my
direct supervision.

COMPLICATIONS:
None immediate.

PROCEDURE:
Informed consent was obtained from the patient following an
explanation of the procedure, risks, benefits and alternatives. The
patient understands, agrees and consents for the procedure. All
questions were addressed. A time out was performed prior to the
initiation of the procedure. The patient was positioned prone and
non-contrast localization CT was performed of the pelvis to
demonstrate the iliac marrow spaces. The operative site was prepped
and draped in the usual sterile fashion.

Under sterile conditions and local anesthesia, a 22 gauge spinal
needle was utilized for procedural planning. Next, an 11 gauge
coaxial bone biopsy needle was advanced into the left iliac marrow
space. Needle position was confirmed with CT imaging. Initially,
bone marrow aspiration was performed. Next, a bone marrow biopsy was
obtained with the 11 gauge outer bone marrow device. Samples were
prepared with the cytotechnologist and deemed adequate. The needle
was removed intact. Hemostasis was obtained with compression and a
dressing was placed. The patient tolerated the procedure well
without immediate post procedural complication.
IMPRESSION: Successful CT guided left iliac bone marrow aspiration and core
biopsy.

## 2020-10-03 IMAGING — CT CT ANGIO CHEST
2 of 6 series · 18 of 36 positions shown · IV contrast (omnipaque)
Comparison: None.

CLINICAL DATA: Shortness of breath with history of multiple
myeloma. Elevated D-dimer. Severe thrombocytopenia and anemia.

EXAM:
CT ANGIOGRAPHY CHEST WITH CONTRAST
TECHNIQUE: Multidetector CT imaging of the chest was performed using the
standard protocol during bolus administration of intravenous
contrast. Multiplanar CT image reconstructions and MIPs were
obtained to evaluate the vascular anatomy.
CONTRAST:  55mL OMNIPAQUE IOHEXOL 350 MG/ML SOLN

[Series 5: thins · axial · 0.63mm/px · z∈[+1548,+1766]mm · 17 of 246 slices shown]
[im 14/246  lung]
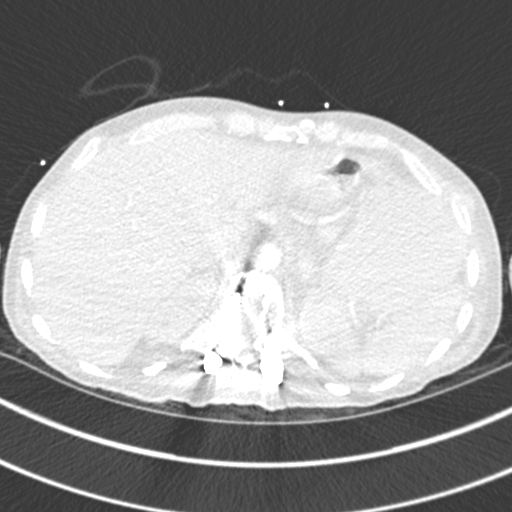
[im 28/246  mediastinal]
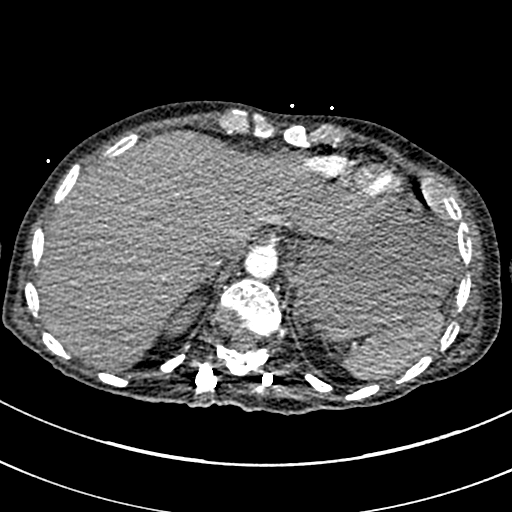
[im 41/246  lung]
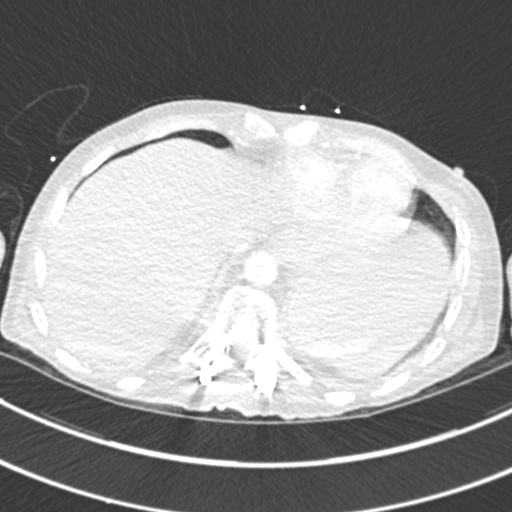
[im 55/246  mediastinal]
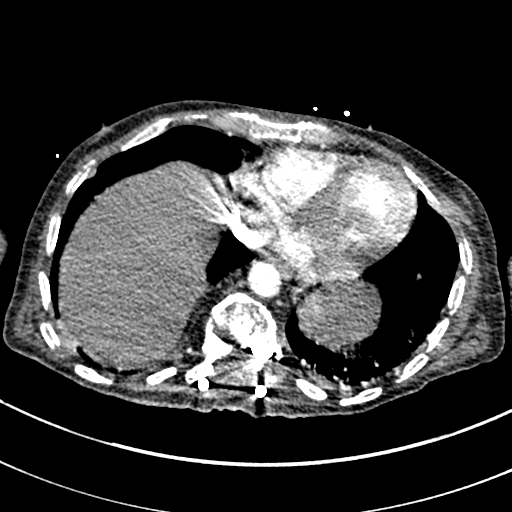
[im 69/246  lung]
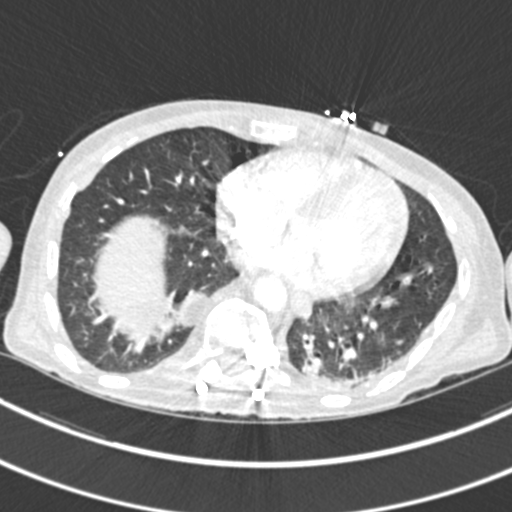
[im 82/246  mediastinal]
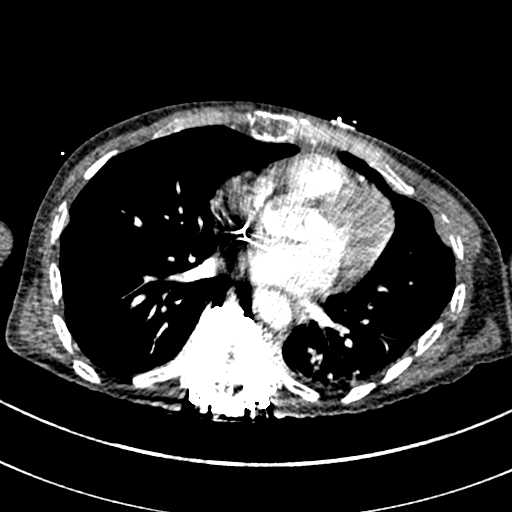
[im 96/246  lung]
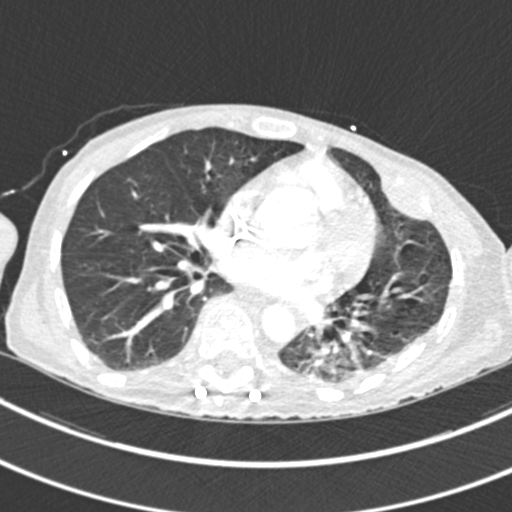
[im 109/246  mediastinal]
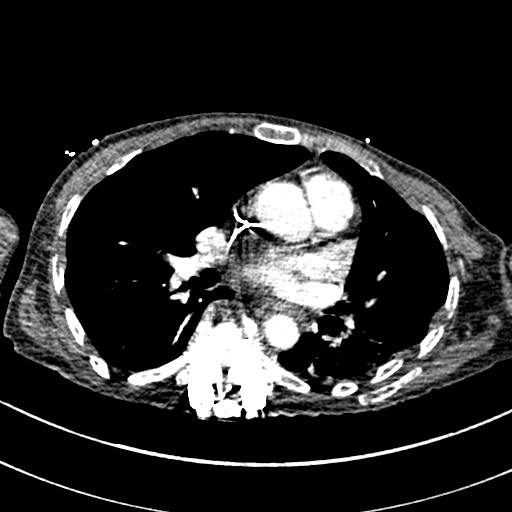
[im 123/246  lung]
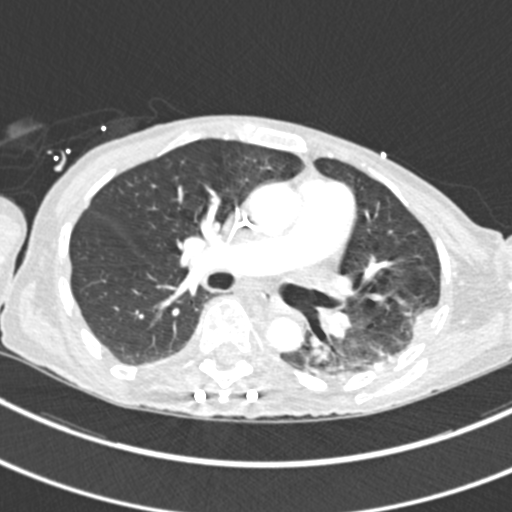
[im 137/246  mediastinal]
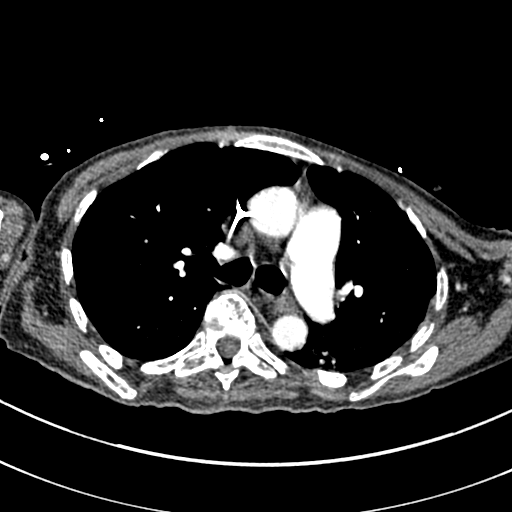
[im 150/246  lung]
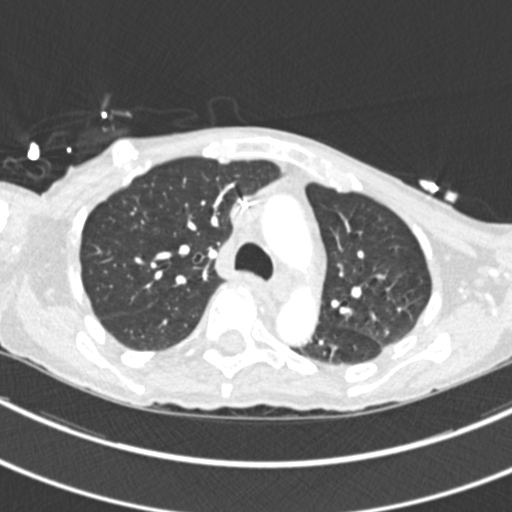
[im 164/246  mediastinal]
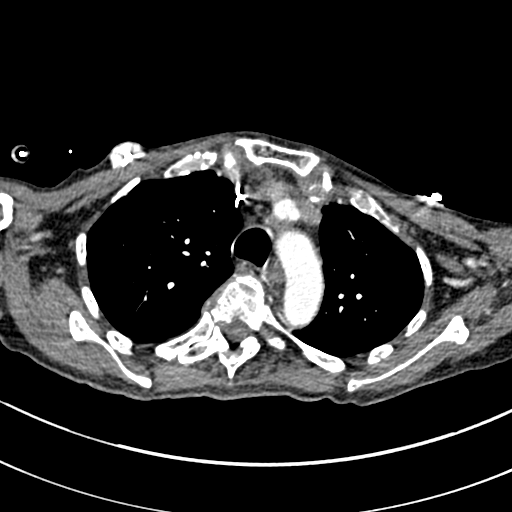
[im 177/246  lung]
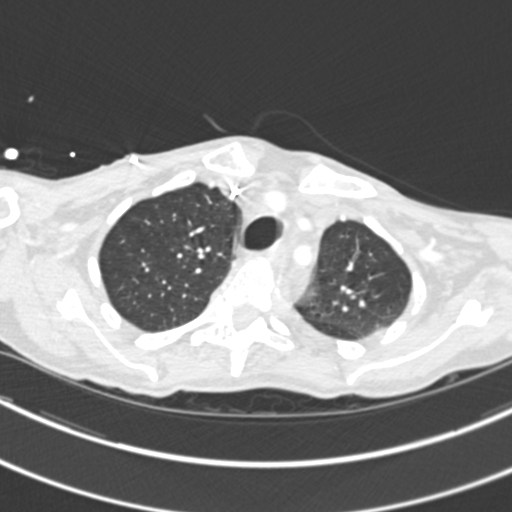
[im 191/246  mediastinal]
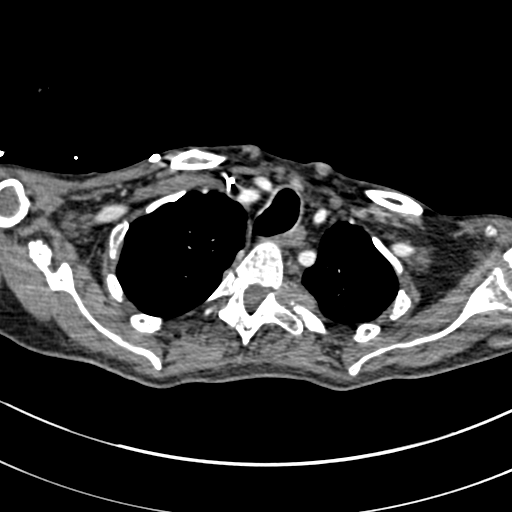
[im 205/246  lung]
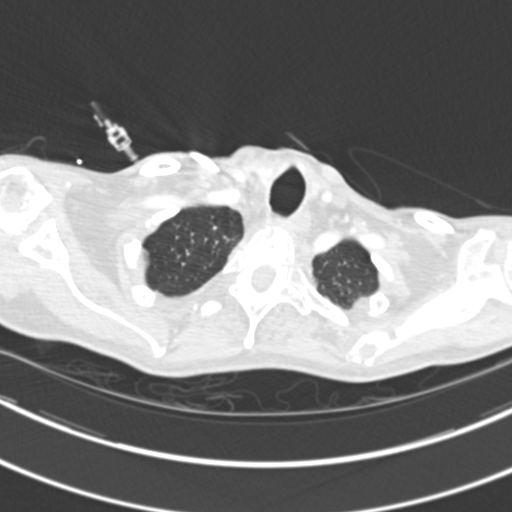
[im 218/246  mediastinal]
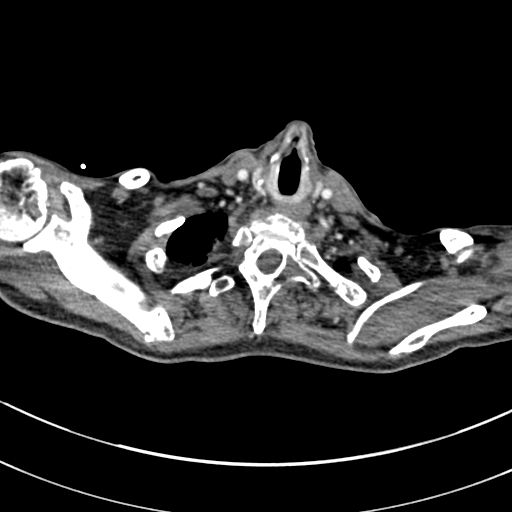
[im 232/246  lung]
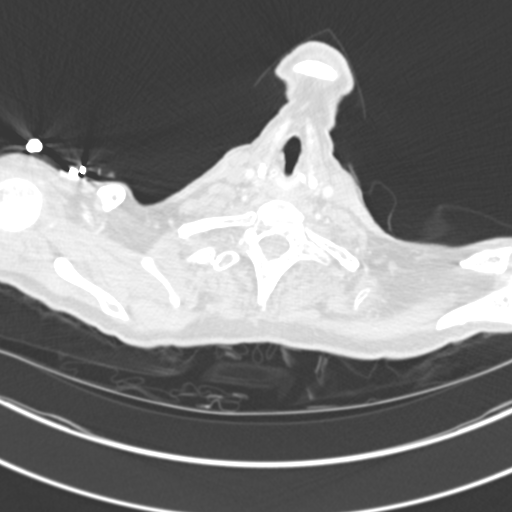

[Series 7: coronal mpr · coronal · 0.52mm/px · 1 of 103 slices shown]
[im 52/103  mediastinal]
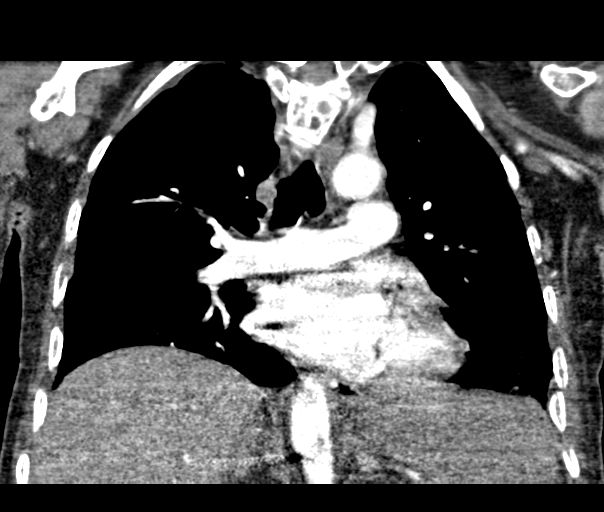

[18 of 36 positions shown; findings below may reference images not displayed]

FINDINGS: Cardiovascular: Evaluation for pulmonary emboli is limited by
significant respiratory motion artifact.Given this limitation, no
large centrally located pulmonary embolism was detected. Detection
of smaller pulmonary emboli is limited. The main pulmonary artery is
within normal limits for size. There is no CT evidence of acute
right heart strain. There is no evidence for thoracic aortic
aneurysm or dissection. Aortic calcifications are noted. Heart size
is normal. There is no significant pericardial effusion. There is a
well-positioned right-sided Port-A-Cath in place.

Mediastinum/Nodes:

--No mediastinal or hilar lymphadenopathy.

--No axillary lymphadenopathy.

--No supraclavicular lymphadenopathy.

--Normal thyroid gland.

--The esophagus is unremarkable

Lungs/Pleura: There is no pneumothorax. No large pleural effusion.
Atelectasis is noted at the lung bases, left worse than right. The
trachea is unremarkable.

Upper Abdomen: No acute abnormality.

Musculoskeletal: There are extensive lytic lesions scattered
throughout the visualized osseous structures. Multiple lytic soft
tissue masses are noted involving the ribs, greatest on the left.
There is age-indeterminate height loss of the T5 vertebral body. The
patient is status post posterior fusion of the mid to lower thoracic
spine. A large lytic lesion is noted in the T10 vertebral body. The
hardware appears grossly intact.

Review of the MIP images confirms the above findings.
IMPRESSION: 1. Evaluation for pulmonary emboli is limited by respiratory motion
artifact. Given this limitation, no PE was identified.
2. Bibasilar atelectasis, otherwise the lungs are essentially clear.
3. Extensive lytic lesions involving the osseous structures with
multiple lytic soft tissue masses involving the ribs is consistent
with the patient's history of multiple myeloma

Aortic Atherosclerosis ([id number]).
